# Patient Record
Sex: Male | Born: 1935 | Race: White | Hispanic: No | Smoking: Never smoker
Health system: Southern US, Community
[De-identification: ages and names within clinical notes are randomized; demographics above are authoritative.]

## PROBLEM LIST (undated history)

## (undated) DIAGNOSIS — I480 Paroxysmal atrial fibrillation: Secondary | ICD-10-CM

## (undated) DIAGNOSIS — K219 Gastro-esophageal reflux disease without esophagitis: Secondary | ICD-10-CM

## (undated) DIAGNOSIS — I2510 Atherosclerotic heart disease of native coronary artery without angina pectoris: Secondary | ICD-10-CM

## (undated) DIAGNOSIS — Z860100 Personal history of colon polyps, unspecified: Secondary | ICD-10-CM

## (undated) DIAGNOSIS — E785 Hyperlipidemia, unspecified: Secondary | ICD-10-CM

## (undated) DIAGNOSIS — K5730 Diverticulosis of large intestine without perforation or abscess without bleeding: Secondary | ICD-10-CM

## (undated) DIAGNOSIS — N200 Calculus of kidney: Secondary | ICD-10-CM

## (undated) DIAGNOSIS — I10 Essential (primary) hypertension: Secondary | ICD-10-CM

## (undated) DIAGNOSIS — K649 Unspecified hemorrhoids: Secondary | ICD-10-CM

## (undated) DIAGNOSIS — C4339 Malignant melanoma of other parts of face: Secondary | ICD-10-CM

## (undated) HISTORY — PX: CATARACT EXTRACTION W/ INTRAOCULAR LENS  IMPLANT, BILATERAL: SHX1307

## (undated) HISTORY — DX: Paroxysmal atrial fibrillation: I48.0

## (undated) HISTORY — DX: Unspecified hemorrhoids: K64.9

## (undated) HISTORY — DX: Personal history of colonic polyps: Z86.010

## (undated) HISTORY — PX: CYSTOSCOPY/RETROGRADE/URETEROSCOPY/STONE EXTRACTION WITH BASKET: SHX5317

## (undated) HISTORY — DX: Gastro-esophageal reflux disease without esophagitis: K21.9

## (undated) HISTORY — PX: MELANOMA EXCISION: SHX5266

## (undated) HISTORY — PX: BLADDER SURGERY: SHX569

## (undated) HISTORY — DX: Calculus of kidney: N20.0

## (undated) HISTORY — DX: Hyperlipidemia, unspecified: E78.5

## (undated) HISTORY — PX: VASECTOMY: SHX75

## (undated) HISTORY — DX: Diverticulosis of large intestine without perforation or abscess without bleeding: K57.30

## (undated) HISTORY — DX: Essential (primary) hypertension: I10

## (undated) HISTORY — DX: Personal history of colon polyps, unspecified: Z86.0100

---

## 1990-02-06 HISTORY — PX: LAPAROSCOPIC CHOLECYSTECTOMY: SUR755

## 1990-04-08 HISTORY — PX: INGUINAL HERNIA REPAIR: SUR1180

## 2000-02-07 ENCOUNTER — Encounter: Payer: Self-pay | Admitting: Emergency Medicine

## 2000-02-07 ENCOUNTER — Emergency Department (HOSPITAL_COMMUNITY): Admission: EM | Admit: 2000-02-07 | Discharge: 2000-02-07 | Payer: Self-pay | Admitting: Emergency Medicine

## 2000-02-18 ENCOUNTER — Ambulatory Visit (HOSPITAL_COMMUNITY): Admission: RE | Admit: 2000-02-18 | Discharge: 2000-02-18 | Payer: Self-pay | Admitting: Urology

## 2000-02-18 ENCOUNTER — Encounter: Payer: Self-pay | Admitting: Urology

## 2000-03-15 ENCOUNTER — Inpatient Hospital Stay (HOSPITAL_COMMUNITY): Admission: EM | Admit: 2000-03-15 | Discharge: 2000-03-16 | Payer: Self-pay | Admitting: Emergency Medicine

## 2000-03-15 ENCOUNTER — Encounter: Payer: Self-pay | Admitting: Urology

## 2001-07-04 ENCOUNTER — Other Ambulatory Visit: Admission: RE | Admit: 2001-07-04 | Discharge: 2001-07-04 | Payer: Self-pay | Admitting: Gastroenterology

## 2001-07-04 ENCOUNTER — Encounter (INDEPENDENT_AMBULATORY_CARE_PROVIDER_SITE_OTHER): Payer: Self-pay | Admitting: *Deleted

## 2001-08-04 ENCOUNTER — Encounter: Payer: Self-pay | Admitting: Family Medicine

## 2001-08-04 ENCOUNTER — Ambulatory Visit (HOSPITAL_COMMUNITY): Admission: RE | Admit: 2001-08-04 | Discharge: 2001-08-04 | Payer: Self-pay | Admitting: Family Medicine

## 2001-10-27 ENCOUNTER — Emergency Department (HOSPITAL_COMMUNITY): Admission: EM | Admit: 2001-10-27 | Discharge: 2001-10-27 | Payer: Self-pay | Admitting: Emergency Medicine

## 2002-06-05 ENCOUNTER — Encounter: Payer: Self-pay | Admitting: Gastroenterology

## 2002-06-05 ENCOUNTER — Ambulatory Visit (HOSPITAL_COMMUNITY): Admission: RE | Admit: 2002-06-05 | Discharge: 2002-06-05 | Payer: Self-pay | Admitting: Gastroenterology

## 2003-10-14 ENCOUNTER — Encounter (INDEPENDENT_AMBULATORY_CARE_PROVIDER_SITE_OTHER): Payer: Self-pay | Admitting: Specialist

## 2003-10-14 ENCOUNTER — Ambulatory Visit (HOSPITAL_BASED_OUTPATIENT_CLINIC_OR_DEPARTMENT_OTHER): Admission: RE | Admit: 2003-10-14 | Discharge: 2003-10-14 | Payer: Self-pay | Admitting: Urology

## 2003-10-14 ENCOUNTER — Ambulatory Visit (HOSPITAL_COMMUNITY): Admission: RE | Admit: 2003-10-14 | Discharge: 2003-10-14 | Payer: Self-pay | Admitting: Urology

## 2004-02-07 ENCOUNTER — Emergency Department (HOSPITAL_COMMUNITY): Admission: EM | Admit: 2004-02-07 | Discharge: 2004-02-07 | Payer: Self-pay | Admitting: Emergency Medicine

## 2004-02-22 ENCOUNTER — Encounter: Admission: RE | Admit: 2004-02-22 | Discharge: 2004-02-22 | Payer: Self-pay | Admitting: Family Medicine

## 2005-07-15 ENCOUNTER — Ambulatory Visit: Payer: Self-pay | Admitting: Family Medicine

## 2005-08-26 ENCOUNTER — Ambulatory Visit: Payer: Self-pay | Admitting: Cardiology

## 2005-09-27 ENCOUNTER — Ambulatory Visit: Payer: Self-pay

## 2005-11-19 ENCOUNTER — Ambulatory Visit: Payer: Self-pay | Admitting: Family Medicine

## 2006-02-28 ENCOUNTER — Ambulatory Visit: Payer: Self-pay | Admitting: Family Medicine

## 2006-03-08 ENCOUNTER — Ambulatory Visit: Payer: Self-pay | Admitting: Family Medicine

## 2006-03-16 ENCOUNTER — Ambulatory Visit: Payer: Self-pay | Admitting: Family Medicine

## 2006-05-17 ENCOUNTER — Inpatient Hospital Stay (HOSPITAL_COMMUNITY): Admission: EM | Admit: 2006-05-17 | Discharge: 2006-05-17 | Payer: Self-pay | Admitting: Emergency Medicine

## 2006-05-17 ENCOUNTER — Ambulatory Visit: Payer: Self-pay | Admitting: *Deleted

## 2006-05-24 ENCOUNTER — Ambulatory Visit: Payer: Self-pay | Admitting: Cardiology

## 2006-07-18 ENCOUNTER — Ambulatory Visit: Payer: Self-pay | Admitting: Family Medicine

## 2006-08-25 ENCOUNTER — Ambulatory Visit: Payer: Self-pay | Admitting: Cardiology

## 2007-03-18 ENCOUNTER — Emergency Department (HOSPITAL_COMMUNITY): Admission: EM | Admit: 2007-03-18 | Discharge: 2007-03-18 | Payer: Self-pay | Admitting: Emergency Medicine

## 2007-03-20 ENCOUNTER — Ambulatory Visit: Payer: Self-pay | Admitting: Cardiology

## 2007-06-05 ENCOUNTER — Ambulatory Visit: Payer: Self-pay | Admitting: Internal Medicine

## 2007-06-15 ENCOUNTER — Encounter: Payer: Self-pay | Admitting: Internal Medicine

## 2007-06-15 ENCOUNTER — Ambulatory Visit: Payer: Self-pay | Admitting: Internal Medicine

## 2007-07-04 DIAGNOSIS — J309 Allergic rhinitis, unspecified: Secondary | ICD-10-CM | POA: Insufficient documentation

## 2007-07-04 DIAGNOSIS — E785 Hyperlipidemia, unspecified: Secondary | ICD-10-CM | POA: Insufficient documentation

## 2007-07-04 DIAGNOSIS — I10 Essential (primary) hypertension: Secondary | ICD-10-CM | POA: Insufficient documentation

## 2007-08-01 ENCOUNTER — Ambulatory Visit: Payer: Self-pay | Admitting: Family Medicine

## 2007-08-01 DIAGNOSIS — N138 Other obstructive and reflux uropathy: Secondary | ICD-10-CM | POA: Insufficient documentation

## 2007-08-01 DIAGNOSIS — N401 Enlarged prostate with lower urinary tract symptoms: Secondary | ICD-10-CM

## 2007-08-01 DIAGNOSIS — Z86010 Personal history of colonic polyps: Secondary | ICD-10-CM | POA: Insufficient documentation

## 2007-08-01 DIAGNOSIS — K219 Gastro-esophageal reflux disease without esophagitis: Secondary | ICD-10-CM | POA: Insufficient documentation

## 2007-08-01 DIAGNOSIS — Z87442 Personal history of urinary calculi: Secondary | ICD-10-CM | POA: Insufficient documentation

## 2007-08-14 ENCOUNTER — Encounter: Payer: Self-pay | Admitting: Family Medicine

## 2007-09-11 ENCOUNTER — Ambulatory Visit: Payer: Self-pay | Admitting: Cardiology

## 2007-12-08 ENCOUNTER — Ambulatory Visit: Payer: Self-pay | Admitting: Cardiology

## 2008-05-14 ENCOUNTER — Ambulatory Visit: Payer: Self-pay | Admitting: Cardiology

## 2008-07-16 ENCOUNTER — Ambulatory Visit: Payer: Self-pay | Admitting: Family Medicine

## 2008-08-01 ENCOUNTER — Ambulatory Visit: Payer: Self-pay | Admitting: Family Medicine

## 2008-09-03 ENCOUNTER — Encounter: Payer: Self-pay | Admitting: Family Medicine

## 2009-01-18 ENCOUNTER — Ambulatory Visit: Payer: Self-pay | Admitting: Family Medicine

## 2009-01-18 DIAGNOSIS — J069 Acute upper respiratory infection, unspecified: Secondary | ICD-10-CM | POA: Insufficient documentation

## 2009-07-02 ENCOUNTER — Ambulatory Visit: Payer: Self-pay | Admitting: Cardiology

## 2009-07-02 DIAGNOSIS — I6529 Occlusion and stenosis of unspecified carotid artery: Secondary | ICD-10-CM | POA: Insufficient documentation

## 2009-07-04 ENCOUNTER — Encounter: Payer: Self-pay | Admitting: Cardiology

## 2009-07-04 ENCOUNTER — Ambulatory Visit: Payer: Self-pay

## 2009-08-11 ENCOUNTER — Ambulatory Visit: Payer: Self-pay | Admitting: Family Medicine

## 2009-08-14 ENCOUNTER — Ambulatory Visit: Payer: Self-pay | Admitting: Family Medicine

## 2009-08-19 LAB — CONVERTED CEMR LAB
AST: 22 units/L (ref 0–37)
Alkaline Phosphatase: 54 units/L (ref 39–117)
Basophils Absolute: 0 10*3/uL (ref 0.0–0.1)
Calcium: 9.4 mg/dL (ref 8.4–10.5)
GFR calc non Af Amer: 68.79 mL/min (ref >60)
Glucose, Bld: 101 mg/dL — ABNORMAL HIGH (ref 70–99)
HDL: 35.5 mg/dL — ABNORMAL LOW (ref >39.00)
Hemoglobin: 13.5 g/dL (ref 13.0–17.0)
LDL Cholesterol: 85 mg/dL (ref 0–99)
Lymphocytes Relative: 24.7 % (ref 12.0–46.0)
Monocytes Relative: 9.9 % (ref 3.0–12.0)
Neutro Abs: 4.3 10*3/uL (ref 1.4–7.7)
Platelets: 180 10*3/uL (ref 150.0–400.0)
RDW: 12.9 % (ref 11.5–14.6)
Sodium: 146 meq/L — ABNORMAL HIGH (ref 135–145)
Total Bilirubin: 0.8 mg/dL (ref 0.3–1.2)
VLDL: 12.6 mg/dL (ref 0.0–40.0)
WBC: 7 10*3/uL (ref 4.5–10.5)

## 2009-09-18 ENCOUNTER — Encounter (INDEPENDENT_AMBULATORY_CARE_PROVIDER_SITE_OTHER): Payer: Self-pay | Admitting: *Deleted

## 2009-12-19 ENCOUNTER — Encounter: Payer: Self-pay | Admitting: Family Medicine

## 2010-07-06 ENCOUNTER — Encounter: Payer: Self-pay | Admitting: Cardiology

## 2010-07-08 ENCOUNTER — Ambulatory Visit: Payer: Self-pay | Admitting: Cardiology

## 2010-08-17 ENCOUNTER — Ambulatory Visit: Payer: Self-pay | Admitting: Family Medicine

## 2010-08-18 LAB — CONVERTED CEMR LAB
Albumin: 4 g/dL (ref 3.5–5.2)
Alkaline Phosphatase: 48 units/L (ref 39–117)
Basophils Relative: 0.2 % (ref 0.0–3.0)
CO2: 34 meq/L — ABNORMAL HIGH (ref 19–32)
Calcium: 9.1 mg/dL (ref 8.4–10.5)
Eosinophils Absolute: 0.5 10*3/uL (ref 0.0–0.7)
Glucose, Bld: 94 mg/dL (ref 70–99)
HDL: 32.1 mg/dL — ABNORMAL LOW (ref >39.00)
Hemoglobin: 12.7 g/dL — ABNORMAL LOW (ref 13.0–17.0)
MCHC: 34 g/dL (ref 30.0–36.0)
MCV: 97 fL (ref 78.0–100.0)
Monocytes Absolute: 0.9 10*3/uL (ref 0.1–1.0)
Neutro Abs: 5.9 10*3/uL (ref 1.4–7.7)
RBC: 3.85 M/uL — ABNORMAL LOW (ref 4.22–5.81)
Sodium: 142 meq/L (ref 135–145)
Total Protein: 6.3 g/dL (ref 6.0–8.3)
Triglycerides: 143 mg/dL (ref 0.0–149.0)

## 2010-09-18 ENCOUNTER — Ambulatory Visit: Payer: Self-pay | Admitting: Family Medicine

## 2010-09-18 DIAGNOSIS — J209 Acute bronchitis, unspecified: Secondary | ICD-10-CM | POA: Insufficient documentation

## 2010-09-29 ENCOUNTER — Telehealth: Payer: Self-pay | Admitting: Cardiology

## 2010-10-29 ENCOUNTER — Emergency Department (HOSPITAL_COMMUNITY)
Admission: EM | Admit: 2010-10-29 | Discharge: 2010-10-29 | Payer: Self-pay | Source: Home / Self Care | Admitting: Emergency Medicine

## 2010-12-06 LAB — CONVERTED CEMR LAB
ALT: 23 units/L (ref 0–53)
ALT: 26 units/L (ref 0–53)
AST: 22 units/L (ref 0–37)
AST: 23 units/L (ref 0–37)
Alkaline Phosphatase: 49 units/L (ref 39–117)
Alkaline Phosphatase: 61 units/L (ref 39–117)
BUN: 15 mg/dL (ref 6–23)
Basophils Absolute: 0 10*3/uL (ref 0.0–0.1)
Basophils Relative: 0.6 % (ref 0.0–1.0)
Bilirubin, Direct: 0.1 mg/dL (ref 0.0–0.3)
CO2: 31 meq/L (ref 19–32)
CO2: 32 meq/L (ref 19–32)
Calcium: 9 mg/dL (ref 8.4–10.5)
Calcium: 9.5 mg/dL (ref 8.4–10.5)
Chloride: 102 meq/L (ref 96–112)
Chloride: 105 meq/L (ref 96–112)
Cholesterol: 145 mg/dL (ref 0–200)
Eosinophils Relative: 2.3 % (ref 0.0–5.0)
GFR calc Af Amer: 84 mL/min
GFR calc non Af Amer: 69 mL/min
Glucose, Bld: 100 mg/dL — ABNORMAL HIGH (ref 70–99)
Glucose, Bld: 113 mg/dL — ABNORMAL HIGH (ref 70–99)
HDL: 32 mg/dL — ABNORMAL LOW (ref >39.0)
LDL Cholesterol: 77 mg/dL (ref 0–99)
LDL Cholesterol: 92 mg/dL (ref 0–99)
Lymphocytes Relative: 26.4 % (ref 12.0–46.0)
Lymphocytes Relative: 26.8 % (ref 12.0–46.0)
MCHC: 34.4 g/dL (ref 30.0–36.0)
Monocytes Relative: 10.8 % (ref 3.0–11.0)
Monocytes Relative: 8.6 % (ref 3.0–12.0)
Neutro Abs: 5.1 10*3/uL (ref 1.4–7.7)
Neutrophils Relative %: 60.8 % (ref 43.0–77.0)
Platelets: 175 10*3/uL (ref 150–400)
Platelets: 177 10*3/uL (ref 150–400)
Potassium: 4.2 meq/L (ref 3.5–5.1)
RBC: 4.2 M/uL — ABNORMAL LOW (ref 4.22–5.81)
RDW: 12.8 % (ref 11.5–14.6)
Sodium: 144 meq/L (ref 135–145)
TSH: 1.11 microintl units/mL (ref 0.35–5.50)
Total Bilirubin: 0.9 mg/dL (ref 0.3–1.2)
Total CHOL/HDL Ratio: 3.7
Total CHOL/HDL Ratio: 4.2
Total Protein: 6.5 g/dL (ref 6.0–8.3)
Triglycerides: 127 mg/dL (ref 0–149)
Triglycerides: 67 mg/dL (ref 0–149)
VLDL: 13 mg/dL (ref 0–40)
VLDL: 25 mg/dL (ref 0–40)
WBC: 8.5 10*3/uL (ref 4.5–10.5)

## 2010-12-08 NOTE — Miscellaneous (Signed)
  Clinical Lists Changes  Observations: Added new observation of US CAROTID: NOrmal carotid artery disease, bilaterally (07/04/2009 9:21)      Carotid Doppler  Procedure date:  07/04/2009  Findings:      NOrmal carotid artery disease, bilaterally

## 2010-12-08 NOTE — Assessment & Plan Note (Signed)
Summary: COUGH, CONGESTION // RS   Vital Signs:  Patient profile:   77 year old male Weight:      212 pounds O2 Sat:      95 % Temp:     98.1 degrees F Pulse rate:   62 / minute BP sitting:   120 / 72  (left arm)  Vitals Entered By: Pura Spice, RN (September 18, 2010 9:18 AM) CC: congestion  cough productive    History of Present Illness: Here for one week of PND, chest congestion, and coughing up green sputum. No fever. ON Mucinex.  Allergies: 1)  ! Sulfamethoxazole (Sulfamethoxazole)  Past History:  Past Medical History: Reviewed history from 08/17/2010 and no changes required. Allergic rhinitis Hyperlipidemia Hypertension low back pain (Dr. Gerlene Fee) paroxysmal Atrial fibrillation (cardiologist is Dr. Antoine Poche) GERD Glaucoma (sees Dr. Nile Riggs) sees Dr. Lonni Fix for dermatology checks sees Dr. Vonita Moss for urologic exams Colonic polyps, hx  Nephrolithiasis, hx of  Past Surgical History: Reviewed history from 08/17/2010 and no changes required. Cholecystectomy Vasectomy Rt hernia repaired, inguinal Laser surgery both  eyes (catarracts out)  Ruptured blood vessel in the bladder Removed 2 stones left  ureter Repair blood vessel in bladder colonoscopy 06-15-07 per Dr. Leone Payor, repeat in 5 yrs  Review of Systems  The patient denies anorexia, fever, weight loss, weight gain, vision loss, decreased hearing, hoarseness, chest pain, syncope, dyspnea on exertion, peripheral edema, headaches, hemoptysis, abdominal pain, melena, hematochezia, severe indigestion/heartburn, hematuria, incontinence, genital sores, muscle weakness, suspicious skin lesions, transient blindness, difficulty walking, depression, unusual weight change, abnormal bleeding, enlarged lymph nodes, angioedema, breast masses, and testicular masses.    Physical Exam  General:  Well-developed,well-nourished,in no acute distress; alert,appropriate and cooperative throughout examination Head:  Normocephalic and  atraumatic without obvious abnormalities. No apparent alopecia or balding. Eyes:  No corneal or conjunctival inflammation noted. EOMI. Perrla. Funduscopic exam benign, without hemorrhages, exudates or papilledema. Vision grossly normal. Ears:  External ear exam shows no significant lesions or deformities.  Otoscopic examination reveals clear canals, tympanic membranes are intact bilaterally without bulging, retraction, inflammation or discharge. Hearing is grossly normal bilaterally. Nose:  External nasal examination shows no deformity or inflammation. Nasal mucosa are pink and moist without lesions or exudates. Mouth:  Oral mucosa and oropharynx without lesions or exudates.  Teeth in good repair. Neck:  No deformities, masses, or tenderness noted. Lungs:  scattered rhonchi   Impression & Recommendations:  Problem # 1:  ACUTE BRONCHITIS (ICD-466.0)  His updated medication list for this problem includes:    Zithromax Z-pak 250 Mg Tabs (Azithromycin) .Marland Kitchen... As directed  Complete Medication List: 1)  Travatan 0.004 % Soln (Travoprost) .Marland Kitchen.. 1 drop in each eye once daily 2)  Metoprolol Tartrate 50 Mg Tabs (Metoprolol tartrate) .... Two times a day 3)  Simvastatin 40 Mg Tabs (Simvastatin) .... Take 1 tablet by mouth once a day 4)  Loratadine 10 Mg Tabs (Loratadine) .... Take 1 tablet by mouth once a day 5)  Lisinopril 10 Mg Tabs (Lisinopril) .... Take 1 tablet by mouth once a day 6)  K-tabs 10 Meq Tbcr (Potassium chloride) .... Take 1 tablet by mouth once a day 7)  Aspirin 325 Mg Tbec (Aspirin) .... Once daily 8)  Centrum Silver Tabs (Multiple vitamins-minerals) .... Once daily 9)  Metamucil 30.9 % Powd (Psyllium) .... Two times a day 10)  Omeprazole 20 Mg Tbec (Omeprazole) .... 2 once daily 11)  Flonase 50 Mcg/act Susp (Fluticasone propionate) .... Once daily 12)  Zyrtec  Allergy 10 Mg Caps (Cetirizine hcl) 13)  Zithromax Z-pak 250 Mg Tabs (Azithromycin) .... As directed  Patient  Instructions: 1)  Please schedule a follow-up appointment as needed .  Prescriptions: ZITHROMAX Z-PAK 250 MG TABS (AZITHROMYCIN) as directed  #1 x 0   Entered and Authorized by:   Nelwyn Salisbury MD   Signed by:   Nelwyn Salisbury MD on 09/18/2010   Method used:   Electronically to        [facility]* (retail)       [address] [place], [place]  [address]       Ph: [phone] or [phone]       Fax: [phone]   RxID:   [id number]    Orders Added: 1)  Est. Patient Level IV [10932]

## 2010-12-08 NOTE — Assessment & Plan Note (Signed)
Summary: EMP/PT FASTING/CJR   Vital Signs:  Patient profile:   77 year old male Height:      70.5 inches Weight:      216 pounds BMI:     30.67 O2 Sat:      95 % Temp:     98.7 degrees F Pulse rate:   71 / minute BP sitting:   120 / 70  (left arm)  Vitals Entered By: Pura Spice, RN (August 17, 2010 8:56 AM) CC: cpx no problems  Is Patient Diabetic? No   History of Present Illness: 77 yr old male for a cpx. He feels fine and has no concerns. He saw Dr. Antoine Poche in August and got a good report. He had a single 5 hour run of what was probably atrial fib in early August, but none since. He remains active. he plans to see Dr. Vonita Moss next month.   Allergies: 1)  ! Sulfamethoxazole (Sulfamethoxazole)  Past History:  Past Medical History: Allergic rhinitis Hyperlipidemia Hypertension low back pain (Dr. Gerlene Fee) paroxysmal Atrial fibrillation (cardiologist is Dr. Antoine Poche) GERD Glaucoma (sees Dr. Nile Riggs) sees Dr. Lonni Fix for dermatology checks sees Dr. Vonita Moss for urologic exams Colonic polyps, hx  Nephrolithiasis, hx of  Past Surgical History: Cholecystectomy Vasectomy Rt hernia repaired, inguinal Laser surgery both  eyes (catarracts out)  Ruptured blood vessel in the bladder Removed 2 stones left  ureter Repair blood vessel in bladder colonoscopy 06-15-07 per Dr. Leone Payor, repeat in 5 yrs  Past History:  Care Management: Cardiology: Dr Antoine Poche  Dermatology: Dr Terri Piedra Urology: Dr Vonita Moss Ophthalmology: Dr Nile Riggs Gastroenterology: Dr Leone Payor  Family History: Reviewed history from 07/02/2009 and no changes required.  Father died from a myocardial infarction at age of 77.  Otherwise there is no early history of coronary artery disease in hiis  family.  Social History: Reviewed history from 06/30/2009 and no changes required.  The patient lives in [place] with his wife. He has two  children.  He is retired.  He denies any tobacco, alcohol, or illicit  substance.  Review of Systems  The patient denies anorexia, fever, weight loss, weight gain, vision loss, decreased hearing, hoarseness, chest pain, syncope, dyspnea on exertion, peripheral edema, prolonged cough, headaches, hemoptysis, abdominal pain, melena, hematochezia, severe indigestion/heartburn, hematuria, incontinence, genital sores, muscle weakness, suspicious skin lesions, transient blindness, difficulty walking, depression, unusual weight change, abnormal bleeding, enlarged lymph nodes, angioedema, breast masses, and testicular masses.         Flu Vaccine Consent Questions     Do you have a history of severe allergic reactions to this vaccine? no    Any prior history of allergic reactions to egg and/or gelatin? no    Do you have a sensitivity to the preservative Thimersol? no    Do you have a past history of Guillan-Barre Syndrome? no    Do you currently have an acute febrile illness? no    Have you ever had a severe reaction to latex? no    Vaccine information given and explained to patient? yes    Are you currently pregnant? no    Lot Number:[id number]   Exp Date:05/08/2011   Site Given  Left Deltoid IM Pura Spice, RN  August 17, 2010 9:00 AM   Physical Exam  General:  overweight-appearing.   Head:  Normocephalic and atraumatic without obvious abnormalities. No apparent alopecia or balding. Eyes:  No corneal or conjunctival inflammation noted. EOMI. Perrla. Funduscopic exam benign, without hemorrhages, exudates or papilledema. Vision grossly  normal. Ears:  External ear exam shows no significant lesions or deformities.  Otoscopic examination reveals clear canals, tympanic membranes are intact bilaterally without bulging, retraction, inflammation or discharge. Hearing is grossly normal bilaterally. Nose:  External nasal examination shows no deformity or inflammation. Nasal mucosa are pink and moist without lesions or exudates. Mouth:  Oral mucosa and oropharynx without  lesions or exudates.  Teeth in good repair. Neck:  No deformities, masses, or tenderness noted. Chest Wall:  No deformities, masses, tenderness or gynecomastia noted. Lungs:  Normal respiratory effort, chest expands symmetrically. Lungs are clear to auscultation, no crackles or wheezes. Heart:  Normal rate and regular rhythm. S1 and S2 normal without gallop, murmur, click, rub or other extra sounds. Abdomen:  Bowel sounds positive,abdomen soft and non-tender without masses, organomegaly or hernias noted. Msk:  No deformity or scoliosis noted of thoracic or lumbar spine.   Pulses:  R and L carotid,radial,femoral,dorsalis pedis and posterior tibial pulses are full and equal bilaterally Extremities:  No clubbing, cyanosis, edema, or deformity noted with normal full range of motion of all joints.   Neurologic:  No cranial nerve deficits noted. Station and gait are normal. Plantar reflexes are down-going bilaterally. DTRs are symmetrical throughout. Sensory, motor and coordinative functions appear intact. Skin:  Intact without suspicious lesions or rashes Cervical Nodes:  No lymphadenopathy noted Axillary Nodes:  No palpable lymphadenopathy Inguinal Nodes:  No significant adenopathy Psych:  Cognition and judgment appear intact. Alert and cooperative with normal attention span and concentration. No apparent delusions, illusions, hallucinations   Impression & Recommendations:  Problem # 1:  CAROTID ARTERY STENOSIS (ICD-433.10)  His updated medication list for this problem includes:    Aspirin 325 Mg Tbec (Aspirin) ..... Once daily  Problem # 2:  BENIGN PROSTATIC HYPERTROPHY, HX OF (ICD-V13.8)  Orders: T-PSA Total ([address])  Problem # 3:  GERD (ICD-530.81)  His updated medication list for this problem includes:    Omeprazole 20 Mg Tbec (Omeprazole) .Marland Kitchen... 2 once daily  Problem # 4:  ATRIAL FIBRILLATION (ICD-427.31)  His updated medication list for this problem includes:    Metoprolol  Tartrate 50 Mg Tabs (Metoprolol tartrate) .Marland Kitchen..Marland Kitchen Two times a day    Aspirin 325 Mg Tbec (Aspirin) ..... Once daily  Problem # 5:  HYPERTENSION (ICD-401.9)  His updated medication list for this problem includes:    Metoprolol Tartrate 50 Mg Tabs (Metoprolol tartrate) .Marland Kitchen..Marland Kitchen Two times a day    Lisinopril 10 Mg Tabs (Lisinopril) .Marland Kitchen... Take 1 tablet by mouth once a day  Orders: UA Dipstick w/o Micro (automated)  (81003) Venipuncture (40981) TLB-Lipid Panel (80061-LIPID) TLB-BMP (Basic Metabolic Panel-BMET) (80048-METABOL) TLB-CBC Platelet - w/Differential (85025-CBCD) TLB-Hepatic/Liver Function Pnl (80076-HEPATIC) TLB-TSH (Thyroid Stimulating Hormone) (84443-TSH)  Problem # 6:  HYPERLIPIDEMIA (ICD-272.4)  His updated medication list for this problem includes:    Simvastatin 40 Mg Tabs (Simvastatin) .Marland Kitchen... Take 1 tablet by mouth once a day  Complete Medication List: 1)  Travatan 0.004 % Soln (Travoprost) .Marland Kitchen.. 1 drop in each eye once daily 2)  Metoprolol Tartrate 50 Mg Tabs (Metoprolol tartrate) .... Two times a day 3)  Simvastatin 40 Mg Tabs (Simvastatin) .... Take 1 tablet by mouth once a day 4)  Loratadine 10 Mg Tabs (Loratadine) .... Take 1 tablet by mouth once a day 5)  Lisinopril 10 Mg Tabs (Lisinopril) .... Take 1 tablet by mouth once a day 6)  K-tabs 10 Meq Tbcr (Potassium chloride) .... Take 1 tablet by mouth once a day 7)  Aspirin 325 Mg Tbec (Aspirin) .... Once daily 8)  Centrum Silver Tabs (Multiple vitamins-minerals) .... Once daily 9)  Metamucil 30.9 % Powd (Psyllium) .... Two times a day 10)  Omeprazole 20 Mg Tbec (Omeprazole) .... 2 once daily 11)  Flonase 50 Mcg/act Susp (Fluticasone propionate) .... Once daily 12)  Zyrtec Allergy 10 Mg Caps (Cetirizine hcl)  Other Orders: Flu Vaccine 54yrs + MEDICARE PATIENTS (V4259) Administration Flu vaccine - MCR (D6387)  Patient Instructions: 1)  get fasting labs. 2)  It is important that you exercise reguarly at least 20  minutes 5 times a week. If you develop chest pain, have severe difficulty breathing, or feel very tired, stop exercising immediately and seek medical attention.  3)  You need to lose weight. Consider a lower calorie diet and regular exercise.    Eye Exam  Last  Eye exam   July 2011 goes every 3 months  Dr Nile Riggs    Appended Document: Orders Update     Clinical Lists Changes  Observations: Added new observation of COMMENTS: Wynona Canes, CMA  August 17, 2010 11:27 AM  (08/17/2010 11:26) Added new observation of PH URINE: 5.0  (08/17/2010 11:26) Added new observation of SPEC GR URIN: 1.025  (08/17/2010 11:26) Added new observation of APPEARANCE U: Clear  (08/17/2010 11:26) Added new observation of UA COLOR: yellow  (08/17/2010 11:26) Added new observation of WBC DIPSTK U: negative  (08/17/2010 11:26) Added new observation of NITRITE URN: negative  (08/17/2010 11:26) Added new observation of UROBILINOGEN: 0.2  (08/17/2010 11:26) Added new observation of PROTEIN, URN: negative  (08/17/2010 11:26) Added new observation of BLOOD UR DIP: trace-lysed  (08/17/2010 11:26) Added new observation of KETONES URN: negative  (08/17/2010 11:26) Added new observation of BILIRUBIN UR: negative  (08/17/2010 11:26) Added new observation of GLUCOSE, URN: negative  (08/17/2010 11:26)      Laboratory Results   Urine Tests  Date/Time Recieved: August 17, 2010 11:27 AM  Date/Time Reported: August 17, 2010 11:27 AM   Routine Urinalysis   Color: yellow Appearance: Clear Glucose: negative   (Normal Range: Negative) Bilirubin: negative   (Normal Range: Negative) Ketone: negative   (Normal Range: Negative) Spec. Gravity: 1.025   (Normal Range: 1.003-1.035) Blood: trace-lysed   (Normal Range: Negative) pH: 5.0   (Normal Range: 5.0-8.0) Protein: negative   (Normal Range: Negative) Urobilinogen: 0.2   (Normal Range: 0-1) Nitrite: negative   (Normal Range: Negative) Leukocyte Esterace:  negative   (Normal Range: Negative)    Comments: Wynona Canes, CMA  August 17, 2010 11:27 AM

## 2010-12-08 NOTE — Progress Notes (Signed)
Summary: c/o afib    Phone Note Call from Patient Call back at [phone]- cell phone    Caller: [phone] Reason for Call: Talk to Nurse Details for Reason: c/o afib since last night. start 6pm on yesterday.  Initial call taken by: Lorne Skeens,  September 29, 2010 8:15 AM  Follow-up for Phone Call        was out cleaning up leaves yesterday - started yesterday afternoon and lasted through the night and into the AM  stopped this AM about 7:30 on its own.   No dizziness or chest pain.  Wants to let Dr Antoine Poche know.  Pt aware I will notify Dr Antoine Poche and call him back with any orders.  He is in agreement Follow-up by: Charolotte Capuchin, RN,  September 29, 2010 9:27 AM  Additional Follow-up for Phone Call Additional follow up Details #1::        If he has increasing palpitations he would need an event monitor.  However, they need to be occuring frequently enough to be captured in a 21 day period. Additional Follow-up by: [name], MD, [facility],  September 29, 2010 11:58 AM    Additional Follow-up for Phone Call Additional follow up Details #2::    pt aware that we can place a monitor but he says it only occurs about once or twice a year.  Instructed pt to call if afib re-occurs.  pt states understanding Follow-up by: Charolotte Capuchin, RN,  September 29, 2010 12:44 PM

## 2010-12-08 NOTE — Consult Note (Signed)
Summary: [facility]  [facility]   Imported By: Maryln Gottron 01/22/2010 13:34:52  _____________________________________________________________________  External Attachment:    Type:   Image     Comment:   External Document

## 2010-12-08 NOTE — Assessment & Plan Note (Signed)
Summary: 1 yr rov      Allergies Added:   Visit Type:  Follow-up Primary Provider:  Nelwyn Salisbury MD  CC:  Atrial Fibrillation.  History of Present Illness: The patient presents for followup of atrial fibrillation. Recently his wife was in the hospital. During all of this stress he thinks he had an episode of atrial fibrillation lasting about 5 hours. It resolved spontaneously and he did not seek evaluation. Other than that he's had no tachycardia palpitations. He's had no chest discomfort, neck or arm discomfort. He's had no shortness of breath, PND or orthopnea. His blood pressure is up slightly today but he thinks this is an aberration.  Current Medications (verified): 1)  Travatan 0.004 %  Soln (Travoprost) .Marland Kitchen.. 1 Drop in Each Eye Once Daily 2)  Metoprolol Tartrate 50 Mg  Tabs (Metoprolol Tartrate) .... Two Times A Day 3)  Simvastatin 40 Mg  Tabs (Simvastatin) .... Take 1 Tablet By Mouth Once A Day 4)  Loratadine 10 Mg  Tabs (Loratadine) .... Take 1 Tablet By Mouth Once A Day 5)  Lisinopril 10 Mg  Tabs (Lisinopril) .... Take 1 Tablet By Mouth Once A Day 6)  K-Tabs 10 Meq  Tbcr (Potassium Chloride) .... Take 1 Tablet By Mouth Once A Day 7)  Aspirin 325 Mg  Tbec (Aspirin) .... Once Daily 8)  Centrum Silver   Tabs (Multiple Vitamins-Minerals) .... Once Daily 9)  Metamucil 30.9 %  Powd (Psyllium) .... Two Times A Day 10)  Omeprazole 20 Mg  Tbec (Omeprazole) .... 2 Once Daily 11)  Flonase 50 Mcg/act  Susp (Fluticasone Propionate) .... Once Daily  Allergies (verified): 1)  ! Sulfamethoxazole (Sulfamethoxazole)  Past History:  Past Medical History: Allergic rhinitis Hyperlipidemia Hypertension low back pain (Dr. Gerlene Fee) Atrial fibrillation (cardiologist is Dr. Antoine Poche) GERD Glaucoma (sees Dr. Nile Riggs) sees Dr. Lonni Fix for dermatology checks sees Dr. Vonita Moss for urologic exams Colonic polyps, hx of , last scope was on 06-15-07 per Dr. Leone Payor, next is due in  2013 Nephrolithiasis, hx of  Past Surgical History: Reviewed history from 08/14/2009 and no changes required. Cholecystectomy Vasectomy Rt hernia repaired, inguinal Laser surgery both  eyes Ruptured blood vessel in the bladder Removed 2 stones left  ureter Repair blood vessel in bladder  Review of Systems       As stated in the HPI and negative for all other systems.   Vital Signs:  Patient profile:   77 year old male Height:      70.5 inches Weight:      208 pounds BMI:     29.53 Pulse rate:   50 / minute Resp:     16 per minute BP sitting:   148 / 72  (right arm)  Vitals Entered By: Marrion Coy, CNA (July 08, 2010 11:20 AM)  Physical Exam  General:  Well developed, well nourished, in no acute distress. Head:  normocephalic and atraumatic Eyes:  PERRLA/EOM intact; conjunctiva and lids normal. Mouth:  Teeth, gums and palate normal. Oral mucosa normal. Neck:  Neck supple, no JVD. No masses, thyromegaly or abnormal cervical nodes. Chest Wall:  no deformities or breast masses noted Lungs:  Clear bilaterally to auscultation and percussion. Heart:  Non-displaced PMI, chest non-tender; regular rate and rhythm, S1, S2 without murmurs, rubs or gallops. Carotid upstroke normal, no bruit. Normal abdominal aortic size, no bruits. Femorals normal pulses, no bruits. Pedals normal pulses. No edema, no varicosities. Abdomen:  Bowel sounds positive; abdomen soft and non-tender without masses,  organomegaly, or hernias noted. No hepatosplenomegaly. Msk:  Back normal, normal gait. Muscle strength and tone normal. Extremities:  No clubbing or cyanosis. Neurologic:  Alert and oriented x 3. Skin:  Intact without lesions or rashes. Cervical Nodes:  no significant adenopathy Psych:  Normal affect.   EKG  Procedure date:  07/08/2010  Findings:      sinus rhythm, rate 50, axis within normal limits, intervals within normal limits, no acute ST-T wave changes  Impression &  Recommendations:  Problem # 1:  ATRIAL FIBRILLATION (ICD-427.31) We had a long discussion today. He has had very rare palpitations which she thinks is his fibrillation. He is very clear when he's in this rhythm symptomatically. He hasn't had an episode in 3 years up until recently. Given this he does not want to start anticoagulation and I think this is reasonable. However, he would let me know if he has any increased tachypalpitations going forward. Orders: EKG w/ Interpretation (93000)  Problem # 2:  CAROTID ARTERY STENOSIS (ICD-433.10) We will follow this up as appropriate.  Problem # 3:  HYPERTENSION (ICD-401.9) His blood pressure is very slightly elevated. He will keep a blood pressure diary. He will exercise more. His blood pressures not at target we will make med adjustments.  Patient Instructions: 1)  Your physician recommends that you schedule a follow-up appointment in: 12 months with Dr Antoine Poche 2)  Your physician recommends that you continue on your current medications as directed. Please refer to the Current Medication list given to you today.

## 2011-01-18 LAB — DIFFERENTIAL
Basophils Absolute: 0 10*3/uL (ref 0.0–0.1)
Basophils Relative: 0 % (ref 0–1)
Lymphocytes Relative: 9 % — ABNORMAL LOW (ref 12–46)
Neutro Abs: 17.9 10*3/uL — ABNORMAL HIGH (ref 1.7–7.7)
Neutrophils Relative %: 83 % — ABNORMAL HIGH (ref 43–77)

## 2011-01-18 LAB — CBC
HCT: 42.4 % (ref 39.0–52.0)
Platelets: 202 10*3/uL (ref 150–400)
RDW: 13.1 % (ref 11.5–15.5)
WBC: 21.5 10*3/uL — ABNORMAL HIGH (ref 4.0–10.5)

## 2011-01-18 LAB — URINALYSIS, ROUTINE W REFLEX MICROSCOPIC
Glucose, UA: NEGATIVE mg/dL
Ketones, ur: NEGATIVE mg/dL
Specific Gravity, Urine: 1.023 (ref 1.005–1.030)
pH: 5.5 (ref 5.0–8.0)

## 2011-01-18 LAB — POCT I-STAT, CHEM 8
BUN: 25 mg/dL — ABNORMAL HIGH (ref 6–23)
Chloride: 106 mEq/L (ref 96–112)
HCT: 45 % (ref 39.0–52.0)
Potassium: 4.3 mEq/L (ref 3.5–5.1)
Sodium: 141 mEq/L (ref 135–145)

## 2011-01-18 LAB — URINE CULTURE: Colony Count: 15000

## 2011-01-18 LAB — URINE MICROSCOPIC-ADD ON

## 2011-03-23 NOTE — Assessment & Plan Note (Signed)
[facility]                                 ON-CALL NOTE   NOUR, SCALISE                         MRN:          [id number]  DATE:03/17/2007                            DOB:          02-23-1929    PRIMARY CARDIOLOGIST:  Dr. Antoine Poche.   Mr. Zeitz was seen in the office for palpitations in October 2007.  He  stated that he is currently taking a beta blocker and today since the  first time he was seen in the office he had palpitations.  He stated he  could tell that his heart was beating rapidly and irregularly.  He  states that he has been compliant with his home dose of metoprolol which  is 50 mg b.i.d. but his wife had some Atenolol with her and he had taken  one of those as well.  I requested that he count his pulse, which he did  twice, and both times his heart rate was less than 80.  He stated he was  asymptomatic and not having any problems with chest pain, shortness of  breath, weakness, presyncope or diaphoresis.  He stated that except for  the fact that he could tell his heart was irregular, he was completely  asymptomatic.  I discussed the situation with him.  I advised him that  he did not need to come to the hospital over the weekend unless he  developed the above symptoms.  He stated he would do so.  I stated that  if he did not have any further symptoms then he could wait until Monday  and I would get our office to contact him and have him come in for an  appointment for evaluation.  He is currently taking a full strength  aspirin a day and I advised him to continue that as well. He had checked  his blood pressure with his home blood pressure machine and it was  within normal limits.      Theodore Demark, PA-C       Pricilla Riffle, MD, [facility]  Electronically Signed   RB/MedQ  DD: 03/17/2007  DT: 03/18/2007  Job #: [id number]

## 2011-03-23 NOTE — Assessment & Plan Note (Signed)
[facility]                            CARDIOLOGY OFFICE NOTE   [name], [name]                       MRN:          [id number]  DATE:12/08/2007                            DOB:          1929-03-27    PRIMARY:  Dr. Gershon [name].   REASON PRESENTATION:  Evaluate patient atrial fibrillation.   HISTORY OF PRESENT ILLNESS:  The patient of his 77 years old.  He had an  episode on the 23rd of atrial fibrillation lasting 22 hours.  This was  his longest.  He was not particularly bothered by it.  Did not cause any  chest discomfort.  He just notices heart rate was irregular.  He did not  have any presyncope or syncope.  He recorded the rate frequently and the  peak was 120.  Was predominantly in the 51s and 38s.  It when away  spontaneously.  He did call our office and we invited him to come to the  to the ER for flecainide pill in pocket approach.  However, he waited  and went away.   He is otherwise been doing well.  Remains active.  He denies any chest  discomfort, neck or arm discomfort.  Had no shortness of breath, PND or  orthopnea.   PAST MEDICAL HISTORY:  Paroxysmal atrial fibrillation, dyslipidemia,  hypertension since 1977, gastroesophageal reflux disease,  nephrolithiasis, herniated disk, skin cancer resected, bladder mass,  laser surgery left eye, hernia repair the right, Prilosec, vasectomy.   ALLERGIES:  SULFA causes rash.   MEDICATIONS:  Travatan, metoprolol 50 mg b.i.d., simvastatin 4 mg daily,  loratadine 10 mg daily, lisinopril 10 mg daily, potassium 10 mg daily,  Centrum, Metamucil, Flonase, aspirin 325 mg daily, omeprazole.   REVIEW OF SYSTEMS:  As stated in the HPI, otherwise negative for other  systems.   PHYSICAL EXAMINATION:  The patient is in no distress.  Blood pressure December 08, 1973, heart 60 and regular, weight 710  pounds.  HEENT:  Eyes are, pupils equal, round, reactive to fundi not visualized,  oral mucosa  normal.  NECK:  No jugular distention 45 degrees carotid upstroke brisk and  symmetrical.  No bruits, thyromegaly.  Lymphatics.  No lymphadenopathy.  LUNGS:  Clear to auscultation bilaterally.  BACK:  No costovertebral angle tenderness.  CHEST:  Unremarkable.  HEART:  PMI not displaced or sustained, S1-S2 within normal limits, no  S3, no S4, no clicks, rubs, no murmurs.  ABDOMEN:  Flat, positive bowel sounds normal in frequency and pitch, no  bruits, rebound, guarding, no midline pulsatile mass.  No  hepatosplenomegaly, splenomegaly.  SKIN:  No rashes, nodules.  EXTREMITIES:  2+ pulse throughout, edema, cyanosis or clubbing.  NEURO:  Oriented person, place, time, cranial nerves II-XII grossly  intact, motor grossly intact throughout.   ASSESSMENT/PLAN:  1. Atrial fibrillation.  The patient had paroxysmal atrial      fibrillation.  We discussed the fact that this happens in the      future and symptomatic and persistent and he might well be treated      with the flecainide  pill in pocket approach.  He has had a normal      stress perfusion study.  His no structural heart disease.  Given      this he be a reasonable candidate for flecainide p.o. bolus.  He      come to the ER to get this.  He did need to be watched in the ER      for 5 hours afterwards for pro arrhythmia.  He could then use this      approach as needed to shorten future paroxysms.  2. Hypertension.  Blood pressure is well-controlled.  Continue      medications as listed.  3. Dyslipidemia per Dr. Clent [name].  4. Follow-up will see the patient again in 6 months or sooner if      needed.     [name] Rotunda, MD, [facility]  Electronically Signed    JH/MedQ  DD: 12/08/2007  DT: 12/08/2007  Job #: [id number]

## 2011-03-23 NOTE — Assessment & Plan Note (Signed)
[facility]                            CARDIOLOGY OFFICE NOTE   [name] [name], FAILS                       MRN:          [id number]  DATE:05/14/2008                            DOB:          02-Aug-1929    PRIMARY CARE PHYSICIAN:  Tera Mater. Clent Ridges, MD   RECENT FOR PRESENTATION:  Evaluate the patient with atrial fibrillation.   HISTORY OF PRESENT ILLNESS:  The patient is a pleasant 77 year old  gentleman with paroxysmal atrial fibrillation.  However, since I last  saw him in January, he has had no further events.  He has had no  palpitations, presyncope, or syncope.  He has had no chest pain or  shortness of breath.  He feels very well.  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  He  has lost about 20 pounds through diet and exercise.   PAST MEDICAL HISTORY:  1. Paroxysmal atrial fibrillation.  2. Dyslipidemia.  3. Hypertension since 1977.  4. Gastroesophageal reflux disease.  5. Nephrolithiasis.  6. Herniated disk.  7. Skin cancer resected.  8. Bladder mass.  9. Laser surgery in the left eye.  10.Hernia repair on the right.  11.Vasectomy.  12.Cholecystectomy.   ALLERGIES:  SULFA causes a rash.   MEDICATIONS:  1. Travatan.  2. Metoprolol 50 mg b.i.d.  3. Simvastatin 40 mg daily.  4. Loratadine 10 mg daily.  5. Lisinopril 10 mg daily.  6. Potassium 10 mEq daily.  7. Centrum.  8. Metamucil.  9. Flonase.  10.Aspirin 325 mg daily.  11.Omeprazole.   REVIEW OF SYSTEMS:  As stated in the HPI and otherwise negative for  other systems.   PHYSICAL EXAMINATION:  GENERAL:  The patient is in no distress.  VITAL SIGNS:  Blood pressure 115/69, heart rate 56 and regular, and  weight 194 pounds.  HEENT:  Eyelids unremarkable, pupils equal round and reactive to light,  fundi not visualized.  NECK:  No jugular venous distention at 45 degrees, carotid upstroke  brisk and symmetrical, no bruits,  no thyromegaly.  LUNGS:  Clear to auscultation bilaterally.  BACK:  No costovertebral angle tenderness.  CHEST:  Unremarkable.  HEART:  PMI not displaced or sustained, S1 and S2 within normal limits,  no S3, no S4, no clicks, no rubs, no murmurs.  ABDOMEN:  Flat, positive bowel sounds, normal in frequency and pitch, no  bruits, no rebound, no guarding, no midline pulsatile mass, no  hepatomegaly, no splenomegaly.  SKIN:  No rashes, no nodules.  EXTREMITIES:  Pulse 2+ throughout, no edema, no cyanosis, no clubbing.  NEUROLOGIC:  Oriented to person, place, and time, cranial nerves II-XII  grossly intact, motor grossly intact throughout.   EKG, sinus bradycardia, rate 56, axes within normal limits, intervals  within normal limits, no acute ST-T wave change.   ASSESSMENT/PLAN:  1. Paroxysmal atrial fibrillation.  The patient is having no symptoms      related to this.  At this point, he will let us know if he has any      further  palpitations.  No further cardiovascular testing is      suggested.  2. Hypertension.  Blood pressure is well controlled and he will      continue the medications as listed.  3. Followup.  I will see the patient again in 12 months or sooner if      needed.     [name], MD, [facility]  Electronically Signed    JH/MedQ  DD: 05/14/2008  DT: 05/15/2008  Job #: [id number]   cc:   Jeannett Senior A. Clent Ridges, MD

## 2011-03-23 NOTE — Assessment & Plan Note (Signed)
[facility]                                 ON-CALL NOTE   [name] [name], [name] [name]                         MRN:          [id number]  DATE:03/18/2007                            DOB:          12-Jan-1929    Kathie Rhodes, called the answering service and I called her back at [phone].  [name] apparently went into atrial fibrillation last night and she  had several conversations with Theodore Demark, PA-C.  At the time, the  patient was comfortable.  However, this morning, he feels more short of  breath.  His heart rate is still in the 70s and irregular, and his blood  pressure is 90/60.  Secondary to his ongoing symptoms of dyspnea, Mrs.  [name] is bringing him into the ED this morning.      Nicolasa Ducking, ANP       [name], MD, [facility]    CB/MedQ  DD: 03/18/2007  DT: 03/18/2007  Job #: [id number]

## 2011-03-23 NOTE — Assessment & Plan Note (Signed)
[facility]                            CARDIOLOGY OFFICE NOTE   COBEN, GODSHALL                       MRN:          [id number]  DATE:09/11/2007                            DOB:          [birth year]    PRIMARY CARE PHYSICIAN:  Dr. [name].   REASON FOR PRESENTATION:  Evaluate patient with atrial fibrillation.   HISTORY OF PRESENT ILLNESS:  The patient presents for followup of the  above.  He has a history of paroxysmal atrial fibrillation.  Since I  last saw him, he has had 1 episode lasting 11 hours.  He is absolutely  convinced that he understands exactly when this starts and exactly when  it terminates.  He feels it is an irregular heartbeat.  He was not  particularly bothered by this one, however.  He did take an apparent  dose of his wife's atenolol in addition to his other medications.  He  was not short of breath.  He did not have any chest pain.  He did not  have any presyncope or syncope.  He was able to go about his activities.  It terminated spontaneously.   He, otherwise, has done well.  He has been walking a few miles a day.  With this he denies any chest discomfort, neck discomfort, arm  discomfort, activity-induced nausea, vomiting, excessive diaphoresis.  He has had no palpitations, presyncope, or syncope.  He denies any PND  or orthopnea.   PAST MEDICAL HISTORY:  1. Paroxysmal atrial fibrillation.  2. Dyslipidemia.  3. Hypertension since 1977.  4. Gastroesophageal reflux disease.  5. Nephrolithiasis.  6. Herniated disk.  7. Skin cancer, resected.  8. Bladder mass.  9. Laser surgery to the left eye.  10.Hernia repair to the right.  11.Cholecystectomy.  12.Vasectomy.   ALLERGIES:  SULFA caused a rash.   MEDICATIONS:  1. Travatan.  2. Metoprolol 50 mg b.i.d.  3. Simvastatin 40 mg daily.  4. Loratadine 10 mg daily.  5. Lisinopril 10 mg daily.  6. Potassium 10 mEq daily.  7. Centrum Silver.  8. Metamucil.  9.  Flonase.  10.Omeprazole 20 mg t.i.d.  11.Aspirin 325 mg daily.   REVIEW OF SYSTEMS:  As stated in the HPI and otherwise negative for  other systems.   PHYSICAL EXAMINATION:  The patient is in no distress.  Blood pressure 132/70, heart rate 55 and regular, weight 213 pounds,  body mass index 30.  HEENT:  Eyelids unremarkable, pupils are equal, round, and reactive to  light, fundi not visualized.  Oral mucosa unremarkable.  NECK:  No jugular venous distension at 45 degrees, carotid upstroke  brisk and symmetrical.  No bruit.  No thyromegaly.  LYMPHATICS:  No adenopathy.  LUNGS:  Clear to auscultation bilaterally.  BACK:  No costovertebral angle tenderness.  CHEST:  Unremarkable.  HEART:  PMI not displaced or sustained.  S1 and S2 are within normal  limits.  No S3, no S4.  No clicks, no rubs, no murmurs.  ABDOMEN:  Flat, positive bowel sounds, normal in frequency and pitch.  No bruits, no rebound, no guarding.  No midline pulsatile mass.  No  hepatomegaly, no splenomegaly.  SKIN:  No rashes, no nodules.  EXTREMITIES:  2+ pulses, no edema.   ASSESSMENT AND PLAN:  1. Atrial fibrillation:  We had a long conversation about this.  With      his age and hypertension, he would have an indication for Coumadin.      However, he says he knows exactly when he is in this rhythm.  He      has not been in it for longer than 48 hours by his report.      Therefore, the risk of thromboembolism is low.  His sense that he      knows exactly when he is in this rhythm has been corroborated in      the hospital where he has been in atrial fibrillation when he says      he has been in it, and then normal sinus rhythm otherwise.  He      understands that there is still some risk of thromboembolism with      this.  He would prefer to avoid the Coumadin.  Again, we had a long      discussion about this.  Will need to keep a close eye on this.  He      will continue and add on aspirin.  2. Hypertension:   Blood pressure is well controlled.  He will continue      the medications as listed.  3. Dyslipidemia:  He has a persistently mildly low HDL.  However, his      LDL is excellent.  He has a reasonable ratio.  No further      cardiovascular testing is suggested.  4. Followup:  I will see him back in 6 months.     [name], MD, [facility]  Electronically Signed   JH/MedQ  DD: 09/11/2007  DT: 09/11/2007  Job #: [id number]   cc:   Jeannett Senior A. Clent Ridges, MD

## 2011-03-23 NOTE — Assessment & Plan Note (Signed)
[facility]                            CARDIOLOGY OFFICE NOTE   ATA, PECHA                       MRN:          [id number]  DATE:03/20/2007                            DOB:          Nov 10, 1928    PRIMARY:  Tera Mater. Clent Ridges, M.D.   REASON FOR PRESENTATION:  Evaluate patient with atrial fibrillation.   HISTORY OF PRESENT ILLNESS:  The patient presents after being seen this  weekend in the ER for atrial fibrillation.  It started Friday.  He knew  his heart was out of rhythm; he could feel it in his chest.  He did not  have any chest pain, presyncope or syncope.  He was not particularly  short of breath.  He took his wife's atenolol.  This lasted all through  the evening.  Finally, when he was still in atrial fibrillation Saturday  morning he presented to the emergency room.  However, before he could  receive any treatment he spontaneously converted to sinus rhythm.  His  electrolytes were unremarkable.  He has felt fine since then and has had  no symptoms.  He otherwise has had no chest discomfort, neck or arm  discomfort.  He has had no PND or orthopnea.   PAST MEDICAL HISTORY:  1. Paroxysmal atrial fibrillation.  2. Dyslipidemia.  3. Hypertension since 1977.  4. Gastroesophageal reflux disease.  5. Nephrolithiasis.  6. Herniated disc.  7. Skin cancer resected.  8. Bladder mass.  9. Laser surgery to the left eye.  10.Hernia repair on the right.  11.Cholecystectomy.  12.Vasectomy.   ALLERGIES:  SULFA causes a rash.   MEDICATION:  1. Travatan.  2. Metoprolol 50 mg b.i.d.  3. Simvastatin 40 mg daily.  4. Loratadine 10 mg daily.  5. Lisinopril 10 mg daily.  6. Potassium 10 mEq daily.  7. Centrum Silver.  8. Metamucil.  9. Flonase.  10.Omeprazole 20 mg t.i.d.  11.Aspirin 325 mg daily.   REVIEW OF SYSTEMS:  As stated in the HPI and otherwise negative for  other systems.   PHYSICAL EXAMINATION:  GENERAL:  The patient is in no  distress.  VITAL SIGNS:  Blood pressure 137/79, heart rate 56 and regular, weight  204 pounds.  HEENT:  Eyelids unremarkable.  Pupils equal, round, and react to light.  Fundi not visualized.  Oral mucosa unremarkable.  NECK:  No jugular venous distention.  Waveform within normal limits.  Carotid upstroke brisk and symmetric.  No bruits, no thyromegaly.  LYMPHATICS:  No cervical, axillary or inguinal adenopathy.  LUNGS:  Clear to auscultation bilaterally.  BACK:  No costovertebral angle tenderness.  CHEST:  Unremarkable.  HEART:  PMI not displaced or sustained.  S1 and S2 within normal limits.  No S3, no S4.  No clicks, rubs, no murmurs.  ABDOMEN:  Flat, positive bowel sounds normal in frequency and pitch.  No  bruits, no rebound, no guarding.  No midline pulsatile mass.  No  organomegaly.  SKIN:  No rashes, no nodules.  EXTREMITIES:  Show 2+ pulses, no edema, no cyanosis, no clubbing.  NEUROLOGIC:  Oriented to  person, place and time.  Cranial nerves II-XII  grossly intact.  Motor grossly intact throughout.   ASSESSMENT AND PLAN:  1. Atrial fibrillation.  The patient has had another paroxysm of this.      This was less symptomatic than the previous.  At this point we      discussed possibly treating with flecainide.  However, we have      agreed that we will hold off on this as this was only his second      episode in the last year.  If he has a third episode in the next 12      months will most likely plan antiarrhythmic therapy.  He will      remain on Coumadin and also metoprolol for rate control.  2. Hypertension.  Blood pressure is well controlled.  He will continue      the medications as listed.  3. Followup.  I will see him back in October or sooner if needed.     [name], MD, [facility]     JH/MedQ  DD: 03/20/2007  DT: 03/20/2007  Job #: [id number]   cc:   Jeannett Senior A. Clent Ridges, MD

## 2011-03-26 NOTE — Discharge Summary (Signed)
[name] [name], [name] [name]                ACCOUNT NO.:  [id number]   MEDICAL RECORD NO.:  [id number]          PATIENT TYPE:  INP   LOCATION:  2918                         FACILITY:  [facility]   PHYSICIAN:  Jesse Sans. Wall, MD, FACCDATE OF BIRTH:  30-Apr-1929   DATE OF ADMISSION:  05/16/2006  DATE OF DISCHARGE:  05/17/2006                           DISCHARGE SUMMARY - REFERRING   DISCHARGE DIAGNOSES:  1. Atrial fibrillation with a rapid ventricular rate.  2. Hyperglycemia.  3. Hyperlipidemia.  4. History as noted below.   BRIEF HISTORY:  [name] is a 77 year old white male who presented with  palpitations that began approximately 7 p.m. while he was watering plants.  He denied any problems with chest discomfort, shortness of breath or  syncope.  He had called the office and was recommended to come to the  emergency room for further evaluation.  Upon presentation his EKG showed  atrial fibrillation with a rapid ventricular rate.   His history is notable for hypertension, hyperlipidemia, nephrolithiasis,  GERD, cholecystectomy, vasectomy, hernia repair.   LABORATORY DATA:  Admission H&H was 14.1 and 41.2, normal indices, platelets  194, wbc's 9.5.  Subsequent hematologies unremarkable.  PTT 31, PT 13.7.  Sodium 141, potassium 3.7, BUN 17, creatinine 1.2, normal LFTs, glucose was  121.  Subsequent chemistry essentially remained unchanged.  CK-MBs and  troponins were negative for myocardial infarction.  Fasting lipids showed a  total cholesterol of 124, triglycerides 90, HDL 35, LDL 71.  TSH was 3.109.  Subsequent EKGs initially showed atrial fibrillation.  However, on the 20th  EKG showed sinus rhythm with ventricular rate of 96, normal axis, delayed R  wave, nonspecific ST-T wave abnormalities.  Echocardiogram on the 10th  showed an EF of 60%, mild NAC, without wall motion abnormalities.   HOSPITAL COURSE:  [name] was admitted to [facility] with  atrial fibrillation.  The  patient after being placed on IV Cardizem reverted  to sinus bradycardia and his IV Cardizem was discontinued.  Dr. Daleen Squibb  assessed the patient on May 17, 2006, and felt that the patient could be  discharged home with an increase of aspirin and outpatient follow-up with  Dr. Antoine Poche.   DISPOSITION:  Dr. Daleen Squibb did not restrict the patient's diet, activity or  wound care.  He asked him to continue his home medications and increase his  aspirin to 325 daily.  He was also given permission to take metoprolol 50 mg  p.r.n. for atrial fibrillation.  He will follow up with Dr. Antoine Poche on July  17 at 9:30 a.m.     ______________________________  Joellyn Rued, PA-C      Jesse Sans. Daleen Squibb, MD, [facility]  Electronically Signed    EW/MEDQ  D:  07/25/2006  T:  07/25/2006  Job:  [id number]

## 2011-03-26 NOTE — Assessment & Plan Note (Signed)
[facility] OFFICE NOTE   [name] [name], [name] [name]                       MRN:          [id number]  DATE:07/18/2006                            DOB:          1929/10/13    This is a 77 year old gentleman here for a complete physical examination.  He feels fine and has no complaints at all.  He did spend a night in [facility] from July 9th through July 10th for a brief episode of atrial  fibrillation.  This was his first episode.  He converted quickly to sinus  rhythm and apparently has been in sinus rhythm ever since.  He saw d  Hochrein for cardiology followup on July 17th.  At that time, he felt that  since he was doing so well, no further medication changes were needed.  I  decided not to put him on Coumadin but to simply maintain an adult aspirin  daily.  [name] [name] is back to his usual exercise regimen and is having no  difficulties.  For other details of his past medical history, social  history, family history, habits, etc., refer to our last physical note dated  July 15, 2005.  He sees Dr. Victorino Dike for GI care and is not due for  another colonoscopy until next year.  He had been seeing Dr. Tresa Endo for  cardiology care but now sees Dr. Antoine Poche.  He sees Dr. Lonni Fix for  dermatology care.  He sees Dr. Gerlene Fee for care of back pain.  He sees Dr.  Nile Riggs for care of glaucoma, and he sees Dr. Vonita Moss twice a year for  urologic exams.   ALLERGIES:  SULFA.   CURRENT MEDICATIONS:  1. Travatan drops once daily to the eyes.  2. Metoprolol 50 mg 2 tablets a day.  3. Simvastatin 40 mg a day.  4. Loratadine 10 mg per day.  5. Lisinopril 10 mg per day.  6. Furosemide 10 mg per day.  7. Potassium chloride 10 mEq a day.  8. Aspirin 325 mg per day.  9. Multivitamins daily.  10.Metamucil twice daily.  11.Omeprazole 20 mg 2 a day.  12.Flonase nasal sprays daily.   OBJECTIVE:  VITAL SIGNS:  Weight 203.  BP  110/60, pulse 52 and regular.  GENERAL:  He appears to be quite healthy.  SKIN:  Free with no lesions.  HEENT:  Eyes clear, ears clear.  Pharynx clear.  NECK:  Supple without lymphadenopathy or masses.  LUNGS:  Clear.  CARDIAC:  Regular rate and rhythm without murmurs, rubs or gallops.  Distal  pulses are full.  ABDOMEN:  Soft, normal bowel sounds.  Nontender.  No masses.  GENITALIA:  Not done.  We will defer this to Dr. Vonita Moss.  RECTAL:  Prostate is slightly enlarged but smooth.  Stool is hemoccult  negative.  EXTREMITIES:  No clubbing, cyanosis or edema.  NEUROLOGIC:  Grossly intact.   EKG is within normal limits.   Patient had fairly complete blood work while in the hospital in July,  including a normal lipid panel.  Of note,  his LDL at that time was 71.  The  only thing missing from this was a PSA.   ASSESSMENT/PLAN:  1. Complete physical examination:  We will go ahead and check a PSA today      and forward this to Dr. Vonita Moss.  In general, he seems to be doing      well.  2. A single episode of atrial fibrillation:  He will continue to follow up      with Dr. Antoine Poche and hopefully nothing further will need to be done.  3. Hypertension, stable.  4. Gastroesophageal reflux disease, stable.  5. Hyperlipidemia, stable.  6. Allergies, stable.  7. Glaucoma, stable.                                   [name] [name]. Clent Ridges, MD   SAF/MedQ  DD:  07/18/2006  DT:  07/18/2006  Job #:  [id number]

## 2011-03-26 NOTE — Assessment & Plan Note (Signed)
[facility]                              CARDIOLOGY OFFICE NOTE   REGIE, BUNNER                       MRN:          [id number]  DATE:05/24/2006                            DOB:          12-21-1928    PRIMARY CARE PHYSICIAN:  Dr. Clent Ridges   REASON FOR PRESENTATION:  Evaluate patient with recent hospitalization for  atrial fibrillation.   HISTORY OF PRESENT ILLNESS:  Patient was hospitalized on July 9 with atrial  fibrillation.  He presented to [facility] and reports the entire episode  lasted about 14 hours.  He was monitored on telemetry.  I do not have  hospital discharge summary at this point.  He said he broke spontaneously.  His medications were not changed other than the addition of aspirin.  Since  then he has had no further palpitations.  He denies any presyncope or  syncope.  He has had no chest discomfort, neck discomfort, arm discomfort,  activity-induced nausea, vomiting, excessive diaphoresis.  He has had no  shortness of breath.  Denies any PND or orthopnea.   PAST MEDICAL HISTORY:  1.  Dyslipidemia times years.  2.  Hypertension since 1977.  3.  Gastroesophageal reflux disease.  4.  Nephrolithiasis.  5.  Herniated disk.  6.  Skin cancers, resected.  7.  Bladder mass.  8.  Laser surgery to the left eye.  9.  Hernia repair, inguinal on the right.  10. Cholecystectomy.  11. Vasectomy.   ALLERGIES:  SULFA caused a rash.   MEDICATIONS:  1.  Travatan.  2.  Metoprolol 50 mg one tablet b.i.d.  3.  Simvastatin 40 mg daily.  4.  Loratadine 10 mg daily.  5.  Lisinopril 10 mg daily.  6.  Potassium 10 mEq daily.  7.  Centrum Silver.  8.  Metamucil.  9.  Flonase.  10. Omeprazole.  11. Aspirin 325 mg daily.   REVIEW OF SYSTEMS:  As stated in the HPI and otherwise negative for other  systems.   PHYSICAL EXAMINATION:  GENERAL:  Patient is in no distress.  VITAL SIGNS:  Blood pressure 126/70, heart rate 49 and regular.  HEENT:   Eyelids unremarkable.  Pupils are equal, round, and reactive to  light.  Fundi not visualized.  Oral mucosa unremarkable.  NECK:  No jugular venous distention.  Wave form within normal limits.  Carotid upstroke brisk and symmetric.  No bruits, no thyromegaly.  LYMPHATICS:  No cervical, axillary, inguinal adenopathy.  LUNGS:  Clear to auscultation bilaterally.  BACK:  No costovertebral angle tenderness.  CHEST:  Unremarkable.  HEART:  PMI not displaced or sustained.  S1 and S2 within normal limits.  No  S3.  No S4.  No murmurs.  ABDOMEN:  Flat, positive bowel sounds.  Normal in frequency and pitch.  No  bruits, rebound, guarding.  No midline pulsatile mass.  No organomegaly.  SKIN:  No rashes, no lesions.  EXTREMITIES:  2+ pulses.  No edema.   EKG:  Sinus bradycardic and rate 49, axis within normal limits, intervals  within normal limits, no acute ST-T  wave changes.   ASSESSMENT AND PLAN:  1.  Atrial fibrillation.  The patient has had no further paroxysms.  We had      a long discussion.  I am going to repeat his echocardiogram and make      sure it is structurally normal.  It seems very clearly that he felt this      atrial fibrillation.  He did not have his medications changed and so it      is very possible that he would feel the next episode.  Given this, I do      think it is reasonable not to treat him with Coumadin since it was such      short duration and will likely know if he goes into it again.  We      discussed the risks and benefits of this approach and he would prefer to      be off the Coumadin if at all reasonable.  2.  Hypertension.  Blood pressure is well controlled.  He is somewhat      fatigued after his atrial fibrillation.  If his heart rate remains slow      we may need to consider backing off on his metoprolol.  3.  Follow-up.  I will see him back in about three months and see how he is      doing.                               [name], MD,  [facility]    JH/MedQ  DD:  05/24/2006  DT:  05/24/2006  Job #:  [id number]   cc:   Jeannett Senior A. Clent Ridges, MD

## 2011-03-26 NOTE — Op Note (Signed)
[place]. [facility]  Patient:    [name] [name], [name] [name]                       MRN: [id number] Proc. Date: 03/15/00 Adm. Date:  [id number] Disc. Date: [id number] Attending:  Lauree Chandler                           Operative Report  PREOPERATIVE DIAGNOSIS:  Gross hematuria and clot retention; rule out hemorrhage from bladder.  POSTOPERATIVE DIAGNOSIS:  Urinary retention from hematuria and clot retention and bleeding from bladder neck.  PROCEDURE:  Cystoscopy, evacuation of clots and fulguration of bladder neck.  SURGEON:  Maretta Bees. Vonita Moss, M.D.  ANESTHESIA:  General.  INDICATION:  This 77 year old gentleman has had a history of ureteral calculi. He underwent cystoscopy, left ureteroscopy and left uretero-meatotomy on February 18, 2000 and he did well after that until the last couple of days when he has developed gross hematuria, passage of clots and urinary retention from bleeding and clot formation.  He was admitted to the hospital early today in clot retention and catheter was inserted and many clots irrigated out.  He had a spiral CT and there was no upper tract obstruction and no ureteral calculi. There were a couple of small nonobstructing renal calculi and no change in his left renal cystic mass.  He is brought to the OR today for evaluation and therapy because of continued bleeding, hematuria and clots.  DESCRIPTION OF PROCEDURE:  The patient was brought to the operating room and placed in lithotomy position.  The external genitalia were prepped and draped in the usual fashion.  He was cystoscoped; the anterior urethra was normal. The prostatic urethra had partial obstruction and he had a hypervascular bladder neck.  When I entered the bladder, I could see nothing but clot and used 2 or 3 L of irrigation to get out at least a cupful of old clot.  I then inspected the bladder and there were no stones, tumors or inflammatory lesions.  There was no  bleeding from the left ureteral orifice where I had performed a meatotomy.  Clear urine was noted to be excreted from both ureteral orifices.  He had bleeding and oozing from the bladder neck.  I suspect his bleeding has been from the bladder neck, which was very vascular and capable of such significant hemorrhage.  I used the Bugbee electrode to coagulate and fulgurate the bladder neck at 6 oclock.  At this time, the bladder irrigation was totally clear.  There was no active bleeding.  A 24-French 30 cc Foley was inserted and 40 cc placed in the balloon and catheter was placed on traction.  He was sent to the recovery room in good condition, having tolerated the procedure well. DD:  03/15/00 TD:  03/17/00 Job: [id number]

## 2011-03-26 NOTE — Consult Note (Signed)
[place]. [facility]  Patient:    [name] [name], [name] [name]                       MRN: [id number] Proc. Date: 03/15/00 Adm. Date:  [id number] Attending:  Lauree Chandler Dictator:   [phone] CC:         Osvaldo Human, M.D.                          Consultation Report  EMERGENCY ROOM  HISTORY OF PRESENT ILLNESS: Dr. Ignacia Palma asked me to come in to help care for and evaluate [name] [name], a 77 year old white male who presented this morning with clot retention.  A Foley catheter was put in and is now draining well.  He was given pain medications and is still quite uncomfortable.  He has a past history of some hematuria in the last week and yesterday his bladder was not distended on an ultrasound, and three weeks ago he underwent a cystoscopy, ureteroscopy, left ureteral meatotomy and removal of large stones from the distal left ureter at [facility].  He had no significant bleeding after that beyond the first day or two so he has had a hiatus of almost three weeks with no bleeding.  He also on prior CT has a known right renal cyst.  I ordered a CT this morning and it showed bilateral renal calculi but no obstruction, and no change in his right renal mass that appeared to be a cyst, and renal ultrasound it appeared it was a cyst.  He has had no flank pain but severe bladder pain.  In the emergency room I switched his 18 Foley catheter to a 24 [place] Foley catheter and irrigated out multiple clots, and got the bladder decompressed and the catheter connected to closed drainage.  He will be admitted for IV fluids, bladder irrigations if needed, and cystoscopy to further evaluate his problem.  PAST MEDICAL HISTORY: He has had a history of hypertension.  ALLERGIES: SULFA.  MEDICATIONS: Inderal for hypertension.  SOCIAL HISTORY: No tobacco.  Occasional alcohol.  REVIEW OF SYSTEMS: No respiratory problems.  No complaints of heart palpitations or chest  pain.  No GI complaints.  No extremity complaints.  PHYSICAL EXAMINATION:  GENERAL: On examination he is a white male in no acute distress, complaining of suprapubic pain.  He was alert and oriented, but in obvious distress.  VITAL SIGNS: Blood pressure 139/73, pulse 74, respiratory rate 20.  SKIN: Warm and dry.  RESPIRATORY: No respiratory distress.  HEART: Heart tones regular.  ABDOMEN: Distended in suprapubic region.  No CVA tenderness.  No hernias.  No hepatosplenomegaly.  GU: Urethral meatus, scrotum, testicles, and epididymis unremarkable except for Foley catheter in place, with blood around it.  RECTAL: Deferred as recent examination revealed benign feeling prostate.  EXTREMITIES: Negative.  LABORATORY DATA: CBC revealed a hemoglobin of 13.8.  Creatinine was 1.0. Electrolytes were unremarkable.  IMPRESSION:  1. Gross hematuria with clots, rule out bladder hemorrhage.  2. Bilateral renal stones.  3. Right renal cyst.  4. Hypertension.  PLAN: Admit for IV fluids, bladder irrigations, and cystoscopy.  Work-up later today. DD:  03/15/00 TD:  03/15/00 Job: [id number]

## 2011-03-26 NOTE — Assessment & Plan Note (Signed)
[facility]                                   ON-CALL NOTE   [name] [name], [name] [name]                       MRN:          [id number]  DATE:05/16/2006                            DOB:          06-02-1929    SUMMARY OF HISTORY:  On May 16, 2006 at approximately 8:45 I was paged by  [name] [name]. He states that approximately 2 hours prior to his page, he  developed a rapid irregular rhythm.  He denied any associated chest  discomfort, shortness of breath, nausea, vomiting, diaphoresis, dizziness,  or syncope. He states that his blood pressure has been stable, i.e., in the  150's/70's.  He states that his pulse is going too fast to count.   When this initially began he remembers his wife's physician telling his wife  that when she developed a fast heart rate associated with her atrial  fibrillation, she could take an extra Atenolol.  [name] [name] took 100 mg of  Atenolol that belonged to his wife, in addition to his prescribed Lopressor.  He stated he had taken these medications about the time of onset and his  pulse was now (he thinks) right around 100. Again, he is not having any  associated symptoms.   When I questioned [name] [name] about his cardiac history, he describes a  history of hypertension which is followed by Dr. Antoine Poche. He states he has  not had any heart problems per se.  He does not recall a history of atrial  fibrillation.   I reviewed [name] [name] options with him. He and his family elected to come  to the emergency room for further evaluation, thus I informed the emergency  room and the Fellow of his pending arrival.                                   Joellyn Rued, [place]                                [name], MD, [facility]   EW/MedQ  DD:  05/18/2006  DT:  05/18/2006  Job #:  [id number]

## 2011-03-26 NOTE — Assessment & Plan Note (Signed)
[facility]                              CARDIOLOGY OFFICE NOTE   [name] [name], [name] [name]                       MRN:          [id number]  DATE:08/25/2006                            DOB:          1928-11-20    PRIMARY CARE PHYSICIAN:  Tera Mater. Clent Ridges, MD   REASON FOR PRESENTATION:  Evaluate patient with atrial fibrillation.   HISTORY OF PRESENT ILLNESS:  Patient returns for followup.  Since I last saw  him, he has had no paroxysms of atrial fibrillation.  He was very clear when  he had this.  It was a short-lived burst.  He has had no palpitations,  presyncope, or syncope.  He has had no chest pain or shortness of breath.   PAST MEDICAL HISTORY:  Dyslipidemia for years, hypertension since 1977,  gastroesophageal reflux disease, paroxysmal atrial fibrillation,  nephrolithiasis, herniated disk, skin cancer, resected, bladder mass, laser  surgery of the left eye, hernia repair on the right, cholecystectomy,  vasectomy.   ALLERGIES:  SULFA caused a rash.   MEDICATIONS:  1. Travatan.  2. Metoprolol 50 mg b.i.d.  3. Simvastatin 40 mg daily.  4. Loratadine 10 mg daily.  5. Lisinopril 10 mg daily.  6. Potassium 10 mEq daily.  7. Centrum Silver.  8. Metamucil.  9. Flonase.  10.Omeprazole 20 mg t.i.d.  11.Aspirin 325 daily.   REVIEW OF SYSTEMS:  As stated in the HPI, otherwise negative for other  systems.   PHYSICAL EXAMINATION:  GENERAL:  Patient is in no distress.  VITAL SIGNS:  Blood pressure 148/70, heart rate 52 and regular.  NECK:  No jugular venous distention.  Wave form within normal limits.  Carotid upstroke brisk and symmetric.  No .  LUNGS:  Clear to auscultation bilaterally.  HEART:  PMI not displaced or sustained.  S1 and S2 within normal limits.  No  S3, no S4, no clicks, rubs, or murmurs.  ABDOMEN:  Flat, positive bowel sounds.  Normal in frequency and pitch.  No  bruits, rebound, guarding.  There are no midline pulsatile masses.   No  organomegaly.  SKIN:  No rashes, no nodules.  EXTREMITIES:  Pulses 2+ throughout.  No cyanosis, no clubbing, no edema.  NEUROLOGIC:  Alert and oriented to person, place, and time.  Cranial nerves  II-XII grossly intact.  Motor grossly intact.   EKG:  Sinus rhythm, rate 52, axis within normal limits, intervals within  normal limits, no acute ST-T wave changes.   ASSESSMENT/PLAN:  1. Atrial fibrillation:  The patient is having no paroxysms of this.  No      further cardiovascular testing is suggested.  He will continue with the      medications as listed.  2. Hypertension:  Blood pressure is very slightly elevated today, but it      has not been at home and it has not been here in the office.  He will      keep an eye on that and remain on the medications as listed.  3. Followup:  He would like to be seen annually, and  I will arrange this.            ______________________________  [name], MD, [facility]     JH/MedQ  DD:  08/25/2006  DT:  08/26/2006  Job #:  [id number]   cc:   Jeannett Senior A. Clent Ridges, MD

## 2011-03-26 NOTE — H&P (Signed)
NAME:  [name], [name] NO.:  [id number]   MEDICAL RECORD NO.:  [id number]          PATIENT TYPE:  EMS   LOCATION:  [facility]                         FACILITY:  [facility]   PHYSICIAN:  Audery Amel, MD     DATE OF BIRTH:  September 04, 1929   DATE OF ADMISSION:  05/16/2006  DATE OF DISCHARGE:                                HISTORY & PHYSICAL   PRIMARY CARDIOLOGIST:  [name], M.D.   CHIEF COMPLAINT:  Palpitations.   HISTORY OF PRESENT ILLNESS:  The patient is a 77 year old white male with  past medical history notable for hypertension and hyperlipidemia who  presents to the emergency department for further evaluation and management  of tachycardia.  The patient was with his wife at the [facility]  offices today.  Was in his usual state of health without problems.  This  evening at approximately 7 p.m. while on his deck watering flowers, the  patient notes the onset of palpitations.  He denies any chest pain,  shortness of breath, dyspnea on exertion, orthopnea, PND, presyncope, or  syncope.  He spoke with one of the [facility] physician assistants earlier this  evening who recommended that the patient come to the emergency department  for further evaluation and management.  On arrival, the patient was noted to  be in atrial fibrillation with RVR (ventricular response of 130).  There is  no evidence of injury or ischemia on his EKG.  Otherwise he is without  complaints.   PAST MEDICAL HISTORY:  1.  Hypertension.  2.  Hyperlipidemia.  3.  History of nephrolithiasis.  4.  GERD.  5.  Successful inguinal hernia repair.  6.  History of cholecystectomy.  7.  History of vasectomy.   ALLERGIES:  SULFA.   MEDICATIONS:  1.  Travatan one drop OU daily.  2.  Omeprazole 40 mg daily.  3.  Metoprolol 50 mg b.i.d.  4.  Simvastatin 40 mg daily.  5.  Loratadine 10 mg daily.  6.  Lisinopril 10 mg daily.  7.  Potassium chloride 10 mEq daily.  8.  Furosemide 10 mg p.r.n.  9.  flunisolide two sprays q.a.m., one spray q.p.m. each nostril daily.  10. Aspirin 325 mg daily.  11. Multivitamins daily.   SOCIAL HISTORY:  The patient lives in [place] with his wife. He has two  children.  He is retired.  He denies any tobacco, alcohol, or illicit  substance.   FAMILY HISTORY:  Father died from a myocardial infarction at age of 17.  Otherwise there is no early history of coronary artery disease in his  family.   REVIEW OF SYSTEMS:  GENERAL:  He denies any fevers, chills, sweats, or  adenopathy.  HEENT: Denies headaches, sore throat, nasal discharge, or  change in visual acuity.  SKIN:  No rashes or lesions.  PULMONARY:  As per  HPI.  GU:  No frequency, urgency, dysuria.  PSYCHIATRIC:  No weakness,  numbness, or mood disturbance.  MUSCULOSKELETAL:  No myalgias, no  arthralgias,, no joint swelling or deformity.  GI:  No nausea, vomiting,  diarrhea,  bright red blood per rectum, melena, hematemesis, dysphagia,  odynophagia.  ENDOCRINE:  No polyuria, no polydipsia.  No heat or cold  intolerance.  All other review of systems negative.   PHYSICAL EXAMINATION:  VITAL SIGNS:  Blood pressure 117/63, pulse 88, oxygen  saturation 99% on room air.  GENERAL:  The patient alert, oriented x3, in no acute distress.  Pleasant  and conversant.  HEENT:  Normocephalic, atraumatic.  EOMI.  PERRL.  Nares patent.  Oropharynx  clear without erythema or exudate.  NECK:  Supple.  Full range of motion.  No JVD.  Carotid upstrokes are equal  and symmetric bilaterally, without audible bruit.  Lymphadenopathy:  None.  CARDIOVASCULAR:  Irregularly irregular.  S1 and S2 without audible murmurs,  rubs or gallops.  LUNGS:  Clear to auscultation bilaterally.  SKIN:  No rashes or lesions.  ABDOMEN:  Soft, nontender, nondistended. Positive bowel sounds.  No  hepatosplenomegaly.  GU:  Normal male genitalia.  RECTAL:  Deferred.  EXTREMITIES:  No clubbing, cyanosis or edema.  MUSCULOSKELETAL:   No joint deformity or effusions.  NEUROLOGIC:  Sensation and strength are grossly intact throughout.   LABORATORY DATA:  Chest x-ray pending.  EKG shows atrial fibrillation with a  ventricular response of 130 beats per minute.  There is no evidence for  injury or acute ischemia.  White count 9.5, hematocrit 41, platelet count  194.  Sodium 141, potassium 3.7, chloride 105, CO2 28, BUN 17, creatinine  1.2, glucose 121, calcium 8.9.  LFTs within normal limits.  Troponin 0.03.  CK-MB 4.3, CK 153.   IMPRESSION:  A 77 year old white male with new onset of atrial fibrillation.   PLAN:  Plan to admit to telemetry bed for further evaluation and management  of new onset atrial fibrillation.  Rule out myocardial infarction with  serial cardiac enzymes although my suspicion is low.  The patient states  that his symptoms onset a 7 a.m. and is pretty clear with regards to the  timing.  He states he has had no prior episodes.  Will treat the patient  with a single dose of Lovenox 90 mg subcu x1 for anticoagulation.  I have  treated him with a bolus of 20 mg of diltiazem and a rate of 5 mg per hour  in the emergency department with adequate rate control in the 60s.  If the patient fails to convert to sinus rhythm overnight, consider elective  DC cardioversion in the a.m. +/- TEE.  Will make the patient NPO after  midnight.  Continue with his home medication regimen. We will add TFTs to  his blood work to rule out hyperthyroidism.      Audery Amel, MD  Electronically Signed     SHG/MEDQ  D:  05/17/2006  T:  05/17/2006  Job:  [id number]

## 2011-03-26 NOTE — Op Note (Signed)
NAME:  [name] [name], [name] [name]                          ACCOUNT NO.:  [id number]   MEDICAL RECORD NO.:  [id number]                   PATIENT TYPE:  AMB   LOCATION:  [facility]                                 FACILITY:  [facility]   PHYSICIAN:  Maretta Bees. Vonita Moss, M.D.             DATE OF BIRTH:  20-Dec-1928   DATE OF PROCEDURE:  10/14/2003  DATE OF DISCHARGE:                                 OPERATIVE REPORT   PREOPERATIVE DIAGNOSES:  1. Micro hematuria.  2. Erythematous lesion in bladder.  3. History of renal stones and cysts.   POSTOPERATIVE DIAGNOSES:  1. Micro hematuria.  2. Erythematous lesion in bladder.  3. History of renal stones and cysts.   OPERATION/PROCEDURE:  1. Cystoscopy.  2. Bilateral retrograde pyelograms with interpretation.  3. Cold cup bladder biopsy and fulguration of the vascular bladder lesion.   SURGEON:  Maretta Bees. Vonita Moss, M.D.   ANESTHESIA:  General.   INDICATIONS:  This 77 year old gentleman had micro hematuria and a CT scan  showed small bilateral renal calculi and bilateral renal cysts noted before.  There was no hydronephrosis.  Office cystoscopy revealed a stellate vascular  lesion on the dome that was about 1 cm in size.  NMP-22 was negative.  He is  brought to the OR today for further evaluation, retrograde pyelograms and  biopsy and fulguration of his bladder lesion.   DESCRIPTION OF PROCEDURE:  The patient is brought to the operating room and  placed in the lithotomy position.  The external genitalia were prepped and  draped in the usual fashion.  He was cystoscoped.  The anterior urethra was  normal.  He had partial prostatic obstruction.  The only lesion in the  bladder was this 1 cm stellate vascular lesion on the dome.   Bilateral retrograde pyelograms were obtained with the acorn-tip catheter  for hematuria workup.  There was no evidence of obstruction or filling  defects.  In the right ureter over the vessels, there were some small  filling defects that  after further injection of contrast turned out to be  small air bubbles.  Pyelocalyceal system showed no evidence of obstruction.  I then biopsied this vascular lesion in the bladder with a cold cup bladder  biopsy forceps and then fulgurated this 1 cm area to destroy it completely.   At this point the bladder was emptied, the scope removed and the patient  sent to the recovery room in good condition with good hemostasis and no  blood loss.                                               Maretta Bees. Vonita Moss, M.D.    LJP/MEDQ  D:  10/14/2003  T:  10/14/2003  Job:  [id number]

## 2011-05-19 ENCOUNTER — Emergency Department (HOSPITAL_COMMUNITY)
Admission: EM | Admit: 2011-05-19 | Discharge: 2011-05-19 | Disposition: A | Discharge status: Home / Self Care | Payer: Medicare Other | Attending: Emergency Medicine | Admitting: Emergency Medicine

## 2011-05-19 DIAGNOSIS — I10 Essential (primary) hypertension: Secondary | ICD-10-CM | POA: Insufficient documentation

## 2011-05-19 DIAGNOSIS — I4891 Unspecified atrial fibrillation: Secondary | ICD-10-CM | POA: Insufficient documentation

## 2011-05-19 DIAGNOSIS — Z79899 Other long term (current) drug therapy: Secondary | ICD-10-CM | POA: Insufficient documentation

## 2011-05-19 DIAGNOSIS — Z7982 Long term (current) use of aspirin: Secondary | ICD-10-CM | POA: Insufficient documentation

## 2011-05-19 DIAGNOSIS — E781 Pure hyperglyceridemia: Secondary | ICD-10-CM | POA: Insufficient documentation

## 2011-05-19 LAB — BASIC METABOLIC PANEL
BUN: 24 mg/dL — ABNORMAL HIGH (ref 6–23)
Calcium: 8.8 mg/dL (ref 8.4–10.5)
GFR calc Af Amer: >60 mL/min (ref >60)
GFR calc non Af Amer: >60 mL/min (ref >60)
Glucose, Bld: 128 mg/dL — ABNORMAL HIGH (ref 70–99)
Potassium: 3.7 mEq/L (ref 3.5–5.1)

## 2011-05-19 LAB — CBC
HCT: 38.6 % — ABNORMAL LOW (ref 39.0–52.0)
Hemoglobin: 13.1 g/dL (ref 13.0–17.0)
MCH: 31.6 pg (ref 26.0–34.0)
MCHC: 33.9 g/dL (ref 30.0–36.0)
MCV: 93.2 fL (ref 78.0–100.0)
Platelets: 180 K/uL (ref 150–400)
RBC: 4.14 MIL/uL — ABNORMAL LOW (ref 4.22–5.81)
RDW: 13.3 % (ref 11.5–15.5)
WBC: 10.7 K/uL — ABNORMAL HIGH (ref 4.0–10.5)

## 2011-05-19 LAB — BASIC METABOLIC PANEL WITH GFR
CO2: 30 meq/L (ref 19–32)
Chloride: 106 meq/L (ref 96–112)
Creatinine, Ser: 1.05 mg/dL (ref 0.50–1.35)
Sodium: 142 meq/L (ref 135–145)

## 2011-05-19 LAB — DIFFERENTIAL
Basophils Absolute: 0 10*3/uL (ref 0.0–0.1)
Basophils Relative: 0 % (ref 0–1)
Eosinophils Absolute: 0.3 K/uL (ref 0.0–0.7)
Eosinophils Relative: 3 % (ref 0–5)
Lymphocytes Relative: 22 % (ref 12–46)
Lymphs Abs: 2.4 K/uL (ref 0.7–4.0)
Monocytes Absolute: 1.1 10*3/uL — ABNORMAL HIGH (ref 0.1–1.0)
Monocytes Relative: 10 % (ref 3–12)
Neutro Abs: 6.9 K/uL (ref 1.7–7.7)
Neutrophils Relative %: 65 % (ref 43–77)

## 2011-06-18 ENCOUNTER — Encounter: Payer: Self-pay | Admitting: Cardiology

## 2011-06-30 ENCOUNTER — Emergency Department (HOSPITAL_COMMUNITY)
Admission: EM | Admit: 2011-06-30 | Discharge: 2011-06-30 | Disposition: A | Discharge status: Home / Self Care | Payer: Medicare Other | Attending: Emergency Medicine | Admitting: Emergency Medicine

## 2011-06-30 ENCOUNTER — Encounter (HOSPITAL_COMMUNITY): Payer: Self-pay

## 2011-06-30 ENCOUNTER — Emergency Department (HOSPITAL_COMMUNITY): Payer: Medicare Other

## 2011-06-30 DIAGNOSIS — R109 Unspecified abdominal pain: Secondary | ICD-10-CM | POA: Insufficient documentation

## 2011-06-30 DIAGNOSIS — I10 Essential (primary) hypertension: Secondary | ICD-10-CM | POA: Insufficient documentation

## 2011-06-30 DIAGNOSIS — N201 Calculus of ureter: Secondary | ICD-10-CM | POA: Insufficient documentation

## 2011-06-30 DIAGNOSIS — E781 Pure hyperglyceridemia: Secondary | ICD-10-CM | POA: Insufficient documentation

## 2011-06-30 DIAGNOSIS — N1330 Unspecified hydronephrosis: Secondary | ICD-10-CM | POA: Insufficient documentation

## 2011-06-30 DIAGNOSIS — N289 Disorder of kidney and ureter, unspecified: Secondary | ICD-10-CM | POA: Insufficient documentation

## 2011-06-30 DIAGNOSIS — Z79899 Other long term (current) drug therapy: Secondary | ICD-10-CM | POA: Insufficient documentation

## 2011-06-30 LAB — DIFFERENTIAL
Basophils Absolute: 0 10*3/uL (ref 0.0–0.1)
Basophils Relative: 0 % (ref 0–1)
Eosinophils Absolute: 0 10*3/uL (ref 0.0–0.7)
Eosinophils Relative: 0 % (ref 0–5)
Lymphocytes Relative: 5 % — ABNORMAL LOW (ref 12–46)
Monocytes Absolute: 0.6 10*3/uL (ref 0.1–1.0)

## 2011-06-30 LAB — CBC
MCHC: 33.8 g/dL (ref 30.0–36.0)
Platelets: 185 10*3/uL (ref 150–400)
RDW: 13.1 % (ref 11.5–15.5)
WBC: 15.2 10*3/uL — ABNORMAL HIGH (ref 4.0–10.5)

## 2011-06-30 LAB — URINALYSIS, ROUTINE W REFLEX MICROSCOPIC
Bilirubin Urine: NEGATIVE
Leukocytes, UA: NEGATIVE
Nitrite: NEGATIVE
Specific Gravity, Urine: 1.027 (ref 1.005–1.030)
Urobilinogen, UA: 0.2 mg/dL (ref 0.0–1.0)
pH: 5.5 (ref 5.0–8.0)

## 2011-06-30 LAB — BASIC METABOLIC PANEL
Chloride: 102 mEq/L (ref 96–112)
GFR calc Af Amer: 59 mL/min — ABNORMAL LOW (ref >60)
GFR calc non Af Amer: 48 mL/min — ABNORMAL LOW (ref >60)
Potassium: 3.9 mEq/L (ref 3.5–5.1)
Sodium: 141 mEq/L (ref 135–145)

## 2011-06-30 LAB — URINE MICROSCOPIC-ADD ON

## 2011-07-08 ENCOUNTER — Other Ambulatory Visit (HOSPITAL_COMMUNITY): Payer: Medicare Other

## 2011-07-13 ENCOUNTER — Ambulatory Visit (HOSPITAL_COMMUNITY): Admission: RE | Admit: 2011-07-13 | Payer: Medicare Other | Source: Ambulatory Visit | Admitting: Urology

## 2011-07-15 ENCOUNTER — Ambulatory Visit: Payer: Self-pay | Admitting: Cardiology

## 2011-08-16 ENCOUNTER — Encounter: Payer: Self-pay | Admitting: Cardiology

## 2011-08-16 ENCOUNTER — Ambulatory Visit (INDEPENDENT_AMBULATORY_CARE_PROVIDER_SITE_OTHER): Payer: Medicare Other | Admitting: Cardiology

## 2011-08-16 DIAGNOSIS — I10 Essential (primary) hypertension: Secondary | ICD-10-CM

## 2011-08-16 DIAGNOSIS — I4891 Unspecified atrial fibrillation: Secondary | ICD-10-CM

## 2011-08-16 MED ORDER — DABIGATRAN ETEXILATE MESYLATE 150 MG PO CAPS: 150.0000 mg | ORAL_CAPSULE | Freq: Two times a day (BID) | ORAL | Status: DC

## 2011-08-16 NOTE — Assessment & Plan Note (Signed)
Given his recurrent atrial fibrillation, age and HTN he has indications for Pradaxa.  I will prescribe this.  We discussed at length the risk benefits.  I will ask Dr. Clent Ridges to check his TSH, CBC and BMET.  His creat was slightly high with his recent nephrolithiasis.

## 2011-08-16 NOTE — Progress Notes (Signed)
HPI The patient presents for followup of atrial fibrillation. Since I last saw him in July he did have recurrent fibrillation and was seen in the hospital. This broke spontaneously. He felt the palpitations but he had no chest discomfort which, neck or arm discomfort. He didn't have any presyncope or syncope. He remains active walking several days per week,  He has no new shortness of breath, PND or orthopnea. He has had no weight gain or edema.  Allergies  Allergen Reactions  . Sulfamethoxazole     REACTION: rash    Current Outpatient Prescriptions  Medication Sig Dispense Refill  . aspirin 325 MG tablet Take 325 mg by mouth daily.        . cetirizine (ZYRTEC) 10 MG tablet Take 10 mg by mouth daily.        Marland Kitchen latanoprost (XALATAN) 0.005 % ophthalmic solution Place 1 drop into both eyes daily.        Marland Kitchen lisinopril (PRINIVIL,ZESTRIL) 10 MG tablet Take 10 mg by mouth daily.        Marland Kitchen loratadine (CLARITIN) 10 MG tablet Take 10 mg by mouth daily.        . metoprolol (LOPRESSOR) 50 MG tablet Take 50 mg by mouth 2 (two) times daily.        . mometasone (NASONEX) 50 MCG/ACT nasal spray Place 2 sprays into the nose daily.        . Multiple Vitamins-Minerals (CENTRUM SILVER PO) Take 1 tablet by mouth daily.        Marland Kitchen omeprazole (PRILOSEC) 20 MG capsule Take 40 mg by mouth daily.        . potassium chloride (KLOR-CON) 10 MEQ CR tablet Take 10 mEq by mouth daily.        . Psyllium (METAMUCIL PO) Take by mouth 2 (two) times daily.        . simvastatin (ZOCOR) 40 MG tablet Take 40 mg by mouth at bedtime.          Past Medical History  Diagnosis Date  . Allergic rhinitis   . Hyperlipidemia   . HTN (hypertension)   . Low back pain   . Paroxysmal atrial fibrillation   . GERD (gastroesophageal reflux disease)   . Glaucoma   . Hx of colonic polyps   . Nephrolithiasis   . S/P colonoscopy     Past Surgical History  Procedure Date  . Cholecystectomy   . Vasectomy   . Right inguinal hernia repair     . Refractive surgery   . Ruptured blood vessel in the bladder   . Removed 2 stones from left ureter   . Blood vessel repair in bladder     ROS:  Nephrolithiasis.  Otherwise as stated in the HPI and negative for all other systems.   PHYSICAL EXAM BP 128/72  Pulse 51  Ht 5\' 10"  (1.778 m)  Wt 215 lb (97.523 kg)  BMI 30.85 kg/m2 GENERAL:  Well appearing HEENT:  Pupils equal round and reactive, fundi not visualized, oral mucosa unremarkable NECK:  No jugular venous distention, waveform within normal limits, carotid upstroke brisk and symmetric, no bruits, no thyromegaly LYMPHATICS:  No cervical, inguinal adenopathy LUNGS:  Clear to auscultation bilaterally BACK:  No CVA tenderness CHEST:  Unremarkable HEART:  PMI not displaced or sustained,S1 and S2 within normal limits, no S3, no S4, no clicks, no rubs, no murmurs ABD:  Flat, positive bowel sounds normal in frequency in pitch, no bruits, no rebound, no guarding, no midline  pulsatile mass, no hepatomegaly, no splenomegaly EXT:  2 plus pulses throughout, no edema, no cyanosis no clubbing SKIN:  No rashes no nodules NEURO:  Cranial nerves II through XII grossly intact, motor grossly intact throughout PSYCH:  Cognitively intact, oriented to person place and time   EKG:  Sinus rhythm, rate 51, axis within normal limits, intervals within normal limits, no acute ST-T wave changes.   ASSESSMENT AND PLAN

## 2011-08-16 NOTE — Assessment & Plan Note (Signed)
The blood pressure is at target. No change in medications is indicated. We will continue with therapeutic lifestyle changes (TLC).  

## 2011-08-16 NOTE — Patient Instructions (Signed)
Start Pradaxa as instructed - Rx was given for you to take to the [facility]  Continue all other medications as listed  Follow up in 4 months with Dr Antoine Poche.  You will receive a letter in the mail 2 months before you are due.  Please call us when you receive this letter to schedule your follow up appointment.

## 2011-08-19 ENCOUNTER — Encounter: Payer: Self-pay | Admitting: Family Medicine

## 2011-08-19 ENCOUNTER — Ambulatory Visit (INDEPENDENT_AMBULATORY_CARE_PROVIDER_SITE_OTHER): Payer: Medicare Other | Admitting: Family Medicine

## 2011-08-19 VITALS — BP 128/70 | HR 55 | Temp 98.4°F | Ht 70.0 in | Wt 213.0 lb

## 2011-08-19 DIAGNOSIS — I480 Paroxysmal atrial fibrillation: Secondary | ICD-10-CM

## 2011-08-19 DIAGNOSIS — I10 Essential (primary) hypertension: Secondary | ICD-10-CM

## 2011-08-19 DIAGNOSIS — N138 Other obstructive and reflux uropathy: Secondary | ICD-10-CM

## 2011-08-19 DIAGNOSIS — K219 Gastro-esophageal reflux disease without esophagitis: Secondary | ICD-10-CM

## 2011-08-19 DIAGNOSIS — Z23 Encounter for immunization: Secondary | ICD-10-CM

## 2011-08-19 DIAGNOSIS — I4891 Unspecified atrial fibrillation: Secondary | ICD-10-CM

## 2011-08-19 DIAGNOSIS — N139 Obstructive and reflux uropathy, unspecified: Secondary | ICD-10-CM

## 2011-08-19 DIAGNOSIS — N401 Enlarged prostate with lower urinary tract symptoms: Secondary | ICD-10-CM

## 2011-08-19 LAB — HEPATIC FUNCTION PANEL
Albumin: 4.5 g/dL (ref 3.5–5.2)
Total Protein: 7 g/dL (ref 6.0–8.3)

## 2011-08-19 LAB — CBC WITH DIFFERENTIAL/PLATELET
Eosinophils Relative: 3.4 % (ref 0.0–5.0)
Lymphocytes Relative: 29.2 % (ref 12.0–46.0)
MCV: 97 fl (ref 78.0–100.0)
Monocytes Absolute: 0.8 10*3/uL (ref 0.1–1.0)
Monocytes Relative: 8.9 % (ref 3.0–12.0)
Neutrophils Relative %: 58.2 % (ref 43.0–77.0)
Platelets: 196 10*3/uL (ref 150.0–400.0)
RBC: 4.17 Mil/uL — ABNORMAL LOW (ref 4.22–5.81)
WBC: 8.9 10*3/uL (ref 4.5–10.5)

## 2011-08-19 LAB — BASIC METABOLIC PANEL
BUN: 27 mg/dL — ABNORMAL HIGH (ref 6–23)
Calcium: 9 mg/dL (ref 8.4–10.5)
GFR: 94.71 mL/min (ref >60.00)
Glucose, Bld: 102 mg/dL — ABNORMAL HIGH (ref 70–99)
Potassium: 4 mEq/L (ref 3.5–5.1)
Sodium: 140 mEq/L (ref 135–145)

## 2011-08-19 LAB — PSA: PSA: 1.19 ng/mL (ref 0.10–4.00)

## 2011-08-19 LAB — LIPID PANEL
Cholesterol: 152 mg/dL (ref 0–200)
HDL: 39.4 mg/dL (ref >39.00)
LDL Cholesterol: 83 mg/dL (ref 0–99)
VLDL: 29.4 mg/dL (ref 0.0–40.0)

## 2011-08-19 LAB — POCT URINALYSIS DIPSTICK
Bilirubin, UA: NEGATIVE
Glucose, UA: NEGATIVE
Leukocytes, UA: NEGATIVE
Nitrite, UA: NEGATIVE
Urobilinogen, UA: 0.2

## 2011-08-19 LAB — TSH: TSH: 1.27 u[IU]/mL (ref 0.35–5.50)

## 2011-08-19 NOTE — Progress Notes (Signed)
  Subjective:    Patient ID: [name] [name], male    DOB: 1929/10/16, 77 y.o.   MRN: [id number]  HPI 77 yr old male for a cpx. He feels fine and has no concerns. He saw Dr. Antoine Poche recently, who recommended he start on chronic anticoagulation due to his recurrent atrial fib. He is waiting to see if the [facility] will supply him with Pradexa, or if not he will go on Coumadin.    Review of Systems  Constitutional: Negative.   HENT: Negative.   Eyes: Negative.   Respiratory: Negative.   Cardiovascular: Negative.   Gastrointestinal: Negative.   Genitourinary: Negative.   Musculoskeletal: Negative.   Skin: Negative.   Neurological: Negative.   Hematological: Negative.   Psychiatric/Behavioral: Negative.        Objective:   Physical Exam  Constitutional: He is oriented to person, place, and time. He appears well-developed and well-nourished. No distress.  HENT:  Head: Normocephalic and atraumatic.  Right Ear: External ear normal.  Left Ear: External ear normal.  Nose: Nose normal.  Mouth/Throat: Oropharynx is clear and moist. No oropharyngeal exudate.  Eyes: Conjunctivae and EOM are normal. Pupils are equal, round, and reactive to light. Right eye exhibits no discharge. Left eye exhibits no discharge. No scleral icterus.  Neck: Neck supple. No JVD present. No tracheal deviation present. No thyromegaly present.  Cardiovascular: Normal rate, regular rhythm, normal heart sounds and intact distal pulses.  Exam reveals no gallop and no friction rub.   No murmur heard. Pulmonary/Chest: Effort normal and breath sounds normal. No respiratory distress. He has no wheezes. He has no rales. He exhibits no tenderness.  Abdominal: Soft. Bowel sounds are normal. He exhibits no distension and no mass. There is no tenderness. There is no rebound and no guarding.  Genitourinary: Rectum normal, prostate normal and penis normal. Guaiac negative stool. No penile tenderness.  Musculoskeletal: Normal range of  motion. He exhibits no edema and no tenderness.  Lymphadenopathy:    He has no cervical adenopathy.  Neurological: He is alert and oriented to person, place, and time. He has normal reflexes. No cranial nerve deficit. He exhibits normal muscle tone. Coordination normal.  Skin: Skin is warm and dry. No rash noted. He is not diaphoretic. No erythema. No pallor.  Psychiatric: He has a normal mood and affect. His behavior is normal. Judgment and thought content normal.          Assessment & Plan:  Well exam. We will get fasting labs  today. I agree with starting on anticoagulation. His HTN is stable

## 2011-08-20 ENCOUNTER — Telehealth: Payer: Self-pay | Admitting: Family Medicine

## 2011-08-20 ENCOUNTER — Encounter: Payer: Self-pay | Admitting: Family Medicine

## 2011-08-20 NOTE — Telephone Encounter (Signed)
Message copied by Baldemar Friday on Fri Aug 20, 2011  4:34 PM ------      Message from: [name] A      Created: Fri Aug 20, 2011  4:20 PM       Normal

## 2011-08-20 NOTE — Telephone Encounter (Signed)
Spoke with pt and gave results. 

## 2011-08-20 NOTE — Telephone Encounter (Signed)
Message copied by Baldemar Friday on Fri Aug 20, 2011 12:10 PM ------      Message from: [name] A      Created: Fri Aug 20, 2011  9:30 AM       Normal

## 2011-08-20 NOTE — Telephone Encounter (Signed)
Spoke with pt and put a copy of labs in mail.

## 2011-08-25 ENCOUNTER — Telehealth: Payer: Self-pay | Admitting: Cardiology

## 2011-08-25 NOTE — Telephone Encounter (Signed)
We had a long conversation about this in the office.  The information he is stating from the [facility] is incorrect.  I suspect that this is likely not what they said or he was not talking to a provider familiar with his case.  Risk of stroke increases with age and other factors clearly defined in the CHADS risk score.  There is no role for ASA in preventing thromboembolic stroke in patients with atrial fibrillation.  He can be referred to the [employer] [facility] web site for more information.  My recommendations are as we discussed at the office meeting.

## 2011-08-25 NOTE — Telephone Encounter (Signed)
Pt is expressing concern related to start Pradaxa.  He has discussed Pradaxa with the provider at the [facility] and they do not RX Pradaxa and/or Coumadin for anyone over the age of 70.  They suggest he take only ASA daily.  Instructed the pt that with At Fib he is at less risk for stroke if on Pradaxa or Coumadin but that I will forward information to Dr Antoine Poche for his recommendations.  Pt states understanding and is aware we will contact him.

## 2011-08-25 NOTE — Telephone Encounter (Signed)
Pt calling to speak with Pam. Please return pt call to discuss further.  If not able to reach pt home please try pt cell: [phone]

## 2011-08-26 NOTE — Telephone Encounter (Signed)
Spoke with pt and informed him that Dr Hochrein's recommendations have not changed since they discussed this at the pt's last office visit.  Dr Antoine Poche does recommend pt either be on Coumadin or Pradaxa.  Pt states he will think about it and call back with further needs.  He was counseled on his increase risk of stroke in his situation.

## 2011-08-31 ENCOUNTER — Telehealth: Payer: Self-pay | Admitting: Cardiology

## 2011-08-31 DIAGNOSIS — I4891 Unspecified atrial fibrillation: Secondary | ICD-10-CM

## 2011-08-31 NOTE — Telephone Encounter (Signed)
Pt called and wanted to ask about Pradaxa.  It is very expensive.  He wants the cheap one Warfarin.  Please call him back regarding this.

## 2011-08-31 NOTE — Telephone Encounter (Signed)
Pt has not RX coverage with his insurance.  He generally gets his RX's thru the [place] and they dont use Pradaxa (per pt).  The cash price for Pradaxa at [facility] is $280/month according to pt when he called.  There is no assistance for pts paying the cash price.  Pt would like to use Coumadin as it will be appr $18/ month for him.  I instructed him I would inform Dr Antoine Poche and call him back with orders in regards to starting Coumadin.  He is in agreement.

## 2011-09-01 NOTE — Telephone Encounter (Signed)
OK to start Coumadin per our clinic.

## 2011-09-02 MED ORDER — WARFARIN SODIUM 5 MG PO TABS: 5.0000 mg | ORAL_TABLET | Freq: Every day | ORAL | Status: DC

## 2011-09-02 NOTE — Telephone Encounter (Signed)
Pt voiced concerns about starting coumadin.  Instructed pt that he is atrisk for CVA d/t A Fib.  He states understanding but is concerned about bruising and bleeding if he should have a kidney stone.  Instructed patient that his blood will be checked frequently and if he should need to come off of the drug for treatment of kidney stones it would be handled at that time.  Pt agreed to start coumadin.  Rx for 5 mg a day sent into [facility].  Pt to come in on Monday to have PT/INR.

## 2011-09-06 ENCOUNTER — Ambulatory Visit (INDEPENDENT_AMBULATORY_CARE_PROVIDER_SITE_OTHER): Payer: Medicare Other | Admitting: *Deleted

## 2011-09-06 DIAGNOSIS — Z7901 Long term (current) use of anticoagulants: Secondary | ICD-10-CM | POA: Insufficient documentation

## 2011-09-06 DIAGNOSIS — I4891 Unspecified atrial fibrillation: Secondary | ICD-10-CM

## 2011-09-06 LAB — POCT INR: INR: 1.3

## 2011-09-14 ENCOUNTER — Ambulatory Visit (INDEPENDENT_AMBULATORY_CARE_PROVIDER_SITE_OTHER): Payer: Medicare Other | Admitting: *Deleted

## 2011-09-14 DIAGNOSIS — I4891 Unspecified atrial fibrillation: Secondary | ICD-10-CM

## 2011-09-14 DIAGNOSIS — Z7901 Long term (current) use of anticoagulants: Secondary | ICD-10-CM

## 2011-09-14 LAB — POCT INR: INR: 2.2

## 2011-09-15 ENCOUNTER — Telehealth: Payer: Self-pay | Admitting: Cardiology

## 2011-09-15 NOTE — Telephone Encounter (Signed)
Wife calling stating her husband has been in Afib for about 6 hrs now. Spoke w/ [name]. States he went into Afib about 10:15 this AM. Took 100 mg of Atenolol around 11:15. Doesn't know what heart rate is. Has not taken BP or HR. States he feels fine now at about 4:40. Thinks he is back in rhythm. States he will take his regular dose of Metoprolol tonight. States he and Dr. Antoine Poche have discussed what to do if he goes into Afib. Offered him an Optometrist w/[name]. He states he feels fine now but if goes back out of rhythm will call in AM. He verbalizes understanding of his condition and will call back if needs to be seen.

## 2011-09-15 NOTE — Telephone Encounter (Signed)
Pt's husband is in afib and she wants to know what to do

## 2011-09-16 ENCOUNTER — Ambulatory Visit: Payer: Medicare Other | Admitting: Physician Assistant

## 2011-09-28 ENCOUNTER — Ambulatory Visit (INDEPENDENT_AMBULATORY_CARE_PROVIDER_SITE_OTHER): Payer: Medicare Other | Admitting: *Deleted

## 2011-09-28 DIAGNOSIS — I4891 Unspecified atrial fibrillation: Secondary | ICD-10-CM

## 2011-09-28 DIAGNOSIS — Z7901 Long term (current) use of anticoagulants: Secondary | ICD-10-CM

## 2011-09-28 LAB — POCT INR: INR: 2.6

## 2011-10-11 ENCOUNTER — Ambulatory Visit (INDEPENDENT_AMBULATORY_CARE_PROVIDER_SITE_OTHER): Payer: Medicare Other | Admitting: *Deleted

## 2011-10-11 DIAGNOSIS — I4891 Unspecified atrial fibrillation: Secondary | ICD-10-CM

## 2011-10-11 DIAGNOSIS — Z7901 Long term (current) use of anticoagulants: Secondary | ICD-10-CM

## 2011-10-12 ENCOUNTER — Telehealth: Payer: Self-pay | Admitting: Family Medicine

## 2011-10-12 ENCOUNTER — Encounter: Payer: Medicare Other | Admitting: *Deleted

## 2011-10-12 MED ORDER — PROMETHAZINE HCL 25 MG PO TABS: 25.0000 mg | ORAL_TABLET | ORAL | Status: AC | PRN

## 2011-10-12 MED ORDER — DIPHENOXYLATE-ATROPINE 2.5-0.025 MG PO TABS: 1.0000 | ORAL_TABLET | Freq: Four times a day (QID) | ORAL | Status: DC | PRN

## 2011-10-12 NOTE — Telephone Encounter (Signed)
Call in Phenergan 25 mg tabs to use q 4 hours prn nausea, #60 with 2 rf. Also call in Lomotil 2 tabs q 6 hours prn diarrhea, #60 with no rf

## 2011-10-12 NOTE — Telephone Encounter (Signed)
Pt has severe diarrhea,vomitting since last night. Pt needs to know what he should do? Pt is wondering if there is a diarrhea med that could be prescribed? Pls call in to [facility].

## 2011-10-12 NOTE — Telephone Encounter (Signed)
Scripts called in and pt aware. 

## 2011-10-12 NOTE — Telephone Encounter (Signed)
Pt wife is aware waiting on MD

## 2011-10-25 ENCOUNTER — Ambulatory Visit (INDEPENDENT_AMBULATORY_CARE_PROVIDER_SITE_OTHER): Payer: Medicare Other | Admitting: *Deleted

## 2011-10-25 DIAGNOSIS — Z7901 Long term (current) use of anticoagulants: Secondary | ICD-10-CM

## 2011-10-25 DIAGNOSIS — I4891 Unspecified atrial fibrillation: Secondary | ICD-10-CM

## 2011-10-25 LAB — POCT INR: INR: 2.3

## 2011-11-10 DIAGNOSIS — N200 Calculus of kidney: Secondary | ICD-10-CM | POA: Diagnosis not present

## 2011-11-10 DIAGNOSIS — N401 Enlarged prostate with lower urinary tract symptoms: Secondary | ICD-10-CM | POA: Diagnosis not present

## 2011-11-15 ENCOUNTER — Ambulatory Visit (INDEPENDENT_AMBULATORY_CARE_PROVIDER_SITE_OTHER): Payer: Medicare Other | Admitting: *Deleted

## 2011-11-15 DIAGNOSIS — Z7901 Long term (current) use of anticoagulants: Secondary | ICD-10-CM | POA: Diagnosis not present

## 2011-11-15 DIAGNOSIS — I4891 Unspecified atrial fibrillation: Secondary | ICD-10-CM | POA: Diagnosis not present

## 2011-11-18 DIAGNOSIS — H409 Unspecified glaucoma: Secondary | ICD-10-CM | POA: Diagnosis not present

## 2011-11-18 DIAGNOSIS — H4011X0 Primary open-angle glaucoma, stage unspecified: Secondary | ICD-10-CM | POA: Diagnosis not present

## 2011-12-14 ENCOUNTER — Encounter: Payer: Self-pay | Admitting: Cardiology

## 2011-12-14 ENCOUNTER — Ambulatory Visit (INDEPENDENT_AMBULATORY_CARE_PROVIDER_SITE_OTHER): Payer: Medicare Other | Admitting: Cardiology

## 2011-12-14 ENCOUNTER — Ambulatory Visit (INDEPENDENT_AMBULATORY_CARE_PROVIDER_SITE_OTHER): Payer: Medicare Other | Admitting: *Deleted

## 2011-12-14 DIAGNOSIS — I10 Essential (primary) hypertension: Secondary | ICD-10-CM | POA: Diagnosis not present

## 2011-12-14 DIAGNOSIS — I4891 Unspecified atrial fibrillation: Secondary | ICD-10-CM | POA: Diagnosis not present

## 2011-12-14 DIAGNOSIS — Z7901 Long term (current) use of anticoagulants: Secondary | ICD-10-CM | POA: Diagnosis not present

## 2011-12-14 NOTE — Assessment & Plan Note (Signed)
He has had no symptomatic recurrences of this.  He continue with the meds as listed.  He tolerates his warfarin.

## 2011-12-14 NOTE — Patient Instructions (Addendum)
The current medical regimen is effective;  continue present plan and medications.  Follow up in 1 year with Dr Hochrein.  You will receive a letter in the mail 2 months before you are due.  Please call us when you receive this letter to schedule your follow up appointment.  

## 2011-12-14 NOTE — Progress Notes (Addendum)
HPI The patient presents for follow up of atrial fibrillation.  Since I last saw him he has done well. The patient denies any new symptoms such as chest discomfort, neck or arm discomfort. There has been no new shortness of breath, PND or orthopnea. There have been no reported palpitations, presyncope or syncope.  He has had no symptomatic recurrence of atrial fibrillation. He exercises daily without symptoms.  Allergies  Allergen Reactions  . Sulfamethoxazole     REACTION: rash    Current Outpatient Prescriptions  Medication Sig Dispense Refill  . aspirin 325 MG tablet Take 325 mg by mouth daily.        . cetirizine (ZYRTEC) 10 MG tablet Take 10 mg by mouth daily.        . diphenoxylate-atropine (LOMOTIL) 2.5-0.025 MG per tablet Take 1 tablet by mouth 4 (four) times daily as needed for diarrhea/loose stools.  60 tablet  0  . latanoprost (XALATAN) 0.005 % ophthalmic solution Place 1 drop into both eyes daily.        Marland Kitchen lisinopril (PRINIVIL,ZESTRIL) 10 MG tablet Take 10 mg by mouth daily.        Marland Kitchen loratadine (CLARITIN) 10 MG tablet Take 10 mg by mouth daily.        . metoprolol (LOPRESSOR) 50 MG tablet Take 50 mg by mouth 2 (two) times daily.        . mometasone (NASONEX) 50 MCG/ACT nasal spray Place 2 sprays into the nose daily.        . Multiple Vitamins-Minerals (CENTRUM SILVER PO) Take 1 tablet by mouth daily.        Marland Kitchen omeprazole (PRILOSEC) 20 MG capsule Take 20 mg by mouth 2 (two) times daily.       . potassium chloride (KLOR-CON) 10 MEQ CR tablet Take 10 mEq by mouth daily.        . Psyllium (METAMUCIL PO) Take by mouth 2 (two) times daily.        . simvastatin (ZOCOR) 40 MG tablet Take 40 mg by mouth at bedtime.        Marland Kitchen warfarin (COUMADIN) 5 MG tablet Take 1 tablet (5 mg total) by mouth daily.  30 tablet  11    Past Medical History  Diagnosis Date  . Allergic rhinitis   . Hyperlipidemia   . HTN (hypertension)   . Low back pain   . Paroxysmal atrial fibrillation   . GERD  (gastroesophageal reflux disease)   . Glaucoma   . Hx of colonic polyps   . Nephrolithiasis   . S/P colonoscopy     Past Surgical History  Procedure Date  . Cholecystectomy   . Vasectomy   . Right inguinal hernia repair   . Refractive surgery   . Ruptured blood vessel in the bladder   . Removed 2 stones from left ureter   . Blood vessel repair in bladder     ROS:  As stated in the HPI and negative for all other systems.   PHYSICAL EXAM BP 140/75  Pulse 54  Ht 5\' 11"  (1.803 m)  Wt 216 lb (97.977 kg)  BMI 30.13 kg/m2 GENERAL:  Well appearing HEENT:  Pupils equal round and reactive, fundi not visualized, oral mucosa unremarkable NECK:  No jugular venous distention, waveform within normal limits, carotid upstroke brisk and symmetric, no bruits, no thyromegaly LYMPHATICS:  No cervical, inguinal adenopathy LUNGS:  Clear to auscultation bilaterally BACK:  No CVA tenderness CHEST:  Unremarkable HEART:  PMI not  displaced or sustained,S1 and S2 within normal limits, no S3, no S4, no clicks, no rubs, no murmurs ABD:  Flat, positive bowel sounds normal in frequency in pitch, no bruits, no rebound, no guarding, no midline pulsatile mass, no hepatomegaly, no splenomegaly EXT:  2 plus pulses throughout, no edema, no cyanosis no clubbing SKIN:  No rashes no nodules NEURO:  Cranial nerves II through XII grossly intact, motor grossly intact throughout PSYCH:  Cognitively intact, oriented to person place and time  EKG:  Sinus rhythm, rate 54, axis within normal limits, intervals within normal limits, no acute ST-T wave changes.  12/14/2011  ASSESSMENT AND PLAN

## 2011-12-14 NOTE — Assessment & Plan Note (Signed)
The blood pressure is at target. No change in medications is indicated. We will continue with therapeutic lifestyle changes (TLC).  

## 2012-01-05 DIAGNOSIS — D1801 Hemangioma of skin and subcutaneous tissue: Secondary | ICD-10-CM | POA: Diagnosis not present

## 2012-01-05 DIAGNOSIS — L570 Actinic keratosis: Secondary | ICD-10-CM | POA: Diagnosis not present

## 2012-01-05 DIAGNOSIS — Z85820 Personal history of malignant melanoma of skin: Secondary | ICD-10-CM | POA: Diagnosis not present

## 2012-01-11 ENCOUNTER — Ambulatory Visit (INDEPENDENT_AMBULATORY_CARE_PROVIDER_SITE_OTHER): Payer: Medicare Other

## 2012-01-11 DIAGNOSIS — Z7901 Long term (current) use of anticoagulants: Secondary | ICD-10-CM

## 2012-01-11 DIAGNOSIS — I4891 Unspecified atrial fibrillation: Secondary | ICD-10-CM

## 2012-01-24 ENCOUNTER — Encounter (HOSPITAL_COMMUNITY): Payer: Self-pay | Admitting: Emergency Medicine

## 2012-01-24 ENCOUNTER — Other Ambulatory Visit: Payer: Self-pay

## 2012-01-24 ENCOUNTER — Emergency Department (HOSPITAL_COMMUNITY)
Admission: EM | Admit: 2012-01-24 | Discharge: 2012-01-24 | Disposition: A | Discharge status: Home / Self Care | Payer: Medicare Other | Attending: Emergency Medicine | Admitting: Emergency Medicine

## 2012-01-24 ENCOUNTER — Emergency Department (HOSPITAL_COMMUNITY): Payer: Medicare Other

## 2012-01-24 DIAGNOSIS — E785 Hyperlipidemia, unspecified: Secondary | ICD-10-CM | POA: Insufficient documentation

## 2012-01-24 DIAGNOSIS — H409 Unspecified glaucoma: Secondary | ICD-10-CM | POA: Diagnosis not present

## 2012-01-24 DIAGNOSIS — K219 Gastro-esophageal reflux disease without esophagitis: Secondary | ICD-10-CM | POA: Insufficient documentation

## 2012-01-24 DIAGNOSIS — R079 Chest pain, unspecified: Secondary | ICD-10-CM | POA: Diagnosis not present

## 2012-01-24 DIAGNOSIS — I10 Essential (primary) hypertension: Secondary | ICD-10-CM | POA: Diagnosis not present

## 2012-01-24 DIAGNOSIS — I498 Other specified cardiac arrhythmias: Secondary | ICD-10-CM | POA: Insufficient documentation

## 2012-01-24 DIAGNOSIS — R918 Other nonspecific abnormal finding of lung field: Secondary | ICD-10-CM | POA: Diagnosis not present

## 2012-01-24 DIAGNOSIS — R001 Bradycardia, unspecified: Secondary | ICD-10-CM

## 2012-01-24 DIAGNOSIS — I4949 Other premature depolarization: Secondary | ICD-10-CM | POA: Diagnosis not present

## 2012-01-24 DIAGNOSIS — I4891 Unspecified atrial fibrillation: Secondary | ICD-10-CM | POA: Insufficient documentation

## 2012-01-24 DIAGNOSIS — R5381 Other malaise: Secondary | ICD-10-CM | POA: Diagnosis not present

## 2012-01-24 LAB — COMPREHENSIVE METABOLIC PANEL
ALT: 30 U/L (ref 0–53)
Alkaline Phosphatase: 51 U/L (ref 39–117)
BUN: 22 mg/dL (ref 6–23)
CO2: 28 mEq/L (ref 19–32)
Calcium: 9.1 mg/dL (ref 8.4–10.5)
GFR calc Af Amer: 67 mL/min — ABNORMAL LOW (ref >90)
GFR calc non Af Amer: 58 mL/min — ABNORMAL LOW (ref >90)
Glucose, Bld: 121 mg/dL — ABNORMAL HIGH (ref 70–99)
Sodium: 139 mEq/L (ref 135–145)

## 2012-01-24 LAB — URINALYSIS, ROUTINE W REFLEX MICROSCOPIC
Ketones, ur: NEGATIVE mg/dL
Nitrite: NEGATIVE
Protein, ur: NEGATIVE mg/dL
Urobilinogen, UA: 0.2 mg/dL (ref 0.0–1.0)

## 2012-01-24 LAB — URINE MICROSCOPIC-ADD ON

## 2012-01-24 LAB — CBC
Hemoglobin: 13.2 g/dL (ref 13.0–17.0)
MCHC: 33.8 g/dL (ref 30.0–36.0)
RDW: 13.5 % (ref 11.5–15.5)

## 2012-01-24 LAB — TROPONIN I: Troponin I: <0.3 ng/mL (ref <0.30)

## 2012-01-24 LAB — PROTIME-INR: INR: 2.38 — ABNORMAL HIGH (ref 0.00–1.49)

## 2012-01-24 MED ORDER — METOPROLOL TARTRATE 50 MG PO TABS: 25.0000 mg | ORAL_TABLET | Freq: Two times a day (BID) | ORAL | Status: DC

## 2012-01-24 NOTE — ED Notes (Signed)
Pt to ED for eval of a chest discomfort; pt reports on Saturday he felt his heart racing- and has hx of afib; pt reports that he feels like his heart is racing on and off; pt reports that chest discomfort started Saturday intermittently; pt c/o lightheadedness; denies n/v/d/SOB

## 2012-01-24 NOTE — ED Provider Notes (Signed)
History     CSN: [id number]  Arrival date & time 01/24/12  1825   First MD Initiated Contact with Patient 01/24/12 1848      Chief Complaint  Patient presents with  . Chest Pain    (Consider location/radiation/quality/duration/timing/severity/associated sxs/prior treatment) The history is provided by the patient and the spouse.  pt with hx afib states in past 2 days has felt as if heartbeat irregular. States does not feel rapid, or like prior afib. States symptoms essentially constant for past 2 days. No faintness or syncope. No exertional or episodic chest pain. No assoc sob, nv, or diaphoresis. Denies recent change in meds or new meds. No fever or chills. No leg pain or swelling. States he does feel as if he doesn't have his normal energy level. Pt denies specific exacerbating or alleviating factors.   Past Medical History  Diagnosis Date  . Allergic rhinitis   . Hyperlipidemia   . HTN (hypertension)   . Low back pain   . Paroxysmal atrial fibrillation   . GERD (gastroesophageal reflux disease)   . Glaucoma   . Hx of colonic polyps   . Nephrolithiasis   . S/P colonoscopy     Past Surgical History  Procedure Date  . Cholecystectomy   . Vasectomy   . Right inguinal hernia repair   . Refractive surgery   . Ruptured blood vessel in the bladder   . Removed 2 stones from left ureter   . Blood vessel repair in bladder     Family History  Problem Relation Age of Onset  . Heart attack Father 78    History  Substance Use Topics  . Smoking status: Never Smoker   . Smokeless tobacco: Never Used  . Alcohol Use: No      Review of Systems  Constitutional: Negative for fever.  HENT: Negative for neck pain.   Eyes: Negative for visual disturbance.  Respiratory: Negative for cough and shortness of breath.   Cardiovascular: Negative for chest pain and leg swelling.  Gastrointestinal: Negative for vomiting, abdominal pain and blood in stool.  Genitourinary: Negative for  flank pain.  Musculoskeletal: Negative for back pain.  Skin: Negative for rash.  Neurological: Negative for headaches.  Hematological: Does not bruise/bleed easily.  Psychiatric/Behavioral: Negative for confusion.    Allergies  Sulfa antibiotics and Sulfamethoxazole  Home Medications   Current Outpatient Rx  Name Route Sig Dispense Refill  . CETIRIZINE HCL 10 MG PO TABS Oral Take 10 mg by mouth daily.      Marland Kitchen DIPHENOXYLATE-ATROPINE 2.5-0.025 MG PO TABS Oral Take 1 tablet by mouth 4 (four) times daily as needed for diarrhea/loose stools. 60 tablet 0  . LATANOPROST 0.005 % OP SOLN Both Eyes Place 1 drop into both eyes daily.      Marland Kitchen LISINOPRIL 10 MG PO TABS Oral Take 10 mg by mouth daily.      Marland Kitchen LORATADINE 10 MG PO TABS Oral Take 10 mg by mouth daily.      Marland Kitchen METOPROLOL TARTRATE 50 MG PO TABS Oral Take 50 mg by mouth 2 (two) times daily.      . MOMETASONE FUROATE 50 MCG/ACT NA SUSP Nasal Place 2 sprays into the nose daily.      . CENTRUM SILVER PO Oral Take 1 tablet by mouth daily.      Marland Kitchen OMEPRAZOLE 20 MG PO CPDR Oral Take 20 mg by mouth 2 (two) times daily.     Marland Kitchen POTASSIUM CHLORIDE 10 MEQ PO  TBCR Oral Take 10 mEq by mouth daily.     Marland Kitchen METAMUCIL PO Oral Take by mouth 2 (two) times daily.      Marland Kitchen SIMVASTATIN 40 MG PO TABS Oral Take 40 mg by mouth at bedtime.      . WARFARIN SODIUM 5 MG PO TABS Oral Take 1 tablet (5 mg total) by mouth daily. 30 tablet 11    BP 138/48  Pulse 53  Temp(Src) 98.5 F (36.9 C) (Oral)  SpO2 100%  Physical Exam  Nursing note and vitals reviewed. Constitutional: He is oriented to person, place, and time. He appears well-developed and well-nourished. No distress.  HENT:  Head: Atraumatic.  Eyes: Conjunctivae are normal. Pupils are equal, round, and reactive to light.  Neck: Neck supple. No tracheal deviation present.  Cardiovascular: Normal rate, normal heart sounds and intact distal pulses.  Exam reveals no gallop and no friction rub.   No murmur  heard. Pulmonary/Chest: Effort normal and breath sounds normal. No accessory muscle usage. No respiratory distress.  Abdominal: Soft. He exhibits no distension. There is no tenderness.  Musculoskeletal: Normal range of motion. He exhibits no edema and no tenderness.  Neurological: He is alert and oriented to person, place, and time.       Motor intact bil, steady gait  Skin: Skin is warm and dry.  Psychiatric: He has a normal mood and affect.    ED Course  Procedures (including critical care time)   Labs Reviewed  COMPREHENSIVE METABOLIC PANEL  CBC  TROPONIN I  URINALYSIS, ROUTINE W REFLEX MICROSCOPIC    Results for orders placed during the hospital encounter of 01/24/12  COMPREHENSIVE METABOLIC PANEL      Component Value Range   Sodium 139  135 - 145 (mEq/L)   Potassium 4.2  3.5 - 5.1 (mEq/L)   Chloride 101  96 - 112 (mEq/L)   CO2 28  19 - 32 (mEq/L)   Glucose, Bld 121 (*) 70 - 99 (mg/dL)   BUN 22  6 - 23 (mg/dL)   Creatinine, Ser 6.06  0.50 - 1.35 (mg/dL)   Calcium 9.1  8.4 - 30.1 (mg/dL)   Total Protein 6.6  6.0 - 8.3 (g/dL)   Albumin 3.8  3.5 - 5.2 (g/dL)   AST 29  0 - 37 (U/L)   ALT 30  0 - 53 (U/L)   Alkaline Phosphatase 51  39 - 117 (U/L)   Total Bilirubin 0.4  0.3 - 1.2 (mg/dL)   GFR calc non Af Amer 58 (*) >90 (mL/min)   GFR calc Af Amer 67 (*) >90 (mL/min)  CBC      Component Value Range   WBC 9.2  4.0 - 10.5 (K/uL)   RBC 4.19 (*) 4.22 - 5.81 (MIL/uL)   Hemoglobin 13.2  13.0 - 17.0 (g/dL)   HCT 60.1  09.3 - 23.5 (%)   MCV 93.1  78.0 - 100.0 (fL)   MCH 31.5  26.0 - 34.0 (pg)   MCHC 33.8  30.0 - 36.0 (g/dL)   RDW 57.3  22.0 - 25.4 (%)   Platelets 180  150 - 400 (K/uL)  TROPONIN I      Component Value Range   Troponin I <0.30  <0.30 (ng/mL)  URINALYSIS, ROUTINE W REFLEX MICROSCOPIC      Component Value Range   Color, Urine YELLOW  YELLOW    APPearance CLEAR  CLEAR    Specific Gravity, Urine 1.019  1.005 - 1.030    pH 5.5  5.0 - 8.0    Glucose, UA  NEGATIVE  NEGATIVE (mg/dL)   Hgb urine dipstick SMALL (*) NEGATIVE    Bilirubin Urine NEGATIVE  NEGATIVE    Ketones, ur NEGATIVE  NEGATIVE (mg/dL)   Protein, ur NEGATIVE  NEGATIVE (mg/dL)   Urobilinogen, UA 0.2  0.0 - 1.0 (mg/dL)   Nitrite NEGATIVE  NEGATIVE    Leukocytes, UA TRACE (*) NEGATIVE   PROTIME-INR      Component Value Range   Prothrombin Time 26.4 (*) 11.6 - 15.2 (seconds)   INR 2.38 (*) 0.00 - 1.49   URINE MICROSCOPIC-ADD ON      Component Value Range   Squamous Epithelial / LPF RARE  RARE    WBC, UA 0-2  <3 (WBC/hpf)   RBC / HPF 0-2  <3 (RBC/hpf)   Dg Chest 2 View  01/24/2012  *RADIOLOGY REPORT*  Clinical Data: Left chest pain.  Weakness.  CHEST - 2 VIEW  Comparison: 06/30/2011; report from 08/04/2001.  Findings: Linear opacity peripherally at the right lung base favors scarring or subsegmental atelectasis.  Cardiac and mediastinal contours appear unremarkable.  Thoracic spondylosis noted.  No pleural effusion is identified.  IMPRESSION:  1.  Linear opacity favoring scarring or subsegmental atelectasis peripherally in the right lower lobe. 2.  Thoracic spondylosis.  Original Report Authenticated By: Dellia Cloud, M.D.      MDM      Date: 01/24/2012  Rate: 68  Rhythm: premature ventricular contractions (PVC) and sinus rhythm  QRS Axis: right  Intervals: normal  ST/T Wave abnormalities: normal  Conduction Disutrbances:none  Narrative Interpretation:   Old EKG Reviewed: changes noted  pts hr in 48-56 range. bp normal. No faintness or presyncope. Pt is noted on recent [place] cardiology office note to have hr in low 50's.  ?whether symptomatic due to bradycardia, b blocker. Discussed pt with the [facility] cardiologist on call including symptoms, labs, vitals, current meds, etc. They recommend decreasing metoprolol dose to 25 mg bid, and they will have pt followed up closely in office. Will have pt call office in am.  After symptoms for past 2 days, trop  negative/normal. Lytes wnl.      Suzi Roots, MD 01/24/12 2030

## 2012-01-24 NOTE — Discharge Instructions (Signed)
We discussed your case with the on call cardiologist for Dr Antoine Poche.  He indicates to decrease your metoprolol dose from 50 mg 2x/day to 25 mg 2x/day, and to follow up with them in the office in the next 1-2 days - call office tomorrow morning to arrange that follow up.   Return to ER if worse, persistent fast heart beat, faint, chest pain, trouble breathing, other concern.      Bradycardia Bradycardia is a term for a heart rate (pulse) that, in adults, is slower than 60 beats per minute. A normal rate is 60 to 100 beats per minute. A heart rate below 60 beats per minute may be normal for some adults with healthy hearts. If the rate is too slow, the heart may have trouble pumping the volume of blood the body needs. If the heart rate gets too low, blood flow to the brain may be decreased and may make you feel lightheaded, dizzy, or faint. The heart has a natural pacemaker in the top of the heart called the SA node (sinoatrial or sinus node). This pacemaker sends out regular electrical signals to the muscle of the heart, telling the heart muscle when to beat (contract). The electrical signal travels from the upper parts of the heart (atria) through the AV node (atrioventricular node), to the lower chambers of the heart (ventricles). The ventricles squeeze, pumping the blood from your heart to your lungs and to the rest of your body. CAUSES   Problem with the heart's electrical system.   Problem with the heart's natural pacemaker.   Heart disease, damage, or infection.   Medications.   Problems with minerals and salts (electrolytes).  SYMPTOMS   Fainting (syncope).   Fatigue and weakness.   Shortness of breath (dyspnea).   Chest pain (angina).   Drowsiness.   Confusion.  DIAGNOSIS   An electrocardiogram (ECG) can help your caregiver determine the type of slow heart rate you have.   If the cause is not seen on an ECG, you may need to wear a heart monitor that records your heart  rhythm for several hours or days.   Blood tests.  TREATMENT   Electrolyte supplements.   Medications.   Withholding medication which is causing a slow heart rate.   Pacemaker placement.  SEEK IMMEDIATE MEDICAL CARE IF:   You feel lightheaded or faint.   You develop an irregular heart rate.   You feel chest pain or have trouble breathing.  MAKE SURE YOU:   Understand these instructions.   Will watch your condition.   Will get help right away if you are not doing well or get worse.  Document Released: 07/17/2002 Document Revised: 10/14/2011 Document Reviewed: 06/12/2008 [facility] Patient Information 2012 [place], [place].

## 2012-01-25 ENCOUNTER — Telehealth: Payer: Self-pay | Admitting: Cardiology

## 2012-01-26 ENCOUNTER — Ambulatory Visit (INDEPENDENT_AMBULATORY_CARE_PROVIDER_SITE_OTHER): Payer: Medicare Other | Admitting: Physician Assistant

## 2012-01-26 ENCOUNTER — Encounter: Payer: Self-pay | Admitting: Physician Assistant

## 2012-01-26 ENCOUNTER — Ambulatory Visit (HOSPITAL_COMMUNITY): Payer: Medicare Other

## 2012-01-26 VITALS — BP 138/60 | HR 54 | Wt 213.1 lb

## 2012-01-26 DIAGNOSIS — I495 Sick sinus syndrome: Secondary | ICD-10-CM | POA: Diagnosis not present

## 2012-01-26 DIAGNOSIS — I10 Essential (primary) hypertension: Secondary | ICD-10-CM | POA: Diagnosis not present

## 2012-01-26 DIAGNOSIS — I4949 Other premature depolarization: Secondary | ICD-10-CM

## 2012-01-26 DIAGNOSIS — I493 Ventricular premature depolarization: Secondary | ICD-10-CM

## 2012-01-26 DIAGNOSIS — R079 Chest pain, unspecified: Secondary | ICD-10-CM | POA: Diagnosis not present

## 2012-01-26 DIAGNOSIS — I4891 Unspecified atrial fibrillation: Secondary | ICD-10-CM

## 2012-01-26 MED ORDER — METOPROLOL TARTRATE 50 MG PO TABS: 50.0000 mg | ORAL_TABLET | Freq: Two times a day (BID) | ORAL | Status: DC

## 2012-01-26 NOTE — Assessment & Plan Note (Signed)
Patient has history of atrial fibrillation but has had no evidence of atrial fibrillation in the ER work today. He is on Coumadin

## 2012-01-26 NOTE — Progress Notes (Addendum)
HPI:  This is an 78 year old white male patient of Dr. Antoine Poche who was just seen by him in February 2013 and was doing well. He is followed for paroxysmal atrial fibrillation and is on Coumadin.  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 He then felt exhausted and his heart began to get. He states it does not atrial fibrillation but just skipping every other beat. He ate some lunch and then went out and mowed the lawn for another couple hours. Sunday he felt worse and went to the emergency room worried he was found to be rated cardiac in the 48-56 8 range. Blood pressure was normal. They contacted the cardiologist on call who recommended decreasing his metoprolol to 25 mg b.i.d. His electrolytes were all normal enzymes were normal. On Monday the patient's heart rate start racing over 100 beats per minute so he took his regular dose of metoprolol 50 mg plus his wife's A. Atenolol 100 mg. The palpitations finally subsided. He still is having skipping and says that he feels out of breath when his heart is skipping. He denies any dizziness or presyncope. He said he did have a little pressure in his chest when his heart was skipping on Saturday. His last stress test was many years ago.  Allergies  Allergen Reactions  . Sulfa Antibiotics   . Sulfamethoxazole     REACTION: rash    Current Outpatient Prescriptions on File Prior to Visit  Medication Sig Dispense Refill  . cetirizine (ZYRTEC) 10 MG tablet Take 10 mg by mouth daily.        Marland Kitchen latanoprost (XALATAN) 0.005 % ophthalmic solution Place 1 drop into both eyes daily.        Marland Kitchen lisinopril (PRINIVIL,ZESTRIL) 10 MG tablet Take 10 mg by mouth daily.        Marland Kitchen loratadine (CLARITIN) 10 MG tablet Take 10 mg by mouth daily.        . metoprolol (LOPRESSOR) 50 MG tablet Take 0.5 tablets (25 mg total) by mouth 2 (two) times daily.  60 tablet  0  . mometasone (NASONEX) 50 MCG/ACT nasal spray Place 2 sprays into the nose daily.        .  Multiple Vitamins-Minerals (CENTRUM SILVER PO) Take 1 tablet by mouth daily.        Marland Kitchen omeprazole (PRILOSEC) 20 MG capsule Take 20 mg by mouth 2 (two) times daily.       . potassium chloride (KLOR-CON) 10 MEQ CR tablet Take 10 mEq by mouth daily.       . Psyllium (METAMUCIL PO) Take by mouth 2 (two) times daily.        . simvastatin (ZOCOR) 40 MG tablet Take 40 mg by mouth at bedtime.        Marland Kitchen warfarin (COUMADIN) 5 MG tablet Take 1 tablet (5 mg total) by mouth daily.  30 tablet  11    Past Medical History  Diagnosis Date  . Allergic rhinitis   . Hyperlipidemia   . HTN (hypertension)   . Low back pain   . Paroxysmal atrial fibrillation   . GERD (gastroesophageal reflux disease)   . Glaucoma   . Hx of colonic polyps   . Nephrolithiasis   . S/P colonoscopy     Past Surgical History  Procedure Date  . Cholecystectomy   . Vasectomy   . Right inguinal hernia repair   . Refractive surgery   . Ruptured blood vessel in the bladder   .  Removed 2 stones from left ureter   . Blood vessel repair in bladder     Family History  Problem Relation Age of Onset  . Heart attack Father 47    History   Social History  . Marital Status: Married    Spouse Name: N/A    Number of Children: 2  . Years of Education: N/A   Occupational History  . Retired    Social History Main Topics  . Smoking status: Never Smoker   . Smokeless tobacco: Never Used  . Alcohol Use: No  . Drug Use: No  . Sexually Active: Not on file   Other Topics Concern  . Not on file   Social History Narrative  . No narrative on file    ROS:see history of present illness otherwise negative   PHYSICAL EXAM: Well-nournished, in no acute distress. Neck: No JVD, HJR, Bruit, or thyroid enlargement Lungs: No tachypnea, clear without wheezing, rales, or rhonchi Cardiovascular: RRR with some skipping, PMI not displaced, heart sounds normal, no murmurs, gallops, bruit, thrill, or heave. Abdomen: BS normal. Soft  without organomegaly, masses, lesions or tenderness. Extremities: without cyanosis, clubbing or edema. Good distal pulses bilateral SKin: Warm, no lesions or rashes  Musculoskeletal: No deformities Neuro: no focal signs  BP 138/60  Pulse 54  Wt 213 lb 1.9 oz (96.671 kg)   ONG:EXBMW bradycardia at 50 beats per minute otherwise normal followup rhythm strip shows frequent PVCs   I have interviewed and examined the patient and I agree with the findings above and plan as outlined below. He has had palpitations as described and tachycardia. He feels weak as a primary complaint. The patient exam reveals clear lungs, RRR, Ext: No edema, ABD: Positive bowel sounds, no rebound no guarding. All available labs, radiology testing, previous records reviewed. Assessment and plan: He will need a 24 hour Holter. I will also check a stress perfusion study and echocardiogram given the change in his condition. Fayrene Fearing Hochrein 12:37 PM 09/24/2011

## 2012-01-26 NOTE — Assessment & Plan Note (Addendum)
Patient has evidence of tachycardia-bradycardia syndrome with PVCs which he is very symptomatic with. We will try to place a Holter monitor on him today. We'll also check a 2-D echo and stress Myoview to rule out ischemia.

## 2012-01-26 NOTE — Assessment & Plan Note (Signed)
stable °

## 2012-01-26 NOTE — Patient Instructions (Signed)
Your physician has recommended that you wear a 48 hour holter monitor. Holter monitors are medical devices that record the heart's electrical activity. Doctors most often use these monitors to diagnose arrhythmias. Arrhythmias are problems with the speed or rhythm of the heartbeat. The monitor is a small, portable device. You can wear one while you do your normal daily activities. This is usually used to diagnose what is causing palpitations/syncope (passing out).  Dr Antoine Poche is requesting that you have this done ASAP   Your physician has requested that you have an echocardiogram. Echocardiography is a painless test that uses sound waves to create images of your heart. It provides your doctor with information about the size and shape of your heart and how well your heart's chambers and valves are working. This procedure takes approximately one hour. There are no restrictions for this procedure.  Your physician has requested that you have en exercise stress myoview. For further information please visit [email or web address] Please follow instruction sheet, as given.  Your physician has recommended you make the following change in your medication: Increase Lopressor 50, take one tablet twice daily   Your physician recommends that you schedule a follow-up appointment early next week with Dr. Antoine Poche.  (PER DR.  HOCHREIN)

## 2012-01-26 NOTE — Assessment & Plan Note (Signed)
Patient is having mild chest pressure with his PVCs. We will order a stress Myoview.

## 2012-01-27 ENCOUNTER — Other Ambulatory Visit: Payer: Self-pay | Admitting: *Deleted

## 2012-01-27 ENCOUNTER — Ambulatory Visit (HOSPITAL_COMMUNITY): Payer: Medicare Other | Attending: Cardiovascular Disease | Admitting: Radiology

## 2012-01-27 VITALS — BP 150/68 | Ht 71.0 in | Wt 207.0 lb

## 2012-01-27 DIAGNOSIS — I4891 Unspecified atrial fibrillation: Secondary | ICD-10-CM

## 2012-01-27 DIAGNOSIS — I10 Essential (primary) hypertension: Secondary | ICD-10-CM | POA: Diagnosis not present

## 2012-01-27 DIAGNOSIS — E785 Hyperlipidemia, unspecified: Secondary | ICD-10-CM | POA: Insufficient documentation

## 2012-01-27 DIAGNOSIS — I4949 Other premature depolarization: Secondary | ICD-10-CM | POA: Diagnosis not present

## 2012-01-27 DIAGNOSIS — R0789 Other chest pain: Secondary | ICD-10-CM | POA: Diagnosis not present

## 2012-01-27 DIAGNOSIS — R0602 Shortness of breath: Secondary | ICD-10-CM | POA: Insufficient documentation

## 2012-01-27 DIAGNOSIS — R079 Chest pain, unspecified: Secondary | ICD-10-CM | POA: Diagnosis not present

## 2012-01-27 DIAGNOSIS — I779 Disorder of arteries and arterioles, unspecified: Secondary | ICD-10-CM | POA: Insufficient documentation

## 2012-01-27 MED ORDER — TECHNETIUM TC 99M TETROFOSMIN IV KIT
11.0000 | PACK | Freq: Once | INTRAVENOUS | Status: AC | PRN
  Administered 2012-01-27: 11 via INTRAVENOUS

## 2012-01-27 MED ORDER — TECHNETIUM TC 99M TETROFOSMIN IV KIT
33.0000 | PACK | Freq: Once | INTRAVENOUS | Status: AC | PRN
  Administered 2012-01-27: 33 via INTRAVENOUS

## 2012-01-27 NOTE — Progress Notes (Signed)
[facility] SITE 3 NUCLEAR MED [address] [place] [address] [phone]  Cardiology Nuclear Med Study  [name] KLIEBERT is a 78 y.o. male     MRN : [id number]     DOB: 06/02/1929  Procedure Date: 01/27/2012  Nuclear Med Background Indication for Stress Test:  Evaluation for Ischemia; Patient was in ED 01/24/12 with CP/Palps, (-) Enzymes. History:  '01 ION:GEXBMW, EF=60%; h/o Atrial Fibrillation Cardiac Risk Factors: Carotid Disease, Hypertension and Lipids  Symptoms:  Chest Pressure with Exertion (last episode of chest discomfort was last week) and SOB   Nuclear Pre-Procedure Caffeine/Decaff Intake:  None NPO After: 7:00pm   Lungs:  Clear. IV 0.9% NS with Angio Cath:  20g  IV Site: R Hand  IV Started by:  Bonnita Levan, RN  Chest Size (in):  44 Cup Size: n/a  Height: 5\' 11"  (1.803 m)  Weight:  207 lb (93.895 kg)  BMI:  Body mass index is 28.87 kg/(m^2). Tech Comments:  Patient held Lopressor x 24 hours    Nuclear Med Study 1 or 2 day study: 1 day  Stress Test Type:  Stress  Reading MD: Charlton Haws, MD  Order Authorizing Provider:  [name], MD  Resting Radionuclide: Technetium 1m Tetrofosmin  Resting Radionuclide Dose: 11.0 mCi   Stress Radionuclide:  Technetium 87m Tetrofosmin  Stress Radionuclide Dose: 33.0 mCi           Stress Protocol Rest HR: 50 Stress HR: 133  Rest BP: 150/68 Stress BP: 209/76  Exercise Time (min): 9:00 METS: 10.1   Predicted Max HR: 140 bpm % Max HR: 95 bpm Rate Pressure Product: 41324   Dose of Adenosine (mg):  n/a Dose of Lexiscan: n/a mg  Dose of Atropine (mg): n/a Dose of Dobutamine: n/a mcg/kg/min (at max HR)  Stress Test Technologist: Smiley Houseman, CMA-N  Nuclear Technologist:  Domenic Polite, CNMT     Rest Procedure:  Myocardial perfusion imaging was performed at rest 45 minutes following the intravenous administration of Technetium 22m Tetrofosmin.  Rest ECG: Sinus bradycardia.  Stress Procedure:   The patient exercised for 9:00 minutes on the treadmill utilizing the Bruce protocol. He then stopped due to fatigue and denied any chest pain.  There were nonspecific ST-T wave changes, frequent PAC's and occasional PVC's.  Technetium 61m Tetrofosmin was injected at peak exercise and myocardial perfusion imaging was performed after a brief delay.  Stress ECG: No significant change from baseline ECG  QPS Raw Data Images:  Normal; no motion artifact; normal heart/lung ratio. Stress Images:  Normal homogeneous uptake in all areas of the myocardium. Rest Images:  Normal homogeneous uptake in all areas of the myocardium. Subtraction (SDS):  Normal Transient Ischemic Dilatation (Normal <1.22):  0.93 Lung/Heart Ratio (Normal <0.45):  0.30  Quantitative Gated Spect Images QGS EDV:  94 ml QGS ESV:  38 ml  Impression Exercise Capacity:  Good exercise capacity. BP Response:  Hypertensive blood pressure response. Clinical Symptoms:  No significant symptoms noted. ECG Impression:  No significant ST segment change suggestive of ischemia. Comparison with Prior Nuclear Study: No images to compare  Overall Impression:  Normal stress nuclear study.  LV Ejection Fraction: 59%.  LV Wall Motion:  NL LV Function; NL Wall Motion   Charlton Haws

## 2012-01-28 ENCOUNTER — Other Ambulatory Visit: Payer: Self-pay

## 2012-01-28 ENCOUNTER — Ambulatory Visit (HOSPITAL_COMMUNITY): Payer: Medicare Other | Attending: Internal Medicine

## 2012-01-28 DIAGNOSIS — I4891 Unspecified atrial fibrillation: Secondary | ICD-10-CM | POA: Insufficient documentation

## 2012-01-28 DIAGNOSIS — R002 Palpitations: Secondary | ICD-10-CM | POA: Diagnosis not present

## 2012-01-28 DIAGNOSIS — I493 Ventricular premature depolarization: Secondary | ICD-10-CM

## 2012-01-28 DIAGNOSIS — I10 Essential (primary) hypertension: Secondary | ICD-10-CM | POA: Insufficient documentation

## 2012-01-31 ENCOUNTER — Encounter: Payer: Self-pay | Admitting: Cardiology

## 2012-01-31 ENCOUNTER — Ambulatory Visit (INDEPENDENT_AMBULATORY_CARE_PROVIDER_SITE_OTHER): Payer: Medicare Other | Admitting: Cardiology

## 2012-01-31 VITALS — BP 122/65 | HR 52 | Ht 71.0 in | Wt 215.0 lb

## 2012-01-31 DIAGNOSIS — I495 Sick sinus syndrome: Secondary | ICD-10-CM

## 2012-01-31 DIAGNOSIS — I10 Essential (primary) hypertension: Secondary | ICD-10-CM

## 2012-01-31 DIAGNOSIS — I4891 Unspecified atrial fibrillation: Secondary | ICD-10-CM | POA: Diagnosis not present

## 2012-01-31 DIAGNOSIS — R079 Chest pain, unspecified: Secondary | ICD-10-CM

## 2012-01-31 NOTE — Assessment & Plan Note (Signed)
He will for now continue the meds as listed.

## 2012-01-31 NOTE — Patient Instructions (Signed)
Please decrease Metoprolol in half Continue all other medications as listed  Follow up in 6 weeks

## 2012-01-31 NOTE — Progress Notes (Signed)
HPI The patient presents for followup of atrial fibrillation and premature atrial contractions. He was recently seen in the emergency room and then later in this clinic. He was having frequent palpitations. Since that appointment he had an echocardiogram which demonstrated normal wall motion no significant abnormalities.  Stress perfusion imaging demonstrated a normal EF and no evidence of ischemia. Holter monitoring demonstrated sinus bradycardia, PACs, occasional PVCs but no sustained arrhythmia.  Over the weekend he has had some palpitations.  He doesn't think that any of this has been atrial fibrillation.  He does think that his heart rate has been slower as evidenced by the Holter.  He has had no presyncope or syncope.  He denies chest pain, neck or arm pain.  He has had no SOB, PND or orthopnea.     Allergies  Allergen Reactions  . Sulfa Antibiotics   . Sulfamethoxazole     REACTION: rash    Current Outpatient Prescriptions  Medication Sig Dispense Refill  . cetirizine (ZYRTEC) 10 MG tablet Take 10 mg by mouth daily.        Marland Kitchen latanoprost (XALATAN) 0.005 % ophthalmic solution Place 1 drop into both eyes daily.        Marland Kitchen lisinopril (PRINIVIL,ZESTRIL) 10 MG tablet Take 10 mg by mouth daily.        Marland Kitchen loratadine (CLARITIN) 10 MG tablet Take 10 mg by mouth daily.        . metoprolol (LOPRESSOR) 50 MG tablet Take 1 tablet (50 mg total) by mouth 2 (two) times daily.  60 tablet  3  . mometasone (NASONEX) 50 MCG/ACT nasal spray Place 2 sprays into the nose daily.        . Multiple Vitamins-Minerals (CENTRUM SILVER PO) Take 1 tablet by mouth daily.        Marland Kitchen omeprazole (PRILOSEC) 20 MG capsule Take 20 mg by mouth 2 (two) times daily.       . potassium chloride (KLOR-CON) 10 MEQ CR tablet Take 10 mEq by mouth daily.       . Psyllium (METAMUCIL PO) Take by mouth 2 (two) times daily.        . simvastatin (ZOCOR) 40 MG tablet Take 40 mg by mouth at bedtime.        Marland Kitchen warfarin (COUMADIN) 5 MG tablet  Take 1 tablet (5 mg total) by mouth daily.  30 tablet  11    Past Medical History  Diagnosis Date  . Allergic rhinitis   . Hyperlipidemia   . HTN (hypertension)   . Low back pain   . Paroxysmal atrial fibrillation   . GERD (gastroesophageal reflux disease)   . Glaucoma   . Hx of colonic polyps   . Nephrolithiasis   . S/P colonoscopy     Past Surgical History  Procedure Date  . Cholecystectomy   . Vasectomy   . Right inguinal hernia repair   . Refractive surgery   . Ruptured blood vessel in the bladder   . Removed 2 stones from left ureter   . Blood vessel repair in bladder     ROS:  As stated in the HPI and negative for all other systems.   PHYSICAL EXAM BP 122/65  Pulse 52  Ht 5\' 11"  (1.803 m)  Wt 215 lb (97.523 kg)  BMI 29.99 kg/m2 GENERAL:  Well appearing HEENT:  Pupils equal round and reactive, fundi not visualized, oral mucosa unremarkable NECK:  No jugular venous distention, waveform within normal limits, carotid upstroke brisk  and symmetric, no bruits, no thyromegaly LYMPHATICS:  No cervical, inguinal adenopathy LUNGS:  Clear to auscultation bilaterally BACK:  No CVA tenderness CHEST:  Unremarkable HEART:  PMI not displaced or sustained,S1 and S2 within normal limits, no S3, no S4, no clicks, no rubs, no murmurs ABD:  Flat, positive bowel sounds normal in frequency in pitch, no bruits, no rebound, no guarding, no midline pulsatile mass, no hepatomegaly, no splenomegaly EXT:  2 plus pulses throughout, no edema, no cyanosis no clubbing SKIN:  No rashes no nodules NEURO:  Cranial nerves II through XII grossly intact, motor grossly intact throughout PSYCH:  Cognitively intact, oriented to person place and time  EKG:  Sinus rhythm, rate 54, axis within normal limits, intervals within normal limits, no acute ST-T wave changes.  01/31/2012  ASSESSMENT AND PLAN

## 2012-01-31 NOTE — Assessment & Plan Note (Signed)
He has had none of this and has had a negative stress test.  No further testing is indicated.

## 2012-01-31 NOTE — Assessment & Plan Note (Signed)
The blood pressure is at target. No change in medications is indicated. We will continue with therapeutic lifestyle changes (TLC).  

## 2012-01-31 NOTE — Assessment & Plan Note (Signed)
At this point I am going to try to back off on beta blocker as low heart rate is the predominant problem. He might at some point need to be treated with flecainide.  We discussed this and he would like to try adjusting the beta blocker first.

## 2012-02-14 DIAGNOSIS — H4011X0 Primary open-angle glaucoma, stage unspecified: Secondary | ICD-10-CM | POA: Diagnosis not present

## 2012-02-14 DIAGNOSIS — H409 Unspecified glaucoma: Secondary | ICD-10-CM | POA: Diagnosis not present

## 2012-02-15 ENCOUNTER — Ambulatory Visit (INDEPENDENT_AMBULATORY_CARE_PROVIDER_SITE_OTHER): Payer: Medicare Other | Admitting: Pharmacist

## 2012-02-15 DIAGNOSIS — Z7901 Long term (current) use of anticoagulants: Secondary | ICD-10-CM

## 2012-02-15 DIAGNOSIS — I4891 Unspecified atrial fibrillation: Secondary | ICD-10-CM | POA: Diagnosis not present

## 2012-02-15 LAB — POCT INR: INR: 2.5

## 2012-02-24 ENCOUNTER — Ambulatory Visit: Payer: Medicare Other | Admitting: Cardiology

## 2012-03-20 ENCOUNTER — Ambulatory Visit: Payer: Medicare Other | Admitting: Cardiology

## 2012-03-21 ENCOUNTER — Encounter: Payer: Self-pay | Admitting: Cardiology

## 2012-03-21 ENCOUNTER — Ambulatory Visit (INDEPENDENT_AMBULATORY_CARE_PROVIDER_SITE_OTHER): Payer: Medicare Other | Admitting: Cardiology

## 2012-03-21 VITALS — BP 130/60 | HR 60 | Ht 72.0 in | Wt 219.1 lb

## 2012-03-21 DIAGNOSIS — I10 Essential (primary) hypertension: Secondary | ICD-10-CM | POA: Diagnosis not present

## 2012-03-21 DIAGNOSIS — I4891 Unspecified atrial fibrillation: Secondary | ICD-10-CM | POA: Diagnosis not present

## 2012-03-21 DIAGNOSIS — R079 Chest pain, unspecified: Secondary | ICD-10-CM | POA: Diagnosis not present

## 2012-03-21 NOTE — Assessment & Plan Note (Signed)
He is having rare paroxysms. No change in therapy is indicated.

## 2012-03-21 NOTE — Patient Instructions (Signed)
Continue current medications as listed.  Follow up in 6 months with Dr Hochrein.  You will receive a letter in the mail 2 months before you are due.  Please call us when you receive this letter to schedule your follow up appointment.  

## 2012-03-21 NOTE — Assessment & Plan Note (Signed)
He is no longer having chest discomfort.  No further work up is needed.

## 2012-03-21 NOTE — Progress Notes (Signed)
HPI The patient presents for followup of atrial fibrillation and premature atrial contractions. Since I last saw her she has done well.  The patient denies any new symptoms such as chest discomfort, neck or arm discomfort. There has been no new shortness of breath, PND or orthopnea. There have been no reported  presyncope or syncope.  He does have rare palpitations but these are much improved and his heart rate is increased.  He has been exercising routinely.  Allergies  Allergen Reactions  . Sulfa Antibiotics   . Sulfamethoxazole     REACTION: rash    Current Outpatient Prescriptions  Medication Sig Dispense Refill  . cetirizine (ZYRTEC) 10 MG tablet Take 10 mg by mouth daily.        Marland Kitchen latanoprost (XALATAN) 0.005 % ophthalmic solution Place 1 drop into both eyes daily.        Marland Kitchen lisinopril (PRINIVIL,ZESTRIL) 10 MG tablet Take 10 mg by mouth daily.        Marland Kitchen loratadine (CLARITIN) 10 MG tablet Take 10 mg by mouth daily.        . metoprolol tartrate (LOPRESSOR) 25 MG tablet Take 25 mg by mouth 2 (two) times daily.      . mometasone (NASONEX) 50 MCG/ACT nasal spray Place 2 sprays into the nose daily.        . Multiple Vitamins-Minerals (CENTRUM SILVER PO) Take 1 tablet by mouth daily.        Marland Kitchen omeprazole (PRILOSEC) 20 MG capsule Take 20 mg by mouth 2 (two) times daily.       . potassium chloride (KLOR-CON) 10 MEQ CR tablet Take 10 mEq by mouth daily.       . Psyllium (METAMUCIL PO) Take by mouth 2 (two) times daily.        . simvastatin (ZOCOR) 40 MG tablet Take 40 mg by mouth at bedtime.        Marland Kitchen warfarin (COUMADIN) 5 MG tablet Take 1 tablet (5 mg total) by mouth daily.  30 tablet  11    Past Medical History  Diagnosis Date  . Allergic rhinitis   . Hyperlipidemia   . HTN (hypertension)   . Low back pain   . Paroxysmal atrial fibrillation   . GERD (gastroesophageal reflux disease)   . Glaucoma   . Hx of colonic polyps   . Nephrolithiasis   . S/P colonoscopy     Past Surgical  History  Procedure Date  . Cholecystectomy   . Vasectomy   . Right inguinal hernia repair   . Refractive surgery   . Ruptured blood vessel in the bladder   . Removed 2 stones from left ureter   . Blood vessel repair in bladder     ROS:  As stated in the HPI and negative for all other systems.   PHYSICAL EXAM BP 130/60  Pulse 60  Ht 6' (1.829 m)  Wt 219 lb 1.9 oz (99.392 kg)  BMI 29.72 kg/m2 GENERAL:  Well appearing HEENT:  Pupils equal round and reactive, fundi not visualized, oral mucosa unremarkable NECK:  No jugular venous distention, waveform within normal limits, carotid upstroke brisk and symmetric, no bruits, no thyromegaly LYMPHATICS:  No cervical, inguinal adenopathy LUNGS:  Clear to auscultation bilaterally BACK:  No CVA tenderness CHEST:  Unremarkable HEART:  PMI not displaced or sustained,S1 and S2 within normal limits, no S3, no S4, no clicks, no rubs, no murmurs ABD:  Flat, positive bowel sounds normal in frequency in pitch, no  bruits, no rebound, no guarding, no midline pulsatile mass, no hepatomegaly, no splenomegaly EXT:  2 plus pulses throughout, no edema, no cyanosis no clubbing  ASSESSMENT AND PLAN

## 2012-03-21 NOTE — Assessment & Plan Note (Signed)
I reviewed his BP diary.  He will continue with the meds as listed as his BP is at target.

## 2012-03-28 ENCOUNTER — Ambulatory Visit (INDEPENDENT_AMBULATORY_CARE_PROVIDER_SITE_OTHER): Payer: Medicare Other | Admitting: *Deleted

## 2012-03-28 DIAGNOSIS — I4891 Unspecified atrial fibrillation: Secondary | ICD-10-CM

## 2012-03-28 DIAGNOSIS — Z7901 Long term (current) use of anticoagulants: Secondary | ICD-10-CM

## 2012-04-19 ENCOUNTER — Ambulatory Visit (INDEPENDENT_AMBULATORY_CARE_PROVIDER_SITE_OTHER): Payer: Medicare Other | Admitting: *Deleted

## 2012-04-19 DIAGNOSIS — Z7901 Long term (current) use of anticoagulants: Secondary | ICD-10-CM

## 2012-04-19 DIAGNOSIS — I4891 Unspecified atrial fibrillation: Secondary | ICD-10-CM

## 2012-04-19 LAB — POCT INR: INR: 3.2

## 2012-05-08 ENCOUNTER — Encounter: Payer: Self-pay | Admitting: Internal Medicine

## 2012-05-10 ENCOUNTER — Ambulatory Visit (INDEPENDENT_AMBULATORY_CARE_PROVIDER_SITE_OTHER): Payer: Medicare Other | Admitting: Pharmacist

## 2012-05-10 DIAGNOSIS — Z7901 Long term (current) use of anticoagulants: Secondary | ICD-10-CM

## 2012-05-10 DIAGNOSIS — I4891 Unspecified atrial fibrillation: Secondary | ICD-10-CM | POA: Diagnosis not present

## 2012-05-10 LAB — POCT INR: INR: 2.9

## 2012-05-17 DIAGNOSIS — N401 Enlarged prostate with lower urinary tract symptoms: Secondary | ICD-10-CM | POA: Diagnosis not present

## 2012-05-17 DIAGNOSIS — N281 Cyst of kidney, acquired: Secondary | ICD-10-CM | POA: Diagnosis not present

## 2012-05-17 DIAGNOSIS — N200 Calculus of kidney: Secondary | ICD-10-CM | POA: Diagnosis not present

## 2012-05-22 DIAGNOSIS — H409 Unspecified glaucoma: Secondary | ICD-10-CM | POA: Diagnosis not present

## 2012-05-22 DIAGNOSIS — Z961 Presence of intraocular lens: Secondary | ICD-10-CM | POA: Diagnosis not present

## 2012-05-22 DIAGNOSIS — H4011X0 Primary open-angle glaucoma, stage unspecified: Secondary | ICD-10-CM | POA: Diagnosis not present

## 2012-06-06 ENCOUNTER — Ambulatory Visit (INDEPENDENT_AMBULATORY_CARE_PROVIDER_SITE_OTHER): Payer: Medicare Other | Admitting: *Deleted

## 2012-06-06 DIAGNOSIS — Z7901 Long term (current) use of anticoagulants: Secondary | ICD-10-CM | POA: Diagnosis not present

## 2012-06-06 DIAGNOSIS — I4891 Unspecified atrial fibrillation: Secondary | ICD-10-CM

## 2012-06-06 LAB — POCT INR: INR: 2.9

## 2012-06-07 ENCOUNTER — Encounter: Payer: Self-pay | Admitting: Internal Medicine

## 2012-06-07 ENCOUNTER — Ambulatory Visit (INDEPENDENT_AMBULATORY_CARE_PROVIDER_SITE_OTHER): Payer: Medicare Other | Admitting: Internal Medicine

## 2012-06-07 VITALS — BP 128/62 | HR 56 | Ht 71.0 in | Wt 222.6 lb

## 2012-06-07 DIAGNOSIS — Z86010 Personal history of colonic polyps: Secondary | ICD-10-CM

## 2012-06-07 DIAGNOSIS — I4891 Unspecified atrial fibrillation: Secondary | ICD-10-CM | POA: Diagnosis not present

## 2012-06-07 DIAGNOSIS — Z01818 Encounter for other preprocedural examination: Secondary | ICD-10-CM | POA: Diagnosis not present

## 2012-06-07 DIAGNOSIS — Z7901 Long term (current) use of anticoagulants: Secondary | ICD-10-CM

## 2012-06-07 DIAGNOSIS — Z1211 Encounter for screening for malignant neoplasm of colon: Secondary | ICD-10-CM

## 2012-06-07 MED ORDER — MOVIPREP 100 G PO SOLR: ORAL | Status: DC

## 2012-06-07 NOTE — Progress Notes (Signed)
  Subjective:    Patient ID: [name] [name], male    DOB: 27-Dec-1928, 78 y.o.   MRN: [id number]  HPI This elderly white man has a long history of recurrent adenomatous colon polyps. Last had a 7 mm adenoma in 2008. He is 78 but very functional and overall fit. He takes warfarin for stroke prevention in paroxysmal atrial fibrillation.  He has no GI complaints today.  Medications, allergies, past medical history, past surgical history, family history and social history are reviewed and updated in the EMR.    Review of Systems As above    Objective:   Physical Exam General:  NAD Eyes:   anicteric Lungs:  clear Heart:  S1S2 no rubs, murmurs or gallops Abdomen:  soft and nontender, BS+ Ext:   no edema   Data Reviewed:  Lab Results  Component Value Date   WBC 9.2 01/24/2012   HGB 13.2 01/24/2012   HCT 39.0 01/24/2012   MCV 93.1 01/24/2012   PLT 180 01/24/2012      Assessment & Plan:   1. Personal history of colonic polyps  Ambulatory referral to Gastroenterology  2. Special screening for malignant neoplasms, colon  Ambulatory referral to Gastroenterology  3. Preoperative examination, unspecified    4. Atrial fibrillation    5. Warfarin anticoagulation     Repeat screening and surveillance colonoscopy will be scheduled. The risks, benefits, and alternatives to endoscopy with possible biopsy and possible dilation were discussed with the patient and they consent to proceed.  Hold warfarin 3-5 days before colonoscopy to reduce bleeding risks. I have explained this allows for a risk of stroke, very small but real.  I appreciate the opportunity to care for this patient.

## 2012-06-07 NOTE — Patient Instructions (Addendum)
You have been scheduled for a colonoscopy with propofol. Please follow written instructions given to you at your visit today.  Please pick up your prep kit at the pharmacy within the next 1-3 days. If you use inhalers (even only as needed), please bring them with you on the day of your procedure.   You will be contaced by our office prior to your procedure for directions on holding your Coumadin/Warfarin.  If you do not hear from our office 1 week prior to your scheduled procedure, please call [phone] to discuss.  Thank you for choosing me and [facility].  Egan E. Gessner, M.D., FACG  

## 2012-06-14 ENCOUNTER — Telehealth: Payer: Self-pay

## 2012-06-14 NOTE — Telephone Encounter (Signed)
Pt notified that Dr. Antoine Poche has ok'ed pt to be off warfarin 5 days prior to procedure.  Pt verbalized understanding. His last dose of warfarin will be August 16, Hold the 17,18,19,20,21, colon on Aug. 22 2013.

## 2012-06-22 ENCOUNTER — Ambulatory Visit (INDEPENDENT_AMBULATORY_CARE_PROVIDER_SITE_OTHER): Payer: Medicare Other

## 2012-06-22 DIAGNOSIS — Z7901 Long term (current) use of anticoagulants: Secondary | ICD-10-CM | POA: Diagnosis not present

## 2012-06-22 DIAGNOSIS — I4891 Unspecified atrial fibrillation: Secondary | ICD-10-CM

## 2012-06-29 ENCOUNTER — Ambulatory Visit (AMBULATORY_SURGERY_CENTER): Payer: Medicare Other | Admitting: Internal Medicine

## 2012-06-29 ENCOUNTER — Encounter: Payer: Self-pay | Admitting: Internal Medicine

## 2012-06-29 VITALS — BP 149/87 | HR 70 | Temp 99.0°F | Resp 16 | Ht 71.0 in | Wt 222.0 lb

## 2012-06-29 DIAGNOSIS — R000 Tachycardia, unspecified: Secondary | ICD-10-CM | POA: Diagnosis not present

## 2012-06-29 DIAGNOSIS — I10 Essential (primary) hypertension: Secondary | ICD-10-CM | POA: Diagnosis not present

## 2012-06-29 DIAGNOSIS — Z1211 Encounter for screening for malignant neoplasm of colon: Secondary | ICD-10-CM

## 2012-06-29 DIAGNOSIS — G8389 Other specified paralytic syndromes: Secondary | ICD-10-CM | POA: Diagnosis not present

## 2012-06-29 DIAGNOSIS — K219 Gastro-esophageal reflux disease without esophagitis: Secondary | ICD-10-CM | POA: Diagnosis not present

## 2012-06-29 DIAGNOSIS — I4949 Other premature depolarization: Secondary | ICD-10-CM | POA: Diagnosis not present

## 2012-06-29 DIAGNOSIS — D126 Benign neoplasm of colon, unspecified: Secondary | ICD-10-CM

## 2012-06-29 DIAGNOSIS — K5730 Diverticulosis of large intestine without perforation or abscess without bleeding: Secondary | ICD-10-CM

## 2012-06-29 DIAGNOSIS — I4891 Unspecified atrial fibrillation: Secondary | ICD-10-CM | POA: Diagnosis not present

## 2012-06-29 DIAGNOSIS — Z86010 Personal history of colonic polyps: Secondary | ICD-10-CM

## 2012-06-29 DIAGNOSIS — K648 Other hemorrhoids: Secondary | ICD-10-CM

## 2012-06-29 HISTORY — PX: COLONOSCOPY: SHX174

## 2012-06-29 MED ORDER — SODIUM CHLORIDE 0.9 % IV SOLN: 500.0000 mL | INTRAVENOUS | Status: DC

## 2012-06-29 NOTE — Op Note (Addendum)
[facility] [address] [place], [address]   COLONOSCOPY PROCEDURE REPORT  PATIENT: [name] [name], [name] [name]  MR#: [id number] BIRTHDATE: Apr 15, 1929 , 83  yrs. old GENDER: Male ENDOSCOPIST: Iva Boop, MD, [facility] REFERRED BY: PROCEDURE DATE:  06/29/2012 PROCEDURE:   Colonoscopy with cold biopsy polypectomy ASA CLASS:   Class III INDICATIONS:screening and surveillance,personal history of colonic polyps. MEDICATIONS: propofol (Diprivan) 250mg  IV, MAC sedation, administered by CRNA, and These medications were titrated to patient response per physician's verbal order  DESCRIPTION OF PROCEDURE:   After the risks benefits and alternatives of the procedure were thoroughly explained, informed consent was obtained.  A digital rectal exam revealed an enlarged prostate.   The LB CF-[id number]  endoscope was introduced through the anus and advanced to the cecum, which was identified by both the appendix and ileocecal valve. No adverse events experienced.   The quality of the prep was adequate, using MoviPrep The instrument was then slowly withdrawn as the colon was fully examined.      COLON FINDINGS: Five sessile polyps measuring 3-10 mm in size were found throughout the entire examined colon.   Diminutive cecal polyp not recovered, 3 transverse polyps removed the largest 10 mm by hot snare, cold biopsy and cold snare of the other 2 and a diminutive splenic flexure polyp removed but not retrieved by cold snare.A polypectomy was performed with a cold snare, with cold forceps and using snare cautery.  The resection was complete and the polyp tissue was partially retrieved.   There was severe diverticulosis noted throughout the entire examined colon  but most severe in the sigmoid and left colon with associated luminal narrowing.  No bleeding was noted from the diverticulosis.   The colon mucosa was otherwise normal.  Retroflexed views revealed internal hemorrhoids.  The time to cecum=3 minutes 43 seconds Withdrawal time=18 minutes 49 seconds.  The scope was withdrawn and the procedure completed. COMPLICATIONS: There were no complications. ENDOSCOPIC IMPRESSION: 1.   Five sessile polyps measuring 3-10 mm in size were found throughout the entire examined colon; polypectomy was performed with a cold snare, with cold forceps and using snare cautery 2.   There was severe diverticulosis noted throughout the entire examined colon 3.   The colon mucosa was otherwise normal  with adequate prep 4.   Internal hemorrhoids in the rectum  RECOMMENDATIONS: 1.  Resume Coumadin (warfarin) today and have your PT/INR checked within 1 week. 2.  consider routine repeat colonoscopy based upon health status given his current age  eSigned:  Iva Boop, MD, [facility] 06/29/2012 4:35 PM   cc: The Patient   PATIENT NAME:  [name] [name], [name] [name] MR#: [id number]

## 2012-06-29 NOTE — Progress Notes (Signed)
Patient did not have preoperative order for IV antibiotic SSI prophylaxis. ([id number])   

## 2012-06-29 NOTE — Patient Instructions (Addendum)
5 polyps were removed from your colon today. 2 of them were not recovered but they were very small, all of the polyps looked benign.  I will send you a letter about the polyp pathology results. Given your age, he may not need another routine colonoscopy, but we could talk about that in the future.  Please restart her warfarin tonight and see your anticoagulation clinic as planned.  Thank you for choosing me and [facility].  Iva Boop, MD, FACG  YOU HAD AN ENDOSCOPIC PROCEDURE TODAY AT THE [facility]: Refer to the procedure report that was given to you for any specific questions about what was found during the examination.  If the procedure report does not answer your questions, please call your gastroenterologist to clarify.  If you requested that your care partner not be given the details of your procedure findings, then the procedure report has been included in a sealed envelope for you to review at your convenience later.  YOU SHOULD EXPECT: Some feelings of bloating in the abdomen. Passage of more gas than usual.  Walking can help get rid of the air that was put into your GI tract during the procedure and reduce the bloating. If you had a lower endoscopy (such as a colonoscopy or flexible sigmoidoscopy) you may notice spotting of blood in your stool or on the toilet paper. If you underwent a bowel prep for your procedure, then you may not have a normal bowel movement for a few days.  DIET: Your first meal following the procedure should be a light meal and then it is ok to progress to your normal diet.  A half-sandwich or bowl of soup is an example of a good first meal.  Heavy or fried foods are harder to digest and may make you feel nauseous or bloated.  Likewise meals heavy in dairy and vegetables can cause extra gas to form and this can also increase the bloating.  Drink plenty of fluids but you should avoid alcoholic beverages for 24 hours.  ACTIVITY: Your  care partner should take you home directly after the procedure.  You should plan to take it easy, moving slowly for the rest of the day.  You can resume normal activity the day after the procedure however you should NOT DRIVE or use heavy machinery for 24 hours (because of the sedation medicines used during the test).    SYMPTOMS TO REPORT IMMEDIATELY: A gastroenterologist can be reached at any hour.  During normal business hours, 8:30 AM to 5:00 PM Monday through Friday, call [phone].  After hours and on weekends, please call the GI answering service at [phone] who will take a message and have the physician on call contact you.   Following lower endoscopy (colonoscopy or flexible sigmoidoscopy):  Excessive amounts of blood in the stool  Significant tenderness or worsening of abdominal pains  Swelling of the abdomen that is new, acute  Fever of 100F or higher  FOLLOW UP: If any biopsies were taken you will be contacted by phone or by letter within the next 1-3 weeks.  Call your gastroenterologist if you have not heard about the biopsies in 3 weeks.  Our staff will call the home number listed on your records the next business day following your procedure to check on you and address any questions or concerns that you may have at that time regarding the information given to you following your procedure. This is a courtesy call and so if  there is no answer at the home number and we have not heard from you through the emergency physician on call, we will assume that you have returned to your regular daily activities without incident.  SIGNATURES/CONFIDENTIALITY: You and/or your care partner have signed paperwork which will be entered into your electronic medical record.  These signatures attest to the fact that that the information above on your After Visit Summary has been reviewed and is understood.  Full responsibility of the confidentiality of this discharge information lies with you  and/or your care-partner.

## 2012-06-30 ENCOUNTER — Telehealth: Payer: Self-pay | Admitting: *Deleted

## 2012-06-30 NOTE — Telephone Encounter (Signed)
  Follow up Call-  Call back number 06/29/2012  Post procedure Call Back phone  # [phone] HOME/ CELL [phone]  Permission to leave phone message Yes     Patient questions:  Do you have a fever, pain , or abdominal swelling? no Pain Score  0 *  Have you tolerated food without any problems? yes  Have you been able to return to your normal activities? yes  Do you have any questions about your discharge instructions: Diet   no Medications  no Follow up visit  no  Do you have questions or concerns about your Care? no  Actions: * If pain score is 4 or above: No action needed, pain <4.

## 2012-07-05 DIAGNOSIS — D235 Other benign neoplasm of skin of trunk: Secondary | ICD-10-CM | POA: Diagnosis not present

## 2012-07-05 DIAGNOSIS — D485 Neoplasm of uncertain behavior of skin: Secondary | ICD-10-CM | POA: Diagnosis not present

## 2012-07-05 DIAGNOSIS — D1801 Hemangioma of skin and subcutaneous tissue: Secondary | ICD-10-CM | POA: Diagnosis not present

## 2012-07-05 DIAGNOSIS — Z85820 Personal history of malignant melanoma of skin: Secondary | ICD-10-CM | POA: Diagnosis not present

## 2012-07-05 DIAGNOSIS — L570 Actinic keratosis: Secondary | ICD-10-CM | POA: Diagnosis not present

## 2012-07-07 ENCOUNTER — Ambulatory Visit (INDEPENDENT_AMBULATORY_CARE_PROVIDER_SITE_OTHER): Payer: Medicare Other | Admitting: Pharmacist

## 2012-07-07 DIAGNOSIS — I4891 Unspecified atrial fibrillation: Secondary | ICD-10-CM | POA: Diagnosis not present

## 2012-07-07 DIAGNOSIS — Z7901 Long term (current) use of anticoagulants: Secondary | ICD-10-CM | POA: Diagnosis not present

## 2012-07-07 LAB — POCT INR: INR: 1.6

## 2012-07-10 ENCOUNTER — Encounter: Payer: Self-pay | Admitting: Internal Medicine

## 2012-07-10 NOTE — Progress Notes (Signed)
Quick Note:  3 ADENOMAS ON PATH, LARGEST 10 MM 2 DIMINUTIVE POLYPS NOT RECOVERED  Consider repeat colon 3-5 yrs, office visit recall 3 years ______

## 2012-07-18 ENCOUNTER — Ambulatory Visit (INDEPENDENT_AMBULATORY_CARE_PROVIDER_SITE_OTHER): Payer: Medicare Other

## 2012-07-18 DIAGNOSIS — Z7901 Long term (current) use of anticoagulants: Secondary | ICD-10-CM | POA: Diagnosis not present

## 2012-07-18 DIAGNOSIS — I4891 Unspecified atrial fibrillation: Secondary | ICD-10-CM

## 2012-07-24 DIAGNOSIS — H1030 Unspecified acute conjunctivitis, unspecified eye: Secondary | ICD-10-CM | POA: Diagnosis not present

## 2012-08-07 ENCOUNTER — Ambulatory Visit (INDEPENDENT_AMBULATORY_CARE_PROVIDER_SITE_OTHER): Payer: Medicare Other | Admitting: Family Medicine

## 2012-08-07 ENCOUNTER — Encounter: Payer: Self-pay | Admitting: Family Medicine

## 2012-08-07 VITALS — BP 120/68 | HR 65 | Temp 100.1°F | Wt 217.0 lb

## 2012-08-07 DIAGNOSIS — J40 Bronchitis, not specified as acute or chronic: Secondary | ICD-10-CM

## 2012-08-07 MED ORDER — LEVOFLOXACIN 500 MG PO TABS: 500.0000 mg | ORAL_TABLET | Freq: Every day | ORAL | Status: AC

## 2012-08-07 MED ORDER — HYDROCODONE-HOMATROPINE 5-1.5 MG/5ML PO SYRP: 5.0000 mL | ORAL_SOLUTION | ORAL | Status: AC | PRN

## 2012-08-07 NOTE — Progress Notes (Signed)
  Subjective:    Patient ID: [name] [name], male    DOB: 01-02-1929, 78 y.o.   MRN: [id number]  HPI Here for 3 days of fever, PND, ST, and a dry cough. No NVD. He has the same symptoms his wife has had for the past 10 days.    Review of Systems  Constitutional: Positive for fever.  HENT: Positive for congestion, postnasal drip and sinus pressure.   Eyes: Negative.   Respiratory: Positive for cough and chest tightness. Negative for shortness of breath.   Cardiovascular: Negative.        Objective:   Physical Exam  Constitutional: He appears well-developed and well-nourished.  HENT:  Right Ear: External ear normal.  Left Ear: External ear normal.  Nose: Nose normal.  Mouth/Throat: Oropharynx is clear and moist.  Eyes: Conjunctivae normal are normal.  Pulmonary/Chest: Effort normal. No respiratory distress. He has no wheezes. He has no rales.       Scattered rhonchi   Lymphadenopathy:    He has no cervical adenopathy.          Assessment & Plan:  Add Mucinex and Advil prn .

## 2012-08-08 ENCOUNTER — Ambulatory Visit (INDEPENDENT_AMBULATORY_CARE_PROVIDER_SITE_OTHER): Payer: Medicare Other

## 2012-08-08 DIAGNOSIS — Z7901 Long term (current) use of anticoagulants: Secondary | ICD-10-CM

## 2012-08-08 DIAGNOSIS — I4891 Unspecified atrial fibrillation: Secondary | ICD-10-CM

## 2012-08-14 ENCOUNTER — Ambulatory Visit (INDEPENDENT_AMBULATORY_CARE_PROVIDER_SITE_OTHER): Payer: Medicare Other

## 2012-08-14 DIAGNOSIS — I4891 Unspecified atrial fibrillation: Secondary | ICD-10-CM | POA: Diagnosis not present

## 2012-08-14 DIAGNOSIS — Z7901 Long term (current) use of anticoagulants: Secondary | ICD-10-CM | POA: Diagnosis not present

## 2012-08-14 LAB — POCT INR: INR: 2.9

## 2012-08-17 ENCOUNTER — Other Ambulatory Visit: Payer: Self-pay | Admitting: *Deleted

## 2012-08-17 MED ORDER — WARFARIN SODIUM 5 MG PO TABS: 5.0000 mg | ORAL_TABLET | ORAL | Status: DC

## 2012-08-21 ENCOUNTER — Encounter: Payer: Self-pay | Admitting: Family Medicine

## 2012-08-21 ENCOUNTER — Ambulatory Visit (INDEPENDENT_AMBULATORY_CARE_PROVIDER_SITE_OTHER)
Admission: RE | Admit: 2012-08-21 | Discharge: 2012-08-21 | Disposition: A | Discharge status: Home / Self Care | Payer: Medicare Other | Source: Ambulatory Visit | Attending: Family Medicine | Admitting: Family Medicine

## 2012-08-21 ENCOUNTER — Ambulatory Visit (INDEPENDENT_AMBULATORY_CARE_PROVIDER_SITE_OTHER): Payer: Medicare Other | Admitting: Family Medicine

## 2012-08-21 VITALS — BP 118/70 | HR 60 | Temp 98.3°F | Ht 71.0 in | Wt 210.0 lb

## 2012-08-21 DIAGNOSIS — N401 Enlarged prostate with lower urinary tract symptoms: Secondary | ICD-10-CM

## 2012-08-21 DIAGNOSIS — R059 Cough, unspecified: Secondary | ICD-10-CM | POA: Diagnosis not present

## 2012-08-21 DIAGNOSIS — N139 Obstructive and reflux uropathy, unspecified: Secondary | ICD-10-CM | POA: Diagnosis not present

## 2012-08-21 DIAGNOSIS — R05 Cough: Secondary | ICD-10-CM

## 2012-08-21 DIAGNOSIS — I10 Essential (primary) hypertension: Secondary | ICD-10-CM | POA: Diagnosis not present

## 2012-08-21 DIAGNOSIS — I4891 Unspecified atrial fibrillation: Secondary | ICD-10-CM

## 2012-08-21 DIAGNOSIS — N138 Other obstructive and reflux uropathy: Secondary | ICD-10-CM

## 2012-08-21 DIAGNOSIS — Z23 Encounter for immunization: Secondary | ICD-10-CM | POA: Diagnosis not present

## 2012-08-21 DIAGNOSIS — E785 Hyperlipidemia, unspecified: Secondary | ICD-10-CM | POA: Diagnosis not present

## 2012-08-21 LAB — CBC WITH DIFFERENTIAL/PLATELET
Basophils Relative: 0.2 % (ref 0.0–3.0)
Eosinophils Relative: 3.2 % (ref 0.0–5.0)
Lymphocytes Relative: 26.5 % (ref 12.0–46.0)
MCV: 95.9 fl (ref 78.0–100.0)
Monocytes Relative: 10.2 % (ref 3.0–12.0)
Neutrophils Relative %: 59.9 % (ref 43.0–77.0)
RBC: 4.33 Mil/uL (ref 4.22–5.81)
WBC: 10.6 10*3/uL — ABNORMAL HIGH (ref 4.5–10.5)

## 2012-08-21 LAB — BASIC METABOLIC PANEL
BUN: 20 mg/dL (ref 6–23)
GFR: 70.47 mL/min (ref >60.00)
Potassium: 4.3 mEq/L (ref 3.5–5.1)
Sodium: 140 mEq/L (ref 135–145)

## 2012-08-21 LAB — POCT URINALYSIS DIPSTICK
Bilirubin, UA: NEGATIVE
Glucose, UA: NEGATIVE
Leukocytes, UA: NEGATIVE
Nitrite, UA: NEGATIVE

## 2012-08-21 LAB — LIPID PANEL
LDL Cholesterol: 78 mg/dL (ref 0–99)
Total CHOL/HDL Ratio: 4
VLDL: 28.6 mg/dL (ref 0.0–40.0)

## 2012-08-21 LAB — PSA: PSA: 1.22 ng/mL (ref 0.10–4.00)

## 2012-08-21 LAB — HEPATIC FUNCTION PANEL
Albumin: 4.2 g/dL (ref 3.5–5.2)
Alkaline Phosphatase: 51 U/L (ref 39–117)

## 2012-08-21 NOTE — Addendum Note (Signed)
Addended by: Aniceto Boss A on: 08/21/2012 12:27 PM   Modules accepted: Orders

## 2012-08-21 NOTE — Progress Notes (Signed)
  Subjective:    Patient ID: [name] [name], male    DOB: [birth year], 78 y.o.   MRN: [id number]  HPI 78 yr old male for a cpx. He feels well and has no concerns. His recent eye exam showed his intraocular pressures steady at 16. He has no chest pains or palpitations.    Review of Systems  Constitutional: Negative.   HENT: Negative.   Eyes: Negative.   Respiratory: Negative.   Cardiovascular: Negative.   Gastrointestinal: Negative.   Genitourinary: Negative.   Musculoskeletal: Negative.   Skin: Negative.   Neurological: Negative.   Hematological: Negative.   Psychiatric/Behavioral: Negative.        Objective:   Physical Exam  Constitutional: He is oriented to person, place, and time. He appears well-developed and well-nourished. No distress.  HENT:  Head: Normocephalic and atraumatic.  Right Ear: External ear normal.  Left Ear: External ear normal.  Nose: Nose normal.  Mouth/Throat: Oropharynx is clear and moist. No oropharyngeal exudate.  Eyes: Conjunctivae normal and EOM are normal. Pupils are equal, round, and reactive to light. Right eye exhibits no discharge. Left eye exhibits no discharge. No scleral icterus.  Neck: Neck supple. No JVD present. No tracheal deviation present. No thyromegaly present.  Cardiovascular: Normal rate, regular rhythm, normal heart sounds and intact distal pulses.  Exam reveals no gallop and no friction rub.   No murmur heard. Pulmonary/Chest: Effort normal and breath sounds normal. No respiratory distress. He has no wheezes. He has no rales. He exhibits no tenderness.  Abdominal: Soft. Bowel sounds are normal. He exhibits no distension and no mass. There is no tenderness. There is no rebound and no guarding.  Genitourinary: Rectum normal, prostate normal and penis normal. Guaiac negative stool. No penile tenderness.  Musculoskeletal: Normal range of motion. He exhibits no edema and no tenderness.  Lymphadenopathy:    He has no cervical  adenopathy.  Neurological: He is alert and oriented to person, place, and time. He has normal reflexes. No cranial nerve deficit. He exhibits normal muscle tone. Coordination normal.  Skin: Skin is warm and dry. No rash noted. He is not diaphoretic. No erythema. No pallor.  Psychiatric: He has a normal mood and affect. His behavior is normal. Judgment and thought content normal.          Assessment & Plan:  Well exam.

## 2012-08-22 NOTE — Progress Notes (Signed)
Quick Note:  I spoke with pt ______ 

## 2012-08-24 DIAGNOSIS — H4011X0 Primary open-angle glaucoma, stage unspecified: Secondary | ICD-10-CM | POA: Diagnosis not present

## 2012-08-24 DIAGNOSIS — H409 Unspecified glaucoma: Secondary | ICD-10-CM | POA: Diagnosis not present

## 2012-08-24 NOTE — Progress Notes (Signed)
Quick Note:  I left voice message with results. ______ 

## 2012-09-11 ENCOUNTER — Ambulatory Visit (INDEPENDENT_AMBULATORY_CARE_PROVIDER_SITE_OTHER): Payer: Medicare Other | Admitting: *Deleted

## 2012-09-11 DIAGNOSIS — I4891 Unspecified atrial fibrillation: Secondary | ICD-10-CM

## 2012-09-11 DIAGNOSIS — Z7901 Long term (current) use of anticoagulants: Secondary | ICD-10-CM

## 2012-09-11 LAB — POCT INR: INR: 2.2

## 2012-09-20 ENCOUNTER — Encounter: Payer: Self-pay | Admitting: Cardiology

## 2012-09-20 ENCOUNTER — Ambulatory Visit (INDEPENDENT_AMBULATORY_CARE_PROVIDER_SITE_OTHER): Payer: Medicare Other | Admitting: Cardiology

## 2012-09-20 VITALS — BP 151/74 | HR 56 | Ht 71.0 in | Wt 214.4 lb

## 2012-09-20 DIAGNOSIS — E785 Hyperlipidemia, unspecified: Secondary | ICD-10-CM

## 2012-09-20 DIAGNOSIS — I4891 Unspecified atrial fibrillation: Secondary | ICD-10-CM | POA: Diagnosis not present

## 2012-09-20 DIAGNOSIS — I10 Essential (primary) hypertension: Secondary | ICD-10-CM | POA: Diagnosis not present

## 2012-09-20 NOTE — Patient Instructions (Addendum)
The current medical regimen is effective;  continue present plan and medications.  Follow up in 6 months with Dr Hochrein.  You will receive a letter in the mail 2 months before you are due.  Please call us when you receive this letter to schedule your follow up appointment.  

## 2012-09-20 NOTE — Progress Notes (Signed)
HPI The patient presents for followup of atrial fibrillation . Since I last saw him he has done well.  He did have an episode of atrial fibrillation last week for about 17 hours.  He was not particularly symptomatic with this.  The patient denies any new symptoms such as chest discomfort, neck or arm discomfort. There has been no new shortness of breath, PND or orthopnea. There have been no reported  presyncope or syncope.   He has not been exercising as routinely as previous.  Allergies  Allergen Reactions  . Sulfa Antibiotics   . Sulfamethoxazole     REACTION: rash    Current Outpatient Prescriptions  Medication Sig Dispense Refill  . cetirizine (ZYRTEC) 10 MG tablet Take 10 mg by mouth daily.        Marland Kitchen latanoprost (XALATAN) 0.005 % ophthalmic solution Place 1 drop into both eyes daily.        Marland Kitchen lisinopril (PRINIVIL,ZESTRIL) 10 MG tablet Take 10 mg by mouth daily.        Marland Kitchen loratadine (CLARITIN) 10 MG tablet Take 10 mg by mouth daily.        . metoprolol tartrate (LOPRESSOR) 25 MG tablet Take 25 mg by mouth 2 (two) times daily.      . mometasone (NASONEX) 50 MCG/ACT nasal spray Place 2 sprays into the nose daily.        . Multiple Vitamins-Minerals (CENTRUM SILVER PO) Take 1 tablet by mouth daily.        Marland Kitchen omeprazole (PRILOSEC) 20 MG capsule Take 20 mg by mouth 2 (two) times daily.       . potassium chloride (KLOR-CON) 10 MEQ CR tablet Take 10 mEq by mouth daily.       . Psyllium (METAMUCIL PO) Take by mouth 2 (two) times daily.        . simvastatin (ZOCOR) 40 MG tablet Take 40 mg by mouth at bedtime.        Marland Kitchen warfarin (COUMADIN) 5 MG tablet Take 1 tablet (5 mg total) by mouth as directed. Except on Wednesday take 2.5 mg  30 tablet  3    Past Medical History  Diagnosis Date  . Allergic rhinitis   . Hyperlipidemia   . HTN (hypertension)   . Low back pain   . Paroxysmal atrial fibrillation   . GERD (gastroesophageal reflux disease)   . Glaucoma(365)   . Hx of colonic polyps    tubular adenoma   . Nephrolithiasis   . Hemorrhoids     internal  . Pancolonic diverticulosis   . Cancer     FACIAL MELONOMA  . Cataract     REMOVED    Past Surgical History  Procedure Date  . Cholecystectomy   . Vasectomy   . Right inguinal hernia repair   . Refractive surgery   . Ruptured blood vessel in the bladder   . Removed 2 stones from left ureter   . Blood vessel repair in bladder   . Colonoscopy 06-29-12    per Dr. Leone Payor, adenomatous polyps, repeat in 3 yrs     ROS:  As stated in the HPI and negative for all other systems.   PHYSICAL EXAM There were no vitals taken for this visit. GENERAL:  Well appearing HEENT:  Pupils equal round and reactive, fundi not visualized, oral mucosa unremarkable NECK:  No jugular venous distention, waveform within normal limits, carotid upstroke brisk and symmetric, no bruits, no thyromegaly LYMPHATICS:  No cervical, inguinal adenopathy LUNGS:  Clear to auscultation bilaterally BACK:  No CVA tenderness CHEST:  Unremarkable HEART:  PMI not displaced or sustained,S1 and S2 within normal limits, no S3, no S4, no clicks, no rubs, no murmurs ABD:  Flat, positive bowel sounds normal in frequency in pitch, no bruits, no rebound, no guarding, no midline pulsatile mass, no hepatomegaly, no splenomegaly EXT:  2 plus pulses throughout, no edema, no cyanosis no clubbing  EKG:  Sinus rhythm, rate 56, axis within normal limits, intervals within normal limits, no acute ST-T wave changes.  09/20/2012   ASSESSMENT AND PLAN  ATRIAL FIBRILLATION -  He had once recurrence that was not particularly symptomatic. No change in therapy is indicated.  He wants to remain on warfarin.  HYPERTENSION -  I reviewed his BP diary. He will continue with the meds as listed as his BP is at target.

## 2012-10-16 ENCOUNTER — Ambulatory Visit (INDEPENDENT_AMBULATORY_CARE_PROVIDER_SITE_OTHER): Payer: Medicare Other | Admitting: *Deleted

## 2012-10-16 DIAGNOSIS — Z7901 Long term (current) use of anticoagulants: Secondary | ICD-10-CM | POA: Diagnosis not present

## 2012-10-16 DIAGNOSIS — I4891 Unspecified atrial fibrillation: Secondary | ICD-10-CM | POA: Diagnosis not present

## 2012-10-18 DIAGNOSIS — M999 Biomechanical lesion, unspecified: Secondary | ICD-10-CM | POA: Diagnosis not present

## 2012-10-18 DIAGNOSIS — M62838 Other muscle spasm: Secondary | ICD-10-CM | POA: Diagnosis not present

## 2012-10-18 DIAGNOSIS — M5137 Other intervertebral disc degeneration, lumbosacral region: Secondary | ICD-10-CM | POA: Diagnosis not present

## 2012-10-19 DIAGNOSIS — M62838 Other muscle spasm: Secondary | ICD-10-CM | POA: Diagnosis not present

## 2012-10-19 DIAGNOSIS — M999 Biomechanical lesion, unspecified: Secondary | ICD-10-CM | POA: Diagnosis not present

## 2012-10-19 DIAGNOSIS — M5137 Other intervertebral disc degeneration, lumbosacral region: Secondary | ICD-10-CM | POA: Diagnosis not present

## 2012-10-20 DIAGNOSIS — M62838 Other muscle spasm: Secondary | ICD-10-CM | POA: Diagnosis not present

## 2012-10-20 DIAGNOSIS — M999 Biomechanical lesion, unspecified: Secondary | ICD-10-CM | POA: Diagnosis not present

## 2012-10-20 DIAGNOSIS — M5137 Other intervertebral disc degeneration, lumbosacral region: Secondary | ICD-10-CM | POA: Diagnosis not present

## 2012-10-23 ENCOUNTER — Ambulatory Visit: Payer: Medicare Other | Admitting: Family Medicine

## 2012-10-23 DIAGNOSIS — M76899 Other specified enthesopathies of unspecified lower limb, excluding foot: Secondary | ICD-10-CM

## 2012-11-14 DIAGNOSIS — N200 Calculus of kidney: Secondary | ICD-10-CM | POA: Diagnosis not present

## 2012-11-14 DIAGNOSIS — N401 Enlarged prostate with lower urinary tract symptoms: Secondary | ICD-10-CM | POA: Diagnosis not present

## 2012-11-14 DIAGNOSIS — R3129 Other microscopic hematuria: Secondary | ICD-10-CM | POA: Diagnosis not present

## 2012-11-23 DIAGNOSIS — H4011X0 Primary open-angle glaucoma, stage unspecified: Secondary | ICD-10-CM | POA: Diagnosis not present

## 2012-11-23 DIAGNOSIS — H409 Unspecified glaucoma: Secondary | ICD-10-CM | POA: Diagnosis not present

## 2012-11-27 ENCOUNTER — Ambulatory Visit (INDEPENDENT_AMBULATORY_CARE_PROVIDER_SITE_OTHER): Payer: Medicare Other | Admitting: *Deleted

## 2012-11-27 DIAGNOSIS — I4891 Unspecified atrial fibrillation: Secondary | ICD-10-CM | POA: Diagnosis not present

## 2012-11-27 DIAGNOSIS — Z7901 Long term (current) use of anticoagulants: Secondary | ICD-10-CM

## 2012-12-19 ENCOUNTER — Other Ambulatory Visit: Payer: Self-pay | Admitting: *Deleted

## 2012-12-19 MED ORDER — WARFARIN SODIUM 5 MG PO TABS: 5.0000 mg | ORAL_TABLET | ORAL | Status: DC

## 2013-01-03 ENCOUNTER — Other Ambulatory Visit: Payer: Self-pay | Admitting: Dermatology

## 2013-01-03 DIAGNOSIS — Z85820 Personal history of malignant melanoma of skin: Secondary | ICD-10-CM | POA: Diagnosis not present

## 2013-01-03 DIAGNOSIS — L821 Other seborrheic keratosis: Secondary | ICD-10-CM | POA: Diagnosis not present

## 2013-01-03 DIAGNOSIS — L570 Actinic keratosis: Secondary | ICD-10-CM | POA: Diagnosis not present

## 2013-01-03 DIAGNOSIS — D485 Neoplasm of uncertain behavior of skin: Secondary | ICD-10-CM | POA: Diagnosis not present

## 2013-01-03 DIAGNOSIS — D235 Other benign neoplasm of skin of trunk: Secondary | ICD-10-CM | POA: Diagnosis not present

## 2013-01-08 ENCOUNTER — Ambulatory Visit (INDEPENDENT_AMBULATORY_CARE_PROVIDER_SITE_OTHER): Payer: Medicare Other | Admitting: *Deleted

## 2013-01-08 DIAGNOSIS — Z7901 Long term (current) use of anticoagulants: Secondary | ICD-10-CM | POA: Diagnosis not present

## 2013-01-08 DIAGNOSIS — I4891 Unspecified atrial fibrillation: Secondary | ICD-10-CM | POA: Diagnosis not present

## 2013-01-08 LAB — POCT INR: INR: 1.7

## 2013-01-09 IMAGING — CR DG CHEST 2V
2 series · 2 of 2 positions shown · non-contrast
Comparison: January 24, 2012

CLINICAL DATA: Cough

CHEST - 2 VIEW

[view not recorded (1 of 2)]
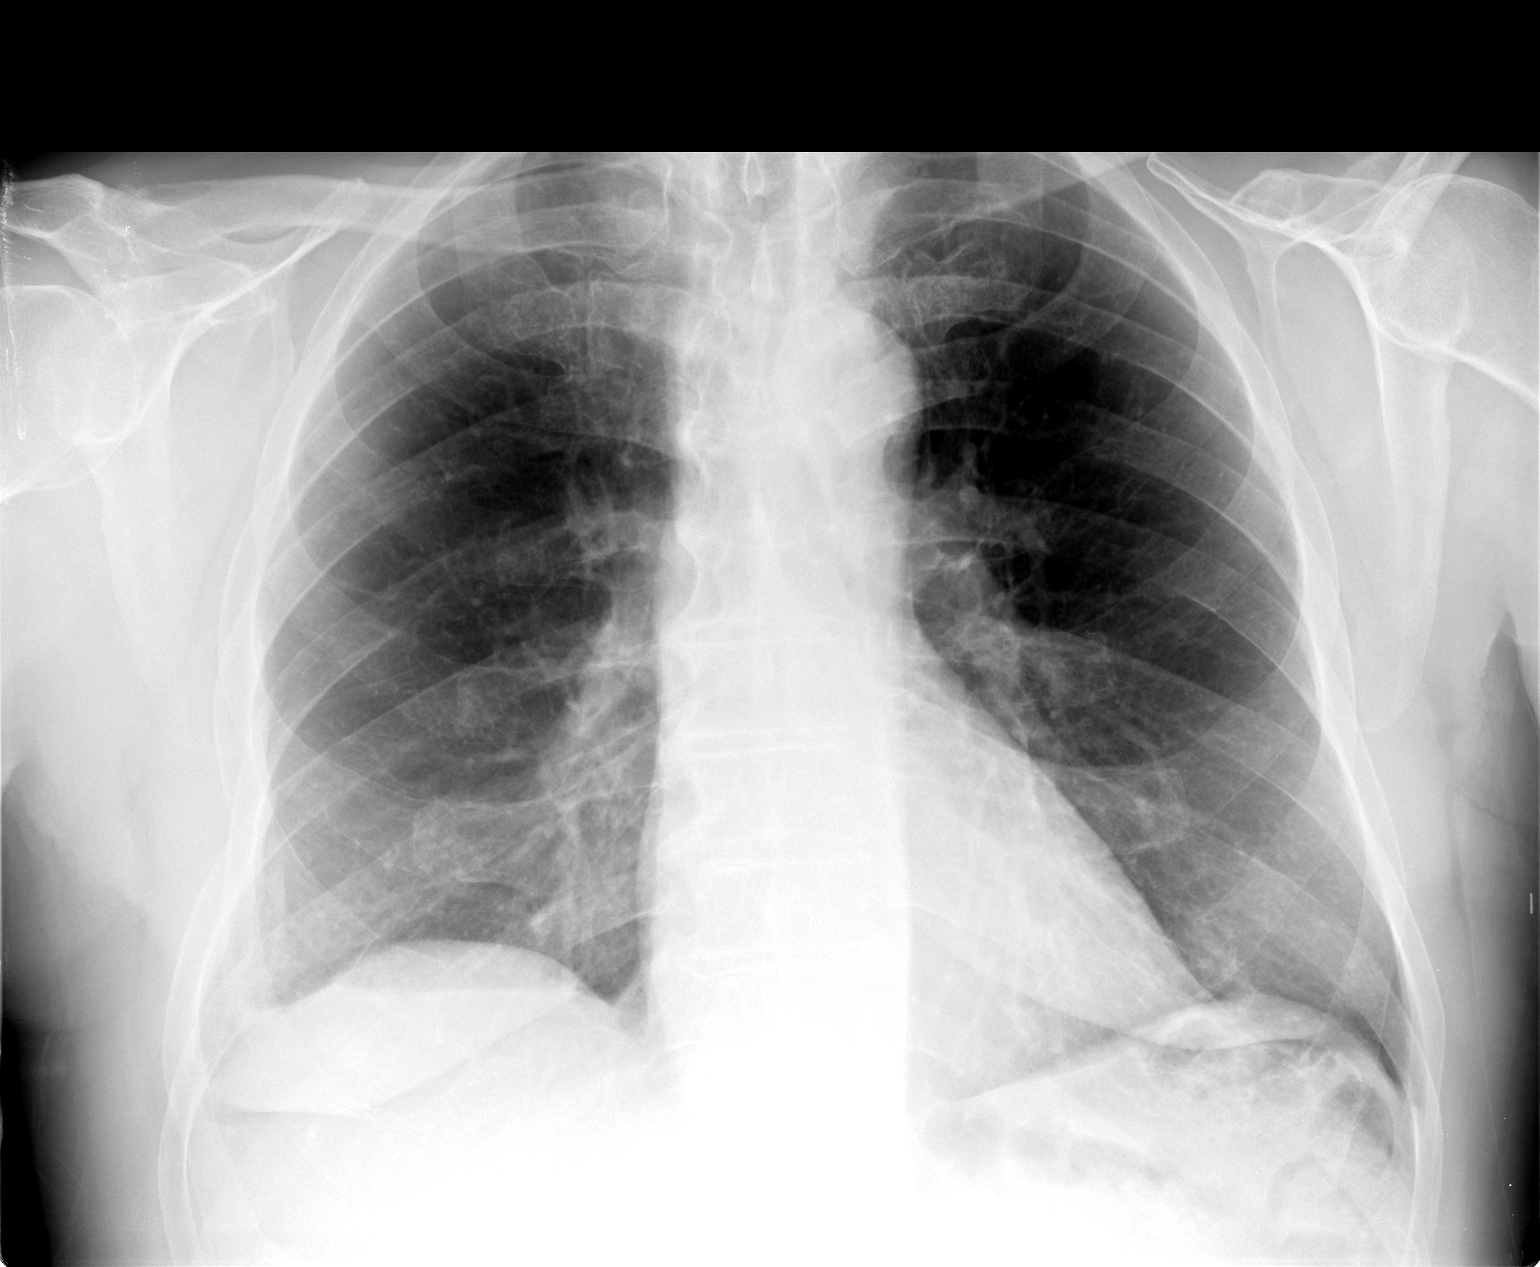

[view not recorded (2 of 2)]
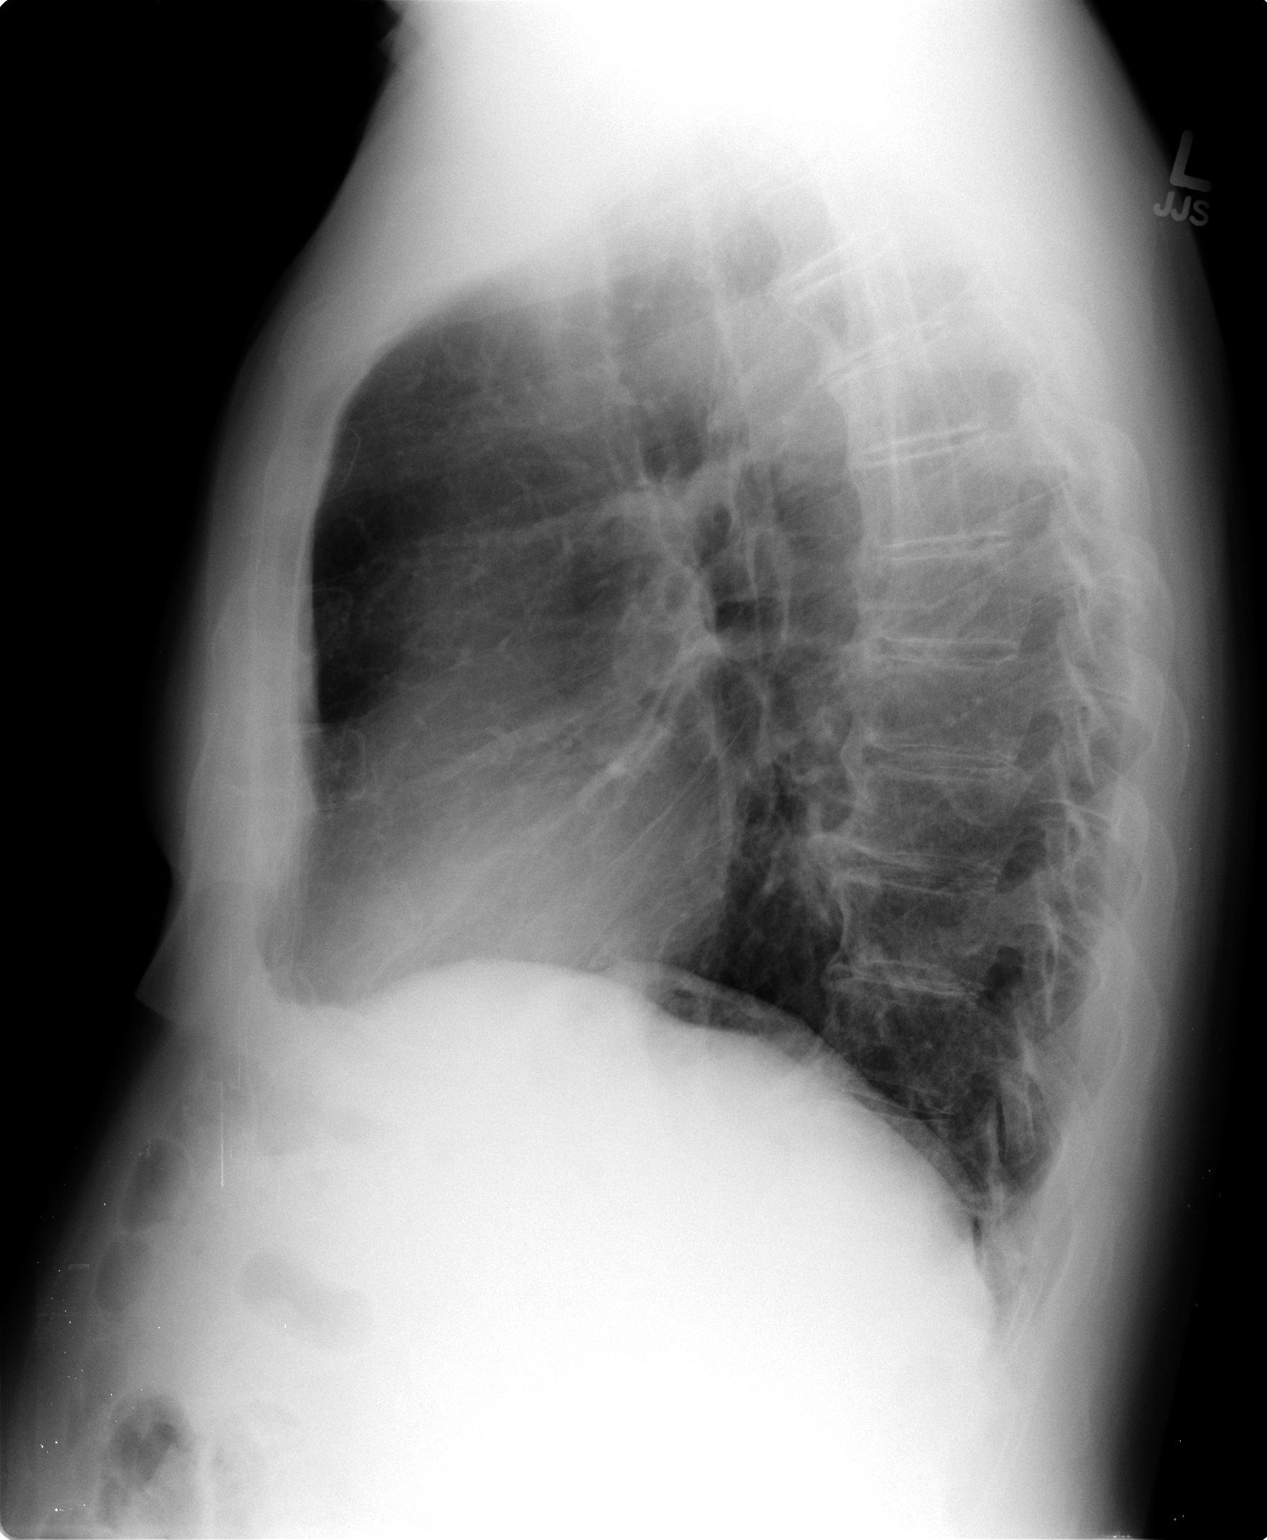

[2 of 2 positions shown; findings below may reference images not displayed]

FINDINGS: There is no focal infiltrate, pulmonary edema, or pleural
effusion.  The mediastinal contour and cardiac silhouette are
normal.  The soft tissue and osseous structures are stable.  There
are degenerative joint changes of thoracic spine.
IMPRESSION: No acute cardiopulmonary disease identified.

## 2013-01-29 ENCOUNTER — Ambulatory Visit (INDEPENDENT_AMBULATORY_CARE_PROVIDER_SITE_OTHER): Payer: Medicare Other | Admitting: *Deleted

## 2013-01-29 DIAGNOSIS — I4891 Unspecified atrial fibrillation: Secondary | ICD-10-CM | POA: Diagnosis not present

## 2013-01-29 DIAGNOSIS — Z7901 Long term (current) use of anticoagulants: Secondary | ICD-10-CM | POA: Diagnosis not present

## 2013-01-29 LAB — POCT INR: INR: 1.9

## 2013-02-16 ENCOUNTER — Ambulatory Visit (INDEPENDENT_AMBULATORY_CARE_PROVIDER_SITE_OTHER): Payer: Medicare Other | Admitting: Pharmacist

## 2013-02-16 DIAGNOSIS — Z7901 Long term (current) use of anticoagulants: Secondary | ICD-10-CM | POA: Diagnosis not present

## 2013-02-16 DIAGNOSIS — I4891 Unspecified atrial fibrillation: Secondary | ICD-10-CM | POA: Diagnosis not present

## 2013-03-01 DIAGNOSIS — H4011X0 Primary open-angle glaucoma, stage unspecified: Secondary | ICD-10-CM | POA: Diagnosis not present

## 2013-03-01 DIAGNOSIS — H409 Unspecified glaucoma: Secondary | ICD-10-CM | POA: Diagnosis not present

## 2013-03-09 ENCOUNTER — Ambulatory Visit (INDEPENDENT_AMBULATORY_CARE_PROVIDER_SITE_OTHER): Payer: Medicare Other | Admitting: *Deleted

## 2013-03-09 DIAGNOSIS — Z7901 Long term (current) use of anticoagulants: Secondary | ICD-10-CM

## 2013-03-09 DIAGNOSIS — I4891 Unspecified atrial fibrillation: Secondary | ICD-10-CM | POA: Diagnosis not present

## 2013-03-09 LAB — POCT INR: INR: 2.5

## 2013-03-29 ENCOUNTER — Encounter: Payer: Self-pay | Admitting: Cardiology

## 2013-03-29 ENCOUNTER — Ambulatory Visit (INDEPENDENT_AMBULATORY_CARE_PROVIDER_SITE_OTHER): Payer: Medicare Other | Admitting: Cardiology

## 2013-03-29 ENCOUNTER — Telehealth: Payer: Self-pay | Admitting: Cardiology

## 2013-03-29 VITALS — BP 128/70 | HR 62 | Ht 71.0 in | Wt 201.2 lb

## 2013-03-29 DIAGNOSIS — R002 Palpitations: Secondary | ICD-10-CM | POA: Diagnosis not present

## 2013-03-29 DIAGNOSIS — I4891 Unspecified atrial fibrillation: Secondary | ICD-10-CM | POA: Diagnosis not present

## 2013-03-29 DIAGNOSIS — R079 Chest pain, unspecified: Secondary | ICD-10-CM | POA: Diagnosis not present

## 2013-03-29 NOTE — Telephone Encounter (Signed)
Pt called back stating very rudely that no one has bothered to call him today and he wants to know what is wrong with this office.  Explained to pt I call this am and documented I call at 10:53 with no answer.  He is complaining that his heart is going in and out of rhythm and that it is not At Fib and most be checked.   Advised pt to come into the office for an EKG.

## 2013-03-29 NOTE — Patient Instructions (Addendum)
The current medical regimen is effective;  continue present plan and medications.  Please have blood work today  (BMP and MG) level today  Please follow up as scheduled 04/09/2013.

## 2013-03-29 NOTE — Progress Notes (Signed)
HPI The patient presents for followup of palpitations. He was added to my schedule today.  He says that he's been having palpitations for about 48 hours. This is not similar to previous fibrillation. He feels a little lightheaded and weak with this but has not been describing presyncope or syncope. He denies any chest pressure, neck or arm discomfort. He's had no shortness of breath, PND or orthopnea.  He has been under more stress recently because of his wife's dental implants. He is walking for exercise.   Allergies  Allergen Reactions  . Sulfa Antibiotics   . Sulfamethoxazole     REACTION: rash    Current Outpatient Prescriptions  Medication Sig Dispense Refill  . cetirizine (ZYRTEC) 10 MG tablet Take 10 mg by mouth daily.        Marland Kitchen latanoprost (XALATAN) 0.005 % ophthalmic solution Place 1 drop into both eyes daily.        Marland Kitchen lisinopril (PRINIVIL,ZESTRIL) 10 MG tablet Take 10 mg by mouth daily.        Marland Kitchen loratadine (CLARITIN) 10 MG tablet Take 10 mg by mouth daily.        . metoprolol tartrate (LOPRESSOR) 25 MG tablet Take 25 mg by mouth 2 (two) times daily.      . mometasone (NASONEX) 50 MCG/ACT nasal spray Place 2 sprays into the nose daily.        . Multiple Vitamins-Minerals (CENTRUM SILVER PO) Take 1 tablet by mouth daily.        Marland Kitchen omeprazole (PRILOSEC) 20 MG capsule Take 20 mg by mouth 2 (two) times daily.       . potassium chloride (KLOR-CON) 10 MEQ CR tablet Take 10 mEq by mouth daily.       . Psyllium (METAMUCIL PO) Take by mouth 2 (two) times daily.        . simvastatin (ZOCOR) 40 MG tablet Take 40 mg by mouth at bedtime.        . tamsulosin (FLOMAX) 0.4 MG CAPS Take 0.4 mg by mouth daily.      Marland Kitchen warfarin (COUMADIN) 5 MG tablet Take 1 tablet (5 mg total) by mouth as directed. Except on Wednesday take 2.5 mg  30 tablet  3   No current facility-administered medications for this visit.    Past Medical History  Diagnosis Date  . Allergic rhinitis   . Hyperlipidemia   . HTN  (hypertension)   . Low back pain   . Paroxysmal atrial fibrillation   . GERD (gastroesophageal reflux disease)   . Glaucoma(365)   . Hx of colonic polyps     tubular adenoma   . Nephrolithiasis   . Hemorrhoids     internal  . Pancolonic diverticulosis   . Cancer     FACIAL MELONOMA  . Cataract     REMOVED    Past Surgical History  Procedure Laterality Date  . Cholecystectomy    . Vasectomy    . Right inguinal hernia repair    . Refractive surgery    . Ruptured blood vessel in the bladder    . Removed 2 stones from left ureter    . Blood vessel repair in bladder    . Colonoscopy  06-29-12    per Dr. Leone Payor, adenomatous polyps, repeat in 3 yrs     ROS:  As stated in the HPI and negative for all other systems.   PHYSICAL EXAM BP 128/70  Pulse 62  Ht 5\' 11"  (1.803 m)  Wt  201 lb 3.2 oz (91.264 kg)  BMI 28.07 kg/m2 GENERAL:  Well appearing HEENT:  Pupils equal round and reactive, fundi not visualized, oral mucosa unremarkable NECK:  No jugular venous distention, waveform within normal limits, carotid upstroke brisk and symmetric, no bruits, no thyromegaly LYMPHATICS:  No cervical, inguinal adenopathy LUNGS:  Clear to auscultation bilaterally BACK:  No CVA tenderness CHEST:  Unremarkable HEART:  PMI not displaced or sustained,S1 and S2 within normal limits, no S3, no S4, no clicks, no rubs, no murmurs ABD:  Flat, positive bowel sounds normal in frequency in pitch, no bruits, no rebound, no guarding, no midline pulsatile mass, no hepatomegaly, no splenomegaly EXT:  2 plus pulses throughout, no edema, no cyanosis no clubbing  EKG:  Sinus rhythm, rate 62, axis within normal limits, intervals within normal limits, no acute ST-T wave changes, PACs  03/29/2013  ASSESSMENT AND PLAN  PACs - I will check a basic metabolic profile and magnesium. We discussed this diagnosis. This is likely what he was feeling today. We are going to manage this conservatively. For now he will  continue the meds as listed.  ATRIAL FIBRILLATION -  I don't suspect sustained symptomatic recurrences of this. We have titrated down his beta blocker in the past because of bradycardia. He will continue on the meds as listed. He tolerates anticoagulation.  HYPERTENSION -  I reviewed his BP diary. He will continue with the meds as listed as his BP is at target.

## 2013-03-29 NOTE — Telephone Encounter (Signed)
New Prob     Pt c/o irregular heart beat but states it is not afib. Pt would like to speak to nurse about possibly being seen today.

## 2013-03-29 NOTE — Telephone Encounter (Signed)
No answer [id number]

## 2013-03-30 LAB — MAGNESIUM: Magnesium: 2.1 mg/dL (ref 1.5–2.5)

## 2013-03-30 LAB — BASIC METABOLIC PANEL
Calcium: 9.3 mg/dL (ref 8.4–10.5)
GFR: 61.06 mL/min (ref >60.00)
Potassium: 4.7 mEq/L (ref 3.5–5.1)
Sodium: 141 mEq/L (ref 135–145)

## 2013-04-03 DIAGNOSIS — Z85820 Personal history of malignant melanoma of skin: Secondary | ICD-10-CM | POA: Diagnosis not present

## 2013-04-03 DIAGNOSIS — L570 Actinic keratosis: Secondary | ICD-10-CM | POA: Diagnosis not present

## 2013-04-03 DIAGNOSIS — L905 Scar conditions and fibrosis of skin: Secondary | ICD-10-CM | POA: Diagnosis not present

## 2013-04-09 ENCOUNTER — Encounter: Payer: Self-pay | Admitting: Cardiology

## 2013-04-09 ENCOUNTER — Ambulatory Visit (INDEPENDENT_AMBULATORY_CARE_PROVIDER_SITE_OTHER): Payer: Medicare Other | Admitting: Cardiology

## 2013-04-09 ENCOUNTER — Ambulatory Visit (INDEPENDENT_AMBULATORY_CARE_PROVIDER_SITE_OTHER): Payer: Medicare Other | Admitting: Pharmacist

## 2013-04-09 VITALS — BP 115/65 | HR 72 | Ht 70.0 in | Wt 202.1 lb

## 2013-04-09 DIAGNOSIS — I4891 Unspecified atrial fibrillation: Secondary | ICD-10-CM | POA: Diagnosis not present

## 2013-04-09 DIAGNOSIS — R002 Palpitations: Secondary | ICD-10-CM

## 2013-04-09 DIAGNOSIS — Z7901 Long term (current) use of anticoagulants: Secondary | ICD-10-CM | POA: Diagnosis not present

## 2013-04-09 DIAGNOSIS — I10 Essential (primary) hypertension: Secondary | ICD-10-CM

## 2013-04-09 LAB — POCT INR: INR: 1.9

## 2013-04-09 NOTE — Patient Instructions (Addendum)
The current medical regimen is effective;  continue present plan and medications.  Follow up in 6 months with Dr Hochrein.  You will receive a letter in the mail 2 months before you are due.  Please call us when you receive this letter to schedule your follow up appointment.  

## 2013-04-09 NOTE — Progress Notes (Signed)
HPI The patient presents for followup of palpitations. I saw him urgently last week for this.  I suspected PACs.  This has since resolved.  He is slightly lightheaded still but this is very mild.  The patient denies any new symptoms such as chest discomfort, neck or arm discomfort. There has been no new shortness of breath, PND or orthopnea. There have been no reported palpitations, presyncope or syncope.  Allergies  Allergen Reactions  . Sulfa Antibiotics   . Sulfamethoxazole     REACTION: rash    Current Outpatient Prescriptions  Medication Sig Dispense Refill  . cetirizine (ZYRTEC) 10 MG tablet Take 10 mg by mouth daily.        Marland Kitchen latanoprost (XALATAN) 0.005 % ophthalmic solution Place 1 drop into both eyes daily.        Marland Kitchen lisinopril (PRINIVIL,ZESTRIL) 10 MG tablet Take 10 mg by mouth daily.        Marland Kitchen loratadine (CLARITIN) 10 MG tablet Take 10 mg by mouth daily.        . metoprolol tartrate (LOPRESSOR) 25 MG tablet Take 25 mg by mouth 2 (two) times daily.      . mometasone (NASONEX) 50 MCG/ACT nasal spray Place 2 sprays into the nose daily.        . Multiple Vitamins-Minerals (CENTRUM SILVER PO) Take 1 tablet by mouth daily.        Marland Kitchen omeprazole (PRILOSEC) 20 MG capsule Take 20 mg by mouth 2 (two) times daily.       . potassium chloride (KLOR-CON) 10 MEQ CR tablet Take 10 mEq by mouth daily.       . Psyllium (METAMUCIL PO) Take by mouth 2 (two) times daily.        . simvastatin (ZOCOR) 40 MG tablet Take 40 mg by mouth at bedtime.        . tamsulosin (FLOMAX) 0.4 MG CAPS Take 0.4 mg by mouth daily.      Marland Kitchen warfarin (COUMADIN) 5 MG tablet Take 1 tablet (5 mg total) by mouth as directed. Except on Wednesday take 2.5 mg  30 tablet  3   No current facility-administered medications for this visit.    Past Medical History  Diagnosis Date  . Allergic rhinitis   . Hyperlipidemia   . HTN (hypertension)   . Low back pain   . Paroxysmal atrial fibrillation   . GERD (gastroesophageal reflux  disease)   . Glaucoma   . Hx of colonic polyps     tubular adenoma   . Nephrolithiasis   . Hemorrhoids     internal  . Pancolonic diverticulosis   . Cancer     FACIAL MELONOMA  . Cataract     REMOVED    Past Surgical History  Procedure Laterality Date  . Cholecystectomy    . Vasectomy    . Right inguinal hernia repair    . Refractive surgery    . Ruptured blood vessel in the bladder    . Removed 2 stones from left ureter    . Blood vessel repair in bladder    . Colonoscopy  06-29-12    per Dr. Leone Payor, adenomatous polyps, repeat in 3 yrs     ROS:  As stated in the HPI and negative for all other systems.   PHYSICAL EXAM BP 115/65  Pulse 72  Ht 5\' 10"  (1.778 m)  Wt 202 lb 1.9 oz (91.681 kg)  BMI 29 kg/m2 GENERAL:  Well appearing NECK:  No jugular  venous distention, waveform within normal limits, carotid upstroke brisk and symmetric, no bruits, no thyromegaly LUNGS:  Clear to auscultation bilaterally CHEST:  Unremarkable HEART:  PMI not displaced or sustained,S1 and S2 within normal limits, no S3, no S4, no clicks, no rubs, no murmurs ABD:  Flat, positive bowel sounds normal in frequency in pitch, no bruits, no rebound, no guarding, no midline pulsatile mass, no hepatomegaly, no splenomegaly EXT:  2 plus pulses throughout, no edema, no cyanosis no clubbing  ASSESSMENT AND PLAN  PACs - This has not resolved. His labs were within normal limits. No change in therapy is indicated.  ATRIAL FIBRILLATION -  He has had no symptoms to suggest recurrence of this.  HYPERTENSION -  I reviewed his BP diary. He will continue with the meds as listed as his BP is at target.

## 2013-04-16 ENCOUNTER — Other Ambulatory Visit: Payer: Self-pay | Admitting: *Deleted

## 2013-04-16 MED ORDER — WARFARIN SODIUM 5 MG PO TABS: ORAL_TABLET | ORAL | Status: DC

## 2013-05-07 ENCOUNTER — Ambulatory Visit (INDEPENDENT_AMBULATORY_CARE_PROVIDER_SITE_OTHER): Payer: Medicare Other | Admitting: *Deleted

## 2013-05-07 DIAGNOSIS — Z7901 Long term (current) use of anticoagulants: Secondary | ICD-10-CM

## 2013-05-07 DIAGNOSIS — I4891 Unspecified atrial fibrillation: Secondary | ICD-10-CM

## 2013-05-07 LAB — POCT INR: INR: 2.2

## 2013-05-17 DIAGNOSIS — N401 Enlarged prostate with lower urinary tract symptoms: Secondary | ICD-10-CM | POA: Diagnosis not present

## 2013-05-17 DIAGNOSIS — R3129 Other microscopic hematuria: Secondary | ICD-10-CM | POA: Diagnosis not present

## 2013-05-24 DIAGNOSIS — H409 Unspecified glaucoma: Secondary | ICD-10-CM | POA: Diagnosis not present

## 2013-05-24 DIAGNOSIS — H4011X0 Primary open-angle glaucoma, stage unspecified: Secondary | ICD-10-CM | POA: Diagnosis not present

## 2013-05-24 DIAGNOSIS — Z961 Presence of intraocular lens: Secondary | ICD-10-CM | POA: Diagnosis not present

## 2013-06-04 ENCOUNTER — Ambulatory Visit (INDEPENDENT_AMBULATORY_CARE_PROVIDER_SITE_OTHER): Payer: Medicare Other | Admitting: *Deleted

## 2013-06-04 DIAGNOSIS — Z7901 Long term (current) use of anticoagulants: Secondary | ICD-10-CM

## 2013-06-04 DIAGNOSIS — I4891 Unspecified atrial fibrillation: Secondary | ICD-10-CM | POA: Diagnosis not present

## 2013-06-04 LAB — POCT INR: INR: 2.1

## 2013-06-07 ENCOUNTER — Telehealth: Payer: Self-pay | Admitting: Family Medicine

## 2013-06-07 MED ORDER — CIPROFLOXACIN HCL 500 MG PO TABS: 500.0000 mg | ORAL_TABLET | Freq: Two times a day (BID) | ORAL | Status: DC

## 2013-06-07 NOTE — Telephone Encounter (Signed)
He is here with his wife. He has had LLQ pains for 2 days and it feels like diverticulitis again. No nausea or fever. BMs normal. Given 10 days of Cipro. Recheck prn

## 2013-07-02 ENCOUNTER — Ambulatory Visit (INDEPENDENT_AMBULATORY_CARE_PROVIDER_SITE_OTHER): Payer: Medicare Other | Admitting: *Deleted

## 2013-07-02 DIAGNOSIS — Z7901 Long term (current) use of anticoagulants: Secondary | ICD-10-CM | POA: Diagnosis not present

## 2013-07-02 DIAGNOSIS — I4891 Unspecified atrial fibrillation: Secondary | ICD-10-CM

## 2013-07-04 DIAGNOSIS — D235 Other benign neoplasm of skin of trunk: Secondary | ICD-10-CM | POA: Diagnosis not present

## 2013-07-04 DIAGNOSIS — Z85820 Personal history of malignant melanoma of skin: Secondary | ICD-10-CM | POA: Diagnosis not present

## 2013-07-23 ENCOUNTER — Ambulatory Visit (INDEPENDENT_AMBULATORY_CARE_PROVIDER_SITE_OTHER): Payer: Medicare Other

## 2013-07-23 DIAGNOSIS — I4891 Unspecified atrial fibrillation: Secondary | ICD-10-CM | POA: Diagnosis not present

## 2013-07-23 DIAGNOSIS — Z7901 Long term (current) use of anticoagulants: Secondary | ICD-10-CM | POA: Diagnosis not present

## 2013-08-06 ENCOUNTER — Ambulatory Visit (INDEPENDENT_AMBULATORY_CARE_PROVIDER_SITE_OTHER): Payer: Medicare Other | Admitting: Pharmacist

## 2013-08-06 DIAGNOSIS — I4891 Unspecified atrial fibrillation: Secondary | ICD-10-CM | POA: Diagnosis not present

## 2013-08-06 DIAGNOSIS — Z7901 Long term (current) use of anticoagulants: Secondary | ICD-10-CM | POA: Diagnosis not present

## 2013-08-06 LAB — POCT INR: INR: 2.7

## 2013-08-07 ENCOUNTER — Ambulatory Visit (INDEPENDENT_AMBULATORY_CARE_PROVIDER_SITE_OTHER): Payer: Medicare Other | Admitting: Cardiology

## 2013-08-07 ENCOUNTER — Encounter: Payer: Self-pay | Admitting: Cardiology

## 2013-08-07 ENCOUNTER — Telehealth: Payer: Self-pay | Admitting: Cardiology

## 2013-08-07 VITALS — BP 90/60 | HR 127 | Ht 70.0 in | Wt 202.0 lb

## 2013-08-07 DIAGNOSIS — I4891 Unspecified atrial fibrillation: Secondary | ICD-10-CM

## 2013-08-07 NOTE — Telephone Encounter (Signed)
Follow up  Pt states he got home and his heart was back in rhythm if it goes back out he will head to the ER.

## 2013-08-07 NOTE — Telephone Encounter (Signed)
Pt complaining of recent At Fib and now having an "irregular heartbeat"  appt given for today at 3 pm

## 2013-08-07 NOTE — Progress Notes (Signed)
HPI The patient was added on today for evaluation of palpitations. He knew he was back in atrial fibrillation and has felt this for least 30 hours. He did take some extra beta blocker. He took his usual twice a day metoprolol yesterday but also took 50 mg of his wife's atenolol in the morning and 50 mg of metoprolol in the afternoon. However, the heart rate has remained elevated. He's been a little lightheaded but otherwise has not had any presyncope or syncope. He's not had any chest pressure, neck or arm discomfort. He's had no new shortness of breath, PND or orthopnea. Of note he did have a negative stress perfusion study with an EF of 59% last March. He's had an echocardiogram last Marchas well with no significant valvular disease and a well preserved ejection fraction. He has otherwise felt well since that time.  Allergies  Allergen Reactions  . Sulfa Antibiotics   . Sulfamethoxazole     REACTION: rash    Current Outpatient Prescriptions  Medication Sig Dispense Refill  . cetirizine (ZYRTEC) 10 MG tablet Take 10 mg by mouth daily.        Marland Kitchen latanoprost (XALATAN) 0.005 % ophthalmic solution Place 1 drop into both eyes daily.        Marland Kitchen lisinopril (PRINIVIL,ZESTRIL) 10 MG tablet Take 10 mg by mouth daily.        Marland Kitchen loratadine (CLARITIN) 10 MG tablet Take 10 mg by mouth daily.        . metoprolol tartrate (LOPRESSOR) 25 MG tablet Take 25 mg by mouth 2 (two) times daily.      . mometasone (NASONEX) 50 MCG/ACT nasal spray Place 2 sprays into the nose daily.        . Multiple Vitamins-Minerals (CENTRUM SILVER PO) Take 1 tablet by mouth daily.        Marland Kitchen omeprazole (PRILOSEC) 20 MG capsule Take 20 mg by mouth 2 (two) times daily.       . potassium chloride (KLOR-CON) 10 MEQ CR tablet Take 10 mEq by mouth daily.       . Psyllium (METAMUCIL PO) Take by mouth 2 (two) times daily.        . simvastatin (ZOCOR) 40 MG tablet Take 40 mg by mouth at bedtime.        . tamsulosin (FLOMAX) 0.4 MG CAPS Take  0.4 mg by mouth daily.      Marland Kitchen warfarin (COUMADIN) 5 MG tablet Take as directed by coumadin clinic  30 tablet  3   No current facility-administered medications for this visit.    Past Medical History  Diagnosis Date  . Allergic rhinitis   . Hyperlipidemia   . HTN (hypertension)   . Low back pain   . Paroxysmal atrial fibrillation   . GERD (gastroesophageal reflux disease)   . Glaucoma   . Hx of colonic polyps     tubular adenoma   . Nephrolithiasis   . Hemorrhoids     internal  . Pancolonic diverticulosis   . Cancer     FACIAL MELONOMA  . Cataract     REMOVED    Past Surgical History  Procedure Laterality Date  . Cholecystectomy    . Vasectomy    . Right inguinal hernia repair    . Refractive surgery    . Ruptured blood vessel in the bladder    . Removed 2 stones from left ureter    . Blood vessel repair in bladder    . Colonoscopy  06-29-12    per Dr. Leone Payor, adenomatous polyps, repeat in 3 yrs     Family History  Problem Relation Age of Onset  . Heart attack Father 47  . Heart disease Father   . Colon cancer Neg Hx   . Ovarian cancer Mother     History   Social History  . Marital Status: Married    Spouse Name: N/A    Number of Children: 2  . Years of Education: N/A   Occupational History  . Retired    Social History Main Topics  . Smoking status: Never Smoker   . Smokeless tobacco: Never Used  . Alcohol Use: No  . Drug Use: No  . Sexual Activity: Not on file   Other Topics Concern  . Not on file   Social History Narrative  . No narrative on file    ROS:  PHYSICAL EXAM BP 90/60  Pulse 127  Ht 5\' 10"  (1.778 m)  Wt 202 lb (91.627 kg)  BMI 28.98 kg/m2 GENERAL:  Well appearing HEENT:  Pupils equal round and reactive, fundi not visualized, oral mucosa unremarkable NECK:  No jugular venous distention, waveform within normal limits, carotid upstroke brisk and symmetric, no bruits, no thyromegaly LYMPHATICS:  No cervical, inguinal  adenopathy LUNGS:  Clear to auscultation bilaterally BACK:  No CVA tenderness CHEST:  Unremarkable HEART:  PMI not displaced or sustained,S1 and S2 within normal limits, no S3, no clicks, no rubs, no murmurs, irregular ABD:  Flat, positive bowel sounds normal in frequency in pitch, no bruits, no rebound, no guarding, no midline pulsatile mass, no hepatomegaly, no splenomegaly EXT:  2 plus pulses throughout, no edema, no cyanosis no clubbing SKIN:  No rashes no nodules NEURO:  Cranial nerves II through XII grossly intact, motor grossly intact throughout [facility]:  Cognitively intact, oriented to person place and time   EKG:  Atrial fibrillation, rates 127, axis within normal limits, intervals within normal limits, poor anterior R wave progression  08/07/2013   ASSESSMENT AND PLAN  ATRIAL FIBRILLATION -  Since this has been persistent I will send the patient to the emergency room for a flecainide bolus. He has had otherwise a normal heart with no suggestion of coronary disease. If this works he could do this "pill in pocket" approach at home. If he does not convert I would admit him for rate control and I will consider management with Tikosyn.  He will need a basic metabolic profile and magnesium on presentation.  With his weight greater than 70 kg his dose of flecainide would be 300 mg.  HYPERTENSION -  The blood pressure is at target. No change in medications is indicated. We will continue with therapeutic lifestyle changes (TLC).

## 2013-08-07 NOTE — Telephone Encounter (Signed)
New problem  Pt states he was in Afib with previous OV appt// This morning he believes afib is gone but he has an irregular heartbeat and breathing is harder than normal. Please advise.

## 2013-08-07 NOTE — Patient Instructions (Addendum)
Please report to the ED for flecainide for At Fib.   Keep appointment as scheduled.[name] [name]

## 2013-08-09 NOTE — Telephone Encounter (Signed)
Great. Noted. 

## 2013-08-10 ENCOUNTER — Other Ambulatory Visit: Payer: Self-pay | Admitting: *Deleted

## 2013-08-10 MED ORDER — WARFARIN SODIUM 5 MG PO TABS: ORAL_TABLET | ORAL | Status: DC

## 2013-08-22 ENCOUNTER — Ambulatory Visit (INDEPENDENT_AMBULATORY_CARE_PROVIDER_SITE_OTHER): Payer: Medicare Other | Admitting: Family Medicine

## 2013-08-22 ENCOUNTER — Encounter: Payer: Self-pay | Admitting: Family Medicine

## 2013-08-22 VITALS — BP 124/74 | HR 66 | Temp 98.4°F | Ht 70.0 in | Wt 201.0 lb

## 2013-08-22 DIAGNOSIS — Z23 Encounter for immunization: Secondary | ICD-10-CM | POA: Diagnosis not present

## 2013-08-22 DIAGNOSIS — N139 Obstructive and reflux uropathy, unspecified: Secondary | ICD-10-CM | POA: Diagnosis not present

## 2013-08-22 DIAGNOSIS — E785 Hyperlipidemia, unspecified: Secondary | ICD-10-CM

## 2013-08-22 DIAGNOSIS — I495 Sick sinus syndrome: Secondary | ICD-10-CM

## 2013-08-22 DIAGNOSIS — N401 Enlarged prostate with lower urinary tract symptoms: Secondary | ICD-10-CM | POA: Diagnosis not present

## 2013-08-22 DIAGNOSIS — I10 Essential (primary) hypertension: Secondary | ICD-10-CM | POA: Diagnosis not present

## 2013-08-22 DIAGNOSIS — N138 Other obstructive and reflux uropathy: Secondary | ICD-10-CM

## 2013-08-22 DIAGNOSIS — I4891 Unspecified atrial fibrillation: Secondary | ICD-10-CM

## 2013-08-22 LAB — POCT URINALYSIS DIPSTICK
Bilirubin, UA: NEGATIVE
Glucose, UA: NEGATIVE
Nitrite, UA: NEGATIVE
Spec Grav, UA: 1.02
Urobilinogen, UA: 0.2

## 2013-08-22 LAB — CBC WITH DIFFERENTIAL/PLATELET
Basophils Absolute: 0 10*3/uL (ref 0.0–0.1)
Hemoglobin: 13.5 g/dL (ref 13.0–17.0)
Lymphocytes Relative: 23.2 % (ref 12.0–46.0)
Monocytes Relative: 8.7 % (ref 3.0–12.0)
Platelets: 184 10*3/uL (ref 150.0–400.0)
RDW: 14.2 % (ref 11.5–14.6)
WBC: 7.8 10*3/uL (ref 4.5–10.5)

## 2013-08-22 LAB — BASIC METABOLIC PANEL
CO2: 31 mEq/L (ref 19–32)
Calcium: 9.3 mg/dL (ref 8.4–10.5)
Creatinine, Ser: 1.2 mg/dL (ref 0.4–1.5)
GFR: 64.68 mL/min (ref >60.00)
Sodium: 139 mEq/L (ref 135–145)

## 2013-08-22 LAB — LIPID PANEL
HDL: 41.4 mg/dL (ref >39.00)
LDL Cholesterol: 85 mg/dL (ref 0–99)
Total CHOL/HDL Ratio: 3
Triglycerides: 86 mg/dL (ref 0.0–149.0)
VLDL: 17.2 mg/dL (ref 0.0–40.0)

## 2013-08-22 LAB — HEPATIC FUNCTION PANEL
Bilirubin, Direct: 0.1 mg/dL (ref 0.0–0.3)
Total Protein: 6.8 g/dL (ref 6.0–8.3)

## 2013-08-22 LAB — TSH: TSH: 1.35 u[IU]/mL (ref 0.35–5.50)

## 2013-08-22 LAB — PSA: PSA: 1.13 ng/mL (ref 0.10–4.00)

## 2013-08-22 NOTE — Progress Notes (Signed)
  Subjective:    Patient ID: [name] [name], male    DOB: 1929-05-22, 79 y.o.   MRN: [id number]  HPI 79 yr old male for a cpx. He feels well.    Review of Systems  Constitutional: Negative.   HENT: Negative.   Eyes: Negative.   Respiratory: Negative.   Cardiovascular: Negative.   Gastrointestinal: Negative.   Genitourinary: Negative.   Musculoskeletal: Negative.   Skin: Negative.   Neurological: Negative.   Psychiatric/Behavioral: Negative.        Objective:   Physical Exam  Constitutional: He is oriented to person, place, and time. He appears well-developed and well-nourished. No distress.  HENT:  Head: Normocephalic and atraumatic.  Right Ear: External ear normal.  Left Ear: External ear normal.  Nose: Nose normal.  Mouth/Throat: Oropharynx is clear and moist. No oropharyngeal exudate.  Eyes: Conjunctivae and EOM are normal. Pupils are equal, round, and reactive to light. Right eye exhibits no discharge. Left eye exhibits no discharge. No scleral icterus.  Neck: Neck supple. No JVD present. No tracheal deviation present. No thyromegaly present.  Cardiovascular: Normal rate, regular rhythm, normal heart sounds and intact distal pulses.  Exam reveals no gallop and no friction rub.   No murmur heard. Pulmonary/Chest: Effort normal and breath sounds normal. No respiratory distress. He has no wheezes. He has no rales. He exhibits no tenderness.  Abdominal: Soft. Bowel sounds are normal. He exhibits no distension and no mass. There is no tenderness. There is no rebound and no guarding.  Genitourinary: Rectum normal, prostate normal and penis normal. Guaiac negative stool. No penile tenderness.  Musculoskeletal: Normal range of motion. He exhibits no edema and no tenderness.  Lymphadenopathy:    He has no cervical adenopathy.  Neurological: He is alert and oriented to person, place, and time. He has normal reflexes. No cranial nerve deficit. He exhibits normal muscle tone.  Coordination normal.  Skin: Skin is warm and dry. No rash noted. He is not diaphoretic. No erythema. No pallor.  Psychiatric: He has a normal mood and affect. His behavior is normal. Judgment and thought content normal.          Assessment & Plan:  Well exam. Get fasting labs

## 2013-08-27 ENCOUNTER — Ambulatory Visit (INDEPENDENT_AMBULATORY_CARE_PROVIDER_SITE_OTHER): Payer: Medicare Other | Admitting: General Practice

## 2013-08-27 DIAGNOSIS — I4891 Unspecified atrial fibrillation: Secondary | ICD-10-CM

## 2013-08-27 DIAGNOSIS — H4011X0 Primary open-angle glaucoma, stage unspecified: Secondary | ICD-10-CM | POA: Diagnosis not present

## 2013-08-27 DIAGNOSIS — Z7901 Long term (current) use of anticoagulants: Secondary | ICD-10-CM | POA: Diagnosis not present

## 2013-08-27 DIAGNOSIS — H409 Unspecified glaucoma: Secondary | ICD-10-CM | POA: Diagnosis not present

## 2013-08-28 NOTE — Progress Notes (Signed)
Quick Note:  I released results in my chart. ______ 

## 2013-09-20 DIAGNOSIS — N281 Cyst of kidney, acquired: Secondary | ICD-10-CM | POA: Diagnosis not present

## 2013-09-20 DIAGNOSIS — R3129 Other microscopic hematuria: Secondary | ICD-10-CM | POA: Diagnosis not present

## 2013-09-20 DIAGNOSIS — N200 Calculus of kidney: Secondary | ICD-10-CM | POA: Diagnosis not present

## 2013-09-24 ENCOUNTER — Ambulatory Visit (INDEPENDENT_AMBULATORY_CARE_PROVIDER_SITE_OTHER): Payer: Medicare Other | Admitting: Pharmacist

## 2013-09-24 DIAGNOSIS — Z7901 Long term (current) use of anticoagulants: Secondary | ICD-10-CM

## 2013-09-24 DIAGNOSIS — I4891 Unspecified atrial fibrillation: Secondary | ICD-10-CM

## 2013-09-24 LAB — POCT INR: INR: 2.1

## 2013-10-11 ENCOUNTER — Encounter: Payer: Self-pay | Admitting: Cardiology

## 2013-10-11 ENCOUNTER — Ambulatory Visit (INDEPENDENT_AMBULATORY_CARE_PROVIDER_SITE_OTHER): Payer: Medicare Other | Admitting: Cardiology

## 2013-10-11 VITALS — BP 124/69 | HR 52 | Ht 71.0 in | Wt 200.8 lb

## 2013-10-11 DIAGNOSIS — I10 Essential (primary) hypertension: Secondary | ICD-10-CM | POA: Diagnosis not present

## 2013-10-11 DIAGNOSIS — I4891 Unspecified atrial fibrillation: Secondary | ICD-10-CM

## 2013-10-11 NOTE — Progress Notes (Signed)
HPI The patient presents for follow up of atrial fibrillation. At the last visit he was in atrial fib and the plan was to send him to the emergency room for treatment with flecainide. However he converted to sinus rhythm before he could get there. Since then he has had no further episodes. He is walking daily. The patient denies any new symptoms such as chest discomfort, neck or arm discomfort. There has been no new shortness of breath, PND or orthopnea. There have been no reported palpitations, presyncope or syncope.  Allergies  Allergen Reactions  . Sulfa Antibiotics   . Sulfamethoxazole     REACTION: rash    Current Outpatient Prescriptions  Medication Sig Dispense Refill  . cetirizine (ZYRTEC) 10 MG tablet Take 10 mg by mouth daily.        Marland Kitchen latanoprost (XALATAN) 0.005 % ophthalmic solution Place 1 drop into both eyes daily.        Marland Kitchen lisinopril (PRINIVIL,ZESTRIL) 10 MG tablet Take 10 mg by mouth daily.        Marland Kitchen loratadine (CLARITIN) 10 MG tablet Take 10 mg by mouth daily.        . metoprolol tartrate (LOPRESSOR) 25 MG tablet Take 25 mg by mouth 2 (two) times daily.      . mometasone (NASONEX) 50 MCG/ACT nasal spray Place 2 sprays into the nose daily.        . Multiple Vitamins-Minerals (CENTRUM SILVER PO) Take 1 tablet by mouth daily.        Marland Kitchen omeprazole (PRILOSEC) 20 MG capsule Take 20 mg by mouth 2 (two) times daily.       . potassium chloride (KLOR-CON) 10 MEQ CR tablet Take 10 mEq by mouth daily.       . Psyllium (METAMUCIL PO) Take by mouth 2 (two) times daily.        . simvastatin (ZOCOR) 40 MG tablet Take 40 mg by mouth at bedtime.        . tamsulosin (FLOMAX) 0.4 MG CAPS Take 0.4 mg by mouth daily.      Marland Kitchen warfarin (COUMADIN) 5 MG tablet Take as directed by coumadin clinic  40 tablet  3   No current facility-administered medications for this visit.    Past Medical History  Diagnosis Date  . Allergic rhinitis   . Hyperlipidemia   . HTN (hypertension)   . Low back pain    . Paroxysmal atrial fibrillation     sees Dr. Antoine Poche   . GERD (gastroesophageal reflux disease)   . Glaucoma   . Hx of colonic polyps     tubular adenoma   . Nephrolithiasis   . Hemorrhoids     internal  . Pancolonic diverticulosis   . Cancer     FACIAL MELONOMA  . Cataract     REMOVED    Past Surgical History  Procedure Laterality Date  . Cholecystectomy    . Vasectomy    . Right inguinal hernia repair    . Refractive surgery    . Ruptured blood vessel in the bladder    . Removed 2 stones from left ureter    . Blood vessel repair in bladder    . Colonoscopy  06-29-12    per Dr. Leone Payor, adenomatous polyps, repeat in 3 yrs     ROS:  As stated in the HPI and negative for all other systems.   PHYSICAL EXAM BP 124/69  Pulse 52  Ht 5\' 11"  (1.803 m)  Wt 200  lb 12.8 oz (91.082 kg)  BMI 28.02 kg/m2 GENERAL:  Well appearing NECK:  No jugular venous distention, waveform within normal limits, carotid upstroke brisk and symmetric, no bruits, no thyromegaly LUNGS:  Clear to auscultation bilaterally BACK:  No CVA tenderness CHEST:  Unremarkable HEART:  PMI not displaced or sustained,S1 and S2 within normal limits, no S3, no clicks, no rubs, no murmurs, irregular ABD:  Flat, positive bowel sounds normal in frequency in pitch, no bruits, no rebound, no guarding, no midline pulsatile mass, no hepatomegaly, no splenomegaly EXT:  2 plus pulses throughout, no edema, no cyanosis no clubbing I  ASSESSMENT AND PLAN  ATRIAL FIBRILLATION -  The patient is currently in NSR. In the future if he has recurrent episodes he will go to the emergency room for a "pill in pocket" approach. If he does not convert I would admit him for rate control and I will consider management with Tikosyn. With his weight greater than 70 kg his dose of flecainide would be 300 mg.  HYPERTENSION -  The blood pressure is at target. No change in medications is indicated. We will continue with therapeutic  lifestyle changes (TLC).

## 2013-10-11 NOTE — Patient Instructions (Signed)
The current medical regimen is effective;  continue present plan and medications.  Follow up in 6 months with Dr Hochrein.  You will receive a letter in the mail 2 months before you are due.  Please call us when you receive this letter to schedule your follow up appointment.  

## 2013-11-05 ENCOUNTER — Ambulatory Visit (INDEPENDENT_AMBULATORY_CARE_PROVIDER_SITE_OTHER): Payer: Medicare Other | Admitting: Pharmacist

## 2013-11-05 DIAGNOSIS — Z7901 Long term (current) use of anticoagulants: Secondary | ICD-10-CM | POA: Diagnosis not present

## 2013-11-05 DIAGNOSIS — I4891 Unspecified atrial fibrillation: Secondary | ICD-10-CM

## 2013-11-05 DIAGNOSIS — Z5181 Encounter for therapeutic drug level monitoring: Secondary | ICD-10-CM | POA: Diagnosis not present

## 2013-11-06 ENCOUNTER — Ambulatory Visit (INDEPENDENT_AMBULATORY_CARE_PROVIDER_SITE_OTHER): Payer: Medicare Other | Admitting: Internal Medicine

## 2013-11-06 ENCOUNTER — Encounter: Payer: Self-pay | Admitting: Internal Medicine

## 2013-11-06 ENCOUNTER — Telehealth: Payer: Self-pay | Admitting: Family Medicine

## 2013-11-06 VITALS — BP 140/70 | HR 72 | Temp 98.5°F | Resp 20 | Wt 205.0 lb

## 2013-11-06 DIAGNOSIS — J309 Allergic rhinitis, unspecified: Secondary | ICD-10-CM

## 2013-11-06 DIAGNOSIS — I10 Essential (primary) hypertension: Secondary | ICD-10-CM

## 2013-11-06 DIAGNOSIS — J069 Acute upper respiratory infection, unspecified: Secondary | ICD-10-CM

## 2013-11-06 MED ORDER — HYDROCODONE-HOMATROPINE 5-1.5 MG/5ML PO SYRP: 5.0000 mL | ORAL_SOLUTION | Freq: Four times a day (QID) | ORAL | Status: AC | PRN

## 2013-11-06 NOTE — Progress Notes (Signed)
Pre-visit discussion using our clinic review tool. No additional management support is needed unless otherwise documented below in the visit note.  

## 2013-11-06 NOTE — Telephone Encounter (Signed)
Pt's wife would like you to call her. Pt has poss bronchitis. Refused to talk to triage, and insisted to talk w/ you

## 2013-11-06 NOTE — Telephone Encounter (Signed)
I spoke with pt's husband and pt has an appointment with another provider here this morning.

## 2013-11-06 NOTE — Patient Instructions (Signed)
Acute bronchitis symptoms for less than 10 days are generally not helped by antibiotics.  Take over-the-counter expectorants and cough medications such as  Mucinex DM.  Call if there is no improvement in 5 to 7 days or if he developed worsening cough, fever, or new symptoms, such as shortness of breath or chest pain.    

## 2013-11-06 NOTE — Progress Notes (Signed)
Subjective:    Patient ID: [name] [name], male    DOB: [birth year], 79 y.o.   MRN: [id number]  HPI  79 year old patient who has a history of hypertension and atrial for ablation. For the past 3 days she has had cough and chest congestion. His son and wife has also been ill. He has been using Robitussin with some benefit. There's been some intermittent low-grade fever. No shortness of breath wheezing or significant sputum production. He  Past Medical History  Diagnosis Date  . Allergic rhinitis   . Hyperlipidemia   . HTN (hypertension)   . Low back pain   . Paroxysmal atrial fibrillation     sees Dr. Antoine Poche   . GERD (gastroesophageal reflux disease)   . Glaucoma   . Hx of colonic polyps     tubular adenoma   . Nephrolithiasis   . Hemorrhoids     internal  . Pancolonic diverticulosis   . Cancer     FACIAL MELONOMA  . Cataract     REMOVED    History   Social History  . Marital Status: Married    Spouse Name: N/A    Number of Children: 2  . Years of Education: N/A   Occupational History  . Retired    Social History Main Topics  . Smoking status: Never Smoker   . Smokeless tobacco: Never Used  . Alcohol Use: No  . Drug Use: No  . Sexual Activity: Not on file   Other Topics Concern  . Not on file   Social History Narrative  . No narrative on file    Past Surgical History  Procedure Laterality Date  . Cholecystectomy    . Vasectomy    . Right inguinal hernia repair    . Refractive surgery    . Ruptured blood vessel in the bladder    . Removed 2 stones from left ureter    . Blood vessel repair in bladder    . Colonoscopy  06-29-12    per Dr. Leone Payor, adenomatous polyps, repeat in 3 yrs     Family History  Problem Relation Age of Onset  . Heart attack Father 42  . Heart disease Father   . Colon cancer Neg Hx   . Ovarian cancer Mother     Allergies  Allergen Reactions  . Sulfa Antibiotics   . Sulfamethoxazole     REACTION: rash    Current  Outpatient Prescriptions on File Prior to Visit  Medication Sig Dispense Refill  . cetirizine (ZYRTEC) 10 MG tablet Take 10 mg by mouth daily.        Marland Kitchen latanoprost (XALATAN) 0.005 % ophthalmic solution Place 1 drop into both eyes daily.        Marland Kitchen lisinopril (PRINIVIL,ZESTRIL) 10 MG tablet Take 10 mg by mouth daily.        Marland Kitchen loratadine (CLARITIN) 10 MG tablet Take 10 mg by mouth daily.        . metoprolol tartrate (LOPRESSOR) 25 MG tablet Take 25 mg by mouth 2 (two) times daily.      . mometasone (NASONEX) 50 MCG/ACT nasal spray Place 2 sprays into the nose daily.        . Multiple Vitamins-Minerals (CENTRUM SILVER PO) Take 1 tablet by mouth daily.        Marland Kitchen omeprazole (PRILOSEC) 20 MG capsule Take 20 mg by mouth 2 (two) times daily.       . potassium chloride (KLOR-CON) 10 MEQ CR tablet  Take 10 mEq by mouth daily.       . Psyllium (METAMUCIL PO) Take by mouth 2 (two) times daily.        . simvastatin (ZOCOR) 40 MG tablet Take 40 mg by mouth at bedtime.        . tamsulosin (FLOMAX) 0.4 MG CAPS Take 0.4 mg by mouth daily.      Marland Kitchen warfarin (COUMADIN) 5 MG tablet Take as directed by coumadin clinic  40 tablet  3   No current facility-administered medications on file prior to visit.    BP 140/70  Pulse 72  Temp(Src) 98.5 F (36.9 C) (Oral)  Resp 20  Wt 205 lb (92.987 kg)  SpO2 96%       Review of Systems  Constitutional: Negative for fever, chills, appetite change and fatigue.  HENT: Positive for postnasal drip and rhinorrhea. Negative for congestion, dental problem, ear pain, hearing loss, sore throat, tinnitus, trouble swallowing and voice change.   Eyes: Negative for pain, discharge and visual disturbance.  Respiratory: Positive for cough. Negative for chest tightness, wheezing and stridor.   Cardiovascular: Negative for chest pain, palpitations and leg swelling.  Gastrointestinal: Negative for nausea, vomiting, abdominal pain, diarrhea, constipation, blood in stool and abdominal  distention.  Genitourinary: Negative for urgency, hematuria, flank pain, discharge, difficulty urinating and genital sores.  Musculoskeletal: Negative for arthralgias, back pain, gait problem, joint swelling, myalgias and neck stiffness.  Skin: Negative for rash.  Neurological: Negative for dizziness, syncope, speech difficulty, weakness, numbness and headaches.  Hematological: Negative for adenopathy. Does not bruise/bleed easily.  Psychiatric/Behavioral: Negative for behavioral problems and dysphoric mood. The patient is not nervous/anxious.        Objective:   Physical Exam  Constitutional: He is oriented to person, place, and time. He appears well-developed.  HENT:  Head: Normocephalic.  Right Ear: External ear normal.  Left Ear: External ear normal.  Eyes: Conjunctivae and EOM are normal.  Neck: Normal range of motion.  Cardiovascular: Normal rate and normal heart sounds.   Pulmonary/Chest: Breath sounds normal.  Abdominal: Bowel sounds are normal.  Musculoskeletal: Normal range of motion. He exhibits no edema and no tenderness.  Neurological: He is alert and oriented to person, place, and time.  Psychiatric: He has a normal mood and affect. His behavior is normal.          Assessment & Plan:   Viral URI with cough. Will treat symptomatically Hypertension stable

## 2013-11-09 ENCOUNTER — Telehealth: Payer: Self-pay | Admitting: Family Medicine

## 2013-11-09 MED ORDER — AZITHROMYCIN 250 MG PO TABS: ORAL_TABLET | ORAL | Status: DC

## 2013-11-09 NOTE — Telephone Encounter (Signed)
Pt called to fu on refill.

## 2013-11-09 NOTE — Telephone Encounter (Signed)
Patient Information:  Caller Name: Babyboy  Phone: [phone]  Patient: Daaron, Dimarco  Gender: Male  DOB: Mar 11, 1929  Age: 80 Years  PCP: [name] [facility])  Office Follow Up:  Does the office need to follow up with this patient?: Yes  Instructions For The Office: appt/rx needs krs/can  RN Note:  Patient calling about bronchitis.  States seen in office 11/05/13 and diagnosed with viral cough, but states cough is not improved, and he has developed low grade fever to 100.4 and mild wheeze.  States cough is disruptive to sleep.  Right eye is also red and draiining pus.  Allergy to sulfa; Vigamox eye drops, 1 drop TID x 7 days, called in per standing orders to [facility].  Per cough protocol, emergent symptoms denied; advised appt today; no appts available in entire [facility] system within dispositioned time frame.  Info to office for MD review/appt workin/Rx/callback.  May reach patient/family at [phone].  krs/can  Symptoms  Reason For Call & Symptoms: cough, mild wheezing  Reviewed Health History In EMR: Yes  Reviewed Medications In EMR: Yes  Reviewed Allergies In EMR: Yes  Reviewed Surgeries / Procedures: Yes  Date of Onset of Symptoms: 11/10/2013  Any Fever: Yes  Fever Taken: Oral  Fever Time Of Reading: 08:30:00  Fever Last Reading: 100.4  Guideline(s) Used:  Cough  Eye - Pus or Discharge  Disposition Per Guideline:   Go to Office Now  Reason For Disposition Reached:   Wheezing is present  Advice Given:  N/A  Patient Will Follow Care Advice:  YES

## 2013-11-09 NOTE — Telephone Encounter (Signed)
Spoke to pt told him Rx for Azithromycin (Zpak) sent to pharmacy. Pt verbalized understanding. Pt also wanted to know if a Rx was sent for his wife. Told him I am not sure I only got a message on you. Pt asked check with Dr. Sarajane Jews to see if he sent Rx for wife. Told him I will give message to his nurse and someone will get back to him. Pt verbalized understanding.

## 2013-11-09 NOTE — Telephone Encounter (Signed)
Triage RN received call request from CSR concerning cough/congestion not improving on medication. When Triage RN called pt, he stated he has already spoken with Juliann Pulse in office who is going to speak with MD and f/u with him. He denied any further questions/concerns for Triage RN.

## 2013-11-09 NOTE — Telephone Encounter (Signed)
Please call in a prescription for azithromycin 250 mg #6 directions 2 tablet once daily for 3 consecutive days

## 2013-11-09 NOTE — Telephone Encounter (Signed)
Pt seen by Dr Raliegh Ip last Monday. Pt states he is not better, worse if anything. Pt feels needs antibiotic. Pls see note. Pharm: [facility]

## 2013-11-12 ENCOUNTER — Ambulatory Visit (INDEPENDENT_AMBULATORY_CARE_PROVIDER_SITE_OTHER)
Admission: RE | Admit: 2013-11-12 | Discharge: 2013-11-12 | Disposition: A | Discharge status: Home / Self Care | Payer: Medicare Other | Source: Ambulatory Visit | Attending: Family Medicine | Admitting: Family Medicine

## 2013-11-12 ENCOUNTER — Encounter: Payer: Self-pay | Admitting: Family Medicine

## 2013-11-12 ENCOUNTER — Ambulatory Visit (INDEPENDENT_AMBULATORY_CARE_PROVIDER_SITE_OTHER): Payer: Medicare Other | Admitting: Family Medicine

## 2013-11-12 VITALS — BP 140/70 | HR 71 | Temp 98.8°F | Wt 208.0 lb

## 2013-11-12 DIAGNOSIS — J181 Lobar pneumonia, unspecified organism: Principal | ICD-10-CM

## 2013-11-12 DIAGNOSIS — R509 Fever, unspecified: Secondary | ICD-10-CM | POA: Diagnosis not present

## 2013-11-12 DIAGNOSIS — J189 Pneumonia, unspecified organism: Secondary | ICD-10-CM

## 2013-11-12 DIAGNOSIS — R059 Cough, unspecified: Secondary | ICD-10-CM | POA: Diagnosis not present

## 2013-11-12 DIAGNOSIS — R05 Cough: Secondary | ICD-10-CM | POA: Diagnosis not present

## 2013-11-12 MED ORDER — CEFTRIAXONE SODIUM 1 G IJ SOLR
1.0000 g | Freq: Once | INTRAMUSCULAR | Status: AC
  Administered 2013-11-12: 1 g via INTRAMUSCULAR

## 2013-11-12 MED ORDER — LEVOFLOXACIN 500 MG PO TABS: 500.0000 mg | ORAL_TABLET | Freq: Every day | ORAL | Status: AC

## 2013-11-12 NOTE — Progress Notes (Signed)
Pre visit review using our clinic review tool, if applicable. No additional management support is needed unless otherwise documented below in the visit note. 

## 2013-11-12 NOTE — Addendum Note (Signed)
Addended by: Aggie Hacker A on: 11/12/2013 04:34 PM   Modules accepted: Orders

## 2013-11-12 NOTE — Progress Notes (Signed)
   Subjective:    Patient ID: [name] [name], male    DOB: 23-Apr-1929, 80 y.o.   MRN: [id number]  HPI Here for continuing chest congestion, coughing up green sputum, and night sweats despite taking a Zpack. He was seen here on 10-30-13 and was felt to have a viral URI. He did not improve so on 11-09-13 a Zpack was called in. No NVD.    Review of Systems  Constitutional: Positive for fever, chills and diaphoresis.  HENT: Negative.   Eyes: Negative.   Respiratory: Positive for cough, chest tightness and shortness of breath. Negative for wheezing.   Cardiovascular: Negative.        Objective:   Physical Exam  Constitutional: He appears well-developed and well-nourished.  HENT:  Right Ear: External ear normal.  Left Ear: External ear normal.  Nose: Nose normal.  Mouth/Throat: Oropharynx is clear and moist.  Eyes: Conjunctivae are normal.  Pulmonary/Chest: Effort normal. No respiratory distress. He has no wheezes.  Rales are present at the right base   Lymphadenopathy:    He has no cervical adenopathy.          Assessment & Plan:  RLL pneumonia. Stop the Zpack and begin 10 days of Levaquin. Given a shot of Rocephin. Get a CXR today. Recheck in one week

## 2013-11-19 ENCOUNTER — Ambulatory Visit (INDEPENDENT_AMBULATORY_CARE_PROVIDER_SITE_OTHER): Payer: Medicare Other | Admitting: Family Medicine

## 2013-11-19 ENCOUNTER — Encounter: Payer: Self-pay | Admitting: Family Medicine

## 2013-11-19 VITALS — BP 128/64 | HR 63 | Temp 98.8°F | Wt 198.0 lb

## 2013-11-19 DIAGNOSIS — J189 Pneumonia, unspecified organism: Secondary | ICD-10-CM

## 2013-11-19 DIAGNOSIS — J181 Lobar pneumonia, unspecified organism: Principal | ICD-10-CM

## 2013-11-19 NOTE — Progress Notes (Signed)
   Subjective:    Patient ID: [name] [name], male    DOB: Jun 03, 1929, 80 y.o.   MRN: [id number]  HPI Here to follow up a RLL pneumonia we found on exam one week ago. His CXR was clear but he had distinct rales on exam. He was given a Rocephin shot and was started on Levaquin. He feels much better now, less coughing and more strength. No fever.    Review of Systems  Constitutional: Negative.   Respiratory: Positive for cough. Negative for chest tightness, shortness of breath and wheezing.        Objective:   Physical Exam  Constitutional: He appears well-developed and well-nourished. No distress.  Cardiovascular: Normal rate, regular rhythm, normal heart sounds and intact distal pulses.   Pulmonary/Chest: Effort normal and breath sounds normal. No respiratory distress. He has no wheezes.  Slight crackles at the right base           Assessment & Plan:  He will finish up the last few Levaquin tabs and recheck prn

## 2013-11-19 NOTE — Progress Notes (Signed)
Pre visit review using our clinic review tool, if applicable. No additional management support is needed unless otherwise documented below in the visit note. 

## 2013-11-29 DIAGNOSIS — H4011X0 Primary open-angle glaucoma, stage unspecified: Secondary | ICD-10-CM | POA: Diagnosis not present

## 2013-11-29 DIAGNOSIS — H409 Unspecified glaucoma: Secondary | ICD-10-CM | POA: Diagnosis not present

## 2013-12-03 ENCOUNTER — Ambulatory Visit (INDEPENDENT_AMBULATORY_CARE_PROVIDER_SITE_OTHER): Payer: Medicare Other | Admitting: Pharmacist

## 2013-12-03 DIAGNOSIS — Z5181 Encounter for therapeutic drug level monitoring: Secondary | ICD-10-CM | POA: Diagnosis not present

## 2013-12-03 DIAGNOSIS — I4891 Unspecified atrial fibrillation: Secondary | ICD-10-CM

## 2013-12-03 DIAGNOSIS — Z7901 Long term (current) use of anticoagulants: Secondary | ICD-10-CM

## 2013-12-03 LAB — POCT INR: INR: 2.4

## 2013-12-31 ENCOUNTER — Ambulatory Visit (INDEPENDENT_AMBULATORY_CARE_PROVIDER_SITE_OTHER): Payer: Medicare Other

## 2013-12-31 DIAGNOSIS — Z5181 Encounter for therapeutic drug level monitoring: Secondary | ICD-10-CM | POA: Diagnosis not present

## 2013-12-31 DIAGNOSIS — I4891 Unspecified atrial fibrillation: Secondary | ICD-10-CM

## 2013-12-31 DIAGNOSIS — Z7901 Long term (current) use of anticoagulants: Secondary | ICD-10-CM | POA: Diagnosis not present

## 2013-12-31 LAB — POCT INR: INR: 2.7

## 2014-01-02 DIAGNOSIS — D235 Other benign neoplasm of skin of trunk: Secondary | ICD-10-CM | POA: Diagnosis not present

## 2014-01-02 DIAGNOSIS — Z85820 Personal history of malignant melanoma of skin: Secondary | ICD-10-CM | POA: Diagnosis not present

## 2014-01-02 DIAGNOSIS — B079 Viral wart, unspecified: Secondary | ICD-10-CM | POA: Diagnosis not present

## 2014-01-02 DIAGNOSIS — L570 Actinic keratosis: Secondary | ICD-10-CM | POA: Diagnosis not present

## 2014-01-16 ENCOUNTER — Other Ambulatory Visit: Payer: Self-pay | Admitting: *Deleted

## 2014-01-16 MED ORDER — WARFARIN SODIUM 5 MG PO TABS: ORAL_TABLET | ORAL | Status: DC

## 2014-02-05 ENCOUNTER — Ambulatory Visit (INDEPENDENT_AMBULATORY_CARE_PROVIDER_SITE_OTHER): Payer: Medicare Other

## 2014-02-05 DIAGNOSIS — I4891 Unspecified atrial fibrillation: Secondary | ICD-10-CM

## 2014-02-05 DIAGNOSIS — Z7901 Long term (current) use of anticoagulants: Secondary | ICD-10-CM | POA: Diagnosis not present

## 2014-02-05 DIAGNOSIS — Z5181 Encounter for therapeutic drug level monitoring: Secondary | ICD-10-CM | POA: Diagnosis not present

## 2014-02-05 LAB — POCT INR: INR: 3.3

## 2014-02-18 ENCOUNTER — Telehealth: Payer: Self-pay | Admitting: Family Medicine

## 2014-02-20 NOTE — Telephone Encounter (Signed)
error 

## 2014-02-27 ENCOUNTER — Ambulatory Visit (INDEPENDENT_AMBULATORY_CARE_PROVIDER_SITE_OTHER): Payer: Medicare Other | Admitting: *Deleted

## 2014-02-27 DIAGNOSIS — Z7901 Long term (current) use of anticoagulants: Secondary | ICD-10-CM

## 2014-02-27 DIAGNOSIS — I4891 Unspecified atrial fibrillation: Secondary | ICD-10-CM

## 2014-02-27 DIAGNOSIS — Z5181 Encounter for therapeutic drug level monitoring: Secondary | ICD-10-CM

## 2014-02-27 LAB — POCT INR: INR: 3.1

## 2014-03-04 DIAGNOSIS — H409 Unspecified glaucoma: Secondary | ICD-10-CM | POA: Diagnosis not present

## 2014-03-04 DIAGNOSIS — H4011X0 Primary open-angle glaucoma, stage unspecified: Secondary | ICD-10-CM | POA: Diagnosis not present

## 2014-03-09 ENCOUNTER — Encounter (HOSPITAL_COMMUNITY): Payer: Self-pay | Admitting: Emergency Medicine

## 2014-03-09 ENCOUNTER — Emergency Department (HOSPITAL_COMMUNITY): Payer: Medicare Other

## 2014-03-09 ENCOUNTER — Emergency Department (HOSPITAL_COMMUNITY)
Admission: EM | Admit: 2014-03-09 | Discharge: 2014-03-10 | Disposition: A | Discharge status: Home / Self Care | Payer: Medicare Other | Attending: Emergency Medicine | Admitting: Emergency Medicine

## 2014-03-09 DIAGNOSIS — Z860100 Personal history of colon polyps, unspecified: Secondary | ICD-10-CM | POA: Insufficient documentation

## 2014-03-09 DIAGNOSIS — Z85820 Personal history of malignant melanoma of skin: Secondary | ICD-10-CM | POA: Diagnosis not present

## 2014-03-09 DIAGNOSIS — R079 Chest pain, unspecified: Secondary | ICD-10-CM | POA: Diagnosis not present

## 2014-03-09 DIAGNOSIS — Z87442 Personal history of urinary calculi: Secondary | ICD-10-CM | POA: Insufficient documentation

## 2014-03-09 DIAGNOSIS — I4891 Unspecified atrial fibrillation: Secondary | ICD-10-CM | POA: Insufficient documentation

## 2014-03-09 DIAGNOSIS — E785 Hyperlipidemia, unspecified: Secondary | ICD-10-CM | POA: Insufficient documentation

## 2014-03-09 DIAGNOSIS — Z7901 Long term (current) use of anticoagulants: Secondary | ICD-10-CM | POA: Diagnosis not present

## 2014-03-09 DIAGNOSIS — Z8669 Personal history of other diseases of the nervous system and sense organs: Secondary | ICD-10-CM | POA: Diagnosis not present

## 2014-03-09 DIAGNOSIS — R002 Palpitations: Secondary | ICD-10-CM | POA: Insufficient documentation

## 2014-03-09 DIAGNOSIS — Z8709 Personal history of other diseases of the respiratory system: Secondary | ICD-10-CM | POA: Insufficient documentation

## 2014-03-09 DIAGNOSIS — Z79899 Other long term (current) drug therapy: Secondary | ICD-10-CM | POA: Insufficient documentation

## 2014-03-09 DIAGNOSIS — I491 Atrial premature depolarization: Secondary | ICD-10-CM

## 2014-03-09 DIAGNOSIS — K219 Gastro-esophageal reflux disease without esophagitis: Secondary | ICD-10-CM | POA: Diagnosis not present

## 2014-03-09 DIAGNOSIS — I10 Essential (primary) hypertension: Secondary | ICD-10-CM | POA: Diagnosis not present

## 2014-03-09 LAB — CBC
HEMATOCRIT: 38.9 % — AB (ref 39.0–52.0)
Hemoglobin: 12.8 g/dL — ABNORMAL LOW (ref 13.0–17.0)
MCH: 31.5 pg (ref 26.0–34.0)
MCHC: 32.9 g/dL (ref 30.0–36.0)
MCV: 95.8 fL (ref 78.0–100.0)
Platelets: 172 10*3/uL (ref 150–400)
RBC: 4.06 MIL/uL — ABNORMAL LOW (ref 4.22–5.81)
RDW: 13.7 % (ref 11.5–15.5)
WBC: 8.7 10*3/uL (ref 4.0–10.5)

## 2014-03-09 LAB — PRO B NATRIURETIC PEPTIDE: Pro B Natriuretic peptide (BNP): 127.2 pg/mL (ref 0–450)

## 2014-03-09 LAB — BASIC METABOLIC PANEL
BUN: 23 mg/dL (ref 6–23)
CALCIUM: 8.7 mg/dL (ref 8.4–10.5)
CHLORIDE: 104 meq/L (ref 96–112)
CO2: 27 mEq/L (ref 19–32)
CREATININE: 0.98 mg/dL (ref 0.50–1.35)
GFR calc non Af Amer: 74 mL/min — ABNORMAL LOW (ref >90)
GFR, EST AFRICAN AMERICAN: 86 mL/min — AB (ref >90)
Glucose, Bld: 120 mg/dL — ABNORMAL HIGH (ref 70–99)
Potassium: 3.8 mEq/L (ref 3.7–5.3)
Sodium: 143 mEq/L (ref 137–147)

## 2014-03-09 LAB — TROPONIN I: Troponin I: <0.3 ng/mL (ref <0.30)

## 2014-03-09 NOTE — ED Notes (Signed)
Pt states that Dr. Percival Spanish left orders for the pt should he present to the ER with A-fib. Pt states that his orders are to try a medication (pt is unsure of the name), and to observe the pt.   Pt states that she has been having periods of lightheadedness and tachycardia with exertion since Thursday. Pt states that he has a hx of a-fib, but it is controlled. Pt is not in a-fib at this time.

## 2014-03-09 NOTE — ED Notes (Signed)
The pt has some lt upper chest tightness since thursda am.  He has a history of af and he thinks he may be again.  He gets very sob with exertion and weakness.

## 2014-03-09 NOTE — ED Notes (Signed)
Lab reports that the pt's blue top blood tube was not full enough, and needs to be redrawn.

## 2014-03-10 LAB — PROTIME-INR
INR: 2.53 — AB (ref 0.00–1.49)
Prothrombin Time: 26.4 seconds — ABNORMAL HIGH (ref 11.6–15.2)

## 2014-03-10 NOTE — Discharge Instructions (Signed)
Please follow up with your cardiologist next week.  Continue your current medications.  Increase your fluid intake.  Return to the ER for worsening condition or new concerning symptoms.   Premature Beats A premature beat is an extra heartbeat that happens earlier than normal. Premature beats are called premature atrial contractions (PACs) or premature ventricular contractions (PVCs) depending on the area of the heart where they start. CAUSES  Premature beats may be brought on by a variety of factors including:  Emotional stress.  Lack of sleep.  Caffeine.  Asthma medicines.  Stimulants.  Herbal teas.  Dietary supplements.  Alcohol. In most cases, premature beats are not dangerous and are not a sign of serious heart disease. Most patients evaluated for premature beats have completely normal heart function. Rarely, premature beats may be a sign of more significant heart problems or medical illness. SYMPTOMS  Premature beats may cause palpitations. This means you feel like your heart is skipping a beat or beating harder than usual. Sometimes, slight chest pain occurs with premature beats, lasting only a few seconds. This pain has been described as a "flopping" feeling inside the chest. In many cases, premature beats do not cause any symptoms and they are only detected when an electrocardiography test (EKG) or heart monitoring is performed. DIAGNOSIS  Your caregiver may run some tests to evaluate your heart such as an EKG or echocardiography. You may need to wear a portable heart monitor for several days to record the electrical activity of your heart. Blood testing may also be performed to check your electrolytes and thyroid function. TREATMENT  Premature beats usually go away with rest. If the problem continues, your caregiver will determine a treatment plan for you.  HOME CARE INSTRUCTIONS  Get plenty of rest over the next few days until your symptoms improve.  Avoid coffee, tea,  alcohol, and soda (pop, cola).  Do not smoke. SEEK MEDICAL CARE IF:  Your symptoms continue after 1 to 2 days of rest.  You have new symptoms, such as chest pain or trouble breathing. SEEK IMMEDIATE MEDICAL CARE IF:  You have severe chest pain or abdominal pain.  You have pain that radiates into the neck, arm, or jaw.  You faint or have extreme weakness.  You have shortness of breath.  Your heartbeat races for more than 5 seconds. MAKE SURE YOU:  Understand these instructions.  Will watch your condition.  Will get help right away if you are not doing well or get worse. Document Released: 12/02/2004 Document Revised: 01/17/2012 Document Reviewed: 06/28/2011 [facility] Patient Information 2014 [place], [place].

## 2014-03-10 NOTE — ED Provider Notes (Signed)
CSN: [id number]     Arrival date & time 03/09/14  2206 History   First MD Initiated Contact with Patient 03/09/14 2256     Chief Complaint  Patient presents with  . Chest Pain     (Consider location/radiation/quality/duration/timing/severity/associated sxs/prior Treatment) HPI 80 year old male presents to emergency room with complaint of palpitations.  Patient reports on Thursday while running he felt extra beats.  Patient reports history of paroxysmal A. fib.  Since Thursday, patient has had several short episodes where he has felt palpitations. He denies any chest pain, no shortness of breath.  He has been feeling slightly "off".  No nausea vomiting diarrhea fever or chills.  He has been compliant with his medications.  Patient runs a mile every morning, and has been able to continue with this despite the occasional palpitations.  Patient is followed by Dr. Percival Spanish  Past Medical History  Diagnosis Date  . Allergic rhinitis   . Hyperlipidemia   . HTN (hypertension)   . Low back pain   . Paroxysmal atrial fibrillation     sees Dr. Percival Spanish   . GERD (gastroesophageal reflux disease)   . Glaucoma   . Hx of colonic polyps     tubular adenoma   . Nephrolithiasis   . Hemorrhoids     internal  . Pancolonic diverticulosis   . Cancer     FACIAL MELONOMA  . Cataract     REMOVED   Past Surgical History  Procedure Laterality Date  . Cholecystectomy    . Vasectomy    . Right inguinal hernia repair    . Refractive surgery    . Ruptured blood vessel in the bladder    . Removed 2 stones from left ureter    . Blood vessel repair in bladder    . Colonoscopy  06-29-12    per Dr. [name], adenomatous polyps, repeat in 3 yrs    Family History  Problem Relation Age of Onset  . Heart attack Father 67  . Heart disease Father   . Colon cancer Neg Hx   . Ovarian cancer Mother    History  Substance Use Topics  . Smoking status: Never Smoker   . Smokeless tobacco: Never Used  . Alcohol  Use: No    Review of Systems   See History of Present Illness; otherwise all other systems are reviewed and negative  Allergies  Sulfa antibiotics and Sulfamethoxazole  Home Medications   Prior to Admission medications   Medication Sig Start Date End Date Taking? Authorizing Provider  acetaminophen (TYLENOL) 325 MG tablet Take 325-650 mg by mouth 2 (two) times daily as needed for mild pain.   Yes Historical Provider, MD  cetirizine (ZYRTEC) 10 MG tablet Take 10 mg by mouth every morning.    Yes Historical Provider, MD  latanoprost (XALATAN) 0.005 % ophthalmic solution Place 1 drop into both eyes at bedtime.    Yes Historical Provider, MD  lisinopril (PRINIVIL,ZESTRIL) 10 MG tablet Take 10 mg by mouth at bedtime.    Yes Historical Provider, MD  loratadine (CLARITIN) 10 MG tablet Take 10 mg by mouth at bedtime.    Yes Historical Provider, MD  metoprolol tartrate (LOPRESSOR) 25 MG tablet Take 25 mg by mouth 2 (two) times daily.   Yes Historical Provider, MD  mometasone (NASONEX) 50 MCG/ACT nasal spray Place 2 sprays into both nostrils daily as needed (allergies).    Yes Historical Provider, MD  Multiple Vitamins-Minerals (CENTRUM SILVER PO) Take 1 tablet by mouth  every morning.    Yes Historical Provider, MD  omeprazole (PRILOSEC) 20 MG capsule Take 20 mg by mouth 2 (two) times daily.    Yes Historical Provider, MD  potassium chloride (KLOR-CON) 10 MEQ CR tablet Take 10 mEq by mouth every morning.    Yes Historical Provider, MD  Psyllium (METAMUCIL PO) Take 30 mLs by mouth 2 (two) times daily.    Yes Historical Provider, MD  simvastatin (ZOCOR) 40 MG tablet Take 40 mg by mouth at bedtime.     Yes Historical Provider, MD  tamsulosin (FLOMAX) 0.4 MG CAPS Take 0.4 mg by mouth every evening.    Yes Historical Provider, MD  warfarin (COUMADIN) 5 MG tablet Take 5 mg by mouth every evening.   Yes Historical Provider, MD   BP 124/55  Pulse 51  Temp(Src) 98 F (36.7 C) (Oral)  Resp 22  Ht 5'  11" (1.803 m)  Wt 207 lb 5 oz (94.036 kg)  BMI 28.93 kg/m2  SpO2 99% Physical Exam  Nursing note and vitals reviewed. Constitutional: He is oriented to person, place, and time. He appears well-developed and well-nourished.  HENT:  Head: Normocephalic and atraumatic.  Nose: Nose normal.  Mouth/Throat: Oropharynx is clear and moist.  Eyes: Conjunctivae and EOM are normal. Pupils are equal, round, and reactive to light.  Neck: Normal range of motion. Neck supple. No JVD present. No tracheal deviation present. No thyromegaly present.  Cardiovascular: Normal rate, regular rhythm, normal heart sounds and intact distal pulses.  Exam reveals no gallop and no friction rub.   No murmur heard. Pulmonary/Chest: Effort normal and breath sounds normal. No stridor. No respiratory distress. He has no wheezes. He has no rales. He exhibits no tenderness.  Abdominal: Soft. Bowel sounds are normal. He exhibits no distension and no mass. There is no tenderness. There is no rebound and no guarding.  Musculoskeletal: Normal range of motion. He exhibits no edema and no tenderness.  Lymphadenopathy:    He has no cervical adenopathy.  Neurological: He is alert and oriented to person, place, and time. He exhibits normal muscle tone. Coordination normal.  Skin: Skin is warm and dry. No rash noted. No erythema. No pallor.  Psychiatric: He has a normal mood and affect. His behavior is normal. Judgment and thought content normal.    ED Course  Procedures (including critical care time) Labs Review Labs Reviewed  CBC - Abnormal; Notable for the following:    RBC 4.06 (*)    Hemoglobin 12.8 (*)    HCT 38.9 (*)    All other components within normal limits  BASIC METABOLIC PANEL - Abnormal; Notable for the following:    Glucose, Bld 120 (*)    GFR calc non Af Amer 74 (*)    GFR calc Af Amer 86 (*)    All other components within normal limits  PROTIME-INR - Abnormal; Notable for the following:    Prothrombin Time  26.4 (*)    INR 2.53 (*)    All other components within normal limits  PRO B NATRIURETIC PEPTIDE  TROPONIN I    Imaging Review Dg Chest Port 1 View  03/09/2014   CLINICAL DATA:  Chest pain  EXAM: PORTABLE CHEST - 1 VIEW  COMPARISON:  11/12/2013  FINDINGS: Chronic interstitial markings. No focal consolidation. No pleural effusion or pneumothorax.  Heart is top-normal in size/mildly enlarged.  IMPRESSION: No evidence of acute cardiopulmonary disease.   Electronically Signed   By: Henderson Newcomer.D.  On: 03/09/2014 23:40     EKG Interpretation   Date/Time:  Saturday Mar 09 2014 22:14:06 EDT Ventricular Rate:  56 PR Interval:  156 QRS Duration: 90 QT Interval:  414 QTC Calculation: 399 R Axis:   31 Text Interpretation:  Sinus bradycardia with Premature atrial complexes  Low voltage QRS Cannot rule out Anterior infarct , age undetermined  Abnormal ECG Reconfirmed by Stepfanie Yott  MD, Barton Want ([id number]) on 03/10/2014 12:34:25  AM      MDM   Final diagnoses:  Palpitations  PAC (premature atrial contraction)    80 year old male with a sense of palpitations.  He has the occasional PACs here tonight.  Discussed case with his cardiologist, Dr. Danella Deis who recommends no intervention at this time, followup in the clinic as needed.    Kalman Drape, MD 03/10/14 [phone]

## 2014-03-11 ENCOUNTER — Telehealth: Payer: Self-pay | Admitting: Cardiology

## 2014-03-11 NOTE — Telephone Encounter (Signed)
New problem   Pt was seen in ER this weekend and was told by Dr Percival Spanish to come in to see him this week. Please call pt concerning this matter. There were no appt avail

## 2014-03-11 NOTE — Telephone Encounter (Signed)
Worked in for tomorrow at 3:30 per pt's demand

## 2014-03-12 ENCOUNTER — Ambulatory Visit (INDEPENDENT_AMBULATORY_CARE_PROVIDER_SITE_OTHER): Payer: Medicare Other | Admitting: Cardiology

## 2014-03-12 ENCOUNTER — Encounter: Payer: Self-pay | Admitting: Cardiology

## 2014-03-12 VITALS — BP 153/73 | HR 65 | Ht 71.0 in | Wt 204.8 lb

## 2014-03-12 DIAGNOSIS — I10 Essential (primary) hypertension: Secondary | ICD-10-CM

## 2014-03-12 DIAGNOSIS — I4949 Other premature depolarization: Secondary | ICD-10-CM | POA: Diagnosis not present

## 2014-03-12 DIAGNOSIS — I4891 Unspecified atrial fibrillation: Secondary | ICD-10-CM | POA: Diagnosis not present

## 2014-03-12 DIAGNOSIS — I495 Sick sinus syndrome: Secondary | ICD-10-CM | POA: Diagnosis not present

## 2014-03-12 DIAGNOSIS — I493 Ventricular premature depolarization: Secondary | ICD-10-CM

## 2014-03-12 NOTE — Progress Notes (Signed)
HPI The patient presents for follow up of palpitations.  He has had paroxysmal atrial fib.  He was in the ED this weekend.  He had palpitations.  I reviewed these records.  He had PACs.  There were no other abnormalities.   However, he continued to not feel well although he knows his heart for the most part have been regular. He describes feeling tired and " having something not right in the head". His wife describes him as lifeless. He's had a cold and somewhat known left hand at times. However, he is not describing other neurologic symptoms. Is not describing new chest pressure, neck or arm discomfort. Again he may occasionally have some palpitations but does report that these are necessarily related to his symptoms. He's not had any new shortness of breath, PND or orthopnea. He has had no weight gain or edema.    Allergies  Allergen Reactions  . Sulfa Antibiotics Other (See Comments)    Bad problem on groin area  . Sulfamethoxazole     REACTION: rash    Current Outpatient Prescriptions  Medication Sig Dispense Refill  . acetaminophen (TYLENOL) 325 MG tablet Take 325-650 mg by mouth 2 (two) times daily as needed for mild pain.      . cetirizine (ZYRTEC) 10 MG tablet Take 10 mg by mouth every morning.       . latanoprost (XALATAN) 0.005 % ophthalmic solution Place 1 drop into both eyes at bedtime.       Marland Kitchen lisinopril (PRINIVIL,ZESTRIL) 10 MG tablet Take 10 mg by mouth at bedtime.       Marland Kitchen loratadine (CLARITIN) 10 MG tablet Take 10 mg by mouth at bedtime.       . metoprolol tartrate (LOPRESSOR) 25 MG tablet Take 25 mg by mouth 2 (two) times daily.      . mometasone (NASONEX) 50 MCG/ACT nasal spray Place 2 sprays into both nostrils daily as needed (allergies).       . Multiple Vitamins-Minerals (CENTRUM SILVER PO) Take 1 tablet by mouth every morning.       Marland Kitchen omeprazole (PRILOSEC) 20 MG capsule Take 20 mg by mouth 2 (two) times daily.       . potassium chloride (KLOR-CON) 10 MEQ CR tablet  Take 10 mEq by mouth every morning.       . Psyllium (METAMUCIL PO) Take 30 mLs by mouth 2 (two) times daily.       . simvastatin (ZOCOR) 40 MG tablet Take 40 mg by mouth at bedtime.        . tamsulosin (FLOMAX) 0.4 MG CAPS Take 0.4 mg by mouth every evening.       . warfarin (COUMADIN) 5 MG tablet Take 5 mg by mouth every evening.       No current facility-administered medications for this visit.    Past Medical History  Diagnosis Date  . Allergic rhinitis   . Hyperlipidemia   . HTN (hypertension)   . Low back pain   . Paroxysmal atrial fibrillation     sees Dr. Percival Spanish   . GERD (gastroesophageal reflux disease)   . Glaucoma   . Hx of colonic polyps     tubular adenoma   . Nephrolithiasis   . Hemorrhoids     internal  . Pancolonic diverticulosis   . Cancer     FACIAL MELONOMA  . Cataract     REMOVED    Past Surgical History  Procedure Laterality Date  . Cholecystectomy    .  Vasectomy    . Right inguinal hernia repair    . Refractive surgery    . Ruptured blood vessel in the bladder    . Removed 2 stones from left ureter    . Blood vessel repair in bladder    . Colonoscopy  06-29-12    per Dr. [name], adenomatous polyps, repeat in 3 yrs     ROS:  As stated in the HPI and negative for all other systems.   PHYSICAL EXAM BP 144/62  Pulse 76  Ht 5\' 11"  (1.803 m)  Wt 204 lb 12.8 oz (92.897 kg)  BMI 28.58 kg/m2 GENERAL:  Well appearing NECK:  No jugular venous distention, waveform within normal limits, carotid upstroke brisk and symmetric, no bruits, no thyromegaly LUNGS:  Clear to auscultation bilaterally BACK:  No CVA tenderness CHEST:  Unremarkable HEART:  PMI not displaced or sustained,S1 and S2 within normal limits, no S3, no clicks, no rubs, no murmurs, irregular ABD:  Flat, positive bowel sounds normal in frequency in pitch, no bruits, no rebound, no guarding, no midline pulsatile mass, no hepatomegaly, no splenomegaly EXT:  2 plus pulses throughout, no  edema, no cyanosis no clubbing  ASSESSMENT AND PLAN  WEAKNESS - I do not see a cardiac etiology for this.   I have reviewed the emergency room records. I reviewed his previous ultrasound and stress test in 2013. I do not think this is tied to any arrhythmia. I don't think there is another probable cardiac etiology. He did not drop his blood pressure today in the office with orthostatic readings. I have suggested followup of Dr. Sarajane Jews. At this time no change in cardiac medications as suggested.  ATRIAL FIBRILLATION -  The patient is currently in NSR. In the future if he has recurrent episodes he will go to the emergency room for a "pill in pocket" approach. If he does not convert I would admit him for rate control and I will consider management with Tikosyn. With his weight greater than 70 kg his dose of flecainide would be 300 mg.  HYPERTENSION -  The blood pressure is at target. No change in medications is indicated. We will continue with therapeutic lifestyle changes (TLC).

## 2014-03-13 ENCOUNTER — Ambulatory Visit (INDEPENDENT_AMBULATORY_CARE_PROVIDER_SITE_OTHER): Payer: Medicare Other | Admitting: Family Medicine

## 2014-03-13 ENCOUNTER — Encounter: Payer: Self-pay | Admitting: Family Medicine

## 2014-03-13 VITALS — BP 154/77 | HR 57 | Temp 98.7°F | Ht 71.0 in | Wt 200.0 lb

## 2014-03-13 DIAGNOSIS — R002 Palpitations: Secondary | ICD-10-CM

## 2014-03-13 DIAGNOSIS — E785 Hyperlipidemia, unspecified: Secondary | ICD-10-CM | POA: Diagnosis not present

## 2014-03-13 DIAGNOSIS — R42 Dizziness and giddiness: Secondary | ICD-10-CM | POA: Diagnosis not present

## 2014-03-13 DIAGNOSIS — I4891 Unspecified atrial fibrillation: Secondary | ICD-10-CM

## 2014-03-13 LAB — POCT URINALYSIS DIPSTICK
Bilirubin, UA: NEGATIVE
Blood, UA: NEGATIVE
Glucose, UA: NEGATIVE
Ketones, UA: NEGATIVE
LEUKOCYTES UA: NEGATIVE
Nitrite, UA: NEGATIVE
PH UA: 6
Spec Grav, UA: 1.02
Urobilinogen, UA: 0.2

## 2014-03-13 LAB — VITAMIN B12: Vitamin B-12: 668 pg/mL (ref 211–911)

## 2014-03-13 LAB — HEPATIC FUNCTION PANEL
ALBUMIN: 4.4 g/dL (ref 3.5–5.2)
ALT: 23 U/L (ref 0–53)
AST: 25 U/L (ref 0–37)
Alkaline Phosphatase: 36 U/L — ABNORMAL LOW (ref 39–117)
Bilirubin, Direct: 0.1 mg/dL (ref 0.0–0.3)
Total Bilirubin: 0.7 mg/dL (ref 0.2–1.2)
Total Protein: 6.8 g/dL (ref 6.0–8.3)

## 2014-03-13 LAB — TSH: TSH: 0.77 u[IU]/mL (ref 0.35–4.50)

## 2014-03-13 NOTE — Progress Notes (Signed)
   Subjective:    Patient ID: [name] [name], male    DOB: 02-16-1929, 80 y.o.   MRN: [id number]  HPI Here for episodes of lightheadedness over the past 5 days. He started with some chest palpitations while running on his treadmill at home. No SOB or chest pain. He went to the ER on 03-09-14 and had a normal evaluation, including CXR, EKG, and labs. He saw Dr. Percival Spanish yesterday, and he felt [name] [name] is doing well from a cardiac standpoint. Today [name] [name] describes occasional spells of lightheadedness which make him feel like he may pass out. No vertigo, no HA, and no other neurologic deficits.    Review of Systems  Constitutional: Negative.   HENT: Negative.   Eyes: Negative.   Respiratory: Negative.   Cardiovascular: Positive for palpitations. Negative for chest pain and leg swelling.  Neurological: Positive for light-headedness. Negative for dizziness, tremors, seizures, syncope, facial asymmetry, speech difficulty, weakness, numbness and headaches.       Objective:   Physical Exam  Constitutional: He is oriented to person, place, and time. He appears well-developed and well-nourished. No distress.  Eyes: Conjunctivae are normal. Pupils are equal, round, and reactive to light.  Neck: Neck supple. No thyromegaly present.  Cardiovascular: Normal rate, regular rhythm, normal heart sounds and intact distal pulses.   No carotid bruits   Pulmonary/Chest: Effort normal and breath sounds normal.  Lymphadenopathy:    He has no cervical adenopathy.  Neurological: He is alert and oriented to person, place, and time. No cranial nerve deficit. He exhibits normal muscle tone. Coordination normal.          Assessment & Plan:  These spells do not sound like they are from a cardiac issue. It is unclear what they may represent. We will get more labs today, and will set up carotid dopplers soon.

## 2014-03-13 NOTE — Progress Notes (Signed)
Pre visit review using our clinic review tool, if applicable. No additional management support is needed unless otherwise documented below in the visit note. 

## 2014-03-14 LAB — VITAMIN D 25 HYDROXY (VIT D DEFICIENCY, FRACTURES): Vit D, 25-Hydroxy: 37 ng/mL (ref 30–89)

## 2014-03-21 ENCOUNTER — Encounter (HOSPITAL_COMMUNITY): Payer: Medicare Other

## 2014-03-21 ENCOUNTER — Ambulatory Visit (INDEPENDENT_AMBULATORY_CARE_PROVIDER_SITE_OTHER): Payer: Medicare Other | Admitting: Pharmacist

## 2014-03-21 DIAGNOSIS — Z7901 Long term (current) use of anticoagulants: Secondary | ICD-10-CM | POA: Diagnosis not present

## 2014-03-21 DIAGNOSIS — I4891 Unspecified atrial fibrillation: Secondary | ICD-10-CM | POA: Diagnosis not present

## 2014-03-21 DIAGNOSIS — Z5181 Encounter for therapeutic drug level monitoring: Secondary | ICD-10-CM

## 2014-03-21 LAB — POCT INR: INR: 2.9

## 2014-03-26 ENCOUNTER — Encounter (HOSPITAL_COMMUNITY): Payer: Self-pay | Admitting: Emergency Medicine

## 2014-03-26 ENCOUNTER — Emergency Department (HOSPITAL_COMMUNITY)
Admission: EM | Admit: 2014-03-26 | Discharge: 2014-03-27 | Disposition: A | Discharge status: Home / Self Care | Payer: Medicare Other | Attending: Emergency Medicine | Admitting: Emergency Medicine

## 2014-03-26 DIAGNOSIS — Z79899 Other long term (current) drug therapy: Secondary | ICD-10-CM | POA: Diagnosis not present

## 2014-03-26 DIAGNOSIS — Z85820 Personal history of malignant melanoma of skin: Secondary | ICD-10-CM | POA: Insufficient documentation

## 2014-03-26 DIAGNOSIS — Z7901 Long term (current) use of anticoagulants: Secondary | ICD-10-CM | POA: Diagnosis not present

## 2014-03-26 DIAGNOSIS — I4891 Unspecified atrial fibrillation: Secondary | ICD-10-CM | POA: Diagnosis not present

## 2014-03-26 DIAGNOSIS — Z8709 Personal history of other diseases of the respiratory system: Secondary | ICD-10-CM | POA: Insufficient documentation

## 2014-03-26 DIAGNOSIS — E785 Hyperlipidemia, unspecified: Secondary | ICD-10-CM | POA: Diagnosis not present

## 2014-03-26 DIAGNOSIS — Z860100 Personal history of colon polyps, unspecified: Secondary | ICD-10-CM | POA: Insufficient documentation

## 2014-03-26 DIAGNOSIS — Z8669 Personal history of other diseases of the nervous system and sense organs: Secondary | ICD-10-CM | POA: Insufficient documentation

## 2014-03-26 DIAGNOSIS — I10 Essential (primary) hypertension: Secondary | ICD-10-CM | POA: Insufficient documentation

## 2014-03-26 DIAGNOSIS — R000 Tachycardia, unspecified: Secondary | ICD-10-CM | POA: Insufficient documentation

## 2014-03-26 DIAGNOSIS — K219 Gastro-esophageal reflux disease without esophagitis: Secondary | ICD-10-CM | POA: Diagnosis not present

## 2014-03-26 DIAGNOSIS — Z87442 Personal history of urinary calculi: Secondary | ICD-10-CM | POA: Diagnosis not present

## 2014-03-26 LAB — COMPREHENSIVE METABOLIC PANEL
ALBUMIN: 4.3 g/dL (ref 3.5–5.2)
ALK PHOS: 49 U/L (ref 39–117)
ALT: 28 U/L (ref 0–53)
AST: 30 U/L (ref 0–37)
BILIRUBIN TOTAL: 0.3 mg/dL (ref 0.3–1.2)
BUN: 27 mg/dL — AB (ref 6–23)
CHLORIDE: 104 meq/L (ref 96–112)
CO2: 26 mEq/L (ref 19–32)
Calcium: 9.2 mg/dL (ref 8.4–10.5)
Creatinine, Ser: 1.1 mg/dL (ref 0.50–1.35)
GFR calc Af Amer: 70 mL/min — ABNORMAL LOW (ref >90)
GFR calc non Af Amer: 61 mL/min — ABNORMAL LOW (ref >90)
Glucose, Bld: 132 mg/dL — ABNORMAL HIGH (ref 70–99)
POTASSIUM: 4.2 meq/L (ref 3.7–5.3)
Sodium: 143 mEq/L (ref 137–147)
Total Protein: 7.1 g/dL (ref 6.0–8.3)

## 2014-03-26 LAB — CBC WITH DIFFERENTIAL/PLATELET
BASOS PCT: 0 % (ref 0–1)
Basophils Absolute: 0 10*3/uL (ref 0.0–0.1)
Eosinophils Absolute: 0.4 10*3/uL (ref 0.0–0.7)
Eosinophils Relative: 4 % (ref 0–5)
HCT: 41.6 % (ref 39.0–52.0)
HEMOGLOBIN: 13.7 g/dL (ref 13.0–17.0)
LYMPHS ABS: 3 10*3/uL (ref 0.7–4.0)
Lymphocytes Relative: 34 % (ref 12–46)
MCH: 32.2 pg (ref 26.0–34.0)
MCHC: 32.9 g/dL (ref 30.0–36.0)
MCV: 97.7 fL (ref 78.0–100.0)
MONOS PCT: 11 % (ref 3–12)
Monocytes Absolute: 1 10*3/uL (ref 0.1–1.0)
NEUTROS ABS: 4.4 10*3/uL (ref 1.7–7.7)
NEUTROS PCT: 51 % (ref 43–77)
Platelets: 185 10*3/uL (ref 150–400)
RBC: 4.26 MIL/uL (ref 4.22–5.81)
RDW: 13.7 % (ref 11.5–15.5)
WBC: 8.8 10*3/uL (ref 4.0–10.5)

## 2014-03-26 MED ORDER — FLECAINIDE ACETATE 100 MG PO TABS
300.0000 mg | ORAL_TABLET | Freq: Once | ORAL | Status: AC
  Administered 2014-03-27: 300 mg via ORAL
  Filled 2014-03-26: qty 3

## 2014-03-26 MED ORDER — DILTIAZEM HCL 25 MG/5ML IV SOLN: 10.0000 mg | Freq: Once | INTRAVENOUS | Status: DC

## 2014-03-26 NOTE — ED Notes (Signed)
Pt. reports palpitations onset this evening , denies chest pain / no SOB , pt. stated history of Afib instructed by Dr. Percival Spanish to go to ER .

## 2014-03-26 NOTE — ED Provider Notes (Signed)
CSN: [id number]     Arrival date & time 03/26/14  2241 History   First MD Initiated Contact with Patient 03/26/14 2325     Chief Complaint  Patient presents with  . Palpitations     (Consider location/radiation/quality/duration/timing/severity/associated sxs/prior Treatment) HPI Tissue presents with palpitations starting this evening around 9:30 PM. Palpitations described as fast and irregular. He has a history of proximal atrial fibrillation. He denies any chest pain or shortness of breath. Denies any lightheadedness or dizziness. He has no nausea or vomiting. He was seen by his cardiologist 2 weeks ago who suggested that when he had episodes of atrial fibrillation that he come to the emergency department. It is suggested that he be given 300 mg of flecainide to see if he would convert to a normal sinus rhythm. If not, patient would need to be admitted for rate control. Patient states last INR was 2.9. This last week. Past Medical History  Diagnosis Date  . Allergic rhinitis   . Hyperlipidemia   . HTN (hypertension)   . Low back pain   . Paroxysmal atrial fibrillation     sees Dr. Percival Spanish   . GERD (gastroesophageal reflux disease)   . Glaucoma   . Hx of colonic polyps     tubular adenoma   . Nephrolithiasis   . Hemorrhoids     internal  . Pancolonic diverticulosis   . Cancer     FACIAL MELONOMA  . Cataract     REMOVED   Past Surgical History  Procedure Laterality Date  . Cholecystectomy    . Vasectomy    . Right inguinal hernia repair    . Refractive surgery    . Ruptured blood vessel in the bladder    . Removed 2 stones from left ureter    . Blood vessel repair in bladder    . Colonoscopy  06-29-12    per Dr. [name], adenomatous polyps, repeat in 3 yrs    Family History  Problem Relation Age of Onset  . Heart attack Father 78  . Heart disease Father   . Colon cancer Neg Hx   . Ovarian cancer Mother    History  Substance Use Topics  . Smoking status: Never  Smoker   . Smokeless tobacco: Never Used  . Alcohol Use: No    Review of Systems  Constitutional: Negative for fever and chills.  Respiratory: Negative for cough and shortness of breath.   Cardiovascular: Positive for palpitations. Negative for chest pain and leg swelling.  Gastrointestinal: Negative for nausea, vomiting and abdominal pain.  Musculoskeletal: Negative for back pain, neck pain and neck stiffness.  Skin: Negative for rash and wound.  Neurological: Negative for dizziness, syncope, speech difficulty, weakness, light-headedness, numbness and headaches.  All other systems reviewed and are negative.     Allergies  Sulfa antibiotics and Sulfamethoxazole  Home Medications   Prior to Admission medications   Medication Sig Start Date End Date Taking? Authorizing Provider  acetaminophen (TYLENOL) 325 MG tablet Take 325-650 mg by mouth 2 (two) times daily as needed for mild pain.    Historical Provider, MD  cetirizine (ZYRTEC) 10 MG tablet Take 10 mg by mouth every morning.     Historical Provider, MD  latanoprost (XALATAN) 0.005 % ophthalmic solution Place 1 drop into both eyes at bedtime.     Historical Provider, MD  lisinopril (PRINIVIL,ZESTRIL) 10 MG tablet Take 10 mg by mouth at bedtime.     Historical Provider, MD  loratadine (CLARITIN)  10 MG tablet Take 10 mg by mouth at bedtime.     Historical Provider, MD  metoprolol tartrate (LOPRESSOR) 25 MG tablet Take 25 mg by mouth 2 (two) times daily.    Historical Provider, MD  mometasone (NASONEX) 50 MCG/ACT nasal spray Place 2 sprays into both nostrils daily as needed (allergies).     Historical Provider, MD  Multiple Vitamins-Minerals (CENTRUM SILVER PO) Take 1 tablet by mouth every morning.     Historical Provider, MD  omeprazole (PRILOSEC) 20 MG capsule Take 20 mg by mouth 2 (two) times daily.     Historical Provider, MD  potassium chloride (KLOR-CON) 10 MEQ CR tablet Take 10 mEq by mouth every morning.     Historical  Provider, MD  Psyllium (METAMUCIL PO) Take 30 mLs by mouth 2 (two) times daily.     Historical Provider, MD  simvastatin (ZOCOR) 40 MG tablet Take 40 mg by mouth at bedtime.      Historical Provider, MD  tamsulosin (FLOMAX) 0.4 MG CAPS Take 0.4 mg by mouth every evening.     Historical Provider, MD  warfarin (COUMADIN) 5 MG tablet Take 5 mg by mouth every evening.    Historical Provider, MD   BP 117/78  Pulse 118  Temp(Src) 98.5 F (36.9 C) (Oral)  Resp 16  Ht 5\' 11"  (1.803 m)  Wt 198 lb (89.812 kg)  BMI 27.63 kg/m2  SpO2 96% Physical Exam  Nursing note and vitals reviewed. Constitutional: He is oriented to person, place, and time. He appears well-developed and well-nourished. No distress.  HENT:  Head: Normocephalic and atraumatic.  Mouth/Throat: Oropharynx is clear and moist.  Eyes: EOM are normal. Pupils are equal, round, and reactive to light.  Neck: Normal range of motion. Neck supple.  Cardiovascular:  Tachycardia, irregularly irregular rhythm  Pulmonary/Chest: Effort normal and breath sounds normal. No respiratory distress. He has no wheezes. He has no rales. He exhibits no tenderness.  Abdominal: Soft. Bowel sounds are normal. He exhibits no distension and no mass. There is no tenderness. There is no rebound and no guarding.  Musculoskeletal: Normal range of motion. He exhibits no edema and no tenderness.  No calf swelling or tenderness.  Neurological: He is alert and oriented to person, place, and time.  Moves all extremities without deficit. Sensation is grossly intact.  Skin: Skin is warm and dry. No rash noted. No erythema.  Psychiatric: He has a normal mood and affect. His behavior is normal.    ED Course  Procedures (including critical care time) Labs Review Labs Reviewed  CBC WITH DIFFERENTIAL  COMPREHENSIVE METABOLIC PANEL  PROTIME-INR  APTT  TROPONIN I    Imaging Review No results found.   EKG Interpretation   Date/Time:  Tuesday Mar 26 2014  22:48:03 EDT Ventricular Rate:  115 PR Interval:    QRS Duration: 84 QT Interval:  320 QTC Calculation: 442 R Axis:   4 Text Interpretation:  Atrial fibrillation with rapid ventricular response  Anterior infarct , age undetermined Abnormal ECG Confirmed by Lita Mains   MD, Jakiah Bienaime ([id number]) on 03/26/2014 11:27:26 PM      MDM   Final diagnoses:  None    Flecainide 300 mg given in the emergency department. We'll monitor closely.  Patient heart rate is down to under 100 beats per minute. He he remained asymptomatic. Discussed with Dr. Elias Else was on call for cardiology. He went spoke to the patient and agrees that patient can be discharged home to followup with Dr. Archie Patten  as an outpatient. Return precautions have been given.  Julianne Rice, MD 03/27/14 [phone]

## 2014-03-27 ENCOUNTER — Ambulatory Visit (HOSPITAL_COMMUNITY): Payer: Medicare Other | Attending: Internal Medicine | Admitting: *Deleted

## 2014-03-27 ENCOUNTER — Telehealth: Payer: Self-pay | Admitting: Cardiology

## 2014-03-27 DIAGNOSIS — I6529 Occlusion and stenosis of unspecified carotid artery: Secondary | ICD-10-CM | POA: Diagnosis not present

## 2014-03-27 DIAGNOSIS — N200 Calculus of kidney: Secondary | ICD-10-CM | POA: Diagnosis not present

## 2014-03-27 DIAGNOSIS — R42 Dizziness and giddiness: Secondary | ICD-10-CM | POA: Insufficient documentation

## 2014-03-27 DIAGNOSIS — N281 Cyst of kidney, acquired: Secondary | ICD-10-CM | POA: Diagnosis not present

## 2014-03-27 DIAGNOSIS — N401 Enlarged prostate with lower urinary tract symptoms: Secondary | ICD-10-CM | POA: Diagnosis not present

## 2014-03-27 LAB — PROTIME-INR
INR: 2.11 — ABNORMAL HIGH (ref 0.00–1.49)
Prothrombin Time: 23 seconds — ABNORMAL HIGH (ref 11.6–15.2)

## 2014-03-27 LAB — TROPONIN I: Troponin I: <0.3 ng/mL (ref <0.30)

## 2014-03-27 LAB — APTT: aPTT: 39 seconds — ABNORMAL HIGH (ref 24–37)

## 2014-03-27 NOTE — Progress Notes (Signed)
Carotid duplex complete 

## 2014-03-27 NOTE — Telephone Encounter (Signed)
Spoke with patient while in ER for recurrence of his atrial fibrillation. As per the instructions from Dr. Percival Spanish, he presented with palpitations and noted to be in afib with rates in the 105s. Appropriately anti-coagulated on coumadin. Given 300 mg flecainide in hopes of cardioverting him but to no avail. Afib in the 70s. Minimal to no symptoms. Pt preferred to be discharged and follow up with Dr. Percival Spanish to consider tikosyn loading (as per last note) at another time in hopes that he would spontaneously convert on his own. FYI to Dr. Percival Spanish.

## 2014-03-27 NOTE — Discharge Instructions (Signed)
Atrial Fibrillation  Atrial fibrillation is a type of irregular heart rhythm (arrhythmia). During atrial fibrillation, the upper chambers of the heart (atria) quiver continuously in a chaotic pattern. This causes an irregular and often rapid heart rate.   Atrial fibrillation is the result of the heart becoming overloaded with disorganized signals that tell it to beat. These signals are normally released one at a time by a part of the right atrium called the sinoatrial node. They then travel from the atria to the lower chambers of the heart (ventricles), causing the atria and ventricles to contract and pump blood as they pass. In atrial fibrillation, parts of the atria outside of the sinoatrial node also release these signals. This results in two problems. First, the atria receive so many signals that they do not have time to fully contract. Second, the ventricles, which can only receive one signal at a time, beat irregularly and out of rhythm with the atria.   There are three types of atrial fibrillation:    Paroxysmal Paroxysmal atrial fibrillation starts suddenly and stops on its own within a week.    Persistent Persistent atrial fibrillation lasts for more than a week. It may stop on its own or with treatment.    Permanent Permanent atrial fibrillation does not go away. Episodes of atrial fibrillation may lead to permanent atrial fibrillation.   Atrial fibrillation can prevent your heart from pumping blood normally. It increases your risk of stroke and can lead to heart failure.   CAUSES    Heart conditions, including a heart attack, heart failure, coronary artery disease, and heart valve conditions.    Inflammation of the sac that surrounds the heart (pericarditis).    Blockage of an artery in the lungs (pulmonary embolism).    Pneumonia or other infections.    Chronic lung disease.    Thyroid problems, especially if the thyroid is overactive (hyperthyroidism).    Caffeine, excessive alcohol  use, and use of some illegal drugs.    Use of some medications, including certain decongestants and diet pills.    Heart surgery.    Birth defects.   Sometimes, no cause can be found. When this happens, the atrial fibrillation is called lone atrial fibrillation. The risk of complications from atrial fibrillation increases if you have lone atrial fibrillation and you are age 60 years or older.  RISK FACTORS   Heart failure.   Coronary artery disease   Diabetes mellitus.    High blood pressure (hypertension).    Obesity.    Other arrhythmias.    Increased age.  SYMPTOMS    A feeling that your heart is beating rapidly or irregularly.    A feeling of discomfort or pain in your chest.    Shortness of breath.    Sudden lightheadedness or weakness.    Getting tired easily when exercising.    Urinating more often than normal (mainly when atrial fibrillation first begins).   In paroxysmal atrial fibrillation, symptoms may start and suddenly stop.  DIAGNOSIS   Your caregiver may be able to detect atrial fibrillation when taking your pulse. Usually, testing is needed to diagnosis atrial fibrillation. Tests may include:    Electrocardiography. During this test, the electrical impulses of your heart are recorded while you are lying down.    Echocardiography. During echocardiography, sound waves are used to evaluate how blood flows through your heart.    Stress test. There is more than one type of stress test. If a stress test is   needed, ask your caregiver about which type is best for you.    Chest X-ray exam.    Blood tests.    Computed tomography (CT).   TREATMENT    Treating any underlying conditions. For example, if you have an overactive thyroid, treating the condition may correct atrial fibrillation.    Medication. Medications may be given to control a rapid heart rate or to prevent blood clots, heart failure, or a stroke.    Procedure to correct the rhythm of the  heart:   Electrical cardioversion. During electrical cardioversion, a controlled, low-energy shock is delivered to the heart through your skin. If you have chest pain, very low pressure blood pressure, or sudden heart failure, this procedure may need to be done as an emergency.   Catheter ablation. During this procedure, heart tissues that send the signals that cause atrial fibrillation are destroyed.   Maze or minimaze procedure. During this surgery, thin lines of heart tissue that carry the abnormal signals are destroyed. The maze procedure is an open-heart surgery. The minimaze procedure is a minimally invasive surgery. This means that small cuts are made to access the heart instead of a large opening.   Pulmonary venous isolation. During this surgery, tissue around the veins that carry blood from the lungs (pulmonary veins) is destroyed. This tissue is thought to carry the abnormal signals.  HOME CARE INSTRUCTIONS    Take medications as directed by your caregiver.   Only take medications that your caregiver approves. Some medications can make atrial fibrillation worse or recur.   If blood thinners were prescribed by your caregiver, take them exactly as directed. Too much can cause bleeding. Too little and you will not have the needed protection against stroke and other problems.   Perform blood tests at home if directed by your caregiver.   Perform blood tests exactly as directed.    Quit smoking if you smoke.    Do not drink alcohol.    Do not drink caffeinated beverages such as coffee, soda, and some teas. You may drink decaffeinated coffee, soda, or tea.    Maintain a healthy weight. Do not use diet pills unless your caregiver approves. They may make heart problems worse.    Follow diet instructions as directed by your caregiver.    Exercise regularly as directed by your caregiver.    Keep all follow-up appointments.  PREVENTION   The following substances can cause atrial fibrillation  to recur:    Caffeinated beverages.    Alcohol.    Certain medications, especially those used for breathing problems.    Certain herbs and herbal medications, such as those containing ephedra or ginseng.   Illegal drugs such as cocaine and amphetamines.  Sometimes medications are given to prevent atrial fibrillation from recurring. Proper treatment of any underlying condition is also important in helping prevent recurrence.   SEEK MEDICAL CARE IF:   You notice a change in the rate, rhythm, or strength of your heartbeat.    You suddenly begin urinating more frequently.    You tire more easily when exerting yourself or exercising.   SEEK IMMEDIATE MEDICAL CARE IF:    You develop chest pain, abdominal pain, sweating, or weakness.   You feel sick to your stomach (nauseous).   You develop shortness of breath.   You suddenly develop swollen feet and ankles.   You feel dizzy.   You face or limbs feel numb or weak.   There is a change in your   vision or speech.  MAKE SURE YOU:    Understand these instructions.   Will watch your condition.   Will get help right away if you are not doing well or get worse.  Document Released: 10/25/2005 Document Revised: 02/19/2013 Document Reviewed: 12/05/2012  [employer] Patient Information 2014 [employer].

## 2014-03-27 NOTE — ED Notes (Signed)
MD at bedside. 

## 2014-03-28 ENCOUNTER — Telehealth: Payer: Self-pay | Admitting: Cardiology

## 2014-03-28 NOTE — Telephone Encounter (Signed)
Walk In pt Form " Letter Left" will give to [facility] Friday

## 2014-04-23 ENCOUNTER — Ambulatory Visit (INDEPENDENT_AMBULATORY_CARE_PROVIDER_SITE_OTHER): Payer: Medicare Other | Admitting: Pharmacist Clinician (PhC)/ Clinical Pharmacy Specialist

## 2014-04-23 DIAGNOSIS — Z5181 Encounter for therapeutic drug level monitoring: Secondary | ICD-10-CM

## 2014-04-23 DIAGNOSIS — Z7901 Long term (current) use of anticoagulants: Secondary | ICD-10-CM

## 2014-04-23 DIAGNOSIS — I4891 Unspecified atrial fibrillation: Secondary | ICD-10-CM

## 2014-04-23 LAB — POCT INR: INR: 3.7

## 2014-05-07 ENCOUNTER — Ambulatory Visit (INDEPENDENT_AMBULATORY_CARE_PROVIDER_SITE_OTHER): Payer: Medicare Other | Admitting: *Deleted

## 2014-05-07 DIAGNOSIS — Z7901 Long term (current) use of anticoagulants: Secondary | ICD-10-CM

## 2014-05-07 DIAGNOSIS — I4891 Unspecified atrial fibrillation: Secondary | ICD-10-CM

## 2014-05-07 DIAGNOSIS — Z5181 Encounter for therapeutic drug level monitoring: Secondary | ICD-10-CM | POA: Diagnosis not present

## 2014-05-07 LAB — POCT INR: INR: 2.1

## 2014-05-21 ENCOUNTER — Telehealth: Payer: Self-pay | Admitting: Cardiology

## 2014-05-21 ENCOUNTER — Ambulatory Visit (INDEPENDENT_AMBULATORY_CARE_PROVIDER_SITE_OTHER): Payer: Medicare Other | Admitting: *Deleted

## 2014-05-21 VITALS — BP 110/60 | HR 57 | Ht 71.0 in | Wt 207.7 lb

## 2014-05-21 DIAGNOSIS — I4891 Unspecified atrial fibrillation: Secondary | ICD-10-CM | POA: Diagnosis not present

## 2014-05-21 DIAGNOSIS — I480 Paroxysmal atrial fibrillation: Secondary | ICD-10-CM

## 2014-05-21 NOTE — Telephone Encounter (Signed)
RN NOTIFIED PATIENT. TO COME TO OFFICE FOR EKG. WILL HAVE DR [facility] REVIEW. PATIENT STATES IT WILL BE ABOUT 30 MINS.

## 2014-05-21 NOTE — Telephone Encounter (Signed)
RN called patient . Patient states he has been in AFIB since 7 pm last night 05/20/14. Patient states no discomfort, fill a little weak but no other symptoms.  TODAY'S BLOOD PRESSURE 120 /70 PULSE 82  Patient does not want to go to the hospital. He states he would like to have a prescription call in to use if he is still in AFIB or to keep an use when it returns. RN informed patient will defer to DR Hochrein.

## 2014-05-21 NOTE — Patient Instructions (Signed)
Continue with current medication  If afib reoCcur  CALL OFFICE per Dr Percival Spanish.

## 2014-05-21 NOTE — Telephone Encounter (Signed)
Pt called, said he still waiting to hear what Dr Percival Spanish said.

## 2014-05-21 NOTE — Progress Notes (Signed)
Patient for EKG , per Dr Percival Spanish.  vital signs done EKG done , Dr Percival Spanish reviewed   Per Dr Percival Spanish,  Do not change any medication at present time. If patient, develops AFIB ,again - EKG IS NEEDED before concerting Flecainde dosing per Dr Percival Spanish.  Patient verbalized understanding.

## 2014-05-21 NOTE — Telephone Encounter (Signed)
I would like the patient to have an EKG to verify atrial fib.  If it is this I would call in 300 mg of flecainide for a pill in pocket cardioversion approach.  He has had this successfully used in the ED.  However, he has also had PACs at times and this might not be atrial fib.

## 2014-05-21 NOTE — Telephone Encounter (Signed)
Pt is in atrial fib,been in for about 13 hts. Wants to know if he wants to give him a prescription>please call asap.

## 2014-05-23 NOTE — Telephone Encounter (Signed)
Close Encounter 

## 2014-06-03 DIAGNOSIS — Z961 Presence of intraocular lens: Secondary | ICD-10-CM | POA: Diagnosis not present

## 2014-06-03 DIAGNOSIS — H409 Unspecified glaucoma: Secondary | ICD-10-CM | POA: Diagnosis not present

## 2014-06-03 DIAGNOSIS — H4011X0 Primary open-angle glaucoma, stage unspecified: Secondary | ICD-10-CM | POA: Diagnosis not present

## 2014-06-04 ENCOUNTER — Ambulatory Visit (INDEPENDENT_AMBULATORY_CARE_PROVIDER_SITE_OTHER): Payer: Medicare Other

## 2014-06-04 DIAGNOSIS — Z5181 Encounter for therapeutic drug level monitoring: Secondary | ICD-10-CM | POA: Diagnosis not present

## 2014-06-04 DIAGNOSIS — Z7901 Long term (current) use of anticoagulants: Secondary | ICD-10-CM | POA: Diagnosis not present

## 2014-06-04 DIAGNOSIS — I4891 Unspecified atrial fibrillation: Secondary | ICD-10-CM | POA: Diagnosis not present

## 2014-06-04 LAB — POCT INR: INR: 2.5

## 2014-06-11 ENCOUNTER — Telehealth: Payer: Self-pay | Admitting: Cardiology

## 2014-06-11 NOTE — Telephone Encounter (Signed)
New Prob    Pt is currently in A-Fib onset 8:30 AM. Wife if concerned and requesting to speak to a nurse. Please call.

## 2014-06-11 NOTE — Telephone Encounter (Signed)
Pt called and wanted [place] to know he was in a fibb for a short period of time but went back out of it before he could get to the hospital; just wanted Dr.Hochrein to know

## 2014-06-18 ENCOUNTER — Other Ambulatory Visit: Payer: Self-pay | Admitting: Pharmacist Clinician (PhC)/ Clinical Pharmacy Specialist

## 2014-06-18 MED ORDER — WARFARIN SODIUM 5 MG PO TABS: ORAL_TABLET | ORAL | Status: DC

## 2014-06-26 DIAGNOSIS — Z85820 Personal history of malignant melanoma of skin: Secondary | ICD-10-CM | POA: Diagnosis not present

## 2014-06-26 DIAGNOSIS — L570 Actinic keratosis: Secondary | ICD-10-CM | POA: Diagnosis not present

## 2014-06-26 DIAGNOSIS — D235 Other benign neoplasm of skin of trunk: Secondary | ICD-10-CM | POA: Diagnosis not present

## 2014-06-26 DIAGNOSIS — L819 Disorder of pigmentation, unspecified: Secondary | ICD-10-CM | POA: Diagnosis not present

## 2014-07-02 ENCOUNTER — Ambulatory Visit (INDEPENDENT_AMBULATORY_CARE_PROVIDER_SITE_OTHER): Payer: Medicare Other | Admitting: Pharmacist

## 2014-07-02 DIAGNOSIS — Z7901 Long term (current) use of anticoagulants: Secondary | ICD-10-CM

## 2014-07-02 DIAGNOSIS — Z5181 Encounter for therapeutic drug level monitoring: Secondary | ICD-10-CM

## 2014-07-02 DIAGNOSIS — I4891 Unspecified atrial fibrillation: Secondary | ICD-10-CM | POA: Diagnosis not present

## 2014-07-02 LAB — POCT INR: INR: 2.4

## 2014-07-18 ENCOUNTER — Telehealth: Payer: Self-pay | Admitting: Cardiology

## 2014-07-18 NOTE — Telephone Encounter (Signed)
Pt called in stating that he is having irregular heartbeats and is very concerned. 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 Please call  Thanks

## 2014-07-18 NOTE — Telephone Encounter (Signed)
I talked to pt. And he is to be added to the schedule in the morning 07/19/2014 at 8:00 am

## 2014-07-19 ENCOUNTER — Encounter: Payer: Self-pay | Admitting: Cardiology

## 2014-07-19 ENCOUNTER — Other Ambulatory Visit: Payer: Self-pay | Admitting: *Deleted

## 2014-07-19 ENCOUNTER — Ambulatory Visit (INDEPENDENT_AMBULATORY_CARE_PROVIDER_SITE_OTHER): Payer: Medicare Other | Admitting: Cardiology

## 2014-07-19 VITALS — BP 150/70 | HR 52 | Ht 71.0 in | Wt 210.2 lb

## 2014-07-19 DIAGNOSIS — I495 Sick sinus syndrome: Secondary | ICD-10-CM

## 2014-07-19 DIAGNOSIS — I10 Essential (primary) hypertension: Secondary | ICD-10-CM

## 2014-07-19 DIAGNOSIS — I4891 Unspecified atrial fibrillation: Secondary | ICD-10-CM

## 2014-07-19 MED ORDER — FLECAINIDE ACETATE 50 MG PO TABS: 50.0000 mg | ORAL_TABLET | Freq: Two times a day (BID) | ORAL | Status: DC

## 2014-07-19 NOTE — Telephone Encounter (Signed)
Pt called in stating that he would like his prescription for Flecainide to come in 100 mg instead of 50mg . He stated that it is cheaper. He also stated that the new prescription can be mailed to his home and he will have the [place] to fill it. Please call  Thanks

## 2014-07-19 NOTE — Progress Notes (Signed)
HPI The patient presents for evaluation of atrial fibrillation. He says he's had more sustained episodes of this since I last saw him and also more brief episodes that seemed to be self-limited. He feels the palpitations. The patient denies any new symptoms such as chest discomfort, neck or arm discomfort. There has been no new shortness of breath, PND or orthopnea. There has presyncope or syncope.    Allergies  Allergen Reactions  . Sulfa Antibiotics Other (See Comments)    Bad problem on groin area  . Sulfamethoxazole     REACTION: rash    Current Outpatient Prescriptions  Medication Sig Dispense Refill  . acetaminophen (TYLENOL) 325 MG tablet Take 325-650 mg by mouth daily.       . cetirizine (ZYRTEC) 10 MG tablet Take 10 mg by mouth every morning.       . latanoprost (XALATAN) 0.005 % ophthalmic solution Place 1 drop into both eyes at bedtime.       Marland Kitchen lisinopril (PRINIVIL,ZESTRIL) 10 MG tablet Take 10 mg by mouth at bedtime.       Marland Kitchen loratadine (CLARITIN) 10 MG tablet Take 10 mg by mouth at bedtime.       . metoprolol tartrate (LOPRESSOR) 25 MG tablet Take 25 mg by mouth 2 (two) times daily.      . mometasone (NASONEX) 50 MCG/ACT nasal spray Place 2 sprays into both nostrils daily as needed (allergies).       . Multiple Vitamins-Minerals (CENTRUM SILVER PO) Take 1 tablet by mouth every morning.       Marland Kitchen omeprazole (PRILOSEC) 20 MG capsule Take 20 mg by mouth 2 (two) times daily.       . potassium chloride (KLOR-CON) 10 MEQ CR tablet Take 10 mEq by mouth every morning.       . Psyllium (METAMUCIL PO) Take 30 mLs by mouth 2 (two) times daily.       . simvastatin (ZOCOR) 40 MG tablet Take 40 mg by mouth at bedtime.        . tamsulosin (FLOMAX) 0.4 MG CAPS Take 0.4 mg by mouth every evening.       . warfarin (COUMADIN) 5 MG tablet Take 1 tablet by mouth daily or as directed by coumadin clinic  30 tablet  5   No current facility-administered medications for this visit.    Past  Medical History  Diagnosis Date  . Allergic rhinitis   . Hyperlipidemia   . HTN (hypertension)   . Low back pain   . Paroxysmal atrial fibrillation     sees Dr. Percival Spanish   . GERD (gastroesophageal reflux disease)   . Glaucoma   . Hx of colonic polyps     tubular adenoma   . Nephrolithiasis   . Hemorrhoids     internal  . Pancolonic diverticulosis   . Cancer     FACIAL MELONOMA  . Cataract     REMOVED    Past Surgical History  Procedure Laterality Date  . Cholecystectomy    . Vasectomy    . Right inguinal hernia repair    . Refractive surgery    . Ruptured blood vessel in the bladder    . Removed 2 stones from left ureter    . Blood vessel repair in bladder    . Colonoscopy  06-29-12    per Dr. [name], adenomatous polyps, repeat in 3 yrs     ROS:  As stated in the HPI and negative for all other  systems.   PHYSICAL EXAM BP 150/70  Pulse 52  Ht 5\' 11"  (1.803 m)  Wt 210 lb 3.2 oz (95.346 kg)  BMI 29.33 kg/m2 GENERAL:  Well appearing NECK:  No jugular venous distention, waveform within normal limits, carotid upstroke brisk and symmetric, no bruits, no thyromegaly LUNGS:  Clear to auscultation bilaterally BACK:  No CVA tenderness CHEST:  Unremarkable HEART:  PMI not displaced or sustained,S1 and S2 within normal limits, no S3, no clicks, no rubs, no murmurs, irregular ABD:  Flat, positive bowel sounds normal in frequency in pitch, no bruits, no rebound, no guarding, no midline pulsatile mass, no hepatomegaly, no splenomegaly EXT:  2 plus pulses throughout, no edema, no cyanosis no clubbing  ASSESSMENT AND PLAN   ATRIAL FIBRILLATION -  Given the fact he is having increasing palpitations with clear bouts of fibrillation is now time to start him on a maintenance dose of flecainide. I will start with 50 mg twice daily and increase after a few days to 100 mg twice daily. If this is his maintenance dose will bring him back for a POET (Plain Old Exercise Treadmill) and a  trough level .   I will check a baseline echo as well as it has been two years.   HYPERTENSION -  The blood pressure is not at target. However, his BP is usually OK.  No change in medications is indicated. We will continue with therapeutic lifestyle changes (TLC).

## 2014-07-19 NOTE — Patient Instructions (Signed)
Your physician recommends that you schedule a follow-up appointment in:  One month  Take Flecainide as directed

## 2014-07-23 ENCOUNTER — Ambulatory Visit (HOSPITAL_COMMUNITY)
Admission: RE | Admit: 2014-07-23 | Discharge: 2014-07-23 | Disposition: A | Discharge status: Home / Self Care | Payer: Medicare Other | Source: Ambulatory Visit | Attending: Internal Medicine | Admitting: Internal Medicine

## 2014-07-23 ENCOUNTER — Telehealth: Payer: Self-pay

## 2014-07-23 DIAGNOSIS — I059 Rheumatic mitral valve disease, unspecified: Secondary | ICD-10-CM | POA: Diagnosis not present

## 2014-07-23 DIAGNOSIS — E785 Hyperlipidemia, unspecified: Secondary | ICD-10-CM | POA: Diagnosis not present

## 2014-07-23 DIAGNOSIS — I4891 Unspecified atrial fibrillation: Secondary | ICD-10-CM

## 2014-07-23 DIAGNOSIS — I10 Essential (primary) hypertension: Secondary | ICD-10-CM | POA: Diagnosis not present

## 2014-07-23 DIAGNOSIS — I495 Sick sinus syndrome: Secondary | ICD-10-CM

## 2014-07-23 MED ORDER — FLECAINIDE ACETATE 100 MG PO TABS: 100.0000 mg | ORAL_TABLET | Freq: Two times a day (BID) | ORAL | Status: DC

## 2014-07-23 NOTE — Telephone Encounter (Signed)
Patient walked in office stated he needed a written prescription for Flecainide 100 mg twice a day.Stated he will take prescription to the [place].Prescription gave to [facility] Dr.Hochrein's nurse for a signature.Patient requested mail prescription to him.

## 2014-07-23 NOTE — Progress Notes (Signed)
2D Echo Performed 07/23/2014    Marygrace Drought, RCS

## 2014-07-24 ENCOUNTER — Telehealth: Payer: Self-pay | Admitting: Cardiology

## 2014-07-24 NOTE — Telephone Encounter (Signed)
Patient notified of echocardiogram results.

## 2014-07-24 NOTE — Telephone Encounter (Signed)
Patient would like Echo results---had done yesterday.

## 2014-08-13 ENCOUNTER — Ambulatory Visit (INDEPENDENT_AMBULATORY_CARE_PROVIDER_SITE_OTHER): Payer: Medicare Other | Admitting: *Deleted

## 2014-08-13 DIAGNOSIS — I4891 Unspecified atrial fibrillation: Secondary | ICD-10-CM

## 2014-08-13 DIAGNOSIS — Z5181 Encounter for therapeutic drug level monitoring: Secondary | ICD-10-CM | POA: Diagnosis not present

## 2014-08-13 DIAGNOSIS — Z7901 Long term (current) use of anticoagulants: Secondary | ICD-10-CM

## 2014-08-13 LAB — POCT INR: INR: 2.9

## 2014-08-16 ENCOUNTER — Ambulatory Visit: Payer: Medicare Other | Admitting: Cardiovascular Disease

## 2014-08-23 ENCOUNTER — Ambulatory Visit (INDEPENDENT_AMBULATORY_CARE_PROVIDER_SITE_OTHER): Payer: Medicare Other | Admitting: Cardiology

## 2014-08-23 ENCOUNTER — Encounter: Payer: Self-pay | Admitting: Family Medicine

## 2014-08-23 ENCOUNTER — Ambulatory Visit (INDEPENDENT_AMBULATORY_CARE_PROVIDER_SITE_OTHER): Payer: Medicare Other | Admitting: Family Medicine

## 2014-08-23 ENCOUNTER — Encounter: Payer: Self-pay | Admitting: Cardiology

## 2014-08-23 ENCOUNTER — Other Ambulatory Visit: Payer: Self-pay

## 2014-08-23 VITALS — BP 112/62 | HR 53 | Ht 70.0 in | Wt 205.0 lb

## 2014-08-23 VITALS — BP 132/68 | HR 49 | Temp 98.6°F | Ht 71.0 in | Wt 204.0 lb

## 2014-08-23 DIAGNOSIS — N401 Enlarged prostate with lower urinary tract symptoms: Secondary | ICD-10-CM | POA: Diagnosis not present

## 2014-08-23 DIAGNOSIS — I495 Sick sinus syndrome: Secondary | ICD-10-CM

## 2014-08-23 DIAGNOSIS — I482 Chronic atrial fibrillation, unspecified: Secondary | ICD-10-CM

## 2014-08-23 DIAGNOSIS — I10 Essential (primary) hypertension: Secondary | ICD-10-CM | POA: Diagnosis not present

## 2014-08-23 DIAGNOSIS — Z86010 Personal history of colonic polyps: Secondary | ICD-10-CM | POA: Diagnosis not present

## 2014-08-23 DIAGNOSIS — Z23 Encounter for immunization: Secondary | ICD-10-CM

## 2014-08-23 DIAGNOSIS — N138 Other obstructive and reflux uropathy: Secondary | ICD-10-CM

## 2014-08-23 LAB — POCT URINALYSIS DIPSTICK
Bilirubin, UA: NEGATIVE
Glucose, UA: NEGATIVE
KETONES UA: NEGATIVE
Nitrite, UA: NEGATIVE
Protein, UA: NEGATIVE
SPEC GRAV UA: 1.015
UROBILINOGEN UA: 0.2
pH, UA: 5.5

## 2014-08-23 LAB — CBC WITH DIFFERENTIAL/PLATELET
BASOS PCT: 0.3 % (ref 0.0–3.0)
Basophils Absolute: 0 10*3/uL (ref 0.0–0.1)
EOS PCT: 2.7 % (ref 0.0–5.0)
Eosinophils Absolute: 0.2 10*3/uL (ref 0.0–0.7)
HEMATOCRIT: 41.7 % (ref 39.0–52.0)
HEMOGLOBIN: 13.7 g/dL (ref 13.0–17.0)
LYMPHS ABS: 2.4 10*3/uL (ref 0.7–4.0)
Lymphocytes Relative: 29.5 % (ref 12.0–46.0)
MCHC: 32.8 g/dL (ref 30.0–36.0)
MCV: 96.1 fl (ref 78.0–100.0)
MONO ABS: 0.7 10*3/uL (ref 0.1–1.0)
Monocytes Relative: 9.2 % (ref 3.0–12.0)
Neutro Abs: 4.7 10*3/uL (ref 1.4–7.7)
Neutrophils Relative %: 58.3 % (ref 43.0–77.0)
PLATELETS: 175 10*3/uL (ref 150.0–400.0)
RBC: 4.34 Mil/uL (ref 4.22–5.81)
RDW: 13.6 % (ref 11.5–15.5)
WBC: 8.1 10*3/uL (ref 4.0–10.5)

## 2014-08-23 LAB — TSH: TSH: 1.12 u[IU]/mL (ref 0.35–4.50)

## 2014-08-23 LAB — HEPATIC FUNCTION PANEL
ALK PHOS: 51 U/L (ref 39–117)
ALT: 22 U/L (ref 0–53)
AST: 23 U/L (ref 0–37)
Albumin: 4.2 g/dL (ref 3.5–5.2)
BILIRUBIN DIRECT: 0.2 mg/dL (ref 0.0–0.3)
BILIRUBIN TOTAL: 1.3 mg/dL — AB (ref 0.2–1.2)
TOTAL PROTEIN: 7.5 g/dL (ref 6.0–8.3)

## 2014-08-23 LAB — BASIC METABOLIC PANEL
BUN: 28 mg/dL — ABNORMAL HIGH (ref 6–23)
CALCIUM: 9.7 mg/dL (ref 8.4–10.5)
CO2: 31 mEq/L (ref 19–32)
Chloride: 100 mEq/L (ref 96–112)
Creatinine, Ser: 1.3 mg/dL (ref 0.4–1.5)
GFR: 56.51 mL/min — AB (ref >60.00)
GLUCOSE: 98 mg/dL (ref 70–99)
POTASSIUM: 4.5 meq/L (ref 3.5–5.1)
SODIUM: 138 meq/L (ref 135–145)

## 2014-08-23 LAB — LIPID PANEL
Cholesterol: 129 mg/dL (ref 0–200)
HDL: 33.5 mg/dL — ABNORMAL LOW (ref >39.00)
LDL Cholesterol: 74 mg/dL (ref 0–99)
NONHDL: 95.5
Total CHOL/HDL Ratio: 4
Triglycerides: 109 mg/dL (ref 0.0–149.0)
VLDL: 21.8 mg/dL (ref 0.0–40.0)

## 2014-08-23 LAB — PSA: PSA: 1.42 ng/mL (ref 0.10–4.00)

## 2014-08-23 NOTE — Progress Notes (Signed)
HPI The patient presents for evaluation of atrial fibrillation. At the last visit he was started on flecainide.  He did have an echocardiogram which was essentially unremarkable.  We did try a pill in pocket approach previously and he received this initially in the emergency room. He didn't convert him but he soon converted to atrial fibrillation. Because he was having increasing episodes we started him on flecainide as above. However, he has not tolerated this. He says he feels poorly. He is weak. His heart rate has been low. He's had decreased exercise tolerance and was trying to walk down the beach and felt like she was staggering. He denies any chest pressure, neck or arm discomfort. He has had at least one more episode of symptomatic atrial fibrillation.  Allergies  Allergen Reactions  . Sulfa Antibiotics Other (See Comments)    Bad problem on groin area  . Sulfamethoxazole     REACTION: rash    Current Outpatient Prescriptions  Medication Sig Dispense Refill  . acetaminophen (TYLENOL) 325 MG tablet Take 325-650 mg by mouth daily.       . cetirizine (ZYRTEC) 10 MG tablet Take 10 mg by mouth every morning.       . latanoprost (XALATAN) 0.005 % ophthalmic solution Place 1 drop into both eyes at bedtime.       Marland Kitchen lisinopril (PRINIVIL,ZESTRIL) 10 MG tablet Take 10 mg by mouth at bedtime.       Marland Kitchen loratadine (CLARITIN) 10 MG tablet Take 10 mg by mouth at bedtime.       . metoprolol tartrate (LOPRESSOR) 25 MG tablet Take 25 mg by mouth 2 (two) times daily.      . mometasone (NASONEX) 50 MCG/ACT nasal spray Place 2 sprays into both nostrils daily as needed (allergies).       . Multiple Vitamins-Minerals (CENTRUM SILVER PO) Take 1 tablet by mouth every morning.       Marland Kitchen omeprazole (PRILOSEC) 20 MG capsule Take 20 mg by mouth 2 (two) times daily.       . potassium chloride (KLOR-CON) 10 MEQ CR tablet Take 10 mEq by mouth every morning.       . Psyllium (METAMUCIL PO) Take 30 mLs by mouth 2  (two) times daily.       . simvastatin (ZOCOR) 40 MG tablet Take 40 mg by mouth at bedtime.        . tamsulosin (FLOMAX) 0.4 MG CAPS Take 0.4 mg by mouth every evening.       . warfarin (COUMADIN) 5 MG tablet Take 1 tablet by mouth daily or as directed by coumadin clinic  30 tablet  5  . flecainide (TAMBOCOR) 100 MG tablet Take 1 tablet (100 mg total) by mouth 2 (two) times daily.  180 tablet  3   No current facility-administered medications for this visit.    Past Medical History  Diagnosis Date  . Allergic rhinitis   . Hyperlipidemia   . HTN (hypertension)   . Low back pain   . Paroxysmal atrial fibrillation     sees Dr. Percival Spanish   . GERD (gastroesophageal reflux disease)   . Glaucoma   . Hx of colonic polyps     tubular adenoma   . Nephrolithiasis   . Hemorrhoids     internal  . Pancolonic diverticulosis   . Cancer     FACIAL MELONOMA  . Cataract     REMOVED    Past Surgical History  Procedure Laterality Date  .  Cholecystectomy    . Vasectomy    . Right inguinal hernia repair    . Refractive surgery    . Ruptured blood vessel in the bladder    . Removed 2 stones from left ureter    . Blood vessel repair in bladder    . Colonoscopy  06-29-12    per Dr. [name], adenomatous polyps, repeat in 3 yrs     ROS:  As stated in the HPI and negative for all other systems.   PHYSICAL EXAM BP 112/62  Ht 5\' 10"  (1.778 m)  Wt 205 lb (92.987 kg)  BMI 29.41 kg/m2 GENERAL:  Well appearing NECK:  No jugular venous distention, waveform within normal limits, carotid upstroke brisk and symmetric, no bruits, no thyromegaly LUNGS:  Clear to auscultation bilaterally BACK:  No CVA tenderness CHEST:  Unremarkable HEART:  PMI not displaced or sustained,S1 and S2 within normal limits, no S3, no clicks, no rubs, no murmurs, irregular ABD:  Flat, positive bowel sounds normal in frequency in pitch, no bruits, no rebound, no guarding, no midline pulsatile mass, no hepatomegaly, no  splenomegaly EXT:  2 plus pulses throughout, no edema, no cyanosis no clubbing  EKG:  Sinus bradycardia, rate 53, axis within normal limits, intervals within normal limits, poor anterior R wave progression.  08/23/2014  ASSESSMENT AND PLAN   ATRIAL FIBRILLATION -  The patient has now failed flecainide. He would not be a candidate for sotalol or amiodarone with his bradycardia. We could consider dofetilide or Multaq.  However, given the fact that are options are dwindling and he might need to be considered for ablation I will refer to him Dr. [name].  He will stop the flecainide. He continues to have symptomatic bradycardia he will need to stop his beta blocker.  HYPERTENSION -  The blood pressure is not at target. However, his BP is usually OK.  No change in medications is indicated. We will continue with therapeutic lifestyle changes (TLC).

## 2014-08-23 NOTE — Progress Notes (Signed)
Pre visit review using our clinic review tool, if applicable. No additional management support is needed unless otherwise documented below in the visit note. 

## 2014-08-23 NOTE — Patient Instructions (Signed)
Stop taking Flecainide  We will set up an appt with Dr. [name] to talk about you options about atrial fibb.

## 2014-08-23 NOTE — Addendum Note (Signed)
Addended by: Aggie Hacker A on: 08/23/2014 11:20 AM   Modules accepted: Orders

## 2014-08-23 NOTE — Progress Notes (Signed)
   Subjective:    Patient ID: [name] [name], male    DOB: 01/14/1929, 80 y.o.   MRN: [id number]  HPI 80 yr old male for a cpx. He has been doing quite well until the past few weeks. He had developed some episodes of fibrillation so Dr. Percival Spanish started him on Flecanide. He started out on 50 mg bid and then titrated up to 100 mg bid. However during this time his heart rate has decreased to the 40s and 50s, he has felt weak, and he gets very SOB with walking short distances. The past few days he has cut this back to 50 mg bid on his own but he does not feel much better. He sees Dr. Percival Spanish about this later today. He still sees Dr. Junious Silk regularly as well as several doctors in the [place] system. Review of Systems  Constitutional: Negative.   HENT: Negative.   Eyes: Negative.   Respiratory: Positive for shortness of breath. Negative for apnea, cough, choking, chest tightness, wheezing and stridor.   Cardiovascular: Positive for palpitations. Negative for chest pain and leg swelling.  Gastrointestinal: Negative.   Genitourinary: Negative.   Musculoskeletal: Negative.   Skin: Negative.   Neurological: Positive for light-headedness. Negative for dizziness, tremors, seizures, syncope, facial asymmetry, speech difficulty, weakness, numbness and headaches.  Psychiatric/Behavioral: Negative.        Objective:   Physical Exam  Constitutional: He is oriented to person, place, and time. He appears well-developed and well-nourished. No distress.  HENT:  Head: Normocephalic and atraumatic.  Right Ear: External ear normal.  Left Ear: External ear normal.  Nose: Nose normal.  Mouth/Throat: Oropharynx is clear and moist. No oropharyngeal exudate.  Eyes: Conjunctivae and EOM are normal. Pupils are equal, round, and reactive to light. Right eye exhibits no discharge. Left eye exhibits no discharge. No scleral icterus.  Neck: Neck supple. No JVD present. No tracheal deviation present. No thyromegaly  present.  Cardiovascular: Normal rate, regular rhythm, normal heart sounds and intact distal pulses.  Exam reveals no gallop and no friction rub.   No murmur heard. Pulmonary/Chest: Effort normal and breath sounds normal. No respiratory distress. He has no wheezes. He has no rales. He exhibits no tenderness.  Abdominal: Soft. Bowel sounds are normal. He exhibits no distension and no mass. There is no tenderness. There is no rebound and no guarding.  Genitourinary: Rectum normal, prostate normal and penis normal. Guaiac negative stool. No penile tenderness.  Musculoskeletal: Normal range of motion. He exhibits no edema and no tenderness.  Lymphadenopathy:    He has no cervical adenopathy.  Neurological: He is alert and oriented to person, place, and time. He has normal reflexes. No cranial nerve deficit. He exhibits normal muscle tone. Coordination normal.  Skin: Skin is warm and dry. No rash noted. He is not diaphoretic. No erythema. No pallor.  Psychiatric: He has a normal mood and affect. His behavior is normal. Judgment and thought content normal.          Assessment & Plan:  Well exam. Get fasting labs. It seems he cannot tolerate Flecanide but he will discuss this with Dr. Percival Spanish later today.

## 2014-08-26 ENCOUNTER — Telehealth: Payer: Self-pay | Admitting: Family Medicine

## 2014-08-26 NOTE — Telephone Encounter (Signed)
emmi emailed °

## 2014-09-02 DIAGNOSIS — H4011X2 Primary open-angle glaucoma, moderate stage: Secondary | ICD-10-CM | POA: Diagnosis not present

## 2014-09-09 ENCOUNTER — Ambulatory Visit: Payer: Medicare Other | Admitting: Cardiology

## 2014-09-16 ENCOUNTER — Encounter: Payer: Self-pay | Admitting: Family Medicine

## 2014-09-16 ENCOUNTER — Telehealth: Payer: Self-pay | Admitting: Family Medicine

## 2014-09-16 NOTE — Telephone Encounter (Signed)
Pt request to update immunizations, had a Zoster on 08/24/11 & a Flu vaccine on 08/23/14.

## 2014-09-23 ENCOUNTER — Institutional Professional Consult (permissible substitution): Payer: Medicare Other | Admitting: Internal Medicine

## 2014-09-24 ENCOUNTER — Telehealth: Payer: Self-pay | Admitting: Family Medicine

## 2014-09-24 NOTE — Telephone Encounter (Signed)
Pt left a voice message stating that he received a bill for his recent lab work. He did say that he called and talked to someone in billing, however not sure if that was actual number on lab bill or the billing department for [place].

## 2014-09-25 ENCOUNTER — Ambulatory Visit (INDEPENDENT_AMBULATORY_CARE_PROVIDER_SITE_OTHER): Payer: Medicare Other | Admitting: Internal Medicine

## 2014-09-25 ENCOUNTER — Encounter: Payer: Self-pay | Admitting: Internal Medicine

## 2014-09-25 ENCOUNTER — Encounter: Payer: Self-pay | Admitting: *Deleted

## 2014-09-25 ENCOUNTER — Ambulatory Visit (INDEPENDENT_AMBULATORY_CARE_PROVIDER_SITE_OTHER): Payer: Medicare Other | Admitting: Pharmacist

## 2014-09-25 VITALS — BP 140/80 | HR 59 | Ht 71.5 in | Wt 214.8 lb

## 2014-09-25 DIAGNOSIS — I482 Chronic atrial fibrillation, unspecified: Secondary | ICD-10-CM

## 2014-09-25 DIAGNOSIS — I480 Paroxysmal atrial fibrillation: Secondary | ICD-10-CM

## 2014-09-25 DIAGNOSIS — Z5181 Encounter for therapeutic drug level monitoring: Secondary | ICD-10-CM

## 2014-09-25 DIAGNOSIS — I4891 Unspecified atrial fibrillation: Secondary | ICD-10-CM

## 2014-09-25 DIAGNOSIS — Z7901 Long term (current) use of anticoagulants: Secondary | ICD-10-CM

## 2014-09-25 DIAGNOSIS — I10 Essential (primary) hypertension: Secondary | ICD-10-CM

## 2014-09-25 LAB — POCT INR: INR: 2.6

## 2014-09-25 NOTE — Patient Instructions (Signed)
Your physician has recommended that you have an ablation. Catheter ablation is a medical procedure used to treat some cardiac arrhythmias (irregular heartbeats). During catheter ablation, a long, thin, flexible tube is put into a blood vessel in your groin (upper thigh), or neck. This tube is called an ablation catheter. It is then guided to your heart through the blood vessel. Radio frequency waves destroy small areas of heart tissue where abnormal heartbeats may cause an arrhythmia to start. Please see the instruction sheet given to you today.  See instruction sheet for procedure  Will need weekly INR's starting week of 10/14/14

## 2014-09-25 NOTE — Progress Notes (Signed)
Primary Care Physician: [name], MD Primary Cardiologist:  Dr. Percival Spanish Primary Electrophysiologist:Dr. Cieanna Stormes Referring Physician:Dr. Dontrae Morini is a 80 y.o. male with a h/o  paroxysmal/ atrial fibrillation who presents for consultation in the [facility].  The patient was initially diagnosed with atrial fibrillation in 2008 after presenting with symptoms of rapid heartbeat.He has had progressive increase in bouts of afib and recently was tried on flecainide which he did not tolerate due to dizziness/imbalance/bradycardia. He currently has a tendency for bradycardia, which may limit AAD options. He recently had metoprolol decreased due to slow heart rate. He usually does take extra metoprolol for breakthrough afib which can last up to 16 hours. Heart rate is not that rapid in afib, usually in the 90's. He will usual slow down his activities with PAF, but can still function. Does not have snoring history, no alcohol or tobacco use, exercises daily on the treadmill. BP controlled. He is appropriately anticoagulated on warfarin, without bleeding issues.  Today, he denies symptoms of palpitations, chest pain, shortness of breath, orthopnea, PND, lower extremity edema, dizziness, presyncope, syncope, snoring, daytime somnolence, bleeding, or neurologic sequela. The patient is tolerating medications without difficulties and is otherwise without complaint today.    Atrial Fibrillation Risk Factors:  he does not have symptoms or diagnosis of sleep apnea.  he does not have a history of rheumatic fever.  he does not have a history of alcohol use.  he has a BMI of Body mass index is 29.54 kg/(m^2).Marland Kitchen Filed Weights   09/25/14 1508  Weight: 214 lb 12.8 oz (97.433 kg)    LA size: 14mm   Atrial Fibrillation Management history:  Previous antiarrhythmic drugs: flecainide  Previous cardioversions: none  Previous ablations:none  CHADS2VASC  score:at least 3 This patients CHA2DS2-VASc Score and unadjusted Ischemic Stroke Rate (% per year) is equal to 3.2 % stroke rate/year from a score of 3  Above score calculated as 1 point each if present [CHF, HTN, DM, Vascular=MI/PAD/Aortic Plaque, Age if 65-74, or Male] Above score calculated as 2 points each if present [Age > 75, or Stroke/TIA/TE]   Anticoagulation history: warfarin   Past Medical History  Diagnosis Date  . Allergic rhinitis   . Hyperlipidemia   . HTN (hypertension)   . Low back pain   . Paroxysmal atrial fibrillation     sees Dr. Percival Spanish   . GERD (gastroesophageal reflux disease)   . Glaucoma   . Hx of colonic polyps     tubular adenoma   . Nephrolithiasis   . Hemorrhoids     internal  . Pancolonic diverticulosis   . Cancer     FACIAL MELONOMA  . Cataract     REMOVED  . Kidney stone    Past Surgical History  Procedure Laterality Date  . Cholecystectomy  02/1990    Dr. [name]  . Vasectomy    . Right inguinal hernia repair  04/1990    Dr. [name]  . Refractive surgery    . Ruptured blood vessel in the bladder    . Removed 2 stones from left ureter    . Blood vessel repair in bladder    . Colonoscopy  06-29-12    per Dr. [name], adenomatous polyps, repeat in 3 yrs     Current Outpatient Prescriptions  Medication Sig Dispense Refill  . acetaminophen (TYLENOL) 325 MG tablet Take 325-650 mg by mouth daily.     . cetirizine (ZYRTEC)  10 MG tablet Take 10 mg by mouth every morning.     . latanoprost (XALATAN) 0.005 % ophthalmic solution Place 1 drop into both eyes at bedtime.     Marland Kitchen lisinopril (PRINIVIL,ZESTRIL) 10 MG tablet Take 10 mg by mouth at bedtime.     Marland Kitchen loratadine (CLARITIN) 10 MG tablet Take 10 mg by mouth at bedtime.     . metoprolol tartrate (LOPRESSOR) 25 MG tablet Take 25 mg by mouth 2 (two) times daily.    . mometasone (NASONEX) 50 MCG/ACT nasal spray Place 2 sprays into both nostrils daily as needed (allergies).     . Multiple  Vitamins-Minerals (CENTRUM SILVER PO) Take 1 tablet by mouth every morning.     Marland Kitchen omeprazole (PRILOSEC) 20 MG capsule Take 20 mg by mouth 2 (two) times daily.     . potassium chloride (KLOR-CON) 10 MEQ CR tablet Take 10 mEq by mouth every morning.     . Psyllium (METAMUCIL PO) Take 30 mLs by mouth 2 (two) times daily.     . simvastatin (ZOCOR) 40 MG tablet Take 40 mg by mouth at bedtime.      . tamsulosin (FLOMAX) 0.4 MG CAPS Take 0.4 mg by mouth every evening.     . warfarin (COUMADIN) 5 MG tablet Take 1 tablet by mouth daily or as directed by coumadin clinic 30 tablet 5   No current facility-administered medications for this visit.    Allergies  Allergen Reactions  . Sulfa Antibiotics Other (See Comments)    Bad problem on groin area  . Sulfamethoxazole     REACTION: rash    History   Social History  . Marital Status: Married    Spouse Name: N/A    Number of Children: 2  . Years of Education: N/A   Occupational History  . Retired    Social History Main Topics  . Smoking status: Never Smoker   . Smokeless tobacco: Never Used  . Alcohol Use: No  . Drug Use: No  . Sexual Activity: Not on file   Other Topics Concern  . Not on file   Social History Narrative    Family History  Problem Relation Age of Onset  . Heart attack Father 54  . Heart disease Father   . Colon cancer Neg Hx   . Ovarian cancer Mother    The patient does not have a history of early familial atrial fibrillation or other arrhythmias.  ROS- All systems are reviewed and negative except as per the HPI above.  Physical Exam: Filed Vitals:   09/25/14 1508  BP: 140/80  Pulse: 59  Height: 5' 11.5" (1.816 m)  Weight: 214 lb 12.8 oz (97.433 kg)    GEN- The patient is well appearing, alert and oriented x 3 today.   Head- normocephalic, atraumatic Eyes-  Sclera clear, conjunctiva pink Ears- hearing intact Oropharynx- clear Neck- supple, no JVP Lymph- no cervical lymphadenopathy Lungs- Clear to  ausculation bilaterally, normal work of breathing Heart- Regular rate and rhythm, no murmurs, rubs or gallops, PMI not laterally displaced GI- soft, NT, ND, + BS Extremities- no clubbing, cyanosis, or edema MS- no significant deformity or atrophy Skin- no rash or lesion Psych- euthymic mood, full affect Neuro- strength and sensation are intact  EKG Sinus bradycardia, otherwise normal EKG, 59 bpm ,PR 158 ms,QRS, 94,QTc, 434ms  Echo- Left ventricle: The cavity size was normal. There was mild concentric hypertrophy. Systolic function was vigorous. The estimated ejection fraction was in the range  of 65% to 70%. Wall motion was normal; there were no regional wall motion abnormalities. Left ventricular diastolic function parameters were normal. - Aortic valve: There was no regurgitation. - Aorta: The aorta was normal, not dilated, and non-diseased. - Mitral valve: There was mild regurgitation. - Left atrium: The atrium was normal in size. - Right ventricle: Systolic function was normal. - Right atrium: The atrium was normal in size. - Tricuspid valve: There was no regurgitation. - Pulmonic valve: There was no regurgitation. - Pulmonary arteries: The main pulmonary artery was normal-sized. PA peak pressure: 33 mm Hg (S). - Inferior vena cava: The vessel was normal in size. The respirophasic diameter changes were in the normal range (= 50%), consistent with normal central venous pressure.     Ref Range 8mo ago    Sodium 135 - 145 mEq/L 138   Potassium 3.5 - 5.1 mEq/L 4.5   Chloride 96 - 112 mEq/L 100   CO2 19 - 32 mEq/L 31   Glucose, Bld 70 - 99 mg/dL 98   BUN 6 - 23 mg/dL 28 (H)   Creatinine, Ser 0.4 - 1.5 mg/dL 1.3   Calcium 8.4 - 10.5 mg/dL 9.7   GFR >60.00 mL/min 56.51 (L)       Epic records are reviewed at length today  Assessment and Plan:  1. Atrial fibrillation  The patient has Symptomatic paroxysmal  atrial fibrillation.  The patients  CHAD2VASC score is at least 3.  he is  appropriately anticoagulated at this time. The patient is adequately rate controlled with metoprolol tartrate.. Antiarrhythmic therapy to dates has included flecainide.  The patients left atrial size is 36 mm.  Additional echo findings include LVH with EF 65-70%   A long discussion with the patient was had today regarding therapeutic strategies.      Recommendations:   Therapeutic strategies for afib including medicine and ablation were discussed in detail with the patient today. Risk, benefits, and alternatives to EP study and radiofrequency ablation for afib were also discussed in detail today. These risks include but are not limited to stroke, bleeding, vascular damage, tamponade, perforation, damage to the esophagus, lungs, and other structures, pulmonary vein stenosis, worsening renal function, and death. The patient understands these risk and wishes to proceed.     Roderic Palau NP  Nurse Practitioner, [facility]   I have seen, examined the patient, and reviewed the above assessment and plan with Roderic Palau NP.  Changes to above are made where necessary.   Will proceed with catheter ablation.   Co Sign: [name], MD 09/25/2014 9:14 PM

## 2014-10-04 ENCOUNTER — Encounter: Payer: Self-pay | Admitting: Family Medicine

## 2014-10-04 ENCOUNTER — Ambulatory Visit (INDEPENDENT_AMBULATORY_CARE_PROVIDER_SITE_OTHER): Payer: Medicare Other | Admitting: Family Medicine

## 2014-10-04 VITALS — BP 142/60 | HR 66 | Temp 98.1°F | Ht 71.5 in | Wt 211.5 lb

## 2014-10-04 DIAGNOSIS — I10 Essential (primary) hypertension: Secondary | ICD-10-CM

## 2014-10-04 DIAGNOSIS — J209 Acute bronchitis, unspecified: Secondary | ICD-10-CM | POA: Diagnosis not present

## 2014-10-04 MED ORDER — HYDROCODONE-HOMATROPINE 5-1.5 MG/5ML PO SYRP: 5.0000 mL | ORAL_SOLUTION | Freq: Three times a day (TID) | ORAL | Status: DC | PRN

## 2014-10-04 MED ORDER — CEFDINIR 300 MG PO CAPS: 300.0000 mg | ORAL_CAPSULE | Freq: Two times a day (BID) | ORAL | Status: DC

## 2014-10-04 NOTE — Progress Notes (Signed)
Pre visit review using our clinic review tool, if applicable. No additional management support is needed unless otherwise documented below in the visit note. 

## 2014-10-04 NOTE — Patient Instructions (Signed)
Probiotics daily for next couple of months. Such as Digestive Advantage or Phillip's Colon Health daily   Acute Bronchitis Bronchitis is inflammation of the airways that extend from the windpipe into the lungs (bronchi). The inflammation often causes mucus to develop. This leads to a cough, which is the most common symptom of bronchitis.  In acute bronchitis, the condition usually develops suddenly and goes away over time, usually in a couple weeks. Smoking, allergies, and asthma can make bronchitis worse. Repeated episodes of bronchitis may cause further lung problems.  CAUSES Acute bronchitis is most often caused by the same virus that causes a cold. The virus can spread from person to person (contagious) through coughing, sneezing, and touching contaminated objects. SIGNS AND SYMPTOMS   Cough.   Fever.   Coughing up mucus.   Body aches.   Chest congestion.   Chills.   Shortness of breath.   Sore throat.  DIAGNOSIS  Acute bronchitis is usually diagnosed through a physical exam. Your health care provider will also ask you questions about your medical history. Tests, such as chest X-rays, are sometimes done to rule out other conditions.  TREATMENT  Acute bronchitis usually goes away in a couple weeks. Oftentimes, no medical treatment is necessary. Medicines are sometimes given for relief of fever or cough. Antibiotic medicines are usually not needed but may be prescribed in certain situations. In some cases, an inhaler may be recommended to help reduce shortness of breath and control the cough. A cool mist vaporizer may also be used to help thin bronchial secretions and make it easier to clear the chest.  HOME CARE INSTRUCTIONS  Get plenty of rest.   Drink enough fluids to keep your urine clear or pale yellow (unless you have a medical condition that requires fluid restriction). Increasing fluids may help thin your respiratory secretions (sputum) and reduce chest congestion,  and it will prevent dehydration.   Take medicines only as directed by your health care provider.  If you were prescribed an antibiotic medicine, finish it all even if you start to feel better.  Avoid smoking and secondhand smoke. Exposure to cigarette smoke or irritating chemicals will make bronchitis worse. If you are a smoker, consider using nicotine gum or skin patches to help control withdrawal symptoms. Quitting smoking will help your lungs heal faster.   Reduce the chances of another bout of acute bronchitis by washing your hands frequently, avoiding people with cold symptoms, and trying not to touch your hands to your mouth, nose, or eyes.   Keep all follow-up visits as directed by your health care provider.  SEEK MEDICAL CARE IF: Your symptoms do not improve after 1 week of treatment.  SEEK IMMEDIATE MEDICAL CARE IF:  You develop an increased fever or chills.   You have chest pain.   You have severe shortness of breath.  You have bloody sputum.   You develop dehydration.  You faint or repeatedly feel like you are going to pass out.  You develop repeated vomiting.  You develop a severe headache. MAKE SURE YOU:   Understand these instructions.  Will watch your condition.  Will get help right away if you are not doing well or get worse. Document Released: 12/02/2004 Document Revised: 03/11/2014 Document Reviewed: 04/17/2013 [facility] Patient Information 2015 [place], [place]. This information is not intended to replace advice given to you by your health care provider. Make sure you discuss any questions you have with your health care provider.

## 2014-10-06 ENCOUNTER — Encounter: Payer: Self-pay | Admitting: Family Medicine

## 2014-10-06 NOTE — Assessment & Plan Note (Signed)
Encouraged increased rest and hydration, add probiotics, zinc such as Coldeze or Xicam. Treat fevers as needed. Given rx for Cefdinir to use prn if symptoms worsen or do not improve.

## 2014-10-06 NOTE — Assessment & Plan Note (Signed)
Well controlled with mild elevation with acute illness, no changes to meds. Encouraged heart healthy diet, minimize sodium and caffeine

## 2014-10-06 NOTE — Progress Notes (Signed)
Patient ID: [name] [name], male   DOB: [birth year], 80 y.o.   MRN: [id number]   [name] [name]  [id number] 1928-12-31 10/06/2014      Progress Note-Follow Up  Subjective  Chief Complaint  No chief complaint on file.   HPI  Patient is a 80 y.o. male in today for routine medical care. Patient is in today complaining of a cough. He has been ill for about 5 days. Had a sore throat initially which is now improved but he now has nasal congestion productive of clear phlegm cough noted which is generally nonproductive. Does have some postnasal drip. His cough is keeping him up and there is some low-grade wheezing. Denies fevers, chills, myalgias, chest pain, headaches, GI complaints. No ear pain.  Past Medical History  Diagnosis Date  . Allergic rhinitis   . Hyperlipidemia   . HTN (hypertension)   . Low back pain   . Paroxysmal atrial fibrillation     sees Dr. Percival Spanish   . GERD (gastroesophageal reflux disease)   . Glaucoma   . Hx of colonic polyps     tubular adenoma   . Nephrolithiasis   . Hemorrhoids     internal  . Pancolonic diverticulosis   . Cancer     FACIAL MELONOMA  . Cataract     REMOVED  . Kidney stone     Past Surgical History  Procedure Laterality Date  . Cholecystectomy  02/1990    Dr. [name]  . Vasectomy    . Right inguinal hernia repair  04/1990    Dr. [name]  . Refractive surgery    . Ruptured blood vessel in the bladder    . Removed 2 stones from left ureter    . Blood vessel repair in bladder    . Colonoscopy  06-29-12    per Dr. [name], adenomatous polyps, repeat in 3 yrs     Family History  Problem Relation Age of Onset  . Heart attack Father 62  . Heart disease Father   . Colon cancer Neg Hx   . Ovarian cancer Mother     History   Social History  . Marital Status: Married    Spouse Name: N/A    Number of Children: 2  . Years of Education: N/A   Occupational History  . Retired    Social History Main Topics  . Smoking status:  Never Smoker   . Smokeless tobacco: Never Used  . Alcohol Use: No  . Drug Use: No  . Sexual Activity: Not on file   Other Topics Concern  . Not on file   Social History Narrative    Current Outpatient Prescriptions on File Prior to Visit  Medication Sig Dispense Refill  . acetaminophen (TYLENOL) 325 MG tablet Take 325-650 mg by mouth daily.     . cetirizine (ZYRTEC) 10 MG tablet Take 10 mg by mouth every morning.     . latanoprost (XALATAN) 0.005 % ophthalmic solution Place 1 drop into both eyes at bedtime.     Marland Kitchen lisinopril (PRINIVIL,ZESTRIL) 10 MG tablet Take 10 mg by mouth at bedtime.     Marland Kitchen loratadine (CLARITIN) 10 MG tablet Take 10 mg by mouth at bedtime.     . metoprolol tartrate (LOPRESSOR) 25 MG tablet Take 25 mg by mouth 2 (two) times daily.    . mometasone (NASONEX) 50 MCG/ACT nasal spray Place 2 sprays into both nostrils daily as needed (allergies).     . Multiple Vitamins-Minerals (  CENTRUM SILVER PO) Take 1 tablet by mouth every morning.     Marland Kitchen omeprazole (PRILOSEC) 20 MG capsule Take 20 mg by mouth 2 (two) times daily.     . potassium chloride (KLOR-CON) 10 MEQ CR tablet Take 10 mEq by mouth every morning.     . Psyllium (METAMUCIL PO) Take 30 mLs by mouth 2 (two) times daily.     . simvastatin (ZOCOR) 40 MG tablet Take 40 mg by mouth at bedtime.      . tamsulosin (FLOMAX) 0.4 MG CAPS Take 0.4 mg by mouth every evening.     . warfarin (COUMADIN) 5 MG tablet Take 1 tablet by mouth daily or as directed by coumadin clinic 30 tablet 5   No current facility-administered medications on file prior to visit.    Allergies  Allergen Reactions  . Sulfa Antibiotics Other (See Comments)    Bad problem on groin area  . Sulfamethoxazole     REACTION: rash    Review of Systems  Review of Systems  Constitutional: Positive for malaise/fatigue. Negative for fever.  HENT: Positive for congestion.   Eyes: Negative for discharge.  Respiratory: Positive for cough and sputum  production. Negative for shortness of breath.   Cardiovascular: Negative for chest pain, palpitations and leg swelling.  Gastrointestinal: Negative for nausea, abdominal pain and diarrhea.  Genitourinary: Negative for dysuria.  Musculoskeletal: Negative for falls.  Skin: Negative for rash.  Neurological: Negative for loss of consciousness and headaches.  Endo/Heme/Allergies: Negative for polydipsia.  Psychiatric/Behavioral: Negative for depression and suicidal ideas. The patient is not nervous/anxious and does not have insomnia.     Objective  BP 142/60 mmHg  Pulse 66  Temp(Src) 98.1 F (36.7 C) (Oral)  Ht 5' 11.5" (1.816 m)  Wt 211 lb 8 oz (95.936 kg)  BMI 29.09 kg/m2  SpO2 96%  Physical Exam  Physical Exam  Constitutional: He is oriented to person, place, and time and well-developed, well-nourished, and in no distress. No distress.  HENT:  Head: Normocephalic and atraumatic.  Eyes: Conjunctivae are normal.  Neck: Neck supple. No thyromegaly present.  Cardiovascular: Normal rate, regular rhythm and normal heart sounds.   No murmur heard. Pulmonary/Chest: Effort normal and breath sounds normal. No respiratory distress.  Abdominal: He exhibits no distension and no mass. There is no tenderness.  Musculoskeletal: He exhibits no edema.  Neurological: He is alert and oriented to person, place, and time.  Skin: Skin is warm.  Psychiatric: Memory, affect and judgment normal.    Lab Results  Component Value Date   TSH 1.12 08/23/2014   Lab Results  Component Value Date   WBC 8.1 08/23/2014   HGB 13.7 08/23/2014   HCT 41.7 08/23/2014   MCV 96.1 08/23/2014   PLT 175.0 08/23/2014   Lab Results  Component Value Date   CREATININE 1.3 08/23/2014   BUN 28* 08/23/2014   NA 138 08/23/2014   K 4.5 08/23/2014   CL 100 08/23/2014   CO2 31 08/23/2014   Lab Results  Component Value Date   ALT 22 08/23/2014   AST 23 08/23/2014   ALKPHOS 51 08/23/2014   BILITOT 1.3*  08/23/2014   Lab Results  Component Value Date   CHOL 129 08/23/2014   Lab Results  Component Value Date   HDL 33.50* 08/23/2014   Lab Results  Component Value Date   LDLCALC 74 08/23/2014   Lab Results  Component Value Date   TRIG 109.0 08/23/2014   Lab Results  Component Value Date   CHOLHDL 4 08/23/2014     Assessment & Plan  Essential hypertension Well controlled with mild elevation with acute illness, no changes to meds. Encouraged heart healthy diet, minimize sodium and caffeine  Acute bronchitis Encouraged increased rest and hydration, add probiotics, zinc such as Coldeze or Xicam. Treat fevers as needed. Given rx for Cefdinir to use prn if symptoms worsen or do not improve.

## 2014-10-11 ENCOUNTER — Encounter: Payer: Self-pay | Admitting: Family Medicine

## 2014-10-11 ENCOUNTER — Ambulatory Visit (INDEPENDENT_AMBULATORY_CARE_PROVIDER_SITE_OTHER): Payer: Medicare Other | Admitting: Family Medicine

## 2014-10-11 VITALS — BP 148/70 | HR 69 | Temp 98.1°F | Wt 214.0 lb

## 2014-10-11 DIAGNOSIS — J209 Acute bronchitis, unspecified: Secondary | ICD-10-CM | POA: Diagnosis not present

## 2014-10-11 MED ORDER — CLARITHROMYCIN 500 MG PO TABS: 500.0000 mg | ORAL_TABLET | Freq: Two times a day (BID) | ORAL | Status: DC

## 2014-10-11 MED ORDER — METHYLPREDNISOLONE ACETATE 80 MG/ML IJ SUSP
120.0000 mg | Freq: Once | INTRAMUSCULAR | Status: AC
  Administered 2014-10-11: 120 mg via INTRAMUSCULAR

## 2014-10-11 MED ORDER — HYDROCODONE-HOMATROPINE 5-1.5 MG/5ML PO SYRP: 5.0000 mL | ORAL_SOLUTION | Freq: Three times a day (TID) | ORAL | Status: DC | PRN

## 2014-10-11 NOTE — Addendum Note (Signed)
Addended by: Westley Hummer B on: 10/11/2014 02:18 PM   Modules accepted: Orders

## 2014-10-11 NOTE — Progress Notes (Signed)
   Subjective:    Patient ID: [name] [name], male    DOB: 05-10-1929, 80 y.o.   MRN: [id number]  HPI Here for several week of chest tightness and a dry cough. No fever. He saw Dr. Randel Pigg recently and was given a course of [place]. This has not helped very much.    Review of Systems  Constitutional: Negative.   HENT: Negative.   Eyes: Negative.   Respiratory: Positive for cough, chest tightness and shortness of breath. Negative for wheezing.   Cardiovascular: Negative.        Objective:   Physical Exam  Constitutional: He appears well-developed and well-nourished.  HENT:  Right Ear: External ear normal.  Left Ear: External ear normal.  Nose: Nose normal.  Mouth/Throat: Oropharynx is clear and moist.  Eyes: Conjunctivae are normal.  Cardiovascular: Normal rate, regular rhythm, normal heart sounds and intact distal pulses.   Pulmonary/Chest: Effort normal. No respiratory distress. He has no wheezes. He has no rales.  scattered rhonchi   Lymphadenopathy:    He has no cervical adenopathy.          Assessment & Plan:  Given Biaxin and a shot of steroids.

## 2014-10-11 NOTE — Progress Notes (Signed)
Pre visit review using our clinic review tool, if applicable. No additional management support is needed unless otherwise documented below in the visit note. 

## 2014-10-14 ENCOUNTER — Ambulatory Visit (INDEPENDENT_AMBULATORY_CARE_PROVIDER_SITE_OTHER): Payer: Medicare Other

## 2014-10-14 DIAGNOSIS — Z5181 Encounter for therapeutic drug level monitoring: Secondary | ICD-10-CM | POA: Diagnosis not present

## 2014-10-14 DIAGNOSIS — Z7901 Long term (current) use of anticoagulants: Secondary | ICD-10-CM | POA: Diagnosis not present

## 2014-10-14 DIAGNOSIS — I4891 Unspecified atrial fibrillation: Secondary | ICD-10-CM

## 2014-10-14 LAB — POCT INR: INR: 2.9

## 2014-10-21 ENCOUNTER — Ambulatory Visit (INDEPENDENT_AMBULATORY_CARE_PROVIDER_SITE_OTHER): Payer: Medicare Other | Admitting: Pharmacist

## 2014-10-21 DIAGNOSIS — I4891 Unspecified atrial fibrillation: Secondary | ICD-10-CM | POA: Diagnosis not present

## 2014-10-21 DIAGNOSIS — Z7901 Long term (current) use of anticoagulants: Secondary | ICD-10-CM

## 2014-10-21 DIAGNOSIS — Z5181 Encounter for therapeutic drug level monitoring: Secondary | ICD-10-CM

## 2014-10-21 LAB — POCT INR: INR: 2.6

## 2014-10-28 ENCOUNTER — Ambulatory Visit (INDEPENDENT_AMBULATORY_CARE_PROVIDER_SITE_OTHER): Payer: Medicare Other | Admitting: Pharmacist

## 2014-10-28 DIAGNOSIS — Z5181 Encounter for therapeutic drug level monitoring: Secondary | ICD-10-CM

## 2014-10-28 DIAGNOSIS — Z7901 Long term (current) use of anticoagulants: Secondary | ICD-10-CM

## 2014-10-28 DIAGNOSIS — I4891 Unspecified atrial fibrillation: Secondary | ICD-10-CM

## 2014-10-28 LAB — POCT INR: INR: 2.9

## 2014-10-30 ENCOUNTER — Encounter (HOSPITAL_COMMUNITY): Payer: Self-pay | Admitting: Pharmacy Technician

## 2014-11-05 ENCOUNTER — Other Ambulatory Visit (INDEPENDENT_AMBULATORY_CARE_PROVIDER_SITE_OTHER): Payer: Medicare Other | Admitting: *Deleted

## 2014-11-05 ENCOUNTER — Ambulatory Visit (INDEPENDENT_AMBULATORY_CARE_PROVIDER_SITE_OTHER): Payer: Medicare Other | Admitting: *Deleted

## 2014-11-05 DIAGNOSIS — Z5181 Encounter for therapeutic drug level monitoring: Secondary | ICD-10-CM

## 2014-11-05 DIAGNOSIS — Z7901 Long term (current) use of anticoagulants: Secondary | ICD-10-CM

## 2014-11-05 DIAGNOSIS — I4891 Unspecified atrial fibrillation: Secondary | ICD-10-CM

## 2014-11-05 DIAGNOSIS — I480 Paroxysmal atrial fibrillation: Secondary | ICD-10-CM | POA: Diagnosis not present

## 2014-11-05 LAB — CBC WITH DIFFERENTIAL/PLATELET
BASOS PCT: 0.4 % (ref 0.0–3.0)
Basophils Absolute: 0 10*3/uL (ref 0.0–0.1)
EOS ABS: 0.2 10*3/uL (ref 0.0–0.7)
EOS PCT: 2.1 % (ref 0.0–5.0)
HCT: 39.4 % (ref 39.0–52.0)
HEMOGLOBIN: 12.8 g/dL — AB (ref 13.0–17.0)
LYMPHS PCT: 25.3 % (ref 12.0–46.0)
Lymphs Abs: 2.6 10*3/uL (ref 0.7–4.0)
MCHC: 32.6 g/dL (ref 30.0–36.0)
MCV: 96.9 fl (ref 78.0–100.0)
Monocytes Absolute: 0.9 10*3/uL (ref 0.1–1.0)
Monocytes Relative: 8.3 % (ref 3.0–12.0)
Neutro Abs: 6.6 10*3/uL (ref 1.4–7.7)
Neutrophils Relative %: 63.9 % (ref 43.0–77.0)
Platelets: 151 10*3/uL (ref 150.0–400.0)
RBC: 4.07 Mil/uL — AB (ref 4.22–5.81)
RDW: 14.1 % (ref 11.5–15.5)
WBC: 10.3 10*3/uL (ref 4.0–10.5)

## 2014-11-05 LAB — BASIC METABOLIC PANEL
BUN: 22 mg/dL (ref 6–23)
CO2: 31 mEq/L (ref 19–32)
CREATININE: 1 mg/dL (ref 0.4–1.5)
Calcium: 9.1 mg/dL (ref 8.4–10.5)
Chloride: 106 mEq/L (ref 96–112)
GFR: 72.43 mL/min (ref >60.00)
Glucose, Bld: 101 mg/dL — ABNORMAL HIGH (ref 70–99)
POTASSIUM: 4.3 meq/L (ref 3.5–5.1)
Sodium: 142 mEq/L (ref 135–145)

## 2014-11-05 LAB — POCT INR: INR: 3

## 2014-11-06 ENCOUNTER — Telehealth: Payer: Self-pay | Admitting: *Deleted

## 2014-11-06 ENCOUNTER — Telehealth: Payer: Self-pay | Admitting: Family Medicine

## 2014-11-06 DIAGNOSIS — J209 Acute bronchitis, unspecified: Secondary | ICD-10-CM

## 2014-11-06 MED ORDER — HYDROCODONE-HOMATROPINE 5-1.5 MG/5ML PO SYRP: 5.0000 mL | ORAL_SOLUTION | Freq: Three times a day (TID) | ORAL | Status: DC | PRN

## 2014-11-06 NOTE — Telephone Encounter (Signed)
Here with his wife. He is still coughing some but feels better

## 2014-11-06 NOTE — Telephone Encounter (Signed)
Spoke with patient regarding time change for AF ablation on 11-12-14.  Will do TEE 11-11-14 at 10AM with Dr Radford Pax with INR prior to procedure (endo aware).  AF ablation scheduled for 11-12-14 at 7:30AM.    Pt is aware of time changes and when to arrive at hospital.  [facility] appt cancelled for 11-11-14.    Chanetta Marshall, RN 11/06/2014 12:13 PM

## 2014-11-11 ENCOUNTER — Ambulatory Visit (HOSPITAL_COMMUNITY)
Admission: RE | Admit: 2014-11-11 | Discharge: 2014-11-11 | Disposition: A | Discharge status: Home / Self Care | Payer: Medicare Other | Source: Ambulatory Visit | Attending: Cardiology | Admitting: Cardiology

## 2014-11-11 ENCOUNTER — Encounter (HOSPITAL_COMMUNITY): Payer: Self-pay

## 2014-11-11 ENCOUNTER — Encounter (HOSPITAL_COMMUNITY)
Admission: RE | Disposition: A | Discharge status: Home / Self Care | Payer: Self-pay | Source: Ambulatory Visit | Attending: Cardiology

## 2014-11-11 DIAGNOSIS — I480 Paroxysmal atrial fibrillation: Secondary | ICD-10-CM | POA: Diagnosis not present

## 2014-11-11 DIAGNOSIS — I700 Atherosclerosis of aorta: Secondary | ICD-10-CM | POA: Diagnosis not present

## 2014-11-11 DIAGNOSIS — Z7901 Long term (current) use of anticoagulants: Secondary | ICD-10-CM | POA: Insufficient documentation

## 2014-11-11 DIAGNOSIS — E785 Hyperlipidemia, unspecified: Secondary | ICD-10-CM | POA: Insufficient documentation

## 2014-11-11 DIAGNOSIS — Z882 Allergy status to sulfonamides status: Secondary | ICD-10-CM | POA: Diagnosis not present

## 2014-11-11 DIAGNOSIS — I340 Nonrheumatic mitral (valve) insufficiency: Secondary | ICD-10-CM | POA: Diagnosis not present

## 2014-11-11 DIAGNOSIS — I10 Essential (primary) hypertension: Secondary | ICD-10-CM | POA: Diagnosis not present

## 2014-11-11 DIAGNOSIS — K219 Gastro-esophageal reflux disease without esophagitis: Secondary | ICD-10-CM | POA: Diagnosis not present

## 2014-11-11 HISTORY — PX: TEE WITHOUT CARDIOVERSION: SHX5443

## 2014-11-11 LAB — PROTIME-INR
INR: 2.54 — ABNORMAL HIGH (ref 0.00–1.49)
Prothrombin Time: 27.6 seconds — ABNORMAL HIGH (ref 11.6–15.2)

## 2014-11-11 SURGERY — ECHOCARDIOGRAM, TRANSESOPHAGEAL
Anesthesia: Moderate Sedation

## 2014-11-11 MED ORDER — FENTANYL CITRATE 0.05 MG/ML IJ SOLN
INTRAMUSCULAR | Status: AC
  Filled 2014-11-11: qty 2

## 2014-11-11 MED ORDER — BUTAMBEN-TETRACAINE-BENZOCAINE 2-2-14 % EX AERO
INHALATION_SPRAY | CUTANEOUS | Status: DC | PRN
  Administered 2014-11-11: 2 via TOPICAL

## 2014-11-11 MED ORDER — MIDAZOLAM HCL 5 MG/ML IJ SOLN
INTRAMUSCULAR | Status: AC
  Filled 2014-11-11: qty 1

## 2014-11-11 MED ORDER — SODIUM CHLORIDE 0.9 % IV SOLN: INTRAVENOUS | Status: DC

## 2014-11-11 MED ORDER — MIDAZOLAM HCL 10 MG/2ML IJ SOLN
INTRAMUSCULAR | Status: DC | PRN
  Administered 2014-11-11 (×2): 2 mg via INTRAVENOUS

## 2014-11-11 MED ORDER — LIDOCAINE VISCOUS 2 % MT SOLN
OROMUCOSAL | Status: AC
  Filled 2014-11-11: qty 15

## 2014-11-11 MED ORDER — LIDOCAINE VISCOUS 2 % MT SOLN
OROMUCOSAL | Status: DC | PRN
  Administered 2014-11-11: 5 mL via OROMUCOSAL

## 2014-11-11 MED ORDER — FENTANYL CITRATE 0.05 MG/ML IJ SOLN
INTRAMUSCULAR | Status: DC | PRN
  Administered 2014-11-11 (×2): 25 ug via INTRAVENOUS

## 2014-11-11 NOTE — H&P (Signed)
Expand All Collapse All      Primary Care Physician: [name], MD Primary Cardiologist: Dr. Percival Spanish Primary Electrophysiologist:Dr. Allred Referring Physician:Dr. Efraim Vanallen is a 81 y.o. male with a h/o paroxysmal/ atrial fibrillation who presented in November for consultation in the [facility]. The patient was initially diagnosed with atrial fibrillation in 2008 after presenting with symptoms of rapid heartbeat.He has had progressive increase in bouts of afib and recently was tried on flecainide which he did not tolerate due to dizziness/imbalance/bradycardia. He currently has a tendency for bradycardia, which may limit AAD options. He recently had metoprolol decreased due to slow heart rate. He usually does take extra metoprolol for breakthrough afib which can last up to 16 hours. Heart rate is not that rapid in afib, usually in the 90's. He will usual slow down his activities with PAF, but can still function. Does not have snoring history, no alcohol or tobacco use, exercises daily on the treadmill. BP controlled. He is appropriately anticoagulated on warfarin, without bleeding issues.  He was seen in consultation with Dr. [name] and set up for TEE prior to undergoing afib ablation.  He is here today for TEE>  Today, he denies symptoms of palpitations, chest pain, shortness of breath, orthopnea, PND, lower extremity edema, dizziness, presyncope, syncope, snoring, daytime somnolence, bleeding, or neurologic sequela.   Atrial Fibrillation Risk Factors:  he does not have symptoms or diagnosis of sleep apnea.  he does not have a history of rheumatic fever.  he does not have a history of alcohol use.  he has a BMI of Body mass index is 29.54 kg/(m^2).Marland Kitchen Filed Weights   09/25/14 1508  Weight: 214 lb 12.8 oz (97.433 kg)    LA size: 56mm   Atrial Fibrillation Management history:  Previous antiarrhythmic drugs:  flecainide  Previous cardioversions: none  Previous ablations:none  CHADS2VASC score:at least 3 This patients CHA2DS2-VASc Score and unadjusted Ischemic Stroke Rate (% per year) is equal to 3.2 % stroke rate/year from a score of 3  Above score calculated as 1 point each if present [CHF, HTN, DM, Vascular=MI/PAD/Aortic Plaque, Age if 65-74, or Male] Above score calculated as 2 points each if present [Age > 75, or Stroke/TIA/TE]   Anticoagulation history: warfarin   Past Medical History  Diagnosis Date  . Allergic rhinitis   . Hyperlipidemia   . HTN (hypertension)   . Low back pain   . Paroxysmal atrial fibrillation     sees Dr. Percival Spanish   . GERD (gastroesophageal reflux disease)   . Glaucoma   . Hx of colonic polyps     tubular adenoma   . Nephrolithiasis   . Hemorrhoids     internal  . Pancolonic diverticulosis   . Cancer     FACIAL MELONOMA  . Cataract     REMOVED  . Kidney stone    Past Surgical History  Procedure Laterality Date  . Cholecystectomy  02/1990    Dr. [name]  . Vasectomy    . Right inguinal hernia repair  04/1990    Dr. [name]  . Refractive surgery    . Ruptured blood vessel in the bladder    . Removed 2 stones from left ureter    . Blood vessel repair in bladder    . Colonoscopy  06-29-12    per Dr. [name], adenomatous polyps, repeat in 3 yrs     Current Outpatient Prescriptions  Medication Sig Dispense Refill  .  acetaminophen (TYLENOL) 325 MG tablet Take 325-650 mg by mouth daily.     . cetirizine (ZYRTEC) 10 MG tablet Take 10 mg by mouth every morning.     . latanoprost (XALATAN) 0.005 % ophthalmic solution Place 1 drop into both eyes at bedtime.     Marland Kitchen lisinopril (PRINIVIL,ZESTRIL) 10 MG tablet Take 10 mg by mouth at bedtime.     Marland Kitchen loratadine (CLARITIN) 10 MG tablet Take 10 mg by mouth at bedtime.      . metoprolol tartrate (LOPRESSOR) 25 MG tablet Take 25 mg by mouth 2 (two) times daily.    . mometasone (NASONEX) 50 MCG/ACT nasal spray Place 2 sprays into both nostrils daily as needed (allergies).     . Multiple Vitamins-Minerals (CENTRUM SILVER PO) Take 1 tablet by mouth every morning.     Marland Kitchen omeprazole (PRILOSEC) 20 MG capsule Take 20 mg by mouth 2 (two) times daily.     . potassium chloride (KLOR-CON) 10 MEQ CR tablet Take 10 mEq by mouth every morning.     . Psyllium (METAMUCIL PO) Take 30 mLs by mouth 2 (two) times daily.     . simvastatin (ZOCOR) 40 MG tablet Take 40 mg by mouth at bedtime.     . tamsulosin (FLOMAX) 0.4 MG CAPS Take 0.4 mg by mouth every evening.     . warfarin (COUMADIN) 5 MG tablet Take 1 tablet by mouth daily or as directed by coumadin clinic 30 tablet 5   No current facility-administered medications for this visit.    Allergies  Allergen Reactions  . Sulfa Antibiotics Other (See Comments)    Bad problem on groin area  . Sulfamethoxazole     REACTION: rash    History   Social History  . Marital Status: Married    Spouse Name: N/A    Number of Children: 2  . Years of Education: N/A   Occupational History  . Retired    Social History Main Topics  . Smoking status: Never Smoker   . Smokeless tobacco: Never Used  . Alcohol Use: No  . Drug Use: No  . Sexual Activity: Not on file   Other Topics Concern  . Not on file   Social History Narrative    Family History  Problem Relation Age of Onset  . Heart attack Father 68  . Heart disease Father   . Colon cancer Neg Hx   . Ovarian cancer Mother    The patient does not have a history of early familial atrial fibrillation or other arrhythmias.  ROS- All systems are reviewed and negative except as per the HPI above.  Physical Exam:                     GEN- The patient is well appearing, alert and oriented x 3 today.  Head- normocephalic, atraumatic Eyes- Sclera clear, conjunctiva pink Ears- hearing intact Oropharynx- clear Neck- supple, no JVP Lymph- no cervical lymphadenopathy Lungs- Clear to ausculation bilaterally, normal work of breathing Heart- Regular rate and rhythm, no murmurs, rubs or gallops, PMI not laterally displaced GI- soft, NT, ND, + BS Extremities- no clubbing, cyanosis, or edema MS- no significant deformity or atrophy Skin- no rash or lesion Psych- euthymic mood, full affect Neuro- strength and sensation are intact  EKG Sinus bradycardia, otherwise normal EKG, 59 bpm ,PR 158 ms,QRS, 94,QTc, 462ms  Echo- Left ventricle: The cavity size was normal. There was mild concentric hypertrophy. Systolic function was vigorous. The estimated ejection  fraction was in the range of 65% to 70%. Wall motion was normal; there were no regional wall motion abnormalities. Left ventricular diastolic function parameters were normal. - Aortic valve: There was no regurgitation. - Aorta: The aorta was normal, not dilated, and non-diseased. - Mitral valve: There was mild regurgitation. - Left atrium: The atrium was normal in size. - Right ventricle: Systolic function was normal. - Right atrium: The atrium was normal in size. - Tricuspid valve: There was no regurgitation. - Pulmonic valve: There was no regurgitation. - Pulmonary arteries: The main pulmonary artery was normal-sized. PA peak pressure: 33 mm Hg (S). - Inferior vena cava: The vessel was normal in size. The respirophasic diameter changes were in the normal range (= 50%), consistent with normal central venous pressure.       Epic records are reviewed at length today  Assessment and Plan:  1. Atrial fibrillation  The patient has Symptomatic paroxysmal atrial fibrillation. The patients CHAD2VASC score is at least 3. he is appropriately  anticoagulated at this time. The patient is adequately rate controlled with metoprolol tartrate.. Antiarrhythmic therapy to dates has included flecainide.  The patients left atrial size is 36 mm. Additional echo findings include LVH with EF 65-70%   Plan:  TEE today prior to undergoing afib ablation on 1/5  Signed: Fransico Him, MD [facility] 11/11/2013              Links    Previous Version

## 2014-11-11 NOTE — Discharge Instructions (Signed)
Transesophageal Echocardiogram °Transesophageal echocardiography (TEE) is a picture test of your heart using sound waves. The pictures taken can give very detailed pictures of your heart. This can help your doctor see if there are problems with your heart. TEE can check: °· If your heart has blood clots in it. °· How well your heart valves are working. °· If you have an infection on the inside of your heart. °· Some of the major arteries of your heart. °· If your heart valve is working after a repair. °· Your heart before a procedure that uses a shock to your heart to get the rhythm back to normal. °BEFORE THE PROCEDURE °· Do not eat or drink for 6 hours before the procedure or as told by your doctor. °· Make plans to have someone drive you home after the procedure. Do not drive yourself home. °· An IV tube will be put in your arm. °PROCEDURE °· You will be given a medicine to help you relax (sedative). It will be given through the IV tube. °· A numbing medicine will be sprayed or gargled in the back of your throat to help numb it. °· The tip of the probe is placed into the back of your mouth. You will be asked to swallow. This helps to pass the probe into your esophagus. °· Once the tip of the probe is in the right place, your doctor can take pictures of your heart. °· You may feel pressure at the back of your throat. °AFTER THE PROCEDURE °· You will be taken to a recovery area so the sedative can wear off. °· Your throat may be sore and scratchy. This will go away slowly over time. °· You will go home when you are fully awake and able to swallow liquids. °· You should have someone stay with you for the next 24 hours. °· Do not drive or operate machinery for the next 24 hours. °Document Released: 08/22/2009 Document Revised: 10/30/2013 Document Reviewed: 04/26/2013 °Exit[employer]® Patient Information ©2015 [employer]. This information is not intended to replace advice given to you by your health care provider. Make  sure you discuss any questions you have with your health care provider. ° °

## 2014-11-11 NOTE — CV Procedure (Addendum)
    PROCEDURE NOTE:  Procedure:  Transesophageal echocardiogram Operator:  Fransico Him, MD Indications: atrial fibrillation Complications:None IV Meds: Versed 4mg , Fentanyl 16mcg IV  Results: Normal LV size and function Normal RV size and function Normal RA Normal LV and LAA Normal TV with mildTR Normal PV with mild PR Normal MV with mild MR Normal trileaflet AV with trivial AR Very small PFO by agitated saline contrast injection Mild atherosclerosis of the thoracic and ascending aorta.  Mildly dilated aortic root  The patient tolerated the procedure well without complications  Signed: Fransico Him, MD [facility] 11/11/2014

## 2014-11-11 NOTE — Progress Notes (Signed)
Echocardiogram Echocardiogram Transesophageal has been performed.  [name] [name] 11/11/2014, 10:31 AM

## 2014-11-11 NOTE — Interval H&P Note (Signed)
History and Physical Interval Note:  11/11/2014 8:51 AM  [name] [name]  has presented today for surgery, with the diagnosis of a fib  The various methods of treatment have been discussed with the patient and family. After consideration of risks, benefits and other options for treatment, the patient has consented to  Procedure(s) with comments: TRANSESOPHAGEAL ECHOCARDIOGRAM (TEE) (N/A) - needs INR before case as a surgical intervention .  The patient's history has been reviewed, patient examined, no change in status, stable for surgery.  I have reviewed the patient's chart and labs.  Questions were answered to the patient's satisfaction.     [name]

## 2014-11-12 ENCOUNTER — Encounter (HOSPITAL_COMMUNITY)
Admission: RE | Disposition: A | Discharge status: Home / Self Care | Payer: Self-pay | Source: Ambulatory Visit | Attending: Internal Medicine

## 2014-11-12 ENCOUNTER — Ambulatory Visit (HOSPITAL_BASED_OUTPATIENT_CLINIC_OR_DEPARTMENT_OTHER)
Admission: RE | Admit: 2014-11-12 | Discharge: 2014-11-14 | Disposition: A | Discharge status: Home / Self Care | Payer: Medicare Other | Source: Ambulatory Visit | Attending: Internal Medicine | Admitting: Internal Medicine

## 2014-11-12 ENCOUNTER — Encounter (HOSPITAL_COMMUNITY): Admission: RE | Payer: Self-pay | Source: Ambulatory Visit

## 2014-11-12 ENCOUNTER — Ambulatory Visit (HOSPITAL_COMMUNITY): Payer: Medicare Other | Admitting: Anesthesiology

## 2014-11-12 ENCOUNTER — Ambulatory Visit (HOSPITAL_COMMUNITY): Admission: RE | Admit: 2014-11-12 | Payer: Medicare Other | Source: Ambulatory Visit | Admitting: Cardiovascular Disease

## 2014-11-12 ENCOUNTER — Encounter (HOSPITAL_COMMUNITY): Payer: Self-pay | Admitting: Cardiology

## 2014-11-12 DIAGNOSIS — M549 Dorsalgia, unspecified: Secondary | ICD-10-CM | POA: Diagnosis not present

## 2014-11-12 DIAGNOSIS — I4891 Unspecified atrial fibrillation: Secondary | ICD-10-CM | POA: Diagnosis not present

## 2014-11-12 DIAGNOSIS — I700 Atherosclerosis of aorta: Secondary | ICD-10-CM | POA: Diagnosis not present

## 2014-11-12 DIAGNOSIS — I2510 Atherosclerotic heart disease of native coronary artery without angina pectoris: Secondary | ICD-10-CM | POA: Diagnosis not present

## 2014-11-12 DIAGNOSIS — E785 Hyperlipidemia, unspecified: Secondary | ICD-10-CM | POA: Diagnosis not present

## 2014-11-12 DIAGNOSIS — Z7901 Long term (current) use of anticoagulants: Secondary | ICD-10-CM | POA: Diagnosis not present

## 2014-11-12 DIAGNOSIS — K219 Gastro-esophageal reflux disease without esophagitis: Secondary | ICD-10-CM | POA: Diagnosis not present

## 2014-11-12 DIAGNOSIS — I480 Paroxysmal atrial fibrillation: Secondary | ICD-10-CM | POA: Diagnosis not present

## 2014-11-12 DIAGNOSIS — I10 Essential (primary) hypertension: Secondary | ICD-10-CM | POA: Diagnosis not present

## 2014-11-12 HISTORY — PX: ATRIAL FIBRILLATION ABLATION: SHX5456

## 2014-11-12 HISTORY — DX: Atherosclerotic heart disease of native coronary artery without angina pectoris: I25.10

## 2014-11-12 LAB — PROTIME-INR
INR: 2.42 — ABNORMAL HIGH (ref 0.00–1.49)
Prothrombin Time: 26.6 seconds — ABNORMAL HIGH (ref 11.6–15.2)

## 2014-11-12 LAB — POCT ACTIVATED CLOTTING TIME
ACTIVATED CLOTTING TIME: 171 s
Activated Clotting Time: 202 seconds
Activated Clotting Time: 196 seconds

## 2014-11-12 LAB — MRSA PCR SCREENING: MRSA by PCR: NEGATIVE

## 2014-11-12 SURGERY — ECHOCARDIOGRAM, TRANSESOPHAGEAL
Anesthesia: Moderate Sedation

## 2014-11-12 SURGERY — ATRIAL FIBRILLATION ABLATION
Anesthesia: General

## 2014-11-12 MED ORDER — LATANOPROST 0.005 % OP SOLN
1.0000 [drp] | Freq: Every day | OPHTHALMIC | Status: DC
  Administered 2014-11-12 – 2014-11-13 (×2): 1 [drp] via OPHTHALMIC
  Filled 2014-11-12: qty 2.5

## 2014-11-12 MED ORDER — FENTANYL CITRATE 0.05 MG/ML IJ SOLN
INTRAMUSCULAR | Status: AC
  Filled 2014-11-12: qty 2

## 2014-11-12 MED ORDER — PROMETHAZINE HCL 25 MG/ML IJ SOLN: 6.2500 mg | INTRAMUSCULAR | Status: DC | PRN

## 2014-11-12 MED ORDER — HYDROCODONE-ACETAMINOPHEN 5-325 MG PO TABS
1.0000 | ORAL_TABLET | ORAL | Status: DC | PRN
  Administered 2014-11-12: 2 via ORAL
  Administered 2014-11-12 – 2014-11-13 (×2): 1 via ORAL
  Filled 2014-11-12: qty 1
  Filled 2014-11-12 (×2): qty 2

## 2014-11-12 MED ORDER — ACETAMINOPHEN 325 MG PO TABS: 650.0000 mg | ORAL_TABLET | ORAL | Status: DC | PRN

## 2014-11-12 MED ORDER — PROTAMINE SULFATE 10 MG/ML IV SOLN
INTRAVENOUS | Status: DC | PRN
  Administered 2014-11-12: 30 mg via INTRAVENOUS

## 2014-11-12 MED ORDER — MIDAZOLAM HCL 2 MG/2ML IJ SOLN
INTRAMUSCULAR | Status: AC
  Filled 2014-11-12: qty 2

## 2014-11-12 MED ORDER — HEPARIN SODIUM (PORCINE) 1000 UNIT/ML IJ SOLN
INTRAMUSCULAR | Status: AC
  Filled 2014-11-12: qty 1

## 2014-11-12 MED ORDER — HEPARIN SODIUM (PORCINE) 1000 UNIT/ML IJ SOLN
INTRAMUSCULAR | Status: DC | PRN
  Administered 2014-11-12: 3000 [IU] via INTRAVENOUS
  Administered 2014-11-12: 10000 [IU] via INTRAVENOUS
  Administered 2014-11-12: 2000 [IU] via INTRAVENOUS

## 2014-11-12 MED ORDER — LACTATED RINGERS IV SOLN
INTRAVENOUS | Status: DC | PRN
  Administered 2014-11-12: 08:00:00 via INTRAVENOUS

## 2014-11-12 MED ORDER — FENTANYL CITRATE 0.05 MG/ML IJ SOLN
25.0000 ug | Freq: Once | INTRAMUSCULAR | Status: AC
  Administered 2014-11-12: 25 ug via INTRAVENOUS

## 2014-11-12 MED ORDER — ONDANSETRON HCL 4 MG/2ML IJ SOLN
4.0000 mg | Freq: Four times a day (QID) | INTRAMUSCULAR | Status: DC | PRN
  Administered 2014-11-12: 4 mg via INTRAVENOUS
  Filled 2014-11-12: qty 2

## 2014-11-12 MED ORDER — MIDAZOLAM HCL 2 MG/2ML IJ SOLN
1.0000 mg | Freq: Once | INTRAMUSCULAR | Status: AC
Start: 2014-11-12 — End: 2014-11-12
  Administered 2014-11-12: 1 mg via INTRAVENOUS

## 2014-11-12 MED ORDER — MIDAZOLAM HCL 2 MG/2ML IJ SOLN
2.0000 mg | Freq: Once | INTRAMUSCULAR | Status: AC
  Administered 2014-11-12: 2 mg via INTRAVENOUS

## 2014-11-12 MED ORDER — HYDROMORPHONE HCL 2 MG/ML IJ SOLN
0.5000 mg | INTRAMUSCULAR | Status: DC | PRN
Start: 2014-11-12 — End: 2014-11-12

## 2014-11-12 MED ORDER — WARFARIN SODIUM 5 MG PO TABS
5.0000 mg | ORAL_TABLET | Freq: Once | ORAL | Status: DC
  Filled 2014-11-12: qty 1

## 2014-11-12 MED ORDER — ARTIFICIAL TEARS OP OINT
TOPICAL_OINTMENT | OPHTHALMIC | Status: DC | PRN
  Administered 2014-11-12: 1 via OPHTHALMIC

## 2014-11-12 MED ORDER — PROPOFOL 10 MG/ML IV BOLUS
INTRAVENOUS | Status: DC | PRN
  Administered 2014-11-12: 150 mg via INTRAVENOUS
  Administered 2014-11-12: 30 mg via INTRAVENOUS
  Administered 2014-11-12 (×2): 10 mg via INTRAVENOUS

## 2014-11-12 MED ORDER — HYDROMORPHONE HCL 2 MG/ML IJ SOLN
1.0000 mg | INTRAMUSCULAR | Status: DC | PRN
  Administered 2014-11-12: 1 mg via INTRAVENOUS

## 2014-11-12 MED ORDER — LIDOCAINE HCL (CARDIAC) 20 MG/ML IV SOLN
INTRAVENOUS | Status: DC | PRN
  Administered 2014-11-12: 50 mg via INTRAVENOUS

## 2014-11-12 MED ORDER — FENTANYL CITRATE 0.05 MG/ML IJ SOLN: 25.0000 ug | INTRAMUSCULAR | Status: DC | PRN

## 2014-11-12 MED ORDER — FENTANYL CITRATE 0.05 MG/ML IJ SOLN
25.0000 ug | INTRAMUSCULAR | Status: DC | PRN
  Administered 2014-11-12 (×4): 25 ug via INTRAVENOUS

## 2014-11-12 MED ORDER — DOBUTAMINE IN D5W 4-5 MG/ML-% IV SOLN
INTRAVENOUS | Status: AC
  Filled 2014-11-12: qty 250

## 2014-11-12 MED ORDER — HYDROMORPHONE HCL 1 MG/ML IJ SOLN: 0.5000 mg | INTRAMUSCULAR | Status: DC | PRN

## 2014-11-12 MED ORDER — WARFARIN - PHYSICIAN DOSING INPATIENT
Freq: Every day | Status: DC
  Administered 2014-11-12 – 2014-11-13 (×2)

## 2014-11-12 MED ORDER — TAMSULOSIN HCL 0.4 MG PO CAPS
0.4000 mg | ORAL_CAPSULE | Freq: Every evening | ORAL | Status: DC
  Administered 2014-11-12 – 2014-11-13 (×2): 0.4 mg via ORAL
  Filled 2014-11-12 (×3): qty 1

## 2014-11-12 MED ORDER — ONDANSETRON HCL 4 MG/2ML IJ SOLN
INTRAMUSCULAR | Status: DC | PRN
  Administered 2014-11-12: 4 mg via INTRAVENOUS

## 2014-11-12 MED ORDER — SODIUM CHLORIDE 0.9 % IV SOLN: 250.0000 mL | INTRAVENOUS | Status: DC | PRN

## 2014-11-12 MED ORDER — BUPIVACAINE HCL (PF) 0.25 % IJ SOLN
INTRAMUSCULAR | Status: AC
  Filled 2014-11-12: qty 30

## 2014-11-12 MED ORDER — FENTANYL CITRATE 0.05 MG/ML IJ SOLN
50.0000 ug | Freq: Once | INTRAMUSCULAR | Status: AC
  Administered 2014-11-12: 50 ug via INTRAVENOUS

## 2014-11-12 MED ORDER — DOBUTAMINE IN D5W 4-5 MG/ML-% IV SOLN
INTRAVENOUS | Status: DC | PRN
  Administered 2014-11-12: 20 ug/kg/min via INTRAVENOUS

## 2014-11-12 MED ORDER — MEPERIDINE HCL 25 MG/ML IJ SOLN: 6.2500 mg | INTRAMUSCULAR | Status: DC | PRN

## 2014-11-12 MED ORDER — SODIUM CHLORIDE 0.9 % IJ SOLN
3.0000 mL | Freq: Two times a day (BID) | INTRAMUSCULAR | Status: DC
  Administered 2014-11-12 – 2014-11-13 (×2): 3 mL via INTRAVENOUS

## 2014-11-12 MED ORDER — FENTANYL CITRATE 0.05 MG/ML IJ SOLN
INTRAMUSCULAR | Status: DC | PRN
  Administered 2014-11-12: 25 ug via INTRAVENOUS
  Administered 2014-11-12: 50 ug via INTRAVENOUS
  Administered 2014-11-12: 25 ug via INTRAVENOUS
  Administered 2014-11-12 (×2): 50 ug via INTRAVENOUS

## 2014-11-12 MED ORDER — SODIUM CHLORIDE 0.9 % IJ SOLN: 3.0000 mL | INTRAMUSCULAR | Status: DC | PRN

## 2014-11-12 MED ORDER — HYDROMORPHONE HCL 1 MG/ML IJ SOLN
INTRAMUSCULAR | Status: AC
  Filled 2014-11-12: qty 1

## 2014-11-12 NOTE — Anesthesia Postprocedure Evaluation (Signed)
  Anesthesia Post-op Note  Patient: [name] [name]  Procedure(s) Performed: Procedure(s): ATRIAL FIBRILLATION ABLATION (N/A)  Patient Location: PACU and Cath Lab  Anesthesia Type:General  Level of Consciousness: awake  Airway and Oxygen Therapy: Patient Spontanous Breathing and Patient connected to nasal cannula oxygen  Post-op Pain: mild  Post-op Assessment: Post-op Vital signs reviewed, Respiratory Function Stable and Patent Airway  Post-op Vital Signs: Reviewed and stable  Last Vitals:  Filed Vitals:   11/12/14 0542  BP: 177/74  Pulse: 63  Temp: 36.4 C  Resp: 18    Complications: No apparent anesthesia complications

## 2014-11-12 NOTE — Progress Notes (Signed)
ACT 171

## 2014-11-12 NOTE — Anesthesia Preprocedure Evaluation (Addendum)
Anesthesia Evaluation  Patient identified by MRN, date of birth, ID band Patient awake    Reviewed: Allergy & Precautions, NPO status , Patient's Chart, lab work & pertinent test results, reviewed documented beta blocker date and time   Airway Mallampati: II   Neck ROM: Full    Dental  (+) Teeth Intact   Pulmonary  breath sounds clear to auscultation        Cardiovascular hypertension, Pt. on medications + dysrhythmias Atrial Fibrillation Rhythm:Irregular  HX of sporadic AF, ECHO 11/11/2014 EF 65%, no thrombus   Neuro/Psych    GI/Hepatic GERD-  Medicated,  Endo/Other    Renal/GU Renal InsufficiencyRenal diseaseGFR 72     Musculoskeletal   Abdominal (+)  Abdomen: soft.    Peds  Hematology   Anesthesia Other Findings   Reproductive/Obstetrics                           Anesthesia Physical Anesthesia Plan  ASA: III  Anesthesia Plan: MAC   Post-op Pain Management:    Induction: Intravenous  Airway Management Planned: Nasal Cannula  Additional Equipment:   Intra-op Plan:   Post-operative Plan:   Informed Consent: I have reviewed the patients History and Physical, chart, labs and discussed the procedure including the risks, benefits and alternatives for the proposed anesthesia with the patient or authorized representative who has indicated his/her understanding and acceptance.     Plan Discussed with:   Anesthesia Plan Comments:         Anesthesia Quick Evaluation

## 2014-11-12 NOTE — Progress Notes (Signed)
Utilization Review Completed.Donne Anon T1/03/2015

## 2014-11-12 NOTE — Progress Notes (Signed)
Dr. [name] called and made aware that manual pressure continues. When manual pressure released, hematoma startes to grow. Order taken for fem-stop. Also received new order for IV pain med. Fem-stop applied rt groin, 166mm Hg. 3+ pa;pable dp. Deep blue bruising medial to stick sites.

## 2014-11-12 NOTE — Transfer of Care (Signed)
Immediate Anesthesia Transfer of Care Note  Patient: [name] [name]  Procedure(s) Performed: Procedure(s): ATRIAL FIBRILLATION ABLATION (N/A)  Patient Location: PACU  Anesthesia Type:General  Level of Consciousness: awake, alert , oriented and sedated  Airway & Oxygen Therapy: Patient Spontanous Breathing and Patient connected to nasal cannula oxygen  Post-op Assessment: Report given to PACU RN, Post -op Vital signs reviewed and stable and Patient moving all extremities  Post vital signs: Reviewed and stable  Complications: No apparent anesthesia complications

## 2014-11-12 NOTE — Progress Notes (Signed)
Dr. [name] by to see patient. Family in to see. Rt groin level 2. Hematoma above stick sites approx 5 inches wide, partially resolved. Rt dp 3+. Pain level 4/10

## 2014-11-12 NOTE — Progress Notes (Signed)
Hematoma growing above stick site. Femstop removed and manual pressure being held. Dr. [name] made aware and by to see patient.

## 2014-11-12 NOTE — Progress Notes (Signed)
Manual held for another 40 minutes. Hematoma partially resolved. Femstop placed back on Rt Groin at Cinch Pressure. Will continue to monitor patient. VSS.

## 2014-11-12 NOTE — Progress Notes (Signed)
Rt groin venous sheaths removed by Nydia Bouton. Dr. [name] aware of hematoma. Manual pressure being held. Orders received for pain med.

## 2014-11-12 NOTE — Progress Notes (Addendum)
Patient continued to complain of Groin pain. Patient started to develop a hematoma above the sheath insertion site. Paging Dr Joylene Grapes at this time to inform him of situation and to get orders for pain medication. Hematoma is the size of a tennis ball.. Waiting to hear back from Doctor at this time.

## 2014-11-12 NOTE — Op Note (Signed)
SURGEON:  [name], MD  PREPROCEDURE DIAGNOSES: 1. Paroxysmal atrial fibrillation.  POSTPROCEDURE DIAGNOSES: 1. Paroxysmal  atrial fibrillation.  PROCEDURES: 1. Comprehensive electrophysiologic study. 2. Coronary sinus pacing and recording. 3. Three-dimensional mapping of atrial fibrillation with additional mapping and ablation of a second focus within the right atrium 4. Ablation of atrial fibrillation with additional mapping and ablation of a second discrete focus within the right atrium 5. Intracardiac echocardiography. 6. Transseptal puncture of an intact septum. 7. Rotational Angiography with processing at an independent workstation 8. Arrhythmia induction with pacing with dobutamine infusion 9. External cardioversion  INTRODUCTION:  [name] [name] is a 81 y.o. male with a history of paroxysmal atrial fibrillation who now presents for EP study and radiofrequency ablation.  The patient reports initially being diagnosed with atrial fibrillation after presenting with symptomatic palpitations and fatgiue. The patient reports increasing frequency and duration of atrial fibrillation since that time.  The patient has failed medical therapy with floecainide.  Additional AAD options are limited by sinus bradycardia.  The patient therefore presents today for catheter ablation of atrial fibrillation.  DESCRIPTION OF PROCEDURE:  Informed written consent was obtained, and the patient was brought to the electrophysiology lab in a fasting state.  The patient was adequately sedated with intravenous medications as outlined in the anesthesia report.  The patient's left and right groins were prepped and draped in the usual sterile fashion by the EP lab staff.  Using a percutaneous Seldinger technique, two 7-French and one 11-French hemostasis sheaths were placed into the right common femoral vein.  3 Dimensional Rotational Angiography: A 5 french pigtail catheter was introduced through the right common  femoral vein and advanced into the inferior venocava.  3 demential rotational angiography was then performed by power injection of 100cc of nonionic contrast.  Reprocessing at an independent work station was then performed.   This demonstrated a moderate sized left atrium with 4 separate pulmonary veins which were also moderate in size.  There were no anomalous veins or significant abnormalities.  A 3 dimensional rendering of the left atrium was then merged using [employer] onto the [employer] system and registered with intracardiac echo (see below).  The pigtail catheter was then removed.  Catheter Placement:  A 7-French [employer] Decapolar coronary sinus catheter was introduced through the right common femoral vein and advanced into the coronary sinus for recording and pacing from this location.  A 6-French quadripolar Josephson catheter was introduced through the right common femoral vein and advanced into the right ventricle for recording and pacing.  This catheter was then pulled back to the His bundle location.    Initial Measurements: The patient presented to the electrophysiology lab in sinus rhythm.  His PR interval measured 172 msec with a QRS duration of 103 msec and a QT interval of 395 msec.  The AH interval measured 60 msec and the HV interval measured 45 msec.     Intracardiac Echocardiography: A 10-French [employer] Webster AcuNav intracardiac echocardiography catheter was introduced through the left common femoral vein and advanced into the right atrium. Intracardiac echocardiography was performed of the left atrium, and a three-dimensional anatomical rendering of the left atrium was performed using CARTO sound technology.  The patient was noted to have a moderate sized left atrium.  The interatrial septum was prominent but not aneurysmal. All 4 pulmonary veins were visualized and noted to have separate ostia.  The pulmonary veins were moderate in size.  The left atrial  appendage was  visualized and did not reveal thrombus.   There was no evidence of pulmonary vein stenosis.   Transseptal Puncture: The middle right common femoral vein sheath was exchanged for an 8.5 [place] SL2 transseptal sheath and transseptal access was achieved in a standard fashion using a Brockenbrough needle under biplane fluoroscopy with intracardiac echocardiography confirmation of the transseptal puncture.  Once transseptal access had been achieved, heparin was administered intravenously and intra- arterially in order to maintain an ACT of greater than 350 seconds throughout the procedure.   3D Mapping and Ablation: The His bundle catheter was removed and in its place a 3.5 mm [employer] Thermocool ablation catheter was advanced into the right atrium.  The transseptal sheath was pulled back into the IVC over a guidewire.  The ablation catheter was advanced across the transseptal hole using the wire as a guide.  The transseptal sheath was then re-advanced over the guidewire into the left atrium.  A duodecapolar [employer] Webster circular mapping catheter was introduced through the transseptal sheath and positioned over the mouth of all 4 pulmonary veins.  Three-dimensional electroanatomical mapping was performed using CARTO technology.  This demonstrated electrical activity within all four pulmonary veins at baseline. The patient underwent successful sequential electrical isolation and anatomical encircling of all four pulmonary veins using radiofrequency current with a circular mapping catheter as a guide.   The circular mapping catheter was pulled back into the right atrium and 3D mapping was performed at the junction of the superior vena cava and right atrium.  Electrical activity was observed within the SVC.  I therefore elected to perform right atrial ablation in this area.  A series of radiofrequency applications were delivered in a circular fashion around the ostium of the  SVC.  Prior to each ablation lesions, pacing was performed from the distal ablation electrode to insure that diaphragmatic stimulation was not observed to avoid phrenic nerve injury.  Diaphragmatic excursion was also observed during ablation.    Arrhythmia induction with dobutamine Following ablation, dobutamine was infused up to 20 mcg/kg/min with no inducible atrial tachycardia, atrial flutter, or sustained PACs.  With aggressive atrial pacing, the patient was converted to atrial fibrillation (pacing at 240 msec).  Cardioversion: The patient was then cardioverted to sinus rhythm with a single synchronized 200-J biphasic shock with cardioversion electrodes in the anterior-posterior thoracic configuration.  He remained in sinus rhythm thereafter.  Measurements Following Ablation: In sinus rhythm with RR interval was 1172 msec, with PR 143 msec, QRS 106 msec, and Qt 434 msec.  Following ablation the AH interval measured 55 msec with an HV interval of 46 msec. Ventricular pacing was performed, which revealed midline decremental VA conduction with a VA Wenckebach cycle length of 300 msec.  Rapid atrial pacing was performed, which revealed an AV Wenckebach cycle length of 340 msec.  Electroisolation was then again confirmed in all four pulmonary veins.  Pacing was performed along the ablation line which confirmed entrance and exit block.  The procedure was therefore considered completed.  All catheters were removed, and the sheaths were aspirated and flushed.  The patient was transferred to the recovery area for sheath removal per protocol. EBL<23ml.  A limited bedside transthoracic echocardiogram revealed no pericardial effusion.  There were no early apparent complications.  CONCLUSIONS: 1. Sinus rhythm upon presentation.   2. Rotational Angiography reveals a moderate sized left atrium with four separate pulmonary veins without evidence of pulmonary vein stenosis. 3. Successful electrical isolation and  anatomical encircling of  all four pulmonary veins with radiofrequency current.  Additional ablation was performed at the junction of the SVC/ RA. 4. No inducible arrhythmias following ablation both on and off of dobutamine.  Afib was however induced with aggressive atrial pacing and was successfully cardioverted to sinus rhythm 5. No early apparent complications.   Trude Mcburney 10:52 AM 11/12/2014

## 2014-11-12 NOTE — Progress Notes (Signed)
Report received from [facility]. Patient resting in bed at this time. Patient states 10/10 Rt groin pain at this time. Rt groin site soft with no bleeding or hematoma noted. VSS. ACT being drawn at this time. 2+ Rt DP.

## 2014-11-12 NOTE — H&P (Signed)
Primary Care Physician: [name], MD Primary Cardiologist: Dr. Percival Spanish Primary Electrophysiologist:Dr. Nicklous Aburto Referring Physician:Dr. Kingjames Coury is a 81 y.o. male with a h/o paroxysmal/ atrial fibrillation who presents for AF ablation. The patient was initially diagnosed with atrial fibrillation in 2008 after presenting with symptoms of rapid heartbeat.He has had progressive increase in bouts of afib and recently was tried on flecainide which he did not tolerate due to dizziness/imbalance/bradycardia. He currently has a tendency for bradycardia, which may limit AAD options. He recently had metoprolol decreased due to slow heart rate. He usually does take extra metoprolol for breakthrough afib which can last up to 16 hours. Heart rate is not that rapid in afib, usually in the 90's. He will usual slow down his activities with PAF, but can still function. Does not have snoring history, no alcohol or tobacco use, exercises daily on the treadmill. BP controlled. He is appropriately anticoagulated on warfarin, without bleeding issues.  Today, he denies symptoms of palpitations, chest pain, shortness of breath, orthopnea, PND, lower extremity edema, dizziness, presyncope, syncope, snoring, daytime somnolence, bleeding, or neurologic sequela. The patient is tolerating medications without difficulties and is otherwise without complaint today.    Atrial Fibrillation Risk Factors:  he does not have symptoms or diagnosis of sleep apnea.  he does not have a history of rheumatic fever.  he does not have a history of alcohol use.  he has a BMI of Body mass index is 29.54 kg/(m^2).Marland Kitchen Filed Weights   09/25/14 1508  Weight: 214 lb 12.8 oz (97.433 kg)    LA size: 27mm   Atrial Fibrillation Management history:  Previous antiarrhythmic drugs: flecainide  Previous cardioversions: none  Previous ablations:none  CHADS2VASC score:at least 3 This patients CHA2DS2-VASc Score  and unadjusted Ischemic Stroke Rate (% per year) is equal to 3.2 % stroke rate/year from a score of 3  Above score calculated as 1 point each if present [CHF, HTN, DM, Vascular=MI/PAD/Aortic Plaque, Age if 65-74, or Male] Above score calculated as 2 points each if present [Age > 75, or Stroke/TIA/TE]   Anticoagulation history: warfarin   Past Medical History  Diagnosis Date  . Allergic rhinitis   . Hyperlipidemia   . HTN (hypertension)   . Low back pain   . Paroxysmal atrial fibrillation     sees Dr. Percival Spanish   . GERD (gastroesophageal reflux disease)   . Glaucoma   . Hx of colonic polyps     tubular adenoma   . Nephrolithiasis   . Hemorrhoids     internal  . Pancolonic diverticulosis   . Cancer     FACIAL MELONOMA  . Cataract     REMOVED  . Kidney stone    Past Surgical History  Procedure Laterality Date  . Cholecystectomy  02/1990    Dr. [name]  . Vasectomy    . Right inguinal hernia repair  04/1990    Dr. [name]  . Refractive surgery    . Ruptured blood vessel in the bladder    . Removed 2 stones from left ureter    . Blood vessel repair in bladder    . Colonoscopy  06-29-12    per Dr. [name], adenomatous polyps, repeat in 3 yrs     Current Outpatient Prescriptions  Medication Sig Dispense Refill  . acetaminophen (TYLENOL) 325 MG tablet Take 325-650 mg by mouth daily.     . cetirizine (ZYRTEC) 10 MG tablet Take 10 mg by mouth every morning.     Marland Kitchen  latanoprost (XALATAN) 0.005 % ophthalmic solution Place 1 drop into both eyes at bedtime.     Marland Kitchen lisinopril (PRINIVIL,ZESTRIL) 10 MG tablet Take 10 mg by mouth at bedtime.     Marland Kitchen loratadine (CLARITIN) 10 MG tablet Take 10 mg by mouth at bedtime.     . metoprolol tartrate (LOPRESSOR) 25 MG tablet Take 25 mg by mouth 2 (two) times daily.    . mometasone (NASONEX) 50  MCG/ACT nasal spray Place 2 sprays into both nostrils daily as needed (allergies).     . Multiple Vitamins-Minerals (CENTRUM SILVER PO) Take 1 tablet by mouth every morning.     Marland Kitchen omeprazole (PRILOSEC) 20 MG capsule Take 20 mg by mouth 2 (two) times daily.     . potassium chloride (KLOR-CON) 10 MEQ CR tablet Take 10 mEq by mouth every morning.     . Psyllium (METAMUCIL PO) Take 30 mLs by mouth 2 (two) times daily.     . simvastatin (ZOCOR) 40 MG tablet Take 40 mg by mouth at bedtime.     . tamsulosin (FLOMAX) 0.4 MG CAPS Take 0.4 mg by mouth every evening.     . warfarin (COUMADIN) 5 MG tablet Take 1 tablet by mouth daily or as directed by coumadin clinic 30 tablet 5   No current facility-administered medications for this visit.    Allergies  Allergen Reactions  . Sulfa Antibiotics Other (See Comments)    Bad problem on groin area  . Sulfamethoxazole     REACTION: rash    History   Social History  . Marital Status: Married    Spouse Name: N/A    Number of Children: 2  . Years of Education: N/A   Occupational History  . Retired    Social History Main Topics  . Smoking status: Never Smoker   . Smokeless tobacco: Never Used  . Alcohol Use: No  . Drug Use: No  . Sexual Activity: Not on file   Other Topics Concern  . Not on file   Social History Narrative    Family History  Problem Relation Age of Onset  . Heart attack Father 64  . Heart disease Father   . Colon cancer Neg Hx   . Ovarian cancer Mother    The patient does not have a history of early familial atrial fibrillation or other arrhythmias.  ROS- All systems are reviewed and negative except as per the HPI above.  Physical Exam: Filed Vitals:   11/12/14 0542  BP: 177/74  Pulse: 63  Temp: 97.6 F (36.4 C)  Resp: 18    GEN- The patient is well appearing, alert and  oriented x 3 today.  Head- normocephalic, atraumatic Eyes- Sclera clear, conjunctiva pink Ears- hearing intact Oropharynx- clear Neck- supple, no JVP Lymph- no cervical lymphadenopathy Lungs- Clear to ausculation bilaterally, normal work of breathing Heart- Regular rate and rhythm, no murmurs, rubs or gallops, PMI not laterally displaced GI- soft, NT, ND, + BS Extremities- no clubbing, cyanosis, or edema MS- no significant deformity or atrophy Skin- no rash or lesion Psych- euthymic mood, full affect Neuro- strength and sensation are intact   Echo- Left ventricle: The cavity size was normal. There was mild concentric hypertrophy. Systolic function was vigorous. The estimated ejection fraction was in the range of 65% to 70%. Wall motion was normal; there were no regional wall motion abnormalities. Left ventricular diastolic function parameters were normal. - Aortic valve: There was no regurgitation. - Aorta: The aorta was normal, not  dilated, and non-diseased. - Mitral valve: There was mild regurgitation. - Left atrium: The atrium was normal in size. - Right ventricle: Systolic function was normal. - Right atrium: The atrium was normal in size. - Tricuspid valve: There was no regurgitation. - Pulmonic valve: There was no regurgitation. - Pulmonary arteries: The main pulmonary artery was normal-sized. PA peak pressure: 33 mm Hg (S). - Inferior vena cava: The vessel was normal in size. The respirophasic diameter changes were in the normal range (= 50%), consistent with normal central venous pressure.     Ref Range 79mo ago    Sodium 135 - 145 mEq/L 138   Potassium 3.5 - 5.1 mEq/L 4.5   Chloride 96 - 112 mEq/L 100   CO2 19 - 32 mEq/L 31   Glucose, Bld 70 - 99 mg/dL 98   BUN 6 - 23 mg/dL 28 (H)   Creatinine, Ser 0.4 - 1.5 mg/dL 1.3   Calcium 8.4 - 10.5 mg/dL 9.7   GFR >60.00 mL/min 56.51 (L)      TEE  is reviewed  Assessment and Plan:  1. Atrial fibrillation  The patient has Symptomatic paroxysmal atrial fibrillation. The patients CHAD2VASC score is at least 3. he is appropriately anticoagulated at this time. The patient is adequately rate controlled with metoprolol tartrate.. Antiarrhythmic therapy to dates has included flecainide.  The patients left atrial size is 36 mm. Additional echo findings include LVH with EF 65-70%   A long discussion with the patient was had today regarding therapeutic strategies.     Recommendations:   Therapeutic strategies for afib including medicine and ablation were discussed in detail with the patient today. Risk, benefits, and alternatives to EP study and radiofrequency ablation for afib were also discussed in detail today. These risks include but are not limited to stroke, bleeding, vascular damage, tamponade, perforation, damage to the esophagus, lungs, and other structures, pulmonary vein stenosis, worsening renal function, radiation exposure and death. The patient understands these risk and wishes to proceed.

## 2014-11-12 NOTE — Progress Notes (Signed)
Site area: rt groin Site Prior to Removal:  Level 1 hematoma Pressure Applied For: 40 minutes. Manual:   yes Patient Status During Pull:  Hemodynamically stable. Post Pull Site:  Level 2;bruising;  fem stop applied to rt groin(see note) Post Pull Instructions Given:  yes Post Pull Pulses Present: yes Dressing Applied:  femstop Bedrest begins @ 1410 Comments: Level 2 hematoma. Controlled hemostasis w/fem stop. Medicated for pain.

## 2014-11-13 DIAGNOSIS — K219 Gastro-esophageal reflux disease without esophagitis: Secondary | ICD-10-CM | POA: Diagnosis not present

## 2014-11-13 DIAGNOSIS — E785 Hyperlipidemia, unspecified: Secondary | ICD-10-CM | POA: Diagnosis not present

## 2014-11-13 DIAGNOSIS — I480 Paroxysmal atrial fibrillation: Secondary | ICD-10-CM | POA: Diagnosis not present

## 2014-11-13 DIAGNOSIS — Z7901 Long term (current) use of anticoagulants: Secondary | ICD-10-CM | POA: Diagnosis not present

## 2014-11-13 DIAGNOSIS — I10 Essential (primary) hypertension: Secondary | ICD-10-CM | POA: Diagnosis not present

## 2014-11-13 DIAGNOSIS — I700 Atherosclerosis of aorta: Secondary | ICD-10-CM | POA: Diagnosis not present

## 2014-11-13 LAB — BASIC METABOLIC PANEL
Anion gap: 9 (ref 5–15)
BUN: 14 mg/dL (ref 6–23)
CALCIUM: 8.4 mg/dL (ref 8.4–10.5)
CO2: 27 mmol/L (ref 19–32)
Chloride: 103 mEq/L (ref 96–112)
Creatinine, Ser: 1.05 mg/dL (ref 0.50–1.35)
GFR calc non Af Amer: 64 mL/min — ABNORMAL LOW (ref >90)
GFR, EST AFRICAN AMERICAN: 74 mL/min — AB (ref >90)
Glucose, Bld: 132 mg/dL — ABNORMAL HIGH (ref 70–99)
Potassium: 4.1 mmol/L (ref 3.5–5.1)
Sodium: 139 mmol/L (ref 135–145)

## 2014-11-13 LAB — CBC
HEMATOCRIT: 34.2 % — AB (ref 39.0–52.0)
HEMOGLOBIN: 11.3 g/dL — AB (ref 13.0–17.0)
MCH: 32.6 pg (ref 26.0–34.0)
MCHC: 33 g/dL (ref 30.0–36.0)
MCV: 98.6 fL (ref 78.0–100.0)
Platelets: 171 10*3/uL (ref 150–400)
RBC: 3.47 MIL/uL — ABNORMAL LOW (ref 4.22–5.81)
RDW: 14.3 % (ref 11.5–15.5)
WBC: 11.5 10*3/uL — AB (ref 4.0–10.5)

## 2014-11-13 LAB — POCT ACTIVATED CLOTTING TIME
ACTIVATED CLOTTING TIME: 300 s
Activated Clotting Time: 263 seconds
Activated Clotting Time: 300 seconds

## 2014-11-13 LAB — PROTIME-INR
INR: 2.53 — AB (ref 0.00–1.49)
Prothrombin Time: 27.5 seconds — ABNORMAL HIGH (ref 11.6–15.2)

## 2014-11-13 MED ORDER — WARFARIN SODIUM 2 MG PO TABS
2.0000 mg | ORAL_TABLET | Freq: Once | ORAL | Status: AC
  Administered 2014-11-13: 2 mg via ORAL
  Filled 2014-11-13 (×2): qty 1

## 2014-11-13 NOTE — Progress Notes (Signed)
Patient has femostop to rt groin removed at 2200. Prior to removal at 2130 pressure was reduced to 45 mmHg. Groin site unchanged. Marked bruising present to groin , scrotum and inner aspect of right thigh. Those areas were marked with washable marker. Distal pulse present. Patient only reports low back pain related to laying flat in bed. See MAR.  At 2200- area was assessed and unchanged from previous documentation. Instructions given to patient to hold area if he was to do anything to increase abdominal pressure , like coughing and to report any new pain, wetness to the nurse immediately. Patient was able to demonstrate where to hold and verbalized in his own words the instructions given.

## 2014-11-13 NOTE — Progress Notes (Signed)
Patient Profile: 81 y.o. male with a h/o paroxysmal atrial fibrillation who presents for AF ablation.  Subjective: Still with mild right anterior groin pain. No severe low back or flank pain.   Objective: Vital signs in last 24 hours: Temp:  [97.7 F (36.5 C)-98.6 F (37 C)] 97.7 F (36.5 C) (01/06 0351) Pulse Rate:  [48-61] 56 (01/06 0700) Resp:  [11-29] 13 (01/06 0700) BP: (112-162)/(36-90) 130/42 mmHg (01/06 0700) SpO2:  [95 %-100 %] 95 % (01/06 0700) Weight:  [211 lb 10.3 oz (96 kg)] 211 lb 10.3 oz (96 kg) (01/05 1700) Last BM Date: 11/12/14  Intake/Output from previous day: 01/05 0701 - 01/06 0700 In: 1360 [P.O.:360; I.V.:1000] Out: 475 [Urine:450; Blood:25] Intake/Output this shift:    Medications Current Facility-Administered Medications  Medication Dose Route Frequency Provider Last Rate Last Dose  . 0.9 %  sodium chloride infusion  250 mL Intravenous PRN [name], MD      . acetaminophen (TYLENOL) tablet 650 mg  650 mg Oral Q4H PRN [name], MD      . fentaNYL (SUBLIMAZE) injection 25 mcg  25 mcg Intravenous Q30 min PRN [name], MD   25 mcg at 11/12/14 1735  . HYDROcodone-acetaminophen (NORCO/VICODIN) 5-325 MG per tablet 1-2 tablet  1-2 tablet Oral Q4H PRN [name], MD   2 tablet at 11/12/14 2308  . HYDROmorphone (DILAUDID) injection 0.5 mg  0.5 mg Intravenous Q2H PRN [name], MD      . latanoprost (XALATAN) 0.005 % ophthalmic solution 1 drop  1 drop Both Eyes QHS [name], MD   1 drop at 11/12/14 2139  . ondansetron (ZOFRAN) injection 4 mg  4 mg Intravenous Q6H PRN [name], MD   4 mg at 11/12/14 1615  . sodium chloride 0.9 % injection 3 mL  3 mL Intravenous Q12H [name], MD   3 mL at 11/12/14 2139  . sodium chloride 0.9 % injection 3 mL  3 mL Intravenous PRN [name], MD      . tamsulosin (FLOMAX) capsule 0.4 mg  0.4 mg Oral QPM [name], MD   0.4 mg at 11/12/14 1910  . Warfarin - Physician Dosing Inpatient   Does not  apply q1800 [name], MD        PE: General appearance: alert, cooperative and no distress Neck: no carotid bruit and no JVD Lungs: clear to auscultation bilaterally Heart: regular rate and rhythm, S1, S2 normal, no murmur, click, rub or gallop Extremities: no LEE; right groin with mild ecchymosis around access site, area is soft w/o bruit. Pulses: 2+ and symmetric Skin: warm and dry Neurologic: Grossly normal  Lab Results:   Recent Labs  11/13/14 0230  WBC 11.5*  HGB 11.3*  HCT 34.2*  PLT 171   BMET  Recent Labs  11/13/14 0230  NA 139  K 4.1  CL 103  CO2 27  GLUCOSE 132*  BUN 14  CREATININE 1.05  CALCIUM 8.4   PT/INR  Recent Labs  11/11/14 0845 11/12/14 0545 11/13/14 0230  LABPROT 27.6* 26.6* 27.5*  INR 2.54* 2.42* 2.53*   Cholesterol No results for input(s): CHOL in the last 72 hours. Cardiac Enzymes Invalid input(s): TROPONIN,  CKMB  Studies/Results:  EP Study/ AF Ablation 11/12/14 CONCLUSIONS: 1. Sinus rhythm upon presentation.  2. Rotational Angiography reveals a moderate sized left atrium with four separate pulmonary veins without evidence of pulmonary vein stenosis. 3. Successful electrical isolation and anatomical encircling of all four pulmonary veins with radiofrequency  current. Additional ablation was performed at the junction of the SVC/ RA. 4. No inducible arrhythmias following ablation both on and off of dobutamine. Afib was however induced with aggressive atrial pacing and was successfully cardioverted to sinus rhythm 5. No early apparent complications.    Assessment/Plan  Active Problems:   Atrial fibrillation   Paroxysmal atrial fibrillation  1. PAF: Day 1 s/p EP study and successful AF ablation. No inducible arrhthymias following ablation. He continues in SR/sinus brady with rates in the upper 50s-low 60s. Continue warfarin for a/c. INR is therapeutic at 2.53.   2. Right groin hematoma: complication from EP study/  ablation which required femoral access. Compression was held last PM. The area is now soft w/o audible bruit. He continues to have mild anterior groin tenderness but denies severe pain. No low back or flank pain. He has 2+ DPs. Hgb is stable at 11.3. BP stable in the 330Q systolic. Will have patient ambulate the halls today. If access site remains stable will plan for discharge home later today.     LOS: 1 day    Brittainy M. Rosita Fire, PA-C 11/13/2014 7:35 AM  I have seen, examined the patient, and reviewed the above assessment and plan.  Changes to above are made where necessary.  Groin is soft but tender. Will ambulate today.  Transfer to telemetry.  He does not feel that he wants to go home today.  Anticipate discharge tomorrow Will give coumadin 2.5mg  tonight.  Co Sign: [name], MD 11/13/2014 8:23 AM

## 2014-11-14 DIAGNOSIS — I700 Atherosclerosis of aorta: Secondary | ICD-10-CM | POA: Diagnosis not present

## 2014-11-14 DIAGNOSIS — Z7901 Long term (current) use of anticoagulants: Secondary | ICD-10-CM | POA: Diagnosis not present

## 2014-11-14 DIAGNOSIS — K219 Gastro-esophageal reflux disease without esophagitis: Secondary | ICD-10-CM | POA: Diagnosis not present

## 2014-11-14 DIAGNOSIS — I480 Paroxysmal atrial fibrillation: Secondary | ICD-10-CM | POA: Diagnosis not present

## 2014-11-14 DIAGNOSIS — E785 Hyperlipidemia, unspecified: Secondary | ICD-10-CM | POA: Diagnosis not present

## 2014-11-14 DIAGNOSIS — I10 Essential (primary) hypertension: Secondary | ICD-10-CM | POA: Diagnosis not present

## 2014-11-14 LAB — PROTIME-INR
INR: 2.16 — ABNORMAL HIGH (ref 0.00–1.49)
Prothrombin Time: 24.3 seconds — ABNORMAL HIGH (ref 11.6–15.2)

## 2014-11-14 MED ORDER — DOCUSATE SODIUM 100 MG PO CAPS
100.0000 mg | ORAL_CAPSULE | Freq: Every day | ORAL | Status: DC
  Administered 2014-11-14: 100 mg via ORAL
  Filled 2014-11-14: qty 1

## 2014-11-14 MED ORDER — WARFARIN SODIUM 5 MG PO TABS: 5.0000 mg | ORAL_TABLET | Freq: Once | ORAL | Status: DC

## 2014-11-14 NOTE — Progress Notes (Signed)
Patient Profile: 81 y.o. male with a h/o paroxysmal atrial fibrillation admitted for AF ablation.   Subjective: Still with mild anterior groin soreness but improved since yesterday. Ambulating ok. No other complaints.   Objective: Vital signs in last 24 hours: Temp:  [98.4 F (36.9 C)-99.2 F (37.3 C)] 98.4 F (36.9 C) (01/07 0500) Pulse Rate:  [52-69] 52 (01/07 0500) Resp:  [12-20] 18 (01/07 0500) BP: (121-163)/(41-60) 121/52 mmHg (01/07 0500) SpO2:  [94 %-98 %] 98 % (01/07 0500) Last BM Date: 11/12/14  Intake/Output from previous day: 01/06 0701 - 01/07 0700 In: 440 [P.O.:440] Out: 300 [Urine:300] Intake/Output this shift:    Medications Current Facility-Administered Medications  Medication Dose Route Frequency Provider Last Rate Last Dose  . 0.9 %  sodium chloride infusion  250 mL Intravenous PRN [name], MD      . acetaminophen (TYLENOL) tablet 650 mg  650 mg Oral Q4H PRN [name], MD      . HYDROcodone-acetaminophen (NORCO/VICODIN) 5-325 MG per tablet 1-2 tablet  1-2 tablet Oral Q4H PRN [name], MD   1 tablet at 11/13/14 2252  . HYDROmorphone (DILAUDID) injection 0.5 mg  0.5 mg Intravenous Q2H PRN [name], MD      . latanoprost (XALATAN) 0.005 % ophthalmic solution 1 drop  1 drop Both Eyes QHS [name], MD   1 drop at 11/13/14 2236  . ondansetron (ZOFRAN) injection 4 mg  4 mg Intravenous Q6H PRN [name], MD   4 mg at 11/12/14 1615  . sodium chloride 0.9 % injection 3 mL  3 mL Intravenous Q12H [name], MD   3 mL at 11/13/14 2238  . sodium chloride 0.9 % injection 3 mL  3 mL Intravenous PRN [name], MD      . tamsulosin (FLOMAX) capsule 0.4 mg  0.4 mg Oral QPM [name], MD   0.4 mg at 11/13/14 1724  . Warfarin - Physician Dosing Inpatient   Does not apply q1800 [name], MD        PE: General appearance: alert, cooperative and no distress Neck: no carotid bruit and no JVD Lungs: clear to auscultation bilaterally Heart:  regular rate and rhythm, S1, S2 normal, no murmur, click, rub or gallop Extremities: no LEE Pulses: 2+ and symmetric Skin: warm and dry Neurologic: Grossly normal  Lab Results:   Recent Labs  11/13/14 0230  WBC 11.5*  HGB 11.3*  HCT 34.2*  PLT 171   BMET  Recent Labs  11/13/14 0230  NA 139  K 4.1  CL 103  CO2 27  GLUCOSE 132*  BUN 14  CREATININE 1.05  CALCIUM 8.4   PT/INR  Recent Labs  11/12/14 0545 11/13/14 0230 11/14/14 0505  LABPROT 26.6* 27.5* 24.3*  INR 2.42* 2.53* 2.16*    Assessment/Plan    Active Problems:   Atrial fibrillation   Paroxysmal atrial fibrillation  1. PAF: Day 2 s/p EP study and successful AF ablation. No inducible arrhthymias following ablation. He continues in SR/sinus brady with rates in the upper 50s-low 60s. Continue warfarin for a/c. INR is therapeutic at 2.16.   2. Right groin hematoma: complication from EP study/ ablation which required femoral access. Stable. The area is now soft w/o audible bruit. He continues to have mild anterior groin tenderness but denies severe pain. No low back or flank pain. He has 2+ DPs. Hgb stable at 11.3. BP stable. Ambulating ok.   Dispo: Home today.      LOS: 2 days  Brittainy M. Ladoris Gene 11/14/2014 7:37 AM  I have seen, examined the patient, and reviewed the above assessment and plan.  Changes to above are made where necessary.  Resume home medicines including full dose coumadin.  Add colace.  Follow-up with NP/PA on Monday. Follow-up with me in 2-3 weeks. DC to home today.  Co Sign: [name], MD 11/14/2014 8:21 AM

## 2014-11-14 NOTE — Discharge Summary (Signed)
Physician Discharge Summary  Patient ID: [name] [name] MRN: [id number] DOB/AGE: 79-Jul-1930 79 y.o.  Admit date: 11/12/2014 Discharge date: 11/14/2014  Admission Diagnoses: Atrial Fibrillation   Discharge Diagnoses:  Active Problems:   Atrial fibrillation   Paroxysmal atrial fibrillation   Discharged Condition: stable  Hospital Course: [name] [name] is a 81 y.o. male with a h/o paroxysmal/ atrial fibrillation who was admitted to [facility] for AF ablation. The patient was initially diagnosed with atrial fibrillation in 2008 after presenting with symptoms of rapid heartbeat. He has had progressive increase in bouts of afib and recently was tried on flecainide which he did not tolerate due to dizziness/imbalance/bradycardia. His tendency for bradycardia was felt to limit other AAD options. He has been appropriately anticoagulated on warfarin, without bleeding issues. Dr. [name] recommended a comprehensive electrophysiologic study followed by Afib ablation for rhythm control.   He presented to [facility] on 11/12/14 to undergo the planned procedure. The procedure was performed by Dr. [name]. Access was obtained via the right femoral artery. Rotational angiography revealed a moderate sized left atrium with four separate pulmonary veins without evidence of pulmonary vein stenosis. He underwent successful electrical isolation and anatomical encircling of all four pulmonary veins with radiofrequency current. Additional ablation was performed at the junction of the SVC/ RA. No inducible arrhythmias following ablation both on and off of dobutamine. Afib was however induced with aggressive atrial pacing and was successfully cardioverted to sinus rhythm. He left the EP lab in stable condition. He remained in SR/Sinus brady and had no breakthrough atrial fibrillation. He was monitored post procedure. Recovery was complicated by a right groin hematoma. He noted anterior groin pain, but no flank or low back pain. Manual  pressure was applied and hemostasis was achieved. There was no further progression in size. Hgb remained stable. The decision was made to keep an additional day for further monitoring. His femoral access site remained stable. The groin remained soft w/o audible bruit. His pain improved. He had no difficulties ambulating. He was last seen and examined by Dr. [name] who determined he was stable for discharge home. His metoprolol was discontinued due to bradycardia (resting HR in the mid 50s but asymptomatic). He was continued on warfarin. Initial post hospital f/u has been arranged for 11/18/14 with Roderic Palau to assess his right groin. F/U with Dr. [name] has been arranged for 11/22/14.    Consults: None  Significant Diagnostic Studies: See full OP note for details.   Treatments: See Hospital Course  Discharge Exam: Blood pressure 121/52, pulse 52, temperature 98.4 F (36.9 C), temperature source Oral, resp. rate 18, height 6' (1.829 m), weight 211 lb 10.3 oz (96 kg), SpO2 98 %.   Disposition: 01-Home or Self Care      Discharge Instructions    Diet - low sodium heart healthy    Complete by:  As directed      Driving Restrictions    Complete by:  As directed   Avoid driving until your f/u appointment 11/18/14     Increase activity slowly    Complete by:  As directed      Lifting restrictions    Complete by:  As directed   No lifting more than 1/2 gallon of milk x 3 days            Medication List    STOP taking these medications        metoprolol tartrate 25 MG tablet  Commonly known as:  [employer]  TAKE these medications        acetaminophen 325 MG tablet  Commonly known as:  TYLENOL  Take 325 mg by mouth daily.     CENTRUM SILVER PO  Take 1 tablet by mouth every morning.     cetirizine 10 MG tablet  Commonly known as:  ZYRTEC  Take 10 mg by mouth every morning.     clarithromycin 500 MG tablet  Commonly known as:  BIAXIN  Take 1 tablet (500 mg total) by  mouth 2 (two) times daily.     HYDROcodone-homatropine 5-1.5 MG/5ML syrup  Commonly known as:  HYCODAN  Take 5 mLs by mouth every 8 (eight) hours as needed for cough.     latanoprost 0.005 % ophthalmic solution  Commonly known as:  XALATAN  Place 1 drop into both eyes at bedtime.     lisinopril 10 MG tablet  Commonly known as:  PRINIVIL,ZESTRIL  Take 10 mg by mouth at bedtime.     loratadine 10 MG tablet  Commonly known as:  CLARITIN  Take 10 mg by mouth at bedtime.     METAMUCIL PO  Take 30 mLs by mouth 2 (two) times daily.     mometasone 50 MCG/ACT nasal spray  Commonly known as:  NASONEX  Place 2 sprays into both nostrils daily as needed (allergies).     omeprazole 20 MG capsule  Commonly known as:  PRILOSEC  Take 20 mg by mouth 2 (two) times daily.     potassium chloride 10 MEQ CR tablet  Commonly known as:  KLOR-CON  Take 10 mEq by mouth every morning.     PROBIOTIC DAILY PO  Take 1 tablet by mouth daily.     simvastatin 40 MG tablet  Commonly known as:  ZOCOR  Take 40 mg by mouth at bedtime.     tamsulosin 0.4 MG Caps capsule  Commonly known as:  FLOMAX  Take 0.4 mg by mouth every evening.     warfarin 5 MG tablet  Commonly known as:  COUMADIN  Take 1 tablet by mouth daily or as directed by coumadin clinic       Follow-up Information    Follow up with CARROLL,DONNA, NP On 11/18/2014.   Specialty:  Nurse Practitioner   Why:  10:00 am (Dr. Jackalyn Lombard PA)   Contact information:   [place] [address] [phone]       Follow up with [name], MD On 11/22/2014.   Specialty:  Cardiology   Why:  2:00 pm    Contact information:   [address]  [place] [address] [phone]        TIME SPENT ON DISCHARGE, INCLUDING PHYSICIAN TIME: > 30 MINUTES  Signed: Lyda Jester 11/14/2014, 9:18 AM  [name] MD 11/14/2014 8:09 PM

## 2014-11-14 NOTE — Plan of Care (Signed)
Problem: Phase II Progression Outcomes Goal: Vascular site scale level 0 - I Vascular Site Scale Level 0: No bruising/bleeding/hematoma Level I (Mild): Bruising/Ecchymosis, minimal bleeding/ooozing, palpable hematoma < 3 cm Level II (Moderate): Bleeding not affecting hemodynamic parameters, pseudoaneurysm, palpable hematoma > 3 cm Level III (Severe) Bleeding which affects hemodynamic parameters or retroperitoneal hemorrhage  Outcome: Adequate for Discharge Level (2)

## 2014-11-18 ENCOUNTER — Encounter: Payer: Self-pay | Admitting: Nurse Practitioner

## 2014-11-18 ENCOUNTER — Ambulatory Visit (INDEPENDENT_AMBULATORY_CARE_PROVIDER_SITE_OTHER): Payer: Medicare Other | Admitting: Nurse Practitioner

## 2014-11-18 VITALS — BP 120/62 | HR 74 | Ht 71.0 in | Wt 208.6 lb

## 2014-11-18 DIAGNOSIS — T888XXD Other specified complications of surgical and medical care, not elsewhere classified, subsequent encounter: Secondary | ICD-10-CM

## 2014-11-18 DIAGNOSIS — I4891 Unspecified atrial fibrillation: Secondary | ICD-10-CM | POA: Diagnosis not present

## 2014-11-18 DIAGNOSIS — Z9889 Other specified postprocedural states: Secondary | ICD-10-CM

## 2014-11-18 DIAGNOSIS — I480 Paroxysmal atrial fibrillation: Secondary | ICD-10-CM

## 2014-11-18 DIAGNOSIS — Z8679 Personal history of other diseases of the circulatory system: Secondary | ICD-10-CM

## 2014-11-18 NOTE — Patient Instructions (Signed)
Your physician recommends that you continue on your current medications as directed. Please refer to the Current Medication list given to you today.  Please keep your appointment with Dr. [name] on 1/15 at 2:00 pm

## 2014-11-18 NOTE — Progress Notes (Signed)
Primary Care Physician: [name], MD Primary Cardiologist:  Dr. Percival Spanish Primary Electrophysiologist:Dr. Allred Referring Physician:Dr. Shreyan Hinz is a 81 y.o. male with a h/o  paroxysmal/ atrial fibrillation who presents for f/u of PVI 11/12/14 for rt groin check for hematoma post procedure. He is still very tender and feels heaviness in his rt scrotum, but is seeing improvement each day. Bruising is resolving and no evidence of new bruising.  Is able to walk a little more each day. Last INR 2.16 with plans for next INR on Friday when he returns to see Dr. [name]. Has taken a [place] of his wife's a couple of times for sleep early on, but is sleeping better now. Has not noticed any irregularity in heart rhythm since procedure.    he has a BMI of Body mass index is 29.11 kg/(m^2).Marland Kitchen Filed Weights   11/18/14 0946  Weight: 208 lb 9.6 oz (94.62 kg)      Past Medical History  Diagnosis Date  . Allergic rhinitis   . Hyperlipidemia   . HTN (hypertension)   . Low back pain   . Paroxysmal atrial fibrillation     sees Dr. Percival Spanish   . GERD (gastroesophageal reflux disease)   . Glaucoma   . Hx of colonic polyps     tubular adenoma   . Nephrolithiasis   . Hemorrhoids     internal  . Pancolonic diverticulosis   . Cancer     FACIAL MELONOMA  . Cataract     REMOVED  . Kidney stone   . Coronary artery disease   . Myocardial infarction   . Dysrhythmia    Past Surgical History  Procedure Laterality Date  . Cholecystectomy  02/1990    Dr. [name]  . Vasectomy    . Right inguinal hernia repair  04/1990    Dr. [name]  . Refractive surgery    . Ruptured blood vessel in the bladder    . Removed 2 stones from left ureter    . Blood vessel repair in bladder    . Colonoscopy  06-29-12    per Dr. [name], adenomatous polyps, repeat in 3 yrs   . Tee without cardioversion N/A 11/11/2014    Procedure: TRANSESOPHAGEAL ECHOCARDIOGRAM (TEE);  Surgeon: [name],  MD;  Location: [facility] ENDOSCOPY;  Service: Cardiovascular;  Laterality: N/A;  needs INR before case  . Atrial fibrillation ablation N/A 11/12/2014    Procedure: ATRIAL FIBRILLATION ABLATION;  Surgeon: [name], MD;  Location: [facility] CATH LAB;  Service: Cardiovascular;  Laterality: N/A;    Current Outpatient Prescriptions  Medication Sig Dispense Refill  . acetaminophen (TYLENOL) 325 MG tablet Take 325 mg by mouth daily.     . cetirizine (ZYRTEC) 10 MG tablet Take 10 mg by mouth every morning.     . latanoprost (XALATAN) 0.005 % ophthalmic solution Place 1 drop into both eyes at bedtime.     Marland Kitchen lisinopril (PRINIVIL,ZESTRIL) 10 MG tablet Take 10 mg by mouth at bedtime.     Marland Kitchen loratadine (CLARITIN) 10 MG tablet Take 10 mg by mouth at bedtime.     . mometasone (NASONEX) 50 MCG/ACT nasal spray Place 2 sprays into both nostrils daily as needed (allergies).     . Multiple Vitamins-Minerals (CENTRUM SILVER PO) Take 1 tablet by mouth every morning.     Marland Kitchen omeprazole (PRILOSEC) 20 MG capsule Take 20 mg by mouth 2 (two) times daily.     . potassium  chloride (KLOR-CON) 10 MEQ CR tablet Take 10 mEq by mouth every morning.     . Probiotic Product (PROBIOTIC DAILY PO) Take 1 tablet by mouth daily.     . Psyllium (METAMUCIL PO) Take 30 mLs by mouth 2 (two) times daily.     . simvastatin (ZOCOR) 40 MG tablet Take 40 mg by mouth at bedtime.      . tamsulosin (FLOMAX) 0.4 MG CAPS Take 0.4 mg by mouth every evening.     . warfarin (COUMADIN) 5 MG tablet Take 1 tablet by mouth daily or as directed by coumadin clinic 30 tablet 5  . HYDROcodone-homatropine (HYCODAN) 5-1.5 MG/5ML syrup Take 5 mLs by mouth every 8 (eight) hours as needed for cough. (Patient not taking: Reported on 11/18/2014) 240 mL 0   No current facility-administered medications for this visit.    Allergies  Allergen Reactions  . Sulfa Antibiotics Other (See Comments)    Bad problem on groin area  . Sulfamethoxazole     REACTION: rash    History    Social History  . Marital Status: Married    Spouse Name: N/A    Number of Children: 2  . Years of Education: N/A   Occupational History  . Retired    Social History Main Topics  . Smoking status: Never Smoker   . Smokeless tobacco: Never Used  . Alcohol Use: No  . Drug Use: No  . Sexual Activity: Yes   Other Topics Concern  . Not on file   Social History Narrative    Family History  Problem Relation Age of Onset  . Heart attack Father 24  . Heart disease Father   . Colon cancer Neg Hx   . Ovarian cancer Mother      Physical Exam: Filed Vitals:   11/18/14 0946  BP: 120/62  Pulse: 74  Height: 5\' 11"  (1.803 m)  Weight: 208 lb 9.6 oz (94.62 kg)    GEN- The patient is well appearing, alert and oriented x 3 today.   Head- normocephalic, atraumatic Eyes-  Sclera clear, conjunctiva pink Ears- hearing intact Oropharynx- clear Neck- supple, no JVP Lymph- no cervical lymphadenopathy Lungs- Clear to ausculation bilaterally, normal work of breathing Heart- Regular rate and rhythm, no murmurs, rubs or gallops, PMI not laterally displaced GI- soft, NT, ND, + BS Extremities- yellow purplish resolving bruising involving the rt groin/scrotum. No bruit. Mild swelling of groin as well as rt scrotum. MS- no significant deformity or atrophy Skin- no rash or lesion Psych- euthymic mood, full affect Neuro- strength and sensation are intact   INR 2.16- 11/14/14       Epic records are reviewed at length today  Assessment and Plan:  1. Atrial fibrillation/s/p ablation 11/12/14 with rt groin hematoma Improving with resolving bruising and swelling.. Still use caution with ambulating. Tylenol as needed for discomfort. F/u with Dr. [name] on Friday as already scheduled. INR on Friday. Call AFib clinic phone  for any interim questions/ issues.     Co SignRoderic Palau, MD 11/18/2014 10:12 AM

## 2014-11-22 ENCOUNTER — Encounter: Payer: Self-pay | Admitting: Internal Medicine

## 2014-11-22 ENCOUNTER — Ambulatory Visit (INDEPENDENT_AMBULATORY_CARE_PROVIDER_SITE_OTHER): Payer: Medicare Other | Admitting: Internal Medicine

## 2014-11-22 ENCOUNTER — Ambulatory Visit (INDEPENDENT_AMBULATORY_CARE_PROVIDER_SITE_OTHER): Payer: Medicare Other | Admitting: *Deleted

## 2014-11-22 VITALS — BP 144/76 | HR 79 | Ht 71.0 in | Wt 205.0 lb

## 2014-11-22 DIAGNOSIS — I481 Persistent atrial fibrillation: Secondary | ICD-10-CM | POA: Diagnosis not present

## 2014-11-22 DIAGNOSIS — I4891 Unspecified atrial fibrillation: Secondary | ICD-10-CM

## 2014-11-22 DIAGNOSIS — Z7901 Long term (current) use of anticoagulants: Secondary | ICD-10-CM

## 2014-11-22 DIAGNOSIS — I4819 Other persistent atrial fibrillation: Secondary | ICD-10-CM

## 2014-11-22 DIAGNOSIS — Z5181 Encounter for therapeutic drug level monitoring: Secondary | ICD-10-CM

## 2014-11-22 DIAGNOSIS — I480 Paroxysmal atrial fibrillation: Secondary | ICD-10-CM

## 2014-11-22 LAB — POCT INR: INR: 1.8

## 2014-11-22 MED ORDER — WARFARIN SODIUM 5 MG PO TABS: ORAL_TABLET | ORAL | Status: DC

## 2014-11-22 NOTE — Progress Notes (Signed)
Primary Care Physician: [name], MD Primary Cardiologist:  [name] [name] Primary Electrophysiologist:Dr. Allred Referring Physician:Dr. Taksh Hjort is a 81 y.o. male with a h/o  paroxysmal/ atrial fibrillation who presents for f/u of PVI 11/12/14 for rt groin check for hematoma post procedure. He is still very tender and feels heaviness in his rt scrotum, but has had some improvement in the discomfort since Monday but not much. Still taking an occasional  Vicodin of his wife's for discomfort. Bruising is resolving and no evidence of new bruising, definite improvement seen at the site since Monday. Is able to walk a little more each day.Has not noticed any irregularity in heart rhythm since procedure. No issues with swallowing/eating.    he has a BMI of Body mass index is 28.6 kg/(m^2).Marland Kitchen Filed Weights   11/22/14 1352  Weight: 205 lb (92.987 kg)      Past Medical History  Diagnosis Date  . Allergic rhinitis   . Hyperlipidemia   . HTN (hypertension)   . Low back pain   . Paroxysmal atrial fibrillation     sees [name] [name]   . GERD (gastroesophageal reflux disease)   . Glaucoma   . Hx of colonic polyps     tubular adenoma   . Nephrolithiasis   . Hemorrhoids     internal  . Pancolonic diverticulosis   . Cancer     FACIAL MELONOMA  . Cataract     REMOVED  . Kidney stone   . Coronary artery disease   . Myocardial infarction   . Dysrhythmia    Past Surgical History  Procedure Laterality Date  . Cholecystectomy  02/1990    Dr. [name]  . Vasectomy    . Right inguinal hernia repair  04/1990    Dr. [name]  . Refractive surgery    . Ruptured blood vessel in the bladder    . Removed 2 stones from left ureter    . Blood vessel repair in bladder    . Colonoscopy  06-29-12    per Dr. [name], adenomatous polyps, repeat in 3 yrs   . Tee without cardioversion N/A 11/11/2014    Procedure: TRANSESOPHAGEAL ECHOCARDIOGRAM (TEE);  Surgeon: [name], MD;   Location: [facility] ENDOSCOPY;  Service: Cardiovascular;  Laterality: N/A;  needs INR before case  . Atrial fibrillation ablation N/A 11/12/2014    Procedure: ATRIAL FIBRILLATION ABLATION;  Surgeon: [name], MD;  Location: [facility] CATH LAB;  Service: Cardiovascular;  Laterality: N/A;    Current Outpatient Prescriptions  Medication Sig Dispense Refill  . acetaminophen (TYLENOL) 325 MG tablet Take 325 mg by mouth daily.     . cetirizine (ZYRTEC) 10 MG tablet Take 10 mg by mouth every morning.     Marland Kitchen HYDROcodone-homatropine (HYCODAN) 5-1.5 MG/5ML syrup Take 5 mLs by mouth every 8 (eight) hours as needed for cough. 240 mL 0  . latanoprost (XALATAN) 0.005 % ophthalmic solution Place 1 drop into both eyes at bedtime.     Marland Kitchen lisinopril (PRINIVIL,ZESTRIL) 10 MG tablet Take 10 mg by mouth at bedtime.     Marland Kitchen loratadine (CLARITIN) 10 MG tablet Take 10 mg by mouth at bedtime.     . mometasone (NASONEX) 50 MCG/ACT nasal spray Place 2 sprays into both nostrils daily as needed (allergies).     . Multiple Vitamins-Minerals (CENTRUM SILVER PO) Take 1 tablet by mouth every morning.     Marland Kitchen omeprazole (PRILOSEC) 20 MG capsule Take 20 mg  by mouth 2 (two) times daily.     . potassium chloride (KLOR-CON) 10 MEQ CR tablet Take 10 mEq by mouth every morning.     . Probiotic Product (PROBIOTIC DAILY PO) Take 1 tablet by mouth daily.     . Psyllium (METAMUCIL PO) Take 30 mLs by mouth 2 (two) times daily.     . simvastatin (ZOCOR) 40 MG tablet Take 40 mg by mouth at bedtime.      . tamsulosin (FLOMAX) 0.4 MG CAPS Take 0.4 mg by mouth every evening.     . warfarin (COUMADIN) 5 MG tablet Take as directed by coumadin clinic 30 tablet 3   No current facility-administered medications for this visit.    Allergies  Allergen Reactions  . Sulfa Antibiotics Other (See Comments)    Bad problem on groin area  . Sulfamethoxazole     REACTION: rash    History   Social History  . Marital Status: Married    Spouse Name: N/A     Number of Children: 2  . Years of Education: N/A   Occupational History  . Retired    Social History Main Topics  . Smoking status: Never Smoker   . Smokeless tobacco: Never Used  . Alcohol Use: No  . Drug Use: No  . Sexual Activity: Yes   Other Topics Concern  . Not on file   Social History Narrative    Family History  Problem Relation Age of Onset  . Heart attack Father 15  . Heart disease Father   . Colon cancer Neg Hx   . Ovarian cancer Mother      Physical Exam: Filed Vitals:   11/22/14 1352  BP: 144/76  Pulse: 79  Height: 5\' 11"  (1.803 m)  Weight: 205 lb (92.987 kg)    GEN- The patient is well appearing, alert and oriented x 3 today.   Head- normocephalic, atraumatic Eyes-  Sclera clear, conjunctiva pink Ears- hearing intact Oropharynx- clear Neck- supple, no JVP Lymph- no cervical lymphadenopathy Lungs- Clear to ausculation bilaterally, normal work of breathing Heart- Regular rate and rhythm, no murmurs, rubs or gallops, PMI not laterally displaced GI- soft, NT, ND, + BS Extremities-  Bruising resolving, hematoma still present over the rt scrotum area, but feels less tense since Monday. No bruit.  MS- no significant deformity or atrophy Skin- no rash or lesion Psych- euthymic mood, full affect Neuro- strength and sensation are intact   INR 1.8.         Assessment and Plan:  1. Atrial fibrillation/s/p ablation 11/12/14 with rt groin hematoma Improving with resolving bruising and swelling.  Dr. [name] examined and reassured pt that it may take weeks for hematoma/tendrness to resolve. Still use caution with ambulating. No driving for one more week. States tylenol not helping discomfort. RX for Vicodin, 12 tabs, one every 6 hours as needed. No refills F/u with Dr. [name] two weeks. INR  as per coumadin clinic.      Co SignRoderic Palau, MD 11/22/2014 3:00 PM

## 2014-11-22 NOTE — Patient Instructions (Signed)
Your physician recommends that you schedule a follow-up appointment in: 2 weeks on 12/02/14 with [name]

## 2014-12-02 ENCOUNTER — Ambulatory Visit: Payer: Medicare Other | Admitting: Nurse Practitioner

## 2014-12-04 ENCOUNTER — Encounter: Payer: Self-pay | Admitting: Nurse Practitioner

## 2014-12-04 ENCOUNTER — Ambulatory Visit (INDEPENDENT_AMBULATORY_CARE_PROVIDER_SITE_OTHER): Payer: Medicare Other | Admitting: Nurse Practitioner

## 2014-12-04 ENCOUNTER — Ambulatory Visit (INDEPENDENT_AMBULATORY_CARE_PROVIDER_SITE_OTHER): Payer: Medicare Other | Admitting: *Deleted

## 2014-12-04 VITALS — BP 152/73 | HR 74 | Ht 71.0 in | Wt 205.0 lb

## 2014-12-04 DIAGNOSIS — Z7901 Long term (current) use of anticoagulants: Secondary | ICD-10-CM | POA: Diagnosis not present

## 2014-12-04 DIAGNOSIS — I4891 Unspecified atrial fibrillation: Secondary | ICD-10-CM

## 2014-12-04 DIAGNOSIS — Z5181 Encounter for therapeutic drug level monitoring: Secondary | ICD-10-CM

## 2014-12-04 DIAGNOSIS — I480 Paroxysmal atrial fibrillation: Secondary | ICD-10-CM

## 2014-12-04 DIAGNOSIS — T888XXD Other specified complications of surgical and medical care, not elsewhere classified, subsequent encounter: Secondary | ICD-10-CM

## 2014-12-04 LAB — POCT INR: INR: 1.9

## 2014-12-04 NOTE — Progress Notes (Addendum)
Primary Care Physician: [name], MD Primary Cardiologist:  Dr. Percival Spanish Primary Electrophysiologist:Dr. Allred Referring Physician:Dr. Quanell Loughney is a 81 y.o. male with a h/o  paroxysmal/ atrial fibrillation who presents for f/u of PVI 11/12/14 for rt groin check for hematoma post procedure. He has shown great improvement in swelling and pain over the last week and did not require a wheelchair for today's appointment. Has returned to sleeping in his bed on the second story and climbing stairs without difficulty. Has not noticed any irregularity in heart rhythm since procedure. No issues with swallowing/eating.    he has a BMI of Body mass index is 28.6 kg/(m^2).Marland Kitchen Filed Weights   12/04/14 1556  Weight: 205 lb (92.987 kg)      Past Medical History  Diagnosis Date  . Allergic rhinitis   . Hyperlipidemia   . HTN (hypertension)   . Low back pain   . Paroxysmal atrial fibrillation     sees Dr. Percival Spanish   . GERD (gastroesophageal reflux disease)   . Glaucoma   . Hx of colonic polyps     tubular adenoma   . Nephrolithiasis   . Hemorrhoids     internal  . Pancolonic diverticulosis   . Cancer     FACIAL MELONOMA  . Cataract     REMOVED  . Kidney stone   . Coronary artery disease   . Myocardial infarction   . Dysrhythmia    Past Surgical History  Procedure Laterality Date  . Cholecystectomy  02/1990    Dr. [name]  . Vasectomy    . Right inguinal hernia repair  04/1990    Dr. [name]  . Refractive surgery    . Ruptured blood vessel in the bladder    . Removed 2 stones from left ureter    . Blood vessel repair in bladder    . Colonoscopy  06-29-12    per Dr. [name], adenomatous polyps, repeat in 3 yrs   . Tee without cardioversion N/A 11/11/2014    Procedure: TRANSESOPHAGEAL ECHOCARDIOGRAM (TEE);  Surgeon: [name], MD;  Location: [facility] ENDOSCOPY;  Service: Cardiovascular;  Laterality: N/A;  needs INR before case  . Atrial fibrillation  ablation N/A 11/12/2014    Procedure: ATRIAL FIBRILLATION ABLATION;  Surgeon: [name], MD;  Location: [facility] CATH LAB;  Service: Cardiovascular;  Laterality: N/A;    Current Outpatient Prescriptions  Medication Sig Dispense Refill  . acetaminophen (TYLENOL) 325 MG tablet Take 325 mg by mouth daily.     . cetirizine (ZYRTEC) 10 MG tablet Take 10 mg by mouth every morning.     Marland Kitchen HYDROcodone-homatropine (HYCODAN) 5-1.5 MG/5ML syrup Take 5 mLs by mouth every 8 (eight) hours as needed for cough. 240 mL 0  . latanoprost (XALATAN) 0.005 % ophthalmic solution Place 1 drop into both eyes at bedtime.     Marland Kitchen lisinopril (PRINIVIL,ZESTRIL) 10 MG tablet Take 10 mg by mouth at bedtime.     Marland Kitchen loratadine (CLARITIN) 10 MG tablet Take 10 mg by mouth at bedtime.     . mometasone (NASONEX) 50 MCG/ACT nasal spray Place 2 sprays into both nostrils daily as needed (allergies).     . Multiple Vitamins-Minerals (CENTRUM SILVER PO) Take 1 tablet by mouth every morning.     Marland Kitchen omeprazole (PRILOSEC) 20 MG capsule Take 20 mg by mouth 2 (two) times daily.     . potassium chloride (KLOR-CON) 10 MEQ CR tablet Take 10 mEq by mouth  every morning.     . Probiotic Product (PROBIOTIC DAILY PO) Take 1 tablet by mouth daily.     . Psyllium (METAMUCIL PO) Take 30 mLs by mouth 2 (two) times daily.     . simvastatin (ZOCOR) 40 MG tablet Take 40 mg by mouth at bedtime.      . tamsulosin (FLOMAX) 0.4 MG CAPS Take 0.4 mg by mouth every evening.     . warfarin (COUMADIN) 5 MG tablet Take as directed by coumadin clinic 30 tablet 3   No current facility-administered medications for this visit.    Allergies  Allergen Reactions  . Sulfa Antibiotics Other (See Comments)    Bad problem on groin area  . Sulfamethoxazole     REACTION: rash    History   Social History  . Marital Status: Married    Spouse Name: N/A    Number of Children: 2  . Years of Education: N/A   Occupational History  . Retired    Social History Main Topics    . Smoking status: Never Smoker   . Smokeless tobacco: Never Used  . Alcohol Use: No  . Drug Use: No  . Sexual Activity: Yes   Other Topics Concern  . Not on file   Social History Narrative    Family History  Problem Relation Age of Onset  . Heart attack Father 18  . Heart disease Father   . Colon cancer Neg Hx   . Ovarian cancer Mother      Physical Exam:   GEN- The patient is well appearing, alert and oriented x 3 today.   Head- normocephalic, atraumatic Eyes-  Sclera clear, conjunctiva pink Ears- hearing intact Oropharynx- clear Neck- supple, no JVP Lymph- no cervical lymphadenopathy Lungs- Clear to ausculation bilaterally, normal work of breathing Heart- Regular rate and rhythm, no murmurs, rubs or gallops, PMI not laterally displaced GI- soft, NT, ND, + BS Extremities-  Bruising resolved.  MS- no significant deformity or atrophy Skin- no rash or lesion Psych- euthymic mood, full affect Neuro- strength and sensation are intact   INR 1.8.  Ekg shows NSR, normal EKG.       Assessment and Plan:  1. Atrial fibrillation/s/p ablation 11/12/14 with rt groin hematoma Resolved. No activity restrictions. No evidence of afib since ablation. F/u with Dr. [name] in 2 months.      Co SignRoderic Palau, MD 12/04/2014 4:27 PM

## 2014-12-04 NOTE — Patient Instructions (Signed)
Your physician recommends that you schedule a follow-up appointment in: 2 [place].  Your physician recommends that you continue on your current medications as directed. Please refer to the Current Medication list given to you today.

## 2014-12-16 ENCOUNTER — Ambulatory Visit (INDEPENDENT_AMBULATORY_CARE_PROVIDER_SITE_OTHER): Payer: Medicare Other | Admitting: Pharmacist

## 2014-12-16 DIAGNOSIS — Z7901 Long term (current) use of anticoagulants: Secondary | ICD-10-CM | POA: Diagnosis not present

## 2014-12-16 DIAGNOSIS — Z5181 Encounter for therapeutic drug level monitoring: Secondary | ICD-10-CM | POA: Diagnosis not present

## 2014-12-16 DIAGNOSIS — I4891 Unspecified atrial fibrillation: Secondary | ICD-10-CM | POA: Diagnosis not present

## 2014-12-16 LAB — POCT INR: INR: 2.4

## 2015-01-01 DIAGNOSIS — L814 Other melanin hyperpigmentation: Secondary | ICD-10-CM | POA: Diagnosis not present

## 2015-01-01 DIAGNOSIS — Z85820 Personal history of malignant melanoma of skin: Secondary | ICD-10-CM | POA: Diagnosis not present

## 2015-01-01 DIAGNOSIS — D1801 Hemangioma of skin and subcutaneous tissue: Secondary | ICD-10-CM | POA: Diagnosis not present

## 2015-01-01 DIAGNOSIS — Z08 Encounter for follow-up examination after completed treatment for malignant neoplasm: Secondary | ICD-10-CM | POA: Diagnosis not present

## 2015-01-01 DIAGNOSIS — L570 Actinic keratosis: Secondary | ICD-10-CM | POA: Diagnosis not present

## 2015-01-06 DIAGNOSIS — H4011X2 Primary open-angle glaucoma, moderate stage: Secondary | ICD-10-CM | POA: Diagnosis not present

## 2015-01-13 ENCOUNTER — Ambulatory Visit (INDEPENDENT_AMBULATORY_CARE_PROVIDER_SITE_OTHER): Payer: Medicare Other

## 2015-01-13 DIAGNOSIS — Z7901 Long term (current) use of anticoagulants: Secondary | ICD-10-CM

## 2015-01-13 DIAGNOSIS — Z5181 Encounter for therapeutic drug level monitoring: Secondary | ICD-10-CM

## 2015-01-13 DIAGNOSIS — I4891 Unspecified atrial fibrillation: Secondary | ICD-10-CM | POA: Diagnosis not present

## 2015-01-13 LAB — POCT INR: INR: 1.9

## 2015-02-04 ENCOUNTER — Encounter: Payer: Self-pay | Admitting: *Deleted

## 2015-02-04 ENCOUNTER — Ambulatory Visit (INDEPENDENT_AMBULATORY_CARE_PROVIDER_SITE_OTHER): Payer: Medicare Other | Admitting: Nurse Practitioner

## 2015-02-04 ENCOUNTER — Encounter: Payer: Self-pay | Admitting: Nurse Practitioner

## 2015-02-04 ENCOUNTER — Ambulatory Visit (INDEPENDENT_AMBULATORY_CARE_PROVIDER_SITE_OTHER): Payer: Medicare Other

## 2015-02-04 VITALS — BP 92/60 | HR 102 | Wt 215.0 lb

## 2015-02-04 DIAGNOSIS — I480 Paroxysmal atrial fibrillation: Secondary | ICD-10-CM | POA: Diagnosis not present

## 2015-02-04 DIAGNOSIS — I4891 Unspecified atrial fibrillation: Secondary | ICD-10-CM

## 2015-02-04 DIAGNOSIS — Z7901 Long term (current) use of anticoagulants: Secondary | ICD-10-CM | POA: Diagnosis not present

## 2015-02-04 DIAGNOSIS — Z5181 Encounter for therapeutic drug level monitoring: Secondary | ICD-10-CM

## 2015-02-04 LAB — BASIC METABOLIC PANEL
BUN: 22 mg/dL (ref 6–23)
CHLORIDE: 101 meq/L (ref 96–112)
CO2: 33 meq/L — AB (ref 19–32)
Calcium: 10 mg/dL (ref 8.4–10.5)
Creatinine, Ser: 1.23 mg/dL (ref 0.40–1.50)
GFR: 59.64 mL/min — ABNORMAL LOW (ref >60.00)
GLUCOSE: 154 mg/dL — AB (ref 70–99)
Potassium: 4.3 mEq/L (ref 3.5–5.1)
Sodium: 139 mEq/L (ref 135–145)

## 2015-02-04 LAB — CBC WITH DIFFERENTIAL/PLATELET
BASOS PCT: 0.7 % (ref 0.0–3.0)
Basophils Absolute: 0.1 10*3/uL (ref 0.0–0.1)
EOS ABS: 0.2 10*3/uL (ref 0.0–0.7)
Eosinophils Relative: 1.4 % (ref 0.0–5.0)
HCT: 44.8 % (ref 39.0–52.0)
Hemoglobin: 15.1 g/dL (ref 13.0–17.0)
LYMPHS ABS: 2.7 10*3/uL (ref 0.7–4.0)
Lymphocytes Relative: 23.5 % (ref 12.0–46.0)
MCHC: 33.8 g/dL (ref 30.0–36.0)
MCV: 95.5 fl (ref 78.0–100.0)
Monocytes Absolute: 0.8 10*3/uL (ref 0.1–1.0)
Monocytes Relative: 7.3 % (ref 3.0–12.0)
NEUTROS ABS: 7.7 10*3/uL (ref 1.4–7.7)
Neutrophils Relative %: 67.1 % (ref 43.0–77.0)
Platelets: 230 10*3/uL (ref 150.0–400.0)
RBC: 4.69 Mil/uL (ref 4.22–5.81)
RDW: 14 % (ref 11.5–15.5)
WBC: 11.4 10*3/uL — ABNORMAL HIGH (ref 4.0–10.5)

## 2015-02-04 LAB — POCT INR: INR: 2.2

## 2015-02-04 MED ORDER — METOPROLOL TARTRATE 50 MG PO TABS: 25.0000 mg | ORAL_TABLET | Freq: Two times a day (BID) | ORAL | Status: DC

## 2015-02-04 NOTE — Progress Notes (Signed)
Primary Care Physician: [name], MD Primary Cardiologist:  Dr. Percival Spanish Primary Electrophysiologist:Dr. Allred Referring Physician:Dr. Augusten Lipkin is a 81 y.o. male with a h/o persistent atrial fibrillation, PVI 11/12/14, here today for evaluation of afib that started yesterday.  He had been maintaining SR since ablation. He reported that he and his wife had been to the beach and he was carrying heavy suitcases  up the steps when it started.  He takes metoprolol 50 mg 1/2 tab bid but took an extra dose of his wife's atenolol 100 mg yesterday am and took a full tablet of metoprolol this am.  HR has been in the 100-110 range. Feels weak in afib. BP running lower at 92/60.  INR today is 2.2 but on 3/7 it was 1.9 but no missed doses, however this will probably necessitate a TTE prior to DCCV.  Currently, no chest pain, presyncope/syncopy, PND/orthopnea.  he has a BMI of Body mass index is 30 kg/(m^2).Marland Kitchen Filed Weights   02/04/15 1317  Weight: 215 lb (97.523 kg)      Past Medical History  Diagnosis Date  . Allergic rhinitis   . Hyperlipidemia   . HTN (hypertension)   . Low back pain   . Paroxysmal atrial fibrillation     sees Dr. Percival Spanish   . GERD (gastroesophageal reflux disease)   . Glaucoma   . Hx of colonic polyps     tubular adenoma   . Nephrolithiasis   . Hemorrhoids     internal  . Pancolonic diverticulosis   . Cancer     FACIAL MELONOMA  . Cataract     REMOVED  . Kidney stone   . Coronary artery disease   . Myocardial infarction   . Dysrhythmia    Past Surgical History  Procedure Laterality Date  . Cholecystectomy  02/1990    Dr. [name]  . Vasectomy    . Right inguinal hernia repair  04/1990    Dr. [name]  . Refractive surgery    . Ruptured blood vessel in the bladder    . Removed 2 stones from left ureter    . Blood vessel repair in bladder    . Colonoscopy  06-29-12    per Dr. [name], adenomatous polyps, repeat in 3 yrs   . Tee  without cardioversion N/A 11/11/2014    Procedure: TRANSESOPHAGEAL ECHOCARDIOGRAM (TEE);  Surgeon: [name], MD;  Location: [facility] ENDOSCOPY;  Service: Cardiovascular;  Laterality: N/A;  needs INR before case  . Atrial fibrillation ablation N/A 11/12/2014    Procedure: ATRIAL FIBRILLATION ABLATION;  Surgeon: [name], MD;  Location: [facility] CATH LAB;  Service: Cardiovascular;  Laterality: N/A;    Current Outpatient Prescriptions  Medication Sig Dispense Refill  . acetaminophen (TYLENOL) 325 MG tablet Take 325 mg by mouth daily.     . cetirizine (ZYRTEC) 10 MG tablet Take 10 mg by mouth every morning.     Marland Kitchen HYDROcodone-homatropine (HYCODAN) 5-1.5 MG/5ML syrup Take 5 mLs by mouth every 8 (eight) hours as needed for cough. 240 mL 0  . latanoprost (XALATAN) 0.005 % ophthalmic solution Place 1 drop into both eyes at bedtime.     Marland Kitchen lisinopril (PRINIVIL,ZESTRIL) 10 MG tablet Take 10 mg by mouth at bedtime.     Marland Kitchen loratadine (CLARITIN) 10 MG tablet Take 10 mg by mouth at bedtime.     . mometasone (NASONEX) 50 MCG/ACT nasal spray Place 2 sprays into both nostrils daily as needed (  allergies).     . Multiple Vitamins-Minerals (CENTRUM SILVER PO) Take 1 tablet by mouth every morning.     Marland Kitchen omeprazole (PRILOSEC) 20 MG capsule Take 20 mg by mouth 2 (two) times daily.     . potassium chloride (KLOR-CON) 10 MEQ CR tablet Take 10 mEq by mouth every morning.     . Probiotic Product (PROBIOTIC DAILY PO) Take 1 tablet by mouth daily.     . Psyllium (METAMUCIL PO) Take 30 mLs by mouth 2 (two) times daily.     . simvastatin (ZOCOR) 40 MG tablet Take 40 mg by mouth at bedtime.      . tamsulosin (FLOMAX) 0.4 MG CAPS Take 0.4 mg by mouth every evening.     . warfarin (COUMADIN) 5 MG tablet Take as directed by coumadin clinic 30 tablet 3   No current facility-administered medications for this visit.    Allergies  Allergen Reactions  . Sulfa Antibiotics Other (See Comments)    Bad problem on groin area  .  Sulfamethoxazole     REACTION: rash    History   Social History  . Marital Status: Married    Spouse Name: N/A  . Number of Children: 2  . Years of Education: N/A   Occupational History  . Retired    Social History Main Topics  . Smoking status: Never Smoker   . Smokeless tobacco: Never Used  . Alcohol Use: No  . Drug Use: No  . Sexual Activity: Yes   Other Topics Concern  . Not on file   Social History Narrative    Family History  Problem Relation Age of Onset  . Heart attack Father 48  . Heart disease Father   . Colon cancer Neg Hx   . Ovarian cancer Mother      Physical Exam: well appearing male in no acute distress.    GEN- The patient is well appearing, alert and oriented x 3 today.   Head- normocephalic, atraumatic Eyes-  Sclera clear, conjunctiva pink Ears- hearing intact Oropharynx- clear Neck- supple, no JVP Lymph- no cervical lymphadenopathy Lungs- Clear to ausculation bilaterally, normal work of breathing Heart- Irregular rate and rhythm, no murmurs, rubs or gallops, PMI not laterally displaced GI- soft, NT, ND, + BS Extremities-  Bruising resolved.  MS- no significant deformity or atrophy Skin- no rash or lesion Psych- euthymic mood, full affect Neuro- strength and sensation are intact   INR- 2.4- 2/8, 1.9 -3/7, today 2.2  Ekg shows  afib at 102 bpm.    Assessment and Plan:  1.Symtomatic persistent Atrial fibrillation s/p ablation 11/12/14  If BP above 859 systolic tonight and remains in afib, can take 50 mg bid of metoprolol Schedule for DCCV with TEE  on Thursday due to INR 1.9( 01/13/15)  2. F/u with Dr. [name] on Tuesday as scheduled      Co Sign: Roderic Palau, MD 02/04/2015 1:57 PM

## 2015-02-04 NOTE — Patient Instructions (Signed)
Your physician has recommended that you have a Cardioversion (DCCV). Electrical Cardioversion uses a jolt of electricity to your heart either through paddles or wired patches attached to your chest. This is a controlled, usually prescheduled, procedure. Defibrillation is done under light anesthesia in the hospital, and you usually go home the day of the procedure. This is done to get your heart back into a normal rhythm. You are not awake for the procedure. Please see the instruction sheet given to you today.  See instruction sheet for procedure  Electrical Cardioversion Electrical cardioversion is the delivery of a jolt of electricity to change the rhythm of the heart. Sticky patches or metal paddles are placed on the chest to deliver the electricity from a device. This is done to restore a normal rhythm. A rhythm that is too fast or not regular keeps the heart from pumping well. Electrical cardioversion is done in an emergency if:   There is low or no blood pressure as a result of the heart rhythm.   Normal rhythm must be restored as fast as possible to protect the brain and heart from further damage.   It may save a life. Cardioversion may be done for heart rhythms that are not immediately life threatening, such as atrial fibrillation or flutter, in which:   The heart is beating too fast or is not regular.   Medicine to change the rhythm has not worked.   It is safe to wait in order to allow time for preparation.  Symptoms of the abnormal rhythm are bothersome.  The risk of stroke and other serious problems can be reduced. LET [facility] CARE PROVIDER KNOW ABOUT:   Any allergies you have.  All medicines you are taking, including vitamins, herbs, eye drops, creams, and over-the-counter medicines.  Previous problems you or members of your family have had with the use of anesthetics.   Any blood disorders you have.   Previous surgeries you have had.   Medical conditions  you have. RISKS AND COMPLICATIONS  Generally, this is a safe procedure. However, problems can occur and include:   Breathing problems related to the anesthetic used.  A blood clot that breaks free and travels to other parts of your body. This could cause a stroke or other problems. The risk of this is lowered by use of blood-thinning medicine (anticoagulant) prior to the procedure.  Cardiac arrest (rare). BEFORE THE PROCEDURE   You may have tests to detect blood clots in your heart and to evaluate heart function.  You may start taking anticoagulants so your blood does not clot as easily.   Medicines may be given to help stabilize your heart rate and rhythm. PROCEDURE  You will be given medicine through an IV tube to reduce discomfort and make you sleepy (sedative).   An electrical shock will be delivered. AFTER THE PROCEDURE Your heart rhythm will be watched to make sure it does not change.  Document Released: 10/15/2002 Document Revised: 03/11/2014 Document Reviewed: 05/09/2013 [facility] Patient Information 2015 [place], [place]. This information is not intended to replace advice given to you by your health care provider. Make sure you discuss any questions you have with your health care provider.

## 2015-02-06 ENCOUNTER — Ambulatory Visit (HOSPITAL_COMMUNITY)
Admission: RE | Admit: 2015-02-06 | Discharge: 2015-02-06 | Disposition: A | Discharge status: Home Health | Payer: Medicare Other | Source: Ambulatory Visit | Attending: Cardiology | Admitting: Cardiology

## 2015-02-06 ENCOUNTER — Ambulatory Visit (HOSPITAL_COMMUNITY): Payer: Medicare Other | Admitting: Certified Registered"

## 2015-02-06 ENCOUNTER — Encounter (HOSPITAL_COMMUNITY)
Admission: RE | Disposition: A | Discharge status: Home Health | Payer: Self-pay | Source: Ambulatory Visit | Attending: Cardiology

## 2015-02-06 ENCOUNTER — Encounter (HOSPITAL_COMMUNITY): Payer: Self-pay | Admitting: *Deleted

## 2015-02-06 DIAGNOSIS — Z87442 Personal history of urinary calculi: Secondary | ICD-10-CM | POA: Insufficient documentation

## 2015-02-06 DIAGNOSIS — N138 Other obstructive and reflux uropathy: Secondary | ICD-10-CM | POA: Insufficient documentation

## 2015-02-06 DIAGNOSIS — I10 Essential (primary) hypertension: Secondary | ICD-10-CM | POA: Diagnosis not present

## 2015-02-06 DIAGNOSIS — I4891 Unspecified atrial fibrillation: Secondary | ICD-10-CM | POA: Diagnosis not present

## 2015-02-06 DIAGNOSIS — K219 Gastro-esophageal reflux disease without esophagitis: Secondary | ICD-10-CM | POA: Diagnosis not present

## 2015-02-06 DIAGNOSIS — E785 Hyperlipidemia, unspecified: Secondary | ICD-10-CM | POA: Insufficient documentation

## 2015-02-06 DIAGNOSIS — Z538 Procedure and treatment not carried out for other reasons: Secondary | ICD-10-CM | POA: Insufficient documentation

## 2015-02-06 DIAGNOSIS — I252 Old myocardial infarction: Secondary | ICD-10-CM | POA: Diagnosis not present

## 2015-02-06 DIAGNOSIS — Z8701 Personal history of pneumonia (recurrent): Secondary | ICD-10-CM | POA: Diagnosis not present

## 2015-02-06 DIAGNOSIS — N401 Enlarged prostate with lower urinary tract symptoms: Secondary | ICD-10-CM | POA: Diagnosis not present

## 2015-02-06 DIAGNOSIS — Z9049 Acquired absence of other specified parts of digestive tract: Secondary | ICD-10-CM | POA: Insufficient documentation

## 2015-02-06 DIAGNOSIS — I2510 Atherosclerotic heart disease of native coronary artery without angina pectoris: Secondary | ICD-10-CM | POA: Insufficient documentation

## 2015-02-06 SURGERY — CANCELLED PROCEDURE
Anesthesia: Monitor Anesthesia Care

## 2015-02-06 MED ORDER — LACTATED RINGERS IV SOLN
INTRAVENOUS | Status: DC
  Administered 2015-02-06: 1000 mL via INTRAVENOUS

## 2015-02-06 MED ORDER — FENTANYL CITRATE 0.05 MG/ML IJ SOLN
INTRAMUSCULAR | Status: AC
  Filled 2015-02-06: qty 5

## 2015-02-06 MED ORDER — LIDOCAINE HCL (CARDIAC) 20 MG/ML IV SOLN
INTRAVENOUS | Status: DC | PRN
  Administered 2015-02-06: 50 mg via INTRAVENOUS

## 2015-02-06 MED ORDER — SODIUM CHLORIDE 0.9 % IV SOLN
INTRAVENOUS | Status: DC | PRN
  Administered 2015-02-06: 10:00:00 via INTRAVENOUS

## 2015-02-06 NOTE — Anesthesia Postprocedure Evaluation (Signed)
Anesthesia Post Note  Patient: [name] [name]  Procedure(s) Performed: Procedure(s): CANCELLED PROCEDURE  Anesthesia type: No sedation given  Patient location: PACU  Post pain: Pain level controlled  Post assessment: Post-op Vital signs reviewed  Last Vitals: BP 127/64 mmHg  Pulse 146  Temp(Src) 36.8 C (Oral)  Resp 13  SpO2 97%  Post vital signs: Reviewed  Level of consciousness: sedated  Complications: No apparent anesthesia complications

## 2015-02-06 NOTE — Anesthesia Preprocedure Evaluation (Signed)
Anesthesia Evaluation  Patient identified by MRN, date of birth, ID band Patient awake    Reviewed: Allergy & Precautions, NPO status , Patient's Chart, lab work & pertinent test results, reviewed documented beta blocker date and time   Airway Mallampati: II   Neck ROM: Full    Dental  (+) Teeth Intact   Pulmonary pneumonia -,  breath sounds clear to auscultation        Cardiovascular hypertension, Pt. on medications + CAD and + Past MI + dysrhythmias Atrial Fibrillation Rhythm:Irregular  HX of sporadic AF, ECHO 11/11/2014 EF 65%, no thrombus   Neuro/Psych    GI/Hepatic GERD-  Medicated,  Endo/Other    Renal/GU Renal InsufficiencyRenal diseaseGFR 72     Musculoskeletal   Abdominal (+)  Abdomen: soft.    Peds  Hematology   Anesthesia Other Findings   Reproductive/Obstetrics                             Anesthesia Physical  Anesthesia Plan  ASA: III  Anesthesia Plan: MAC   Post-op Pain Management:    Induction: Intravenous  Airway Management Planned: Nasal Cannula  Additional Equipment:   Intra-op Plan:   Post-operative Plan:   Informed Consent: I have reviewed the patients History and Physical, chart, labs and discussed the procedure including the risks, benefits and alternatives for the proposed anesthesia with the patient or authorized representative who has indicated his/her understanding and acceptance.   Dental advisory given  Plan Discussed with: CRNA  Anesthesia Plan Comments:         Anesthesia Quick Evaluation

## 2015-02-06 NOTE — Transfer of Care (Signed)
Immediate Anesthesia Transfer of Care Note  Patient: [name] [name]  Procedure(s) Performed: * No procedures listed *  Patient Location: PACU and Endoscopy Unit  Anesthesia Type:procedure canceled  Level of Consciousness: awake, alert  and oriented  Airway & Oxygen Therapy: Patient Spontanous Breathing  Post-op Assessment: Post -op Vital signs reviewed and stable  Post vital signs: stable  Last Vitals:  Filed Vitals:   02/06/15 0850  BP: 127/64  Pulse: 146  Temp: 36.8 C  Resp: 13    Complications: procedure canceled

## 2015-02-06 NOTE — OR Nursing (Signed)
Self converted to NSR, verified by EKG.  Dr. Marlou Porch aware.  Cancelled TEE cardioversion and sent home

## 2015-02-10 ENCOUNTER — Encounter: Payer: Self-pay | Admitting: Internal Medicine

## 2015-02-10 ENCOUNTER — Ambulatory Visit (INDEPENDENT_AMBULATORY_CARE_PROVIDER_SITE_OTHER): Payer: Medicare Other | Admitting: *Deleted

## 2015-02-10 ENCOUNTER — Ambulatory Visit (INDEPENDENT_AMBULATORY_CARE_PROVIDER_SITE_OTHER): Payer: Medicare Other | Admitting: Internal Medicine

## 2015-02-10 VITALS — BP 138/72 | HR 72 | Ht 71.0 in | Wt 216.0 lb

## 2015-02-10 DIAGNOSIS — Z7901 Long term (current) use of anticoagulants: Secondary | ICD-10-CM

## 2015-02-10 DIAGNOSIS — I480 Paroxysmal atrial fibrillation: Secondary | ICD-10-CM

## 2015-02-10 DIAGNOSIS — Z5181 Encounter for therapeutic drug level monitoring: Secondary | ICD-10-CM

## 2015-02-10 DIAGNOSIS — I10 Essential (primary) hypertension: Secondary | ICD-10-CM

## 2015-02-10 DIAGNOSIS — I4891 Unspecified atrial fibrillation: Secondary | ICD-10-CM | POA: Diagnosis not present

## 2015-02-10 LAB — POCT INR: INR: 2.3

## 2015-02-10 NOTE — Patient Instructions (Signed)
Your physician recommends that you schedule a follow-up appointment in 3 months with Dr Allred    

## 2015-02-10 NOTE — Progress Notes (Signed)
Electrophysiology Office Note   Date:  02/10/2015   ID:  [name] [name], DOB 10-07-1929, MRN [id number]  PCP:  [name], MD  Cardiologist:  Dr Percival Spanish Primary Electrophysiologist: [name], MD    Chief Complaint  Patient presents with  . Atrial Fibrillation    PAF     History of Present Illness: [name] [name] is a 81 y.o. male who presents today for electrophysiology evaluation.   Doing well s/p ablation.  Denies procedure related complications.  He did have ERAF last week but has otherwise been without afib.  He is pleased with his current state.   He has been reluctant to resume normal activities post ablation.  I have encouraged normal activity. Today, he denies symptoms of palpitations, chest pain, orthopnea, PND, lower extremity edema, claudication, dizziness, presyncope, syncope, bleeding, or neurologic sequela. The patient is tolerating medications without difficulties and is otherwise without complaint today.    Past Medical History  Diagnosis Date  . Allergic rhinitis   . Hyperlipidemia   . HTN (hypertension)   . Low back pain   . Paroxysmal atrial fibrillation     chads2vasc score is at least 4  . GERD (gastroesophageal reflux disease)   . Glaucoma   . Hx of colonic polyps     tubular adenoma   . Nephrolithiasis   . Hemorrhoids     internal  . Pancolonic diverticulosis   . Cancer     FACIAL MELONOMA  . Cataract     REMOVED  . Kidney stone   . Coronary artery disease   . Myocardial infarction    Past Surgical History  Procedure Laterality Date  . Cholecystectomy  02/1990    Dr. [name]  . Vasectomy    . Right inguinal hernia repair  04/1990    Dr. [name]  . Refractive surgery    . Ruptured blood vessel in the bladder    . Removed 2 stones from left ureter    . Blood vessel repair in bladder    . Colonoscopy  06-29-12    per Dr. [name], adenomatous polyps, repeat in 3 yrs   . Tee without cardioversion N/A 11/11/2014    Procedure:  TRANSESOPHAGEAL ECHOCARDIOGRAM (TEE);  Surgeon: [name], MD;  Location: [facility] ENDOSCOPY;  Service: Cardiovascular;  Laterality: N/A;  needs INR before case  . Atrial fibrillation ablation N/A 11/12/2014    Procedure: ATRIAL FIBRILLATION ABLATION;  Surgeon: [name], MD;  Location: [facility] CATH LAB;  Service: Cardiovascular;  Laterality: N/A;     Current Outpatient Prescriptions  Medication Sig Dispense Refill  . acetaminophen (TYLENOL) 325 MG tablet Take 325 mg by mouth daily.     . cetirizine (ZYRTEC) 10 MG tablet Take 10 mg by mouth every morning.     . latanoprost (XALATAN) 0.005 % ophthalmic solution Place 1 drop into both eyes at bedtime.     Marland Kitchen lisinopril (PRINIVIL,ZESTRIL) 10 MG tablet Take 10 mg by mouth at bedtime.     Marland Kitchen loratadine (CLARITIN) 10 MG tablet Take 10 mg by mouth at bedtime.     . metoprolol (LOPRESSOR) 50 MG tablet Take 0.5 tablets (25 mg total) by mouth 2 (two) times daily. 90 tablet 3  . mometasone (NASONEX) 50 MCG/ACT nasal spray Place 2 sprays into both nostrils daily as needed (allergies).     . Multiple Vitamins-Minerals (CENTRUM SILVER PO) Take 1 tablet by mouth every morning.     Marland Kitchen omeprazole (PRILOSEC) 20 MG capsule  Take 20 mg by mouth 2 (two) times daily.     . potassium chloride (KLOR-CON) 10 MEQ CR tablet Take 10 mEq by mouth every morning.     . Probiotic Product (PROBIOTIC DAILY PO) Take 1 tablet by mouth daily.     . Psyllium (METAMUCIL PO) Take 30 mLs by mouth 2 (two) times daily.     . simvastatin (ZOCOR) 40 MG tablet Take 40 mg by mouth at bedtime.      . tamsulosin (FLOMAX) 0.4 MG CAPS Take 0.4 mg by mouth every evening.     . warfarin (COUMADIN) 5 MG tablet Take as directed by coumadin clinic (Patient taking differently: Take 5 mg by mouth daily at 6 PM. ) 30 tablet 3   No current facility-administered medications for this visit.    Allergies:   Sulfa antibiotics and Sulfamethoxazole   Social History:  The patient  reports that he has never  smoked. He has never used smokeless tobacco. He reports that he does not drink alcohol or use illicit drugs.   Family History:  The patient's  family history includes Heart attack (age of onset: 13) in his father; Heart disease in his father; Ovarian cancer in his mother. There is no history of Colon cancer.    ROS:  Please see the history of present illness.   All other systems are reviewed and negative.    PHYSICAL EXAM: VS:  BP 138/72 mmHg  Pulse 72  Ht 5\' 11"  (1.803 m)  Wt 216 lb (97.977 kg)  BMI 30.14 kg/m2 , BMI Body mass index is 30.14 kg/(m^2). GEN: Well nourished, well developed, in no acute distress HEENT: normal Neck: no JVD, carotid bruits, or masses Cardiac: RRR; no murmurs, rubs, or gallops,no edema  Respiratory:  clear to auscultation bilaterally, normal work of breathing GI: soft, nontender, nondistended, + BS MS: no deformity or atrophy Skin: warm and dry  Neuro:  Strength and sensation are intact Psych: euthymic mood, full affect  EKG:  EKG is ordered today. The ekg ordered today shows sinus rhythm, 72 bpm   Recent Labs: 03/09/2014: Pro B Natriuretic peptide (BNP) 127.2 08/23/2014: ALT 22; TSH 1.12 02/04/2015: BUN 22; Creatinine 1.23; Hemoglobin 15.1; Platelets 230.0; Potassium 4.3; Sodium 139    Lipid Panel     Component Value Date/Time   CHOL 129 08/23/2014 0934   TRIG 109.0 08/23/2014 0934   HDL 33.50* 08/23/2014 0934   CHOLHDL 4 08/23/2014 0934   VLDL 21.8 08/23/2014 0934   LDLCALC 74 08/23/2014 0934     Wt Readings from Last 3 Encounters:  02/10/15 216 lb (97.977 kg)  02/04/15 215 lb (97.523 kg)  12/04/14 205 lb (92.987 kg)     ASSESSMENT AND PLAN:  1.  Paroxysmal atrial fibrillation Doing well s/p ablation No changes today Continue coumadin  2. htn Stable No change required today  Follow-up:  Return to see me in 3 months, follow-up with Dr Percival Spanish in 6 months  Signed, [name], MD  02/10/2015 10:35 AM     [place] [address] [place] [address] [phone] (office) [phone] (fax)

## 2015-03-10 ENCOUNTER — Ambulatory Visit (INDEPENDENT_AMBULATORY_CARE_PROVIDER_SITE_OTHER): Payer: Medicare Other | Admitting: *Deleted

## 2015-03-10 DIAGNOSIS — I4891 Unspecified atrial fibrillation: Secondary | ICD-10-CM

## 2015-03-10 DIAGNOSIS — Z5181 Encounter for therapeutic drug level monitoring: Secondary | ICD-10-CM | POA: Diagnosis not present

## 2015-03-10 DIAGNOSIS — Z7901 Long term (current) use of anticoagulants: Secondary | ICD-10-CM | POA: Diagnosis not present

## 2015-03-10 LAB — POCT INR: INR: 2.1

## 2015-04-02 DIAGNOSIS — N281 Cyst of kidney, acquired: Secondary | ICD-10-CM | POA: Diagnosis not present

## 2015-04-02 DIAGNOSIS — N200 Calculus of kidney: Secondary | ICD-10-CM | POA: Diagnosis not present

## 2015-04-02 DIAGNOSIS — N138 Other obstructive and reflux uropathy: Secondary | ICD-10-CM | POA: Diagnosis not present

## 2015-04-02 DIAGNOSIS — R3912 Poor urinary stream: Secondary | ICD-10-CM | POA: Diagnosis not present

## 2015-04-02 DIAGNOSIS — N401 Enlarged prostate with lower urinary tract symptoms: Secondary | ICD-10-CM | POA: Diagnosis not present

## 2015-04-03 DIAGNOSIS — H4011X2 Primary open-angle glaucoma, moderate stage: Secondary | ICD-10-CM | POA: Diagnosis not present

## 2015-04-09 ENCOUNTER — Ambulatory Visit (INDEPENDENT_AMBULATORY_CARE_PROVIDER_SITE_OTHER): Payer: Medicare Other | Admitting: *Deleted

## 2015-04-09 DIAGNOSIS — Z7901 Long term (current) use of anticoagulants: Secondary | ICD-10-CM | POA: Diagnosis not present

## 2015-04-09 DIAGNOSIS — Z5181 Encounter for therapeutic drug level monitoring: Secondary | ICD-10-CM

## 2015-04-09 DIAGNOSIS — I4891 Unspecified atrial fibrillation: Secondary | ICD-10-CM | POA: Diagnosis not present

## 2015-04-09 LAB — POCT INR: INR: 2.2

## 2015-04-30 ENCOUNTER — Other Ambulatory Visit: Payer: Self-pay | Admitting: Cardiology

## 2015-05-05 ENCOUNTER — Other Ambulatory Visit: Payer: Self-pay

## 2015-05-14 ENCOUNTER — Ambulatory Visit (INDEPENDENT_AMBULATORY_CARE_PROVIDER_SITE_OTHER): Payer: Medicare Other | Admitting: Internal Medicine

## 2015-05-14 ENCOUNTER — Ambulatory Visit (INDEPENDENT_AMBULATORY_CARE_PROVIDER_SITE_OTHER): Payer: Medicare Other | Admitting: *Deleted

## 2015-05-14 ENCOUNTER — Encounter: Payer: Self-pay | Admitting: Internal Medicine

## 2015-05-14 VITALS — BP 124/66 | HR 63 | Ht 71.0 in | Wt 213.0 lb

## 2015-05-14 DIAGNOSIS — I4891 Unspecified atrial fibrillation: Secondary | ICD-10-CM

## 2015-05-14 DIAGNOSIS — I10 Essential (primary) hypertension: Secondary | ICD-10-CM

## 2015-05-14 DIAGNOSIS — Z7901 Long term (current) use of anticoagulants: Secondary | ICD-10-CM | POA: Diagnosis not present

## 2015-05-14 DIAGNOSIS — Z5181 Encounter for therapeutic drug level monitoring: Secondary | ICD-10-CM

## 2015-05-14 LAB — POCT INR: INR: 1.8

## 2015-05-14 NOTE — Patient Instructions (Signed)
Medication Instructions: - no changes  Labwork: - none  Procedures/Testing: - none  Follow-Up: - Your physician wants you to follow-up in: 3 months with Dr. Percival Spanish & 6 months with Dr. [name]. You will receive a reminder letter in the mail two months in advance. If you don't receive a letter, please call our office to schedule the follow-up appointment.  Any Additional Special Instructions Will Be Listed Below (If Applicable). - none

## 2015-05-14 NOTE — Progress Notes (Signed)
Electrophysiology Office Note   Date:  05/14/2015   ID:  [name] [name], DOB 24-Jul-1929, MRN [id number]  PCP:  [name], MD  Cardiologist:  Dr Percival Spanish Primary Electrophysiologist: [name], MD    Chief Complaint  Patient presents with  . PAF     History of Present Illness: OTHO [name] is a 81 y.o. male who presents today for electrophysiology evaluation.  He reports doing well.  He did have 1 episode of afib lasting about 5 hours several days ago after getting frustrated over an incident with his spouse.  He has had no further afib.  He remains pleased with results of his ablation. Today, he denies symptoms of palpitations, chest pain, orthopnea, PND, lower extremity edema, claudication, dizziness, presyncope, syncope, bleeding, or neurologic sequela. The patient is tolerating medications without difficulties and is otherwise without complaint today.    Past Medical History  Diagnosis Date  . Allergic rhinitis   . Hyperlipidemia   . HTN (hypertension)   . Low back pain   . Paroxysmal atrial fibrillation     chads2vasc score is at least 4  . GERD (gastroesophageal reflux disease)   . Glaucoma   . Hx of colonic polyps     tubular adenoma   . Nephrolithiasis   . Hemorrhoids     internal  . Pancolonic diverticulosis   . Cancer     FACIAL MELONOMA  . Cataract     REMOVED  . Kidney stone   . Coronary artery disease   . Myocardial infarction    Past Surgical History  Procedure Laterality Date  . Cholecystectomy  02/1990    Dr. [name]  . Vasectomy    . Right inguinal hernia repair  04/1990    Dr. [name]  . Refractive surgery    . Ruptured blood vessel in the bladder    . Removed 2 stones from left ureter    . Blood vessel repair in bladder    . Colonoscopy  06-29-12    per Dr. [name], adenomatous polyps, repeat in 3 yrs   . Tee without cardioversion N/A 11/11/2014    Procedure: TRANSESOPHAGEAL ECHOCARDIOGRAM (TEE);  Surgeon: [name], MD;   Location: [facility] ENDOSCOPY;  Service: Cardiovascular;  Laterality: N/A;  needs INR before case  . Atrial fibrillation ablation N/A 11/12/2014    Procedure: ATRIAL FIBRILLATION ABLATION;  Surgeon: [name], MD;  Location: [facility] CATH LAB;  Service: Cardiovascular;  Laterality: N/A;     Current Outpatient Prescriptions  Medication Sig Dispense Refill  . acetaminophen (TYLENOL) 325 MG tablet Take 325 mg by mouth 2 (two) times daily.     . cetirizine (ZYRTEC) 10 MG tablet Take 10 mg by mouth every morning.     . latanoprost (XALATAN) 0.005 % ophthalmic solution Place 1 drop into both eyes at bedtime.     Marland Kitchen lisinopril (PRINIVIL,ZESTRIL) 10 MG tablet Take 10 mg by mouth at bedtime.     Marland Kitchen loratadine (CLARITIN) 10 MG tablet Take 10 mg by mouth at bedtime.     . metoprolol (LOPRESSOR) 50 MG tablet Take 0.5 tablets (25 mg total) by mouth 2 (two) times daily. 90 tablet 3  . mometasone (NASONEX) 50 MCG/ACT nasal spray Place 2 sprays into both nostrils daily as needed (allergies).     . Multiple Vitamins-Minerals (CENTRUM SILVER PO) Take 1 tablet by mouth every morning.     Marland Kitchen omeprazole (PRILOSEC) 20 MG capsule Take 20 mg by mouth 2 (two)  times daily.     . potassium chloride (KLOR-CON) 10 MEQ CR tablet Take 10 mEq by mouth every morning.     . Psyllium (METAMUCIL PO) Take 30 mLs by mouth 2 (two) times daily.     . simvastatin (ZOCOR) 40 MG tablet Take 40 mg by mouth at bedtime.      . tamsulosin (FLOMAX) 0.4 MG CAPS Take 0.4 mg by mouth 2 (two) times daily.     Marland Kitchen warfarin (COUMADIN) 5 MG tablet TAKE 1 TABLET BY MOUTH ONCE DAILY OR AS DIRECTED BY COUMADIN CLINIC 30 tablet 3   No current facility-administered medications for this visit.    Allergies:   Sulfa antibiotics and Sulfamethoxazole   Social History:  The patient  reports that he has never smoked. He has never used smokeless tobacco. He reports that he does not drink alcohol or use illicit drugs.   Family History:  The patient's  family history  includes Heart attack (age of onset: 63) in his father; Heart disease in his father; Ovarian cancer in his mother. There is no history of Colon cancer.    ROS:  Please see the history of present illness.   All other systems are reviewed and negative.    PHYSICAL EXAM: VS:  BP 124/66 mmHg  Pulse 63  Ht 5\' 11"  (1.803 m)  Wt 96.616 kg (213 lb)  BMI 29.72 kg/m2 , BMI Body mass index is 29.72 kg/(m^2). GEN: Well nourished, well developed, in no acute distress HEENT: normal Neck: no JVD, carotid bruits, or masses Cardiac: RRR; no murmurs, rubs, or gallops,no edema  Respiratory:  clear to auscultation bilaterally, normal work of breathing GI: soft, nontender, nondistended, + BS MS: no deformity or atrophy Skin: warm and dry  Neuro:  Strength and sensation are intact Psych: euthymic mood, full affect  EKG:  EKG is ordered today. The ekg ordered today shows sinus rhythm    Recent Labs: 08/23/2014: ALT 22; TSH 1.12 02/04/2015: BUN 22; Creatinine, Ser 1.23; Hemoglobin 15.1; Platelets 230.0; Potassium 4.3; Sodium 139    Lipid Panel     Component Value Date/Time   CHOL 129 08/23/2014 0934   TRIG 109.0 08/23/2014 0934   HDL 33.50* 08/23/2014 0934   CHOLHDL 4 08/23/2014 0934   VLDL 21.8 08/23/2014 0934   LDLCALC 74 08/23/2014 0934     Wt Readings from Last 3 Encounters:  05/14/15 96.616 kg (213 lb)  02/10/15 97.977 kg (216 lb)  02/04/15 97.523 kg (215 lb)     ASSESSMENT AND PLAN:  1.  Paroxysmal atrial fibrillation Doing well s/p ablation No changes today Continue coumadin  2. htn Stable No change required today  Follow-up:  Return to see Dr Percival Spanish in 3 months I will see in 6 months  Signed, [name], MD  05/14/2015 2:09 PM     [place] [address] [place] [address] [phone] (office) [phone] (fax)

## 2015-06-11 ENCOUNTER — Ambulatory Visit (INDEPENDENT_AMBULATORY_CARE_PROVIDER_SITE_OTHER): Payer: Medicare Other | Admitting: *Deleted

## 2015-06-11 DIAGNOSIS — I4891 Unspecified atrial fibrillation: Secondary | ICD-10-CM | POA: Diagnosis not present

## 2015-06-11 DIAGNOSIS — Z5181 Encounter for therapeutic drug level monitoring: Secondary | ICD-10-CM | POA: Diagnosis not present

## 2015-06-11 DIAGNOSIS — Z7901 Long term (current) use of anticoagulants: Secondary | ICD-10-CM | POA: Diagnosis not present

## 2015-06-11 LAB — POCT INR: INR: 2

## 2015-07-02 DIAGNOSIS — D225 Melanocytic nevi of trunk: Secondary | ICD-10-CM | POA: Diagnosis not present

## 2015-07-02 DIAGNOSIS — L814 Other melanin hyperpigmentation: Secondary | ICD-10-CM | POA: Diagnosis not present

## 2015-07-02 DIAGNOSIS — Z85820 Personal history of malignant melanoma of skin: Secondary | ICD-10-CM | POA: Diagnosis not present

## 2015-07-02 DIAGNOSIS — Z08 Encounter for follow-up examination after completed treatment for malignant neoplasm: Secondary | ICD-10-CM | POA: Diagnosis not present

## 2015-07-02 DIAGNOSIS — L570 Actinic keratosis: Secondary | ICD-10-CM | POA: Diagnosis not present

## 2015-07-07 DIAGNOSIS — Z961 Presence of intraocular lens: Secondary | ICD-10-CM | POA: Diagnosis not present

## 2015-07-07 DIAGNOSIS — H4011X1 Primary open-angle glaucoma, mild stage: Secondary | ICD-10-CM | POA: Diagnosis not present

## 2015-07-16 ENCOUNTER — Ambulatory Visit (INDEPENDENT_AMBULATORY_CARE_PROVIDER_SITE_OTHER): Payer: Medicare Other | Admitting: *Deleted

## 2015-07-16 DIAGNOSIS — I4891 Unspecified atrial fibrillation: Secondary | ICD-10-CM

## 2015-07-16 DIAGNOSIS — Z5181 Encounter for therapeutic drug level monitoring: Secondary | ICD-10-CM | POA: Diagnosis not present

## 2015-07-16 DIAGNOSIS — Z7901 Long term (current) use of anticoagulants: Secondary | ICD-10-CM | POA: Diagnosis not present

## 2015-07-16 LAB — POCT INR: INR: 2.3

## 2015-07-24 ENCOUNTER — Encounter: Payer: Self-pay | Admitting: Internal Medicine

## 2015-07-29 ENCOUNTER — Encounter: Payer: Self-pay | Admitting: Internal Medicine

## 2015-08-05 ENCOUNTER — Encounter: Payer: Self-pay | Admitting: Cardiology

## 2015-08-05 ENCOUNTER — Other Ambulatory Visit: Payer: Self-pay | Admitting: Cardiology

## 2015-08-14 ENCOUNTER — Ambulatory Visit: Payer: Medicare Other | Admitting: Cardiology

## 2015-08-25 ENCOUNTER — Encounter: Payer: Self-pay | Admitting: Family Medicine

## 2015-08-25 ENCOUNTER — Ambulatory Visit (INDEPENDENT_AMBULATORY_CARE_PROVIDER_SITE_OTHER): Payer: Medicare Other | Admitting: Family Medicine

## 2015-08-25 VITALS — BP 136/72 | HR 63 | Temp 98.2°F | Ht 71.0 in | Wt 209.0 lb

## 2015-08-25 DIAGNOSIS — K219 Gastro-esophageal reflux disease without esophagitis: Secondary | ICD-10-CM

## 2015-08-25 DIAGNOSIS — N401 Enlarged prostate with lower urinary tract symptoms: Secondary | ICD-10-CM

## 2015-08-25 DIAGNOSIS — I4891 Unspecified atrial fibrillation: Secondary | ICD-10-CM | POA: Diagnosis not present

## 2015-08-25 DIAGNOSIS — Z23 Encounter for immunization: Secondary | ICD-10-CM | POA: Diagnosis not present

## 2015-08-25 DIAGNOSIS — I10 Essential (primary) hypertension: Secondary | ICD-10-CM | POA: Diagnosis not present

## 2015-08-25 DIAGNOSIS — E785 Hyperlipidemia, unspecified: Secondary | ICD-10-CM | POA: Diagnosis not present

## 2015-08-25 DIAGNOSIS — N138 Other obstructive and reflux uropathy: Secondary | ICD-10-CM

## 2015-08-25 LAB — CBC WITH DIFFERENTIAL/PLATELET
Basophils Absolute: 0 10*3/uL (ref 0.0–0.1)
Basophils Relative: 0.3 % (ref 0.0–3.0)
Eosinophils Absolute: 0.3 10*3/uL (ref 0.0–0.7)
Eosinophils Relative: 3.5 % (ref 0.0–5.0)
HCT: 42 % (ref 39.0–52.0)
Hemoglobin: 13.9 g/dL (ref 13.0–17.0)
Lymphocytes Relative: 26.2 % (ref 12.0–46.0)
Lymphs Abs: 2.5 10*3/uL (ref 0.7–4.0)
MCHC: 33 g/dL (ref 30.0–36.0)
MCV: 96.1 fl (ref 78.0–100.0)
Monocytes Absolute: 0.8 10*3/uL (ref 0.1–1.0)
Monocytes Relative: 8.7 % (ref 3.0–12.0)
Neutro Abs: 5.9 10*3/uL (ref 1.4–7.7)
Neutrophils Relative %: 61.3 % (ref 43.0–77.0)
Platelets: 198 10*3/uL (ref 150.0–400.0)
RBC: 4.37 Mil/uL (ref 4.22–5.81)
RDW: 13.6 % (ref 11.5–15.5)
WBC: 9.6 10*3/uL (ref 4.0–10.5)

## 2015-08-25 LAB — TSH: TSH: 1.31 u[IU]/mL (ref 0.35–4.50)

## 2015-08-25 LAB — POCT URINALYSIS DIPSTICK
Bilirubin, UA: NEGATIVE
Glucose, UA: NEGATIVE
Ketones, UA: NEGATIVE
Nitrite, UA: NEGATIVE
Spec Grav, UA: 1.025
Urobilinogen, UA: 0.2
pH, UA: 6

## 2015-08-25 LAB — HEPATIC FUNCTION PANEL
ALT: 22 U/L (ref 0–53)
AST: 19 U/L (ref 0–37)
Albumin: 4.6 g/dL (ref 3.5–5.2)
Alkaline Phosphatase: 56 U/L (ref 39–117)
Bilirubin, Direct: 0.2 mg/dL (ref 0.0–0.3)
Total Bilirubin: 0.8 mg/dL (ref 0.2–1.2)
Total Protein: 6.9 g/dL (ref 6.0–8.3)

## 2015-08-25 LAB — LIPID PANEL
Cholesterol: 171 mg/dL (ref 0–200)
HDL: 44.1 mg/dL (ref >39.00)
LDL Cholesterol: 91 mg/dL (ref 0–99)
NonHDL: 126.95
TRIGLYCERIDES: 179 mg/dL — AB (ref 0.0–149.0)
Total CHOL/HDL Ratio: 4
VLDL: 35.8 mg/dL (ref 0.0–40.0)

## 2015-08-25 LAB — BASIC METABOLIC PANEL
BUN: 19 mg/dL (ref 6–23)
CO2: 33 mEq/L — ABNORMAL HIGH (ref 19–32)
Calcium: 9.8 mg/dL (ref 8.4–10.5)
Chloride: 100 mEq/L (ref 96–112)
Creatinine, Ser: 1.17 mg/dL (ref 0.40–1.50)
GFR: 63.1 mL/min (ref >60.00)
GLUCOSE: 104 mg/dL — AB (ref 70–99)
POTASSIUM: 4.7 meq/L (ref 3.5–5.1)
Sodium: 142 mEq/L (ref 135–145)

## 2015-08-25 LAB — PSA: PSA: 1.34 ng/mL (ref 0.10–4.00)

## 2015-08-25 NOTE — Progress Notes (Signed)
Pre visit review using our clinic review tool, if applicable. No additional management support is needed unless otherwise documented below in the visit note. 

## 2015-08-25 NOTE — Addendum Note (Signed)
Addended by: Aggie Hacker A on: 08/25/2015 09:50 AM   Modules accepted: Orders

## 2015-08-25 NOTE — Progress Notes (Signed)
   Subjective:    Patient ID: [name] [name], male    DOB: 06-21-1929, 81 y.o.   MRN: [id number]  HPI 81 yr old male for a follow visit. He feels well and has no complaints. He saw Dr. Junious Silk 2 months ago and had a normal exam. He sees Cardiology frequently, and is atrial fibrillation has been stable. He has a meeting with Dr. [name] soon to decide whether he needs another colonoscopy or not.    Review of Systems  Constitutional: Negative.   HENT: Negative.   Eyes: Negative.   Respiratory: Negative.   Cardiovascular: Negative.   Gastrointestinal: Negative.   Genitourinary: Negative.   Musculoskeletal: Negative.   Skin: Negative.   Neurological: Negative.   Psychiatric/Behavioral: Negative.        Objective:   Physical Exam  Constitutional: He is oriented to person, place, and time. He appears well-developed and well-nourished. No distress.  HENT:  Head: Normocephalic and atraumatic.  Right Ear: External ear normal.  Left Ear: External ear normal.  Nose: Nose normal.  Mouth/Throat: Oropharynx is clear and moist. No oropharyngeal exudate.  Eyes: Conjunctivae and EOM are normal. Pupils are equal, round, and reactive to light. Right eye exhibits no discharge. Left eye exhibits no discharge. No scleral icterus.  Neck: Neck supple. No JVD present. No tracheal deviation present. No thyromegaly present.  Cardiovascular: Normal rate, regular rhythm, normal heart sounds and intact distal pulses.  Exam reveals no gallop and no friction rub.   No murmur heard. Pulmonary/Chest: Effort normal and breath sounds normal. No respiratory distress. He has no wheezes. He has no rales. He exhibits no tenderness.  Abdominal: Soft. Bowel sounds are normal. He exhibits no distension and no mass. There is no tenderness. There is no rebound and no guarding.  Musculoskeletal: Normal range of motion. He exhibits no edema or tenderness.  Lymphadenopathy:    He has no cervical adenopathy.  Neurological:  He is alert and oriented to person, place, and time. He has normal reflexes. No cranial nerve deficit. He exhibits normal muscle tone. Coordination normal.  Skin: Skin is warm and dry. No rash noted. He is not diaphoretic. No erythema. No pallor.  Psychiatric: He has a normal mood and affect. His behavior is normal. Judgment and thought content normal.          Assessment & Plan:  His HTN is stable. Gets fasting labs. Check a PSA and we will forward this to Dr. Junious Silk. He will see GI as above. His atrial fibrillation is well controlled.

## 2015-08-27 ENCOUNTER — Ambulatory Visit (INDEPENDENT_AMBULATORY_CARE_PROVIDER_SITE_OTHER): Payer: Medicare Other | Admitting: Pharmacist

## 2015-08-27 DIAGNOSIS — Z5181 Encounter for therapeutic drug level monitoring: Secondary | ICD-10-CM | POA: Diagnosis not present

## 2015-08-27 DIAGNOSIS — Z7901 Long term (current) use of anticoagulants: Secondary | ICD-10-CM | POA: Diagnosis not present

## 2015-08-27 DIAGNOSIS — I4891 Unspecified atrial fibrillation: Secondary | ICD-10-CM

## 2015-08-27 LAB — POCT INR: INR: 2

## 2015-09-15 ENCOUNTER — Ambulatory Visit: Payer: Medicare Other | Admitting: Cardiology

## 2015-09-16 ENCOUNTER — Encounter: Payer: Self-pay | Admitting: Cardiology

## 2015-09-16 ENCOUNTER — Ambulatory Visit (INDEPENDENT_AMBULATORY_CARE_PROVIDER_SITE_OTHER): Payer: Medicare Other | Admitting: Cardiology

## 2015-09-16 VITALS — BP 142/76 | HR 63 | Ht 71.0 in | Wt 210.6 lb

## 2015-09-16 DIAGNOSIS — I481 Persistent atrial fibrillation: Secondary | ICD-10-CM

## 2015-09-16 DIAGNOSIS — I4819 Other persistent atrial fibrillation: Secondary | ICD-10-CM

## 2015-09-16 NOTE — Progress Notes (Signed)
HPI The patient presents for evaluation of atrial fibrillation.  He is status post ablation. He's had 2 recurrence episodes but none in quite a while. He's doing very well. He is exercising on a treadmill. The patient denies any new symptoms such as chest discomfort, neck or arm discomfort. There has been no new shortness of breath, PND or orthopnea. There have been no reported palpitations, presyncope or syncope.   Allergies  Allergen Reactions  . Sulfa Antibiotics Other (See Comments)    Severe peeling of the skin on the groin area  . Sulfamethoxazole     REACTION: rash    Current Outpatient Prescriptions  Medication Sig Dispense Refill  . acetaminophen (TYLENOL) 325 MG tablet Take 325 mg by mouth 2 (two) times daily.     . cetirizine (ZYRTEC) 10 MG tablet Take 10 mg by mouth every morning.     . latanoprost (XALATAN) 0.005 % ophthalmic solution Place 1 drop into both eyes at bedtime.     Marland Kitchen lisinopril (PRINIVIL,ZESTRIL) 10 MG tablet Take 10 mg by mouth at bedtime.     Marland Kitchen loratadine (CLARITIN) 10 MG tablet Take 10 mg by mouth at bedtime.     . metoprolol (LOPRESSOR) 50 MG tablet Take 0.5 tablets (25 mg total) by mouth 2 (two) times daily. 90 tablet 3  . mometasone (NASONEX) 50 MCG/ACT nasal spray Place 2 sprays into both nostrils daily as needed (allergies).     . Multiple Vitamins-Minerals (CENTRUM SILVER PO) Take 1 tablet by mouth every morning.     Marland Kitchen omeprazole (PRILOSEC) 20 MG capsule Take 20 mg by mouth 2 (two) times daily.     . potassium chloride (KLOR-CON) 10 MEQ CR tablet Take 10 mEq by mouth every morning.     . Psyllium (METAMUCIL PO) Take 30 mLs by mouth 2 (two) times daily.     . simvastatin (ZOCOR) 40 MG tablet Take 40 mg by mouth at bedtime.      . tamsulosin (FLOMAX) 0.4 MG CAPS Take 0.4 mg by mouth 2 (two) times daily.     Marland Kitchen warfarin (COUMADIN) 5 MG tablet TAKE 1 TABLET BY MOUTH ONCE DAILY OR AS DIRECTED BY COUMADIN CLINIC 30 tablet 5   No current  facility-administered medications for this visit.    Past Medical History  Diagnosis Date  . Allergic rhinitis   . Hyperlipidemia   . HTN (hypertension)   . Low back pain   . Paroxysmal atrial fibrillation (HCC)     chads2vasc score is at least 4  . GERD (gastroesophageal reflux disease)   . Glaucoma   . Hx of colonic polyps     tubular adenoma   . Nephrolithiasis   . Hemorrhoids     internal  . Pancolonic diverticulosis   . Cancer ([place])     FACIAL MELONOMA  . Cataract     REMOVED  . Kidney stone   . Coronary artery disease   . Myocardial infarction [facility])     Past Surgical History  Procedure Laterality Date  . Cholecystectomy  02/1990    Dr. [name]  . Vasectomy    . Right inguinal hernia repair  04/1990    Dr. [name]  . Refractive surgery    . Ruptured blood vessel in the bladder    . Removed 2 stones from left ureter    . Blood vessel repair in bladder    . Colonoscopy  06-29-12    per Dr. [name], adenomatous polyps,  repeat in 3 yrs   . Tee without cardioversion N/A 11/11/2014    Procedure: TRANSESOPHAGEAL ECHOCARDIOGRAM (TEE);  Surgeon: [name], MD;  Location: [facility] ENDOSCOPY;  Service: Cardiovascular;  Laterality: N/A;  needs INR before case  . Atrial fibrillation ablation N/A 11/12/2014    Procedure: ATRIAL FIBRILLATION ABLATION;  Surgeon: [name], MD;  Location: [facility] CATH LAB;  Service: Cardiovascular;  Laterality: N/A;    ROS:  As stated in the HPI and negative for all other systems.   PHYSICAL EXAM BP 142/76 mmHg  Pulse 63  Ht 5\' 11"  (1.803 m)  Wt 210 lb 9.6 oz (95.528 kg)  BMI 29.39 kg/m2 GENERAL:  Well appearing NECK:  No jugular venous distention, waveform within normal limits, carotid upstroke brisk and symmetric, no bruits, no thyromegaly LUNGS:  Clear to auscultation bilaterally BACK:  No CVA tenderness CHEST:  Unremarkable HEART:  PMI not displaced or sustained,S1 and S2 within normal limits, no S3, no clicks, no rubs, no murmurs,  irregular ABD:  Flat, positive bowel sounds normal in frequency in pitch, no bruits, no rebound, no guarding, no midline pulsatile mass, no hepatomegaly, no splenomegaly EXT:  2 plus pulses throughout, no edema, no cyanosis no clubbing  EKG:  Sinus bradycardia, rate 63, axis within normal limits, intervals within normal limits, poor anterior R wave progression.  09/16/2015  ASSESSMENT AND PLAN   ATRIAL FIBRILLATION -  [name] has a CHA2DS2 - VASc score of 3 with a risk of stroke of 3.3%.  He will continue follow up with Dr. [name] and he will continue warfarin.   HYPERTENSION -  I reviewed dietary and his blood pressure is well-controlled. He'll continue the meds as listed.

## 2015-09-16 NOTE — Patient Instructions (Signed)
Your physician wants you to follow-up in: 6 Months You will receive a reminder letter in the mail two months in advance. If you don't receive a letter, please call our office to schedule the follow-up appointment.  

## 2015-10-07 ENCOUNTER — Ambulatory Visit (INDEPENDENT_AMBULATORY_CARE_PROVIDER_SITE_OTHER): Payer: Medicare Other | Admitting: Internal Medicine

## 2015-10-07 ENCOUNTER — Encounter: Payer: Self-pay | Admitting: Internal Medicine

## 2015-10-07 VITALS — BP 125/60 | HR 72 | Ht 71.0 in | Wt 216.0 lb

## 2015-10-07 DIAGNOSIS — I481 Persistent atrial fibrillation: Secondary | ICD-10-CM | POA: Diagnosis not present

## 2015-10-07 DIAGNOSIS — Z860100 Personal history of colon polyps, unspecified: Secondary | ICD-10-CM

## 2015-10-07 DIAGNOSIS — Z860101 Personal history of adenomatous and serrated colon polyps: Secondary | ICD-10-CM

## 2015-10-07 DIAGNOSIS — Z7901 Long term (current) use of anticoagulants: Secondary | ICD-10-CM

## 2015-10-07 DIAGNOSIS — I4819 Other persistent atrial fibrillation: Secondary | ICD-10-CM

## 2015-10-07 NOTE — Progress Notes (Signed)
   Subjective:    Patient ID: [name] [name], male    DOB: 17-May-1929, 81 y.o.   MRN: [id number] Cc: hx colon polyps HPI Last colonoscopy 2013 w/ 3 adenomas max 10 mm - hx polyps before  MNo GI problems except occ constipation No anemia  Remains on warfarin w/ hx Afib - no Afib s/p ablation  Medications, allergies, past medical history, past surgical history, family history and social history are reviewed and updated in the EMR. Review of Systems As above    Objective:   Physical Exam BP 125/60 mmHg  Pulse 72  Ht 5\' 11"  (1.803 m)  Wt 216 lb (97.977 kg)  BMI 30.14 kg/m2 NAD Vigorous, younger than age    Assessment & Plan:  Hx of adenomatous colonic polyps  Persistent atrial fibrillation (HCC)  Warfarin anticoagulation  I do not think risk/benefit ratio for routine colonoscopy in his favor - he accepts and will be evaluated prn

## 2015-10-07 NOTE — Patient Instructions (Signed)
   Follow up with Dr Gessner as needed.   I appreciate the opportunity to care for you. Jedi Gessner, MD, FACG 

## 2015-10-08 ENCOUNTER — Ambulatory Visit (INDEPENDENT_AMBULATORY_CARE_PROVIDER_SITE_OTHER): Payer: Medicare Other | Admitting: Pharmacist

## 2015-10-08 DIAGNOSIS — I4819 Other persistent atrial fibrillation: Secondary | ICD-10-CM

## 2015-10-08 DIAGNOSIS — I481 Persistent atrial fibrillation: Secondary | ICD-10-CM

## 2015-10-08 DIAGNOSIS — Z5181 Encounter for therapeutic drug level monitoring: Secondary | ICD-10-CM

## 2015-10-08 DIAGNOSIS — Z7901 Long term (current) use of anticoagulants: Secondary | ICD-10-CM

## 2015-10-08 DIAGNOSIS — I4891 Unspecified atrial fibrillation: Secondary | ICD-10-CM | POA: Diagnosis not present

## 2015-10-08 LAB — POCT INR: INR: 2.3

## 2015-11-12 ENCOUNTER — Ambulatory Visit (INDEPENDENT_AMBULATORY_CARE_PROVIDER_SITE_OTHER): Payer: Medicare Other | Admitting: Internal Medicine

## 2015-11-12 ENCOUNTER — Encounter: Payer: Self-pay | Admitting: Internal Medicine

## 2015-11-12 ENCOUNTER — Ambulatory Visit (INDEPENDENT_AMBULATORY_CARE_PROVIDER_SITE_OTHER): Payer: Medicare Other | Admitting: Pharmacist

## 2015-11-12 VITALS — BP 138/78 | HR 64 | Ht 71.0 in | Wt 216.8 lb

## 2015-11-12 DIAGNOSIS — Z7901 Long term (current) use of anticoagulants: Secondary | ICD-10-CM | POA: Diagnosis not present

## 2015-11-12 DIAGNOSIS — I4891 Unspecified atrial fibrillation: Secondary | ICD-10-CM

## 2015-11-12 DIAGNOSIS — Z5181 Encounter for therapeutic drug level monitoring: Secondary | ICD-10-CM

## 2015-11-12 DIAGNOSIS — I10 Essential (primary) hypertension: Secondary | ICD-10-CM | POA: Diagnosis not present

## 2015-11-12 LAB — POCT INR: INR: 2.5

## 2015-11-12 NOTE — Progress Notes (Signed)
Electrophysiology Office Note   Date:  11/12/2015   ID:  BURRELL WIES, DOB [birth year], MRN [id number]  PCP:  [name], MD  Cardiologist:  Dr Percival Spanish Primary Electrophysiologist: [name], MD    Chief Complaint  Patient presents with  . Atrial Fibrillation     History of Present Illness: [name] [name] is a 82 y.o. male who presents today for electrophysiology evaluation.  He reports doing well.    He remains pleased with results of his ablation. Today, he denies symptoms of palpitations, chest pain, orthopnea, PND, lower extremity edema, claudication, dizziness, presyncope, syncope, bleeding, or neurologic sequela. The patient is tolerating medications without difficulties and is otherwise without complaint today.    Past Medical History  Diagnosis Date  . Allergic rhinitis   . Hyperlipidemia   . HTN (hypertension)   . Low back pain   . Paroxysmal atrial fibrillation (HCC)     chads2vasc score is at least 4  . GERD (gastroesophageal reflux disease)   . Glaucoma   . Hx of colonic polyps     tubular adenoma   . Nephrolithiasis   . Hemorrhoids     internal  . Pancolonic diverticulosis   . Cancer ([place])     FACIAL MELONOMA  . Cataract     REMOVED  . Kidney stone   . Coronary artery disease   . Myocardial infarction [facility])    Past Surgical History  Procedure Laterality Date  . Cholecystectomy  02/1990    Dr. [name]  . Vasectomy    . Right inguinal hernia repair  04/1990    Dr. [name]  . Refractive surgery    . Ruptured blood vessel in the bladder    . Removed 2 stones from left ureter    . Blood vessel repair in bladder    . Colonoscopy  06-29-12    per Dr. [name], adenomatous polyps, repeat in 3 yrs   . Tee without cardioversion N/A 11/11/2014    Procedure: TRANSESOPHAGEAL ECHOCARDIOGRAM (TEE);  Surgeon: [name], MD;  Location: [facility] ENDOSCOPY;  Service: Cardiovascular;  Laterality: N/A;  needs INR before case  . Atrial fibrillation ablation  N/A 11/12/2014    Procedure: ATRIAL FIBRILLATION ABLATION;  Surgeon: [name], MD;  Location: [facility] CATH LAB;  Service: Cardiovascular;  Laterality: N/A;     Current Outpatient Prescriptions  Medication Sig Dispense Refill  . acetaminophen (TYLENOL) 325 MG tablet Take 325 mg by mouth 2 (two) times daily.     . cetirizine (ZYRTEC) 10 MG tablet Take 10 mg by mouth every morning.     . latanoprost (XALATAN) 0.005 % ophthalmic solution Place 1 drop into both eyes at bedtime.     Marland Kitchen lisinopril (PRINIVIL,ZESTRIL) 10 MG tablet Take 10 mg by mouth at bedtime.     Marland Kitchen loratadine (CLARITIN) 10 MG tablet Take 10 mg by mouth at bedtime.     . metoprolol (LOPRESSOR) 50 MG tablet Take 0.5 tablets (25 mg total) by mouth 2 (two) times daily. 90 tablet 3  . mometasone (NASONEX) 50 MCG/ACT nasal spray Place 2 sprays into both nostrils daily as needed (allergies).     . Multiple Vitamins-Minerals (CENTRUM SILVER PO) Take 1 tablet by mouth every morning.     Marland Kitchen omeprazole (PRILOSEC) 20 MG capsule Take 20 mg by mouth 2 (two) times daily.     . potassium chloride (KLOR-CON) 10 MEQ CR tablet Take 10 mEq by mouth every morning.     Marland Kitchen  Psyllium (METAMUCIL PO) Take 30 mLs by mouth 2 (two) times daily.     . simvastatin (ZOCOR) 40 MG tablet Take 40 mg by mouth at bedtime.      . tamsulosin (FLOMAX) 0.4 MG CAPS Take 0.4 mg by mouth 2 (two) times daily.     Marland Kitchen warfarin (COUMADIN) 5 MG tablet TAKE 1 TABLET BY MOUTH ONCE DAILY OR AS DIRECTED BY COUMADIN CLINIC 30 tablet 5   No current facility-administered medications for this visit.    Allergies:   Sulfa antibiotics and Sulfamethoxazole   Social History:  The patient  reports that he has never smoked. He has never used smokeless tobacco. He reports that he does not drink alcohol or use illicit drugs.   Family History:  The patient's  family history includes Heart attack (age of onset: 45) in his father; Heart disease in his father; Ovarian cancer in his mother.    ROS:   Please see the history of present illness.   All other systems are reviewed and negative.    PHYSICAL EXAM: VS:  There were no vitals taken for this visit. , BMI There is no weight on file to calculate BMI. GEN: Well nourished, well developed, in no acute distress HEENT: normal Neck: no JVD, carotid bruits, or masses Cardiac: RRR; no murmurs, rubs, or gallops,no edema  Respiratory:  clear to auscultation bilaterally, normal work of breathing GI: soft, nontender, nondistended, + BS MS: no deformity or atrophy Skin: warm and dry  Neuro:  Strength and sensation are intact Psych: euthymic mood, full affect  EKG:  EKG is ordered today. The ekg ordered today shows sinus rhythm    Recent Labs: 08/25/2015: ALT 22; BUN 19; Creatinine, Ser 1.17; Hemoglobin 13.9; Platelets 198.0; Potassium 4.7; Sodium 142; TSH 1.31    Lipid Panel     Component Value Date/Time   CHOL 171 08/25/2015 0929   TRIG 179.0* 08/25/2015 0929   HDL 44.10 08/25/2015 0929   CHOLHDL 4 08/25/2015 0929   VLDL 35.8 08/25/2015 0929   LDLCALC 91 08/25/2015 0929     Wt Readings from Last 3 Encounters:  10/07/15 216 lb (97.977 kg)  09/16/15 210 lb 9.6 oz (95.528 kg)  08/25/15 209 lb (94.802 kg)     ASSESSMENT AND PLAN:  1.  Paroxysmal atrial fibrillation Doing well s/p ablation without any recent episodes of AF No changes today Continue coumadin, chads2vasc score is at least 3  2. htn Stable No change required today  Follow-up:  Return to see Dr Percival Spanish in 6 months I will see again in 1 year  Signed, [name], MD  11/12/2015 11:59 AM     [facility] [address] [place] [address] [phone] (office) [phone] (fax)

## 2015-11-12 NOTE — Patient Instructions (Signed)
Medication Instructions:  Your physician recommends that you continue on your current medications as directed. Please refer to the Current Medication list given to you today.   Labwork: None ordered   Testing/Procedures: None ordered   Follow-Up: Your physician wants you to follow-up in: 6 months with Dr Percival Spanish and 12 months with [name] Dennis Bast will receive a reminder letter in the mail two months in advance. If you don't receive a letter, please call our office to schedule the follow-up appointment.   Any Other Special Instructions Will Be Listed Below (If Applicable).     If you need a refill on your cardiac medications before your next appointment, please call your pharmacy.

## 2015-11-19 ENCOUNTER — Ambulatory Visit (INDEPENDENT_AMBULATORY_CARE_PROVIDER_SITE_OTHER): Payer: Medicare Other | Admitting: Family Medicine

## 2015-11-19 ENCOUNTER — Encounter: Payer: Self-pay | Admitting: Family Medicine

## 2015-11-19 VITALS — BP 150/79 | HR 62 | Temp 98.1°F | Ht 71.0 in | Wt 216.0 lb

## 2015-11-19 DIAGNOSIS — J019 Acute sinusitis, unspecified: Secondary | ICD-10-CM | POA: Diagnosis not present

## 2015-11-19 MED ORDER — LEVOFLOXACIN 500 MG PO TABS: 500.0000 mg | ORAL_TABLET | Freq: Every day | ORAL | Status: AC

## 2015-11-19 NOTE — Progress Notes (Signed)
Pre visit review using our clinic review tool, if applicable. No additional management support is needed unless otherwise documented below in the visit note. 

## 2015-11-19 NOTE — Progress Notes (Signed)
   Subjective:    Patient ID: [name] [name], male    DOB: 1928-11-24, 82 y.o.   MRN: [id number]  HPI Here for one week of sinus pressure, PND, and coughing up yellow sputum. No fever.    Review of Systems  Constitutional: Negative.   HENT: Positive for congestion and postnasal drip. Negative for sore throat.   Eyes: Negative.   Respiratory: Positive for cough.        Objective:   Physical Exam  Constitutional: He appears well-developed and well-nourished.  HENT:  Right Ear: External ear normal.  Left Ear: External ear normal.  Nose: Nose normal.  Mouth/Throat: Oropharynx is clear and moist.  Eyes: Conjunctivae are normal.  Pulmonary/Chest: Effort normal and breath sounds normal.  Lymphadenopathy:    He has no cervical adenopathy.          Assessment & Plan:  Sinusitis, treat with Levaquin.

## 2015-12-01 DIAGNOSIS — H401132 Primary open-angle glaucoma, bilateral, moderate stage: Secondary | ICD-10-CM | POA: Diagnosis not present

## 2015-12-24 ENCOUNTER — Ambulatory Visit (INDEPENDENT_AMBULATORY_CARE_PROVIDER_SITE_OTHER): Payer: Medicare Other | Admitting: *Deleted

## 2015-12-24 DIAGNOSIS — Z5181 Encounter for therapeutic drug level monitoring: Secondary | ICD-10-CM | POA: Diagnosis not present

## 2015-12-24 DIAGNOSIS — Z7901 Long term (current) use of anticoagulants: Secondary | ICD-10-CM

## 2015-12-24 DIAGNOSIS — I4891 Unspecified atrial fibrillation: Secondary | ICD-10-CM

## 2015-12-24 LAB — POCT INR: INR: 1.5

## 2015-12-31 DIAGNOSIS — L821 Other seborrheic keratosis: Secondary | ICD-10-CM | POA: Diagnosis not present

## 2015-12-31 DIAGNOSIS — L812 Freckles: Secondary | ICD-10-CM | POA: Diagnosis not present

## 2015-12-31 DIAGNOSIS — L570 Actinic keratosis: Secondary | ICD-10-CM | POA: Diagnosis not present

## 2015-12-31 DIAGNOSIS — Z85820 Personal history of malignant melanoma of skin: Secondary | ICD-10-CM | POA: Diagnosis not present

## 2016-01-07 ENCOUNTER — Ambulatory Visit (INDEPENDENT_AMBULATORY_CARE_PROVIDER_SITE_OTHER): Payer: Medicare Other | Admitting: *Deleted

## 2016-01-07 DIAGNOSIS — Z7901 Long term (current) use of anticoagulants: Secondary | ICD-10-CM | POA: Diagnosis not present

## 2016-01-07 DIAGNOSIS — I4891 Unspecified atrial fibrillation: Secondary | ICD-10-CM

## 2016-01-07 DIAGNOSIS — Z5181 Encounter for therapeutic drug level monitoring: Secondary | ICD-10-CM

## 2016-01-07 LAB — POCT INR: INR: 2.1

## 2016-02-03 ENCOUNTER — Other Ambulatory Visit: Payer: Self-pay | Admitting: Cardiology

## 2016-02-04 ENCOUNTER — Ambulatory Visit (INDEPENDENT_AMBULATORY_CARE_PROVIDER_SITE_OTHER): Payer: Medicare Other | Admitting: *Deleted

## 2016-02-04 DIAGNOSIS — Z5181 Encounter for therapeutic drug level monitoring: Secondary | ICD-10-CM

## 2016-02-04 DIAGNOSIS — I4891 Unspecified atrial fibrillation: Secondary | ICD-10-CM | POA: Diagnosis not present

## 2016-02-04 DIAGNOSIS — Z7901 Long term (current) use of anticoagulants: Secondary | ICD-10-CM

## 2016-02-04 LAB — POCT INR: INR: 2.4

## 2016-03-01 DIAGNOSIS — H401132 Primary open-angle glaucoma, bilateral, moderate stage: Secondary | ICD-10-CM | POA: Diagnosis not present

## 2016-03-03 ENCOUNTER — Ambulatory Visit (INDEPENDENT_AMBULATORY_CARE_PROVIDER_SITE_OTHER): Payer: Medicare Other | Admitting: Pharmacist

## 2016-03-03 DIAGNOSIS — Z5181 Encounter for therapeutic drug level monitoring: Secondary | ICD-10-CM

## 2016-03-03 DIAGNOSIS — Z7901 Long term (current) use of anticoagulants: Secondary | ICD-10-CM

## 2016-03-03 DIAGNOSIS — I4891 Unspecified atrial fibrillation: Secondary | ICD-10-CM | POA: Diagnosis not present

## 2016-03-03 LAB — POCT INR: INR: 2.6

## 2016-03-09 ENCOUNTER — Other Ambulatory Visit: Payer: Self-pay | Admitting: Cardiology

## 2016-03-09 ENCOUNTER — Telehealth: Payer: Self-pay | Admitting: Cardiology

## 2016-03-09 DIAGNOSIS — I6523 Occlusion and stenosis of bilateral carotid arteries: Secondary | ICD-10-CM

## 2016-03-09 NOTE — Telephone Encounter (Signed)
Returned call to patient. He is s/p ablation 1+ yr ago by Dr. [name]. No recent issues w/ a fib but had breakthrough episode this AM around 2:30, for which he took 50mg  metoprolol (usual dose 25mg  BID) He reported this eventually resolved by 8am. Notes initial rate of about 139, then 77, 69, 67 and then "back in rhythm".  Pt then noted around 2pm he had a return of palpitations, could tell he was out of rhythm again. Notes before my call that he took a 100mg  atenolol of his wife's along w evening meds. His BP when checked was 148/85. I advised no further meds, keep check on BP, note when/if arrythmia resolves.  He notes no SOB or fatigue w/ this episode or the one early morning.  He endorses compliance w/ meds as listed. On coumadin for anticoagulation.  He has been seen in past by Roderic Palau and I informed him I would reach out to her for recommendations/clinic appt. Called Butch Penny and she will reach out to him in AM. Pt agreeable to a clinic appt or other recommendations. He is aware we will follow up w/ support.

## 2016-03-09 NOTE — Telephone Encounter (Signed)
[name] [name] is calling  because about 3:30am he went into afib and about 8:30 he went back into rhythm and about 2:30pm he went into AFIB again and is still in AFIB , Right now . Please call

## 2016-03-10 NOTE — Telephone Encounter (Signed)
Called to check on patient - states feeling much better today. Went back in NSR around 530pm yesterday evening and no further episodes. Was mainly concerned since had ablation if he could still get afib. Educated that ablation is not 100% and he may have episodes of afib but he can call anytime has questions. States his blood pressure is "perfect" today but does notice his HR is 48 which is lower than normal for him. Encouraged him to avoid taking wifes medications (atenolol) since he is on metoprolol daily. .  Patient was appreciative of advice and will call if further episodes that require attention.

## 2016-03-12 ENCOUNTER — Ambulatory Visit (INDEPENDENT_AMBULATORY_CARE_PROVIDER_SITE_OTHER): Payer: Medicare Other | Admitting: Cardiology

## 2016-03-12 VITALS — BP 141/70 | HR 63 | Ht 71.0 in | Wt 215.8 lb

## 2016-03-12 DIAGNOSIS — R5383 Other fatigue: Secondary | ICD-10-CM | POA: Diagnosis not present

## 2016-03-12 DIAGNOSIS — Z79899 Other long term (current) drug therapy: Secondary | ICD-10-CM

## 2016-03-12 DIAGNOSIS — I481 Persistent atrial fibrillation: Secondary | ICD-10-CM | POA: Diagnosis not present

## 2016-03-12 DIAGNOSIS — I4819 Other persistent atrial fibrillation: Secondary | ICD-10-CM

## 2016-03-12 DIAGNOSIS — I6523 Occlusion and stenosis of bilateral carotid arteries: Secondary | ICD-10-CM

## 2016-03-12 NOTE — Progress Notes (Signed)
HPI The patient presents for evaluation of atrial fibrillation.  He is status post ablation. He's had more recurrence episodes of fibrillation recently.  He had a couple of episodes lasted for a few hours. He feels very fatigued after this happens. He hasn't gotten his strength back. He was active working in the arteries not been able to do that in the last week or so. He's not describing any chest pressure, neck or arm discomfort. She's not having any new shortness of breath, PND or orthopnea. He's had some mild lower extremity swelling.   Allergies  Allergen Reactions  . Sulfa Antibiotics Other (See Comments)    Severe peeling of the skin on the groin area  . Sulfamethoxazole     REACTION: rash    Current Outpatient Prescriptions  Medication Sig Dispense Refill  . acetaminophen (TYLENOL) 325 MG tablet Take 325 mg by mouth 2 (two) times daily.     . cetirizine (ZYRTEC) 10 MG tablet Take 10 mg by mouth every morning.     . latanoprost (XALATAN) 0.005 % ophthalmic solution Place 1 drop into both eyes at bedtime.     Marland Kitchen lisinopril (PRINIVIL,ZESTRIL) 10 MG tablet Take 10 mg by mouth at bedtime.     Marland Kitchen loratadine (CLARITIN) 10 MG tablet Take 10 mg by mouth at bedtime.     . metoprolol (LOPRESSOR) 50 MG tablet Take 0.5 tablets (25 mg total) by mouth 2 (two) times daily. 90 tablet 3  . mometasone (NASONEX) 50 MCG/ACT nasal spray Place 2 sprays into both nostrils daily as needed (allergies).     . Multiple Vitamins-Minerals (CENTRUM SILVER PO) Take 1 tablet by mouth every morning.     Marland Kitchen omeprazole (PRILOSEC) 20 MG capsule Take 20 mg by mouth 2 (two) times daily.     . potassium chloride (KLOR-CON) 10 MEQ CR tablet Take 10 mEq by mouth every morning.     . Psyllium (METAMUCIL PO) Take 30 mLs by mouth 2 (two) times daily.     . simvastatin (ZOCOR) 40 MG tablet Take 40 mg by mouth at bedtime.      . tamsulosin (FLOMAX) 0.4 MG CAPS Take 0.4 mg by mouth 2 (two) times daily.     Marland Kitchen warfarin  (COUMADIN) 5 MG tablet TAKE 1 TABLET BY MOUTH ONCE DAILY OR AS DIRECTED BY COUMADIN CLINIC 30 tablet 5   No current facility-administered medications for this visit.    Past Medical History  Diagnosis Date  . Allergic rhinitis   . Hyperlipidemia   . HTN (hypertension)   . Low back pain   . Paroxysmal atrial fibrillation (HCC)     chads2vasc score is at least 4  . GERD (gastroesophageal reflux disease)   . Glaucoma   . Hx of colonic polyps     tubular adenoma   . Nephrolithiasis   . Hemorrhoids     internal  . Pancolonic diverticulosis   . 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 Cataract     REMOVED  . Kidney stone   . Coronary artery disease   . Myocardial infarction [facility])     Past Surgical History  Procedure Laterality Date  . Cholecystectomy  02/1990    Dr. [name]  . Vasectomy    . Right inguinal hernia repair  04/1990    Dr. [name]  . Refractive surgery    . Ruptured blood vessel in the bladder    . Removed 2 stones from left  ureter    . Blood vessel repair in bladder    . Colonoscopy  06-29-12    per Dr. [name], adenomatous polyps, repeat in 3 yrs   . Tee without cardioversion N/A 11/11/2014    Procedure: TRANSESOPHAGEAL ECHOCARDIOGRAM (TEE);  Surgeon: [name], MD;  Location: [facility] ENDOSCOPY;  Service: Cardiovascular;  Laterality: N/A;  needs INR before case  . Atrial fibrillation ablation N/A 11/12/2014    Procedure: ATRIAL FIBRILLATION ABLATION;  Surgeon: [name], MD;  Location: [facility] CATH LAB;  Service: Cardiovascular;  Laterality: N/A;    ROS:  As stated in the HPI and negative for all other systems.   PHYSICAL EXAM There were no vitals taken for this visit. GENERAL:  Well appearing NECK:  No jugular venous distention, waveform within normal limits, carotid upstroke brisk and symmetric, no bruits, no thyromegaly LUNGS:  Clear to auscultation bilaterally BACK:  No CVA tenderness CHEST:  Unremarkable HEART:  PMI not displaced or sustained,S1 and S2  within normal limits, no S3, no clicks, no rubs, no murmurs, irregular ABD:  Flat, positive bowel sounds normal in frequency in pitch, no bruits, no rebound, no guarding, no midline pulsatile mass, no hepatomegaly, no splenomegaly EXT:  2 plus pulses throughout, no edema, no cyanosis no clubbing  EKG:  Sinus bradycardia, rate 63, axis within normal limits, intervals within normal limits, poor anterior R wave progression.  03/12/2016  ASSESSMENT AND PLAN   ATRIAL FIBRILLATION -  [name] has a CHA2DS2 - VASc score of 3 with a risk of stroke of 3.3%.  He will continue follow up with Dr. [name] and he will continue warfarin.   I will move up his appointment with Dr. [name] said he can discuss further therapies possibly  Tikosyn although he's reluctant to do this.   HYPERTENSION -  I reviewed dietary and his blood pressure is well-controlled. He'll continue the meds as listed.  FATIGUE -   I think it unlikely that this is an anginal equivalent but I would like to screen him.   I will bring the patient back for a POET (Plain Old Exercise Test). This will allow me to screen for obstructive coronary disease, risk stratify and very importantly provide a prescription for exercise.

## 2016-03-12 NOTE — Patient Instructions (Signed)
Medication Instructions:  Continue current medications  Labwork: CBC,TSH,BMP  Testing/Procedures: Your physician has requested that you have an exercise tolerance test. For further information please visit [email or web address]. Please also follow instruction sheet, as given.  Follow-Up: 3 Months  Any Other Special Instructions Will Be Listed Below (If Applicable). You have been referred to [name]   If you need a refill on your cardiac medications before your next appointment, please call your pharmacy.

## 2016-03-14 ENCOUNTER — Encounter: Payer: Self-pay | Admitting: Cardiology

## 2016-03-15 DIAGNOSIS — R5383 Other fatigue: Secondary | ICD-10-CM | POA: Diagnosis not present

## 2016-03-15 DIAGNOSIS — Z79899 Other long term (current) drug therapy: Secondary | ICD-10-CM | POA: Diagnosis not present

## 2016-03-16 LAB — CBC
HCT: 39.1 % (ref 38.5–50.0)
Hemoglobin: 12.8 g/dL — ABNORMAL LOW (ref 13.2–17.1)
MCH: 31 pg (ref 27.0–33.0)
MCHC: 32.7 g/dL (ref 32.0–36.0)
MCV: 94.7 fL (ref 80.0–100.0)
MPV: 10.8 fL (ref 7.5–12.5)
Platelets: 215 10*3/uL (ref 140–400)
RBC: 4.13 MIL/uL — AB (ref 4.20–5.80)
RDW: 14 % (ref 11.0–15.0)
WBC: 9.1 10*3/uL (ref 3.8–10.8)

## 2016-03-16 LAB — BASIC METABOLIC PANEL
BUN: 19 mg/dL (ref 7–25)
CO2: 30 mmol/L (ref 20–31)
Calcium: 9.2 mg/dL (ref 8.6–10.3)
Chloride: 102 mmol/L (ref 98–110)
Creat: 1.02 mg/dL (ref 0.70–1.11)
GLUCOSE: 89 mg/dL (ref 65–99)
Potassium: 4.3 mmol/L (ref 3.5–5.3)
Sodium: 140 mmol/L (ref 135–146)

## 2016-03-16 LAB — TSH: TSH: 1.17 m[IU]/L (ref 0.40–4.50)

## 2016-03-22 ENCOUNTER — Other Ambulatory Visit: Payer: Self-pay | Admitting: Family Medicine

## 2016-03-22 DIAGNOSIS — I6523 Occlusion and stenosis of bilateral carotid arteries: Secondary | ICD-10-CM

## 2016-03-29 ENCOUNTER — Inpatient Hospital Stay (HOSPITAL_COMMUNITY): Admission: RE | Admit: 2016-03-29 | Payer: Medicare Other | Source: Ambulatory Visit

## 2016-03-30 ENCOUNTER — Telehealth (HOSPITAL_COMMUNITY): Payer: Self-pay

## 2016-03-30 NOTE — Telephone Encounter (Signed)
Encounter complete. 

## 2016-04-01 ENCOUNTER — Encounter (HOSPITAL_COMMUNITY): Payer: Self-pay | Admitting: *Deleted

## 2016-04-01 ENCOUNTER — Ambulatory Visit (HOSPITAL_COMMUNITY)
Admission: RE | Admit: 2016-04-01 | Discharge: 2016-04-01 | Disposition: A | Discharge status: Home / Self Care | Payer: Medicare Other | Source: Ambulatory Visit | Attending: Cardiology | Admitting: Cardiology

## 2016-04-01 DIAGNOSIS — K219 Gastro-esophageal reflux disease without esophagitis: Secondary | ICD-10-CM | POA: Diagnosis not present

## 2016-04-01 DIAGNOSIS — I6523 Occlusion and stenosis of bilateral carotid arteries: Secondary | ICD-10-CM | POA: Insufficient documentation

## 2016-04-01 DIAGNOSIS — I2510 Atherosclerotic heart disease of native coronary artery without angina pectoris: Secondary | ICD-10-CM | POA: Diagnosis not present

## 2016-04-01 DIAGNOSIS — I10 Essential (primary) hypertension: Secondary | ICD-10-CM | POA: Insufficient documentation

## 2016-04-01 DIAGNOSIS — I481 Persistent atrial fibrillation: Secondary | ICD-10-CM

## 2016-04-01 DIAGNOSIS — R9439 Abnormal result of other cardiovascular function study: Secondary | ICD-10-CM | POA: Insufficient documentation

## 2016-04-01 DIAGNOSIS — E785 Hyperlipidemia, unspecified: Secondary | ICD-10-CM | POA: Diagnosis not present

## 2016-04-01 DIAGNOSIS — I4819 Other persistent atrial fibrillation: Secondary | ICD-10-CM

## 2016-04-01 LAB — EXERCISE TOLERANCE TEST
CHL CUP RESTING HR STRESS: 80 {beats}/min
CHL RATE OF PERCEIVED EXERTION: 16
CSEPED: 6 min
Estimated workload: 7.4 METS
Exercise duration (sec): 18 s
MPHR: 136 {beats}/min
Peak HR: 131 {beats}/min
Percent HR: 96 %

## 2016-04-01 NOTE — Progress Notes (Unsigned)
Patient ID: [name] [name], male   DOB: July 02, 1929, 82 y.o.   MRN: [id number] Patient had some EKG changes, reviewed by Dr. Percival Spanish, he ok'd to d/c pt home and stated he would call him.

## 2016-04-02 ENCOUNTER — Other Ambulatory Visit: Payer: Self-pay | Admitting: *Deleted

## 2016-04-02 ENCOUNTER — Telehealth: Payer: Self-pay | Admitting: *Deleted

## 2016-04-02 DIAGNOSIS — R9439 Abnormal result of other cardiovascular function study: Secondary | ICD-10-CM

## 2016-04-02 DIAGNOSIS — Z01818 Encounter for other preprocedural examination: Secondary | ICD-10-CM

## 2016-04-02 NOTE — Telephone Encounter (Signed)
Spoke with pt letting him know his Cath is schedule for Tuesday, May 30th @ 9:00am and he need to arrived at 6:30am, pt voice understanding also pt will have labs drawn at arrival on Tuesday.

## 2016-04-02 NOTE — Telephone Encounter (Signed)
-----   Message from [name], MD sent at 04/02/2016 11:11 AM EDT ----- Abnormal POET (Plain Old Exercise Treadmill).  Plan cath on Tuesday as an outpatient.  Please call with the arrangements.  I talked to the patient about the result.  He will not need an Xray and labs are up to date so these don't need to be repeated.

## 2016-04-06 ENCOUNTER — Inpatient Hospital Stay (HOSPITAL_COMMUNITY)
Admission: RE | Admit: 2016-04-06 | Discharge: 2016-04-07 | DRG: 247 | Disposition: A | Discharge status: Home / Self Care | Payer: Medicare Other | Source: Ambulatory Visit | Attending: Interventional Cardiology | Admitting: Interventional Cardiology

## 2016-04-06 ENCOUNTER — Encounter (HOSPITAL_COMMUNITY)
Admission: RE | Disposition: A | Discharge status: Home / Self Care | Payer: Self-pay | Source: Ambulatory Visit | Attending: Interventional Cardiology

## 2016-04-06 ENCOUNTER — Encounter (HOSPITAL_COMMUNITY): Payer: Self-pay | Admitting: General Practice

## 2016-04-06 DIAGNOSIS — I10 Essential (primary) hypertension: Secondary | ICD-10-CM | POA: Diagnosis not present

## 2016-04-06 DIAGNOSIS — I480 Paroxysmal atrial fibrillation: Secondary | ICD-10-CM | POA: Diagnosis present

## 2016-04-06 DIAGNOSIS — E785 Hyperlipidemia, unspecified: Secondary | ICD-10-CM | POA: Diagnosis not present

## 2016-04-06 DIAGNOSIS — I252 Old myocardial infarction: Secondary | ICD-10-CM

## 2016-04-06 DIAGNOSIS — Z85820 Personal history of malignant melanoma of skin: Secondary | ICD-10-CM

## 2016-04-06 DIAGNOSIS — Z955 Presence of coronary angioplasty implant and graft: Secondary | ICD-10-CM

## 2016-04-06 DIAGNOSIS — K219 Gastro-esophageal reflux disease without esophagitis: Secondary | ICD-10-CM | POA: Diagnosis present

## 2016-04-06 DIAGNOSIS — H409 Unspecified glaucoma: Secondary | ICD-10-CM | POA: Diagnosis present

## 2016-04-06 DIAGNOSIS — I25118 Atherosclerotic heart disease of native coronary artery with other forms of angina pectoris: Secondary | ICD-10-CM | POA: Diagnosis not present

## 2016-04-06 DIAGNOSIS — R9439 Abnormal result of other cardiovascular function study: Secondary | ICD-10-CM | POA: Diagnosis not present

## 2016-04-06 DIAGNOSIS — Z79899 Other long term (current) drug therapy: Secondary | ICD-10-CM | POA: Diagnosis not present

## 2016-04-06 DIAGNOSIS — Z9861 Coronary angioplasty status: Secondary | ICD-10-CM

## 2016-04-06 DIAGNOSIS — Z7901 Long term (current) use of anticoagulants: Secondary | ICD-10-CM

## 2016-04-06 DIAGNOSIS — I2510 Atherosclerotic heart disease of native coronary artery without angina pectoris: Secondary | ICD-10-CM | POA: Diagnosis not present

## 2016-04-06 DIAGNOSIS — Z01818 Encounter for other preprocedural examination: Secondary | ICD-10-CM

## 2016-04-06 HISTORY — DX: Malignant melanoma of other parts of face: C43.39

## 2016-04-06 HISTORY — PX: CORONARY ANGIOPLASTY WITH STENT PLACEMENT: SHX49

## 2016-04-06 HISTORY — PX: CARDIAC CATHETERIZATION: SHX172

## 2016-04-06 LAB — PROTIME-INR
INR: 1.31 (ref 0.00–1.49)
PROTHROMBIN TIME: 16.4 s — AB (ref 11.6–15.2)

## 2016-04-06 LAB — BASIC METABOLIC PANEL
ANION GAP: 9 (ref 5–15)
BUN: 17 mg/dL (ref 6–20)
CALCIUM: 9.4 mg/dL (ref 8.9–10.3)
CO2: 28 mmol/L (ref 22–32)
Chloride: 103 mmol/L (ref 101–111)
Creatinine, Ser: 1.1 mg/dL (ref 0.61–1.24)
GFR, EST NON AFRICAN AMERICAN: 60 mL/min — AB (ref >60)
GLUCOSE: 117 mg/dL — AB (ref 65–99)
POTASSIUM: 3.9 mmol/L (ref 3.5–5.1)
Sodium: 140 mmol/L (ref 135–145)

## 2016-04-06 LAB — CBC
HEMATOCRIT: 37.6 % — AB (ref 39.0–52.0)
HEMOGLOBIN: 12.1 g/dL — AB (ref 13.0–17.0)
MCH: 30.9 pg (ref 26.0–34.0)
MCHC: 32.2 g/dL (ref 30.0–36.0)
MCV: 96.2 fL (ref 78.0–100.0)
PLATELETS: 183 10*3/uL (ref 150–400)
RBC: 3.91 MIL/uL — AB (ref 4.22–5.81)
RDW: 13.7 % (ref 11.5–15.5)
WBC: 8.8 10*3/uL (ref 4.0–10.5)

## 2016-04-06 LAB — POCT ACTIVATED CLOTTING TIME: Activated Clotting Time: 472 seconds

## 2016-04-06 SURGERY — LEFT HEART CATH AND CORONARY ANGIOGRAPHY
Anesthesia: LOCAL

## 2016-04-06 MED ORDER — ONDANSETRON HCL 4 MG/2ML IJ SOLN: 4.0000 mg | Freq: Four times a day (QID) | INTRAMUSCULAR | Status: DC | PRN

## 2016-04-06 MED ORDER — AMIODARONE HCL IN DEXTROSE 360-4.14 MG/200ML-% IV SOLN
30.0000 mg/h | INTRAVENOUS | Status: DC
  Administered 2016-04-06: 17:00:00 30 mg/h via INTRAVENOUS
  Filled 2016-04-06 (×4): qty 200

## 2016-04-06 MED ORDER — CLOPIDOGREL BISULFATE 300 MG PO TABS
ORAL_TABLET | ORAL | Status: AC
Start: 2016-04-06 — End: 2016-04-06
  Filled 2016-04-06: qty 1

## 2016-04-06 MED ORDER — VERAPAMIL HCL 2.5 MG/ML IV SOLN
INTRAVENOUS | Status: AC
  Filled 2016-04-06: qty 2

## 2016-04-06 MED ORDER — ASPIRIN 81 MG PO CHEW
81.0000 mg | CHEWABLE_TABLET | Freq: Every day | ORAL | Status: DC
  Administered 2016-04-07: 81 mg via ORAL
  Filled 2016-04-06: qty 1

## 2016-04-06 MED ORDER — CLOPIDOGREL BISULFATE 75 MG PO TABS
75.0000 mg | ORAL_TABLET | Freq: Every day | ORAL | Status: DC
  Administered 2016-04-07: 75 mg via ORAL
  Filled 2016-04-06: qty 1

## 2016-04-06 MED ORDER — HEPARIN (PORCINE) IN NACL 2-0.9 UNIT/ML-% IJ SOLN
INTRAMUSCULAR | Status: AC
  Filled 2016-04-06: qty 1000

## 2016-04-06 MED ORDER — BIVALIRUDIN BOLUS VIA INFUSION - CUPID
INTRAVENOUS | Status: DC | PRN
  Administered 2016-04-06: 71.475 mg via INTRAVENOUS

## 2016-04-06 MED ORDER — SODIUM CHLORIDE 0.9% FLUSH
3.0000 mL | Freq: Two times a day (BID) | INTRAVENOUS | Status: DC
  Administered 2016-04-06: 16:00:00 3 mL via INTRAVENOUS

## 2016-04-06 MED ORDER — SODIUM CHLORIDE 0.9 % IV SOLN
INTRAVENOUS | Status: DC
  Administered 2016-04-06: 08:00:00 via INTRAVENOUS

## 2016-04-06 MED ORDER — HEPARIN SODIUM (PORCINE) 1000 UNIT/ML IJ SOLN
INTRAMUSCULAR | Status: DC | PRN
  Administered 2016-04-06: 5000 [IU] via INTRAVENOUS

## 2016-04-06 MED ORDER — IOPAMIDOL (ISOVUE-370) INJECTION 76%
INTRAVENOUS | Status: AC
  Filled 2016-04-06: qty 100

## 2016-04-06 MED ORDER — HEPARIN SODIUM (PORCINE) 1000 UNIT/ML IJ SOLN
INTRAMUSCULAR | Status: AC
  Filled 2016-04-06: qty 1

## 2016-04-06 MED ORDER — CLOPIDOGREL BISULFATE 300 MG PO TABS
ORAL_TABLET | ORAL | Status: DC | PRN
  Administered 2016-04-06: 600 mg via ORAL

## 2016-04-06 MED ORDER — MIDAZOLAM HCL 2 MG/2ML IJ SOLN
INTRAMUSCULAR | Status: DC | PRN
  Administered 2016-04-06 (×3): 1 mg via INTRAVENOUS

## 2016-04-06 MED ORDER — AMIODARONE HCL IN DEXTROSE 360-4.14 MG/200ML-% IV SOLN
60.0000 mg/h | INTRAVENOUS | Status: DC
  Administered 2016-04-06: 60 mg/h via INTRAVENOUS
  Filled 2016-04-06: qty 200

## 2016-04-06 MED ORDER — VERAPAMIL HCL 2.5 MG/ML IV SOLN
INTRAVENOUS | Status: DC | PRN
  Administered 2016-04-06 (×2): 1.2 mL via INTRA_ARTERIAL

## 2016-04-06 MED ORDER — SIMVASTATIN 20 MG PO TABS: 40.0000 mg | ORAL_TABLET | Freq: Every day | ORAL | Status: DC

## 2016-04-06 MED ORDER — POTASSIUM CHLORIDE CRYS ER 10 MEQ PO TBCR
10.0000 meq | EXTENDED_RELEASE_TABLET | Freq: Every day | ORAL | Status: DC
Start: 2016-04-07 — End: 2016-04-07
  Filled 2016-04-06: qty 1

## 2016-04-06 MED ORDER — METOPROLOL TARTRATE 25 MG PO TABS
25.0000 mg | ORAL_TABLET | Freq: Two times a day (BID) | ORAL | Status: DC
  Administered 2016-04-07: 25 mg via ORAL
  Filled 2016-04-06: qty 1

## 2016-04-06 MED ORDER — FLUTICASONE PROPIONATE 50 MCG/ACT NA SUSP
2.0000 | Freq: Every day | NASAL | Status: DC
  Filled 2016-04-06: qty 16

## 2016-04-06 MED ORDER — SODIUM CHLORIDE 0.9% FLUSH: 3.0000 mL | INTRAVENOUS | Status: DC | PRN

## 2016-04-06 MED ORDER — LIDOCAINE HCL (PF) 1 % IJ SOLN
INTRAMUSCULAR | Status: DC | PRN
  Administered 2016-04-06: 2 mL

## 2016-04-06 MED ORDER — LORATADINE 10 MG PO TABS
10.0000 mg | ORAL_TABLET | Freq: Every day | ORAL | Status: DC
  Filled 2016-04-06: qty 1

## 2016-04-06 MED ORDER — WARFARIN - PHARMACIST DOSING INPATIENT: Freq: Every day | Status: DC

## 2016-04-06 MED ORDER — SODIUM CHLORIDE 0.9 % WEIGHT BASED INFUSION
1.0000 mL/kg/h | INTRAVENOUS | Status: AC
  Administered 2016-04-06: 1 mL/kg/h via INTRAVENOUS

## 2016-04-06 MED ORDER — BIVALIRUDIN 250 MG IV SOLR
INTRAVENOUS | Status: AC
  Filled 2016-04-06: qty 250

## 2016-04-06 MED ORDER — FENTANYL CITRATE (PF) 100 MCG/2ML IJ SOLN
INTRAMUSCULAR | Status: DC | PRN
  Administered 2016-04-06: 50 ug via INTRAVENOUS

## 2016-04-06 MED ORDER — WARFARIN SODIUM 7.5 MG PO TABS
7.5000 mg | ORAL_TABLET | Freq: Once | ORAL | Status: AC
  Administered 2016-04-06: 7.5 mg via ORAL
  Filled 2016-04-06: qty 1

## 2016-04-06 MED ORDER — BIVALIRUDIN 250 MG IV SOLR
250.0000 mg | INTRAVENOUS | Status: DC | PRN
  Administered 2016-04-06: 1.75 mg/kg/h via INTRAVENOUS

## 2016-04-06 MED ORDER — SODIUM CHLORIDE 0.9 % IV SOLN: 250.0000 mL | INTRAVENOUS | Status: DC | PRN

## 2016-04-06 MED ORDER — OXYCODONE-ACETAMINOPHEN 5-325 MG PO TABS: 1.0000 | ORAL_TABLET | ORAL | Status: DC | PRN

## 2016-04-06 MED ORDER — ASPIRIN 81 MG PO CHEW
81.0000 mg | CHEWABLE_TABLET | ORAL | Status: AC
  Administered 2016-04-06: 81 mg via ORAL

## 2016-04-06 MED ORDER — SODIUM CHLORIDE 0.9 % IV SOLN
1.7500 mg/kg/h | INTRAVENOUS | Status: DC
  Filled 2016-04-06 (×15): qty 250

## 2016-04-06 MED ORDER — LIDOCAINE HCL (PF) 1 % IJ SOLN
INTRAMUSCULAR | Status: AC
  Filled 2016-04-06: qty 30

## 2016-04-06 MED ORDER — AMIODARONE LOAD VIA INFUSION
INTRAVENOUS | Status: DC | PRN
  Administered 2016-04-06 (×2): 150 mg via INTRAVENOUS

## 2016-04-06 MED ORDER — MIDAZOLAM HCL 2 MG/2ML IJ SOLN
INTRAMUSCULAR | Status: AC
  Filled 2016-04-06: qty 2

## 2016-04-06 MED ORDER — ACETAMINOPHEN 325 MG PO TABS: 650.0000 mg | ORAL_TABLET | ORAL | Status: DC | PRN

## 2016-04-06 MED ORDER — TAMSULOSIN HCL 0.4 MG PO CAPS
0.4000 mg | ORAL_CAPSULE | Freq: Two times a day (BID) | ORAL | Status: DC
  Administered 2016-04-07: 0.4 mg via ORAL
  Filled 2016-04-06: qty 1

## 2016-04-06 MED ORDER — LISINOPRIL 10 MG PO TABS: 10.0000 mg | ORAL_TABLET | Freq: Every day | ORAL | Status: DC

## 2016-04-06 MED ORDER — CLOPIDOGREL BISULFATE 300 MG PO TABS
ORAL_TABLET | ORAL | Status: AC
  Filled 2016-04-06: qty 1

## 2016-04-06 MED ORDER — ASPIRIN 81 MG PO CHEW
CHEWABLE_TABLET | ORAL | Status: AC
  Administered 2016-04-06: 81 mg via ORAL
  Filled 2016-04-06: qty 1

## 2016-04-06 MED ORDER — LATANOPROST 0.005 % OP SOLN
1.0000 [drp] | Freq: Every day | OPHTHALMIC | Status: DC
  Filled 2016-04-06: qty 2.5

## 2016-04-06 MED ORDER — AMIODARONE HCL IN DEXTROSE 360-4.14 MG/200ML-% IV SOLN
INTRAVENOUS | Status: AC
  Filled 2016-04-06: qty 200

## 2016-04-06 MED ORDER — PANTOPRAZOLE SODIUM 40 MG PO TBEC
40.0000 mg | DELAYED_RELEASE_TABLET | Freq: Every day | ORAL | Status: DC
  Administered 2016-04-07: 40 mg via ORAL
  Filled 2016-04-06: qty 1

## 2016-04-06 MED ORDER — AMIODARONE HCL 150 MG/3ML IV SOLN
INTRAVENOUS | Status: AC
Start: 2016-04-06 — End: 2016-04-06
  Filled 2016-04-06: qty 3

## 2016-04-06 MED ORDER — NITROGLYCERIN 1 MG/10 ML FOR IR/CATH LAB
INTRA_ARTERIAL | Status: DC | PRN
  Administered 2016-04-06: 200 ug via INTRA_ARTERIAL

## 2016-04-06 MED ORDER — HEPARIN (PORCINE) IN NACL 2-0.9 UNIT/ML-% IJ SOLN
INTRAMUSCULAR | Status: DC | PRN
  Administered 2016-04-06: 1000 mL

## 2016-04-06 MED ORDER — FENTANYL CITRATE (PF) 100 MCG/2ML IJ SOLN
INTRAMUSCULAR | Status: AC
  Filled 2016-04-06: qty 2

## 2016-04-06 MED ORDER — HEPARIN SODIUM (PORCINE) 5000 UNIT/ML IJ SOLN
5000.0000 [IU] | Freq: Three times a day (TID) | INTRAMUSCULAR | Status: DC
  Administered 2016-04-06: 5000 [IU] via SUBCUTANEOUS
  Filled 2016-04-06: qty 1

## 2016-04-06 MED ORDER — IOPAMIDOL (ISOVUE-370) INJECTION 76%
INTRAVENOUS | Status: DC | PRN
  Administered 2016-04-06: 150 mL via INTRA_ARTERIAL

## 2016-04-06 SURGICAL SUPPLY — 19 items
BALLN EMERGE MR 3.5X15 (BALLOONS) ×2
BALLN ~~LOC~~ EMERGE MR 4.5X12 (BALLOONS) ×2
BALLOON EMERGE MR 3.5X15 (BALLOONS) IMPLANT
BALLOON ~~LOC~~ EMERGE MR 4.5X12 (BALLOONS) IMPLANT
CATH INFINITI 5 FR JL3.5 (CATHETERS) ×1 IMPLANT
CATH INFINITI JR4 5F (CATHETERS) ×1 IMPLANT
CATH VISTA GUIDE 6FR XBLAD3.5 (CATHETERS) ×1 IMPLANT
DEVICE RAD COMP TR BAND LRG (VASCULAR PRODUCTS) ×2 IMPLANT
GLIDESHEATH SLEND A-KIT 6F 22G (SHEATH) ×2 IMPLANT
GUIDEWIRE ANGLED .035X150CM (WIRE) ×1 IMPLANT
KIT ENCORE 26 ADVANTAGE (KITS) ×1 IMPLANT
KIT HEART LEFT (KITS) ×2 IMPLANT
PACK CARDIAC CATHETERIZATION (CUSTOM PROCEDURE TRAY) ×2 IMPLANT
STENT PROMUS PREM MR 4.0X20 (Permanent Stent) ×1 IMPLANT
TRANSDUCER W/STOPCOCK (MISCELLANEOUS) ×2 IMPLANT
TUBING CIL FLEX 10 FLL-RA (TUBING) ×2 IMPLANT
WIRE ASAHI PROWATER 180CM (WIRE) ×1 IMPLANT
WIRE HI TORQ VERSACORE-J 145CM (WIRE) ×1 IMPLANT
WIRE SAFE-T 1.5MM-J .035X260CM (WIRE) ×2 IMPLANT

## 2016-04-06 NOTE — Progress Notes (Signed)
ANTICOAGULATION CONSULT NOTE - Initial Consult  Pharmacy Consult for Coumadin Indication: atrial fibrillation  Allergies  Allergen Reactions  . Sulfa Antibiotics Other (See Comments)    Severe peeling of the skin on the groin area  . Sulfamethoxazole     REACTION: rash    Patient Measurements: Height: 5\' 11"  (180.3 cm) Weight: 210 lb (95.255 kg) IBW/kg (Calculated) : 75.3  Vital Signs: Temp: 97.6 F (36.4 C) (05/30 1111) Temp Source: Oral (05/30 1111) BP: 128/74 mmHg (05/30 1115) Pulse Rate: 89 (05/30 1115)  Labs:  Recent Labs  04/06/16 0741  HGB 12.1*  HCT 37.6*  PLT 183  LABPROT 16.4*  INR 1.31  CREATININE 1.10    Estimated Creatinine Clearance: 58.9 mL/min (by C-G formula based on Cr of 1.1).   Medical History: Past Medical History  Diagnosis Date  . Allergic rhinitis   . Hyperlipidemia   . HTN (hypertension)   . Low back pain   . Paroxysmal atrial fibrillation (HCC)     chads2vasc score is at least 4  . GERD (gastroesophageal reflux disease)   . Glaucoma   . Hx of colonic polyps     tubular adenoma   . Nephrolithiasis   . Hemorrhoids     internal  . Pancolonic diverticulosis   . 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 Cataract     REMOVED  . Kidney stone   . Coronary artery disease   . Myocardial infarction [facility])     Medications:  Prescriptions prior to admission  Medication Sig Dispense Refill Last Dose  . acetaminophen (TYLENOL) 325 MG tablet Take 325 mg by mouth 2 (two) times daily.    04/06/2016 at 0330  . cetirizine (ZYRTEC) 10 MG tablet Take 10 mg by mouth every morning.    04/06/2016 at Unknown time  . latanoprost (XALATAN) 0.005 % ophthalmic solution Place 1 drop into both eyes at bedtime.    04/05/2016 at Unknown time  . lisinopril (PRINIVIL,ZESTRIL) 10 MG tablet Take 10 mg by mouth at bedtime.    04/05/2016 at Unknown time  . loratadine (CLARITIN) 10 MG tablet Take 10 mg by mouth at bedtime.    04/05/2016 at Unknown time  . metoprolol  (LOPRESSOR) 50 MG tablet Take 0.5 tablets (25 mg total) by mouth 2 (two) times daily. 90 tablet 3 04/06/2016 at 0330  . mometasone (NASONEX) 50 MCG/ACT nasal spray Place 2 sprays into both nostrils daily as needed (allergies).    04/05/2016 at Unknown time  . Multiple Vitamins-Minerals (CENTRUM SILVER PO) Take 1 tablet by mouth every morning.    04/06/2016 at Unknown time  . omeprazole (PRILOSEC) 20 MG capsule Take 20 mg by mouth 2 (two) times daily.    04/06/2016 at Unknown time  . potassium chloride (KLOR-CON) 10 MEQ CR tablet Take 10 mEq by mouth every morning.    04/06/2016 at Unknown time  . simvastatin (ZOCOR) 40 MG tablet Take 40 mg by mouth at bedtime.     04/05/2016 at Unknown time  . tamsulosin (FLOMAX) 0.4 MG CAPS Take 0.4 mg by mouth 2 (two) times daily.    04/06/2016 at Unknown time  . warfarin (COUMADIN) 5 MG tablet TAKE 1 TABLET BY MOUTH ONCE DAILY OR AS DIRECTED BY COUMADIN CLINIC 30 tablet 5 03/31/2016  . [DISCONTINUED] Psyllium (METAMUCIL PO) Take 30 mLs by mouth 2 (two) times daily.    04/06/2016 at Unknown time    Assessment: 82 yo M with abnormal stress test outpatient  and admitted for scheduled cardiac cath 5/30.  Pt on Coumadin PTA for hx of afib.  Last dose 5/24 in anticipation of procedure.  PTA dose = 5mg  daily. Managed by [employer]. Same regimen since Jan with therapeutic INRs.  Goal of Therapy:  INR 2-3 Monitor platelets by anticoagulation protocol: Yes   Plan:  Coumadin 7.5mg  PO x 1 tonight Daily INR  [employer], Pharm.D., BCPS Clinical Pharmacist Pager [phone] 04/06/2016 1:17 PM

## 2016-04-06 NOTE — H&P (View-Only) (Signed)
HPI The patient presents for evaluation of atrial fibrillation.  He is status post ablation. He's had more recurrence episodes of fibrillation recently.  He had a couple of episodes lasted for a few hours. He feels very fatigued after this happens. He hasn't gotten his strength back. He was active working in the arteries not been able to do that in the last week or so. He's not describing any chest pressure, neck or arm discomfort. She's not having any new shortness of breath, PND or orthopnea. He's had some mild lower extremity swelling.   Allergies  Allergen Reactions  . Sulfa Antibiotics Other (See Comments)    Severe peeling of the skin on the groin area  . Sulfamethoxazole     REACTION: rash    Current Outpatient Prescriptions  Medication Sig Dispense Refill  . acetaminophen (TYLENOL) 325 MG tablet Take 325 mg by mouth 2 (two) times daily.     . cetirizine (ZYRTEC) 10 MG tablet Take 10 mg by mouth every morning.     . latanoprost (XALATAN) 0.005 % ophthalmic solution Place 1 drop into both eyes at bedtime.     Marland Kitchen lisinopril (PRINIVIL,ZESTRIL) 10 MG tablet Take 10 mg by mouth at bedtime.     Marland Kitchen loratadine (CLARITIN) 10 MG tablet Take 10 mg by mouth at bedtime.     . metoprolol (LOPRESSOR) 50 MG tablet Take 0.5 tablets (25 mg total) by mouth 2 (two) times daily. 90 tablet 3  . mometasone (NASONEX) 50 MCG/ACT nasal spray Place 2 sprays into both nostrils daily as needed (allergies).     . Multiple Vitamins-Minerals (CENTRUM SILVER PO) Take 1 tablet by mouth every morning.     Marland Kitchen omeprazole (PRILOSEC) 20 MG capsule Take 20 mg by mouth 2 (two) times daily.     . potassium chloride (KLOR-CON) 10 MEQ CR tablet Take 10 mEq by mouth every morning.     . Psyllium (METAMUCIL PO) Take 30 mLs by mouth 2 (two) times daily.     . simvastatin (ZOCOR) 40 MG tablet Take 40 mg by mouth at bedtime.      . tamsulosin (FLOMAX) 0.4 MG CAPS Take 0.4 mg by mouth 2 (two) times daily.     Marland Kitchen warfarin  (COUMADIN) 5 MG tablet TAKE 1 TABLET BY MOUTH ONCE DAILY OR AS DIRECTED BY COUMADIN CLINIC 30 tablet 5   No current facility-administered medications for this visit.    Past Medical History  Diagnosis Date  . Allergic rhinitis   . Hyperlipidemia   . HTN (hypertension)   . Low back pain   . Paroxysmal atrial fibrillation (HCC)     chads2vasc score is at least 4  . GERD (gastroesophageal reflux disease)   . Glaucoma   . Hx of colonic polyps     tubular adenoma   . Nephrolithiasis   . Hemorrhoids     internal  . Pancolonic diverticulosis   . 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 Cataract     REMOVED  . Kidney stone   . Coronary artery disease   . Myocardial infarction [facility])     Past Surgical History  Procedure Laterality Date  . Cholecystectomy  02/1990    Dr. [name]  . Vasectomy    . Right inguinal hernia repair  04/1990    Dr. [name]  . Refractive surgery    . Ruptured blood vessel in the bladder    . Removed 2 stones from left  ureter    . Blood vessel repair in bladder    . Colonoscopy  06-29-12    per Dr. [name], adenomatous polyps, repeat in 3 yrs   . Tee without cardioversion N/A 11/11/2014    Procedure: TRANSESOPHAGEAL ECHOCARDIOGRAM (TEE);  Surgeon: [name], MD;  Location: [facility] ENDOSCOPY;  Service: Cardiovascular;  Laterality: N/A;  needs INR before case  . Atrial fibrillation ablation N/A 11/12/2014    Procedure: ATRIAL FIBRILLATION ABLATION;  Surgeon: [name], MD;  Location: [facility] CATH LAB;  Service: Cardiovascular;  Laterality: N/A;    ROS:  As stated in the HPI and negative for all other systems.   PHYSICAL EXAM There were no vitals taken for this visit. GENERAL:  Well appearing NECK:  No jugular venous distention, waveform within normal limits, carotid upstroke brisk and symmetric, no bruits, no thyromegaly LUNGS:  Clear to auscultation bilaterally BACK:  No CVA tenderness CHEST:  Unremarkable HEART:  PMI not displaced or sustained,S1 and S2  within normal limits, no S3, no clicks, no rubs, no murmurs, irregular ABD:  Flat, positive bowel sounds normal in frequency in pitch, no bruits, no rebound, no guarding, no midline pulsatile mass, no hepatomegaly, no splenomegaly EXT:  2 plus pulses throughout, no edema, no cyanosis no clubbing  EKG:  Sinus bradycardia, rate 63, axis within normal limits, intervals within normal limits, poor anterior R wave progression.  03/12/2016  ASSESSMENT AND PLAN   ATRIAL FIBRILLATION -  [name] [name] has a CHA2DS2 - VASc score of 3 with a risk of stroke of 3.3%.  He will continue follow up with Dr. [name] and he will continue warfarin.   I will move up his appointment with Dr. [name] said he can discuss further therapies possibly  Tikosyn although he's reluctant to do this.   HYPERTENSION -  I reviewed dietary and his blood pressure is well-controlled. He'll continue the meds as listed.  FATIGUE -   I think it unlikely that this is an anginal equivalent but I would like to screen him.   I will bring the patient back for a POET (Plain Old Exercise Test). This will allow me to screen for obstructive coronary disease, risk stratify and very importantly provide a prescription for exercise.

## 2016-04-06 NOTE — Care Management Note (Signed)
Case Management Note  Patient Details  Name: [name] [name] MRN: [id number] Date of Birth: 1929/06/13  Subjective/Objective:   Patient is from home with spouse, pta indep, he states he is a [place] patient as well. NCM will need his dc summary and avs to be faxed to pvp at [facility].  Patient states he does not need any HH services.                  Action/Plan:   Expected Discharge Date:                  Expected Discharge Plan:  Home/Self Care  In-House Referral:     Discharge planning Services  CM Consult  Post Acute Care Choice:    Choice offered to:     DME Arranged:    DME Agency:     HH Arranged:    HH Agency:     Status of Service:  In process, will continue to follow  Medicare Important Message Given:    Date Medicare IM Given:    Medicare IM give by:    Date Additional Medicare IM Given:    Additional Medicare Important Message give by:     If discussed at [place] of Stay Meetings, dates discussed:    Additional Comments:  Zenon Mayo, RN 04/06/2016, 4:56 PM

## 2016-04-06 NOTE — Progress Notes (Signed)
TR BAND REMOVAL  LOCATION:  right radial  DEFLATED PER PROTOCOL:  Yes.    TIME BAND OFF / DRESSING APPLIED:   1515  SITE UPON ARRIVAL:   Level 0  SITE AFTER BAND REMOVAL:  Level 0  CIRCULATION SENSATION AND MOVEMENT:  Within Normal Limits  Yes.    COMMENTS:    

## 2016-04-06 NOTE — Interval H&P Note (Signed)
Cath Lab Visit (complete for each Cath Lab visit)  Clinical Evaluation Leading to the Procedure:   ACS: No.  Non-ACS:    Anginal Classification: CCS III  Anti-ischemic medical therapy: Minimal Therapy (1 class of medications)  Non-Invasive Test Results: High-risk stress test findings: cardiac mortality >3%/year  Prior CABG: No previous CABG      History and Physical Interval Note:  04/06/2016 7:35 AM  [name] [name]  has presented today for surgery, with the diagnosis of cp  The various methods of treatment have been discussed with the patient and family. After consideration of risks, benefits and other options for treatment, the patient has consented to  Procedure(s): Left Heart Cath and Coronary Angiography (N/A) as a surgical intervention .  The patient's history has been reviewed, patient examined, no change in status, stable for surgery.  I have reviewed the patient's chart and labs.  Questions were answered to the patient's satisfaction.     [name] III

## 2016-04-07 ENCOUNTER — Encounter (HOSPITAL_COMMUNITY): Payer: Self-pay | Admitting: Physician Assistant

## 2016-04-07 DIAGNOSIS — Z9861 Coronary angioplasty status: Secondary | ICD-10-CM

## 2016-04-07 DIAGNOSIS — I480 Paroxysmal atrial fibrillation: Secondary | ICD-10-CM

## 2016-04-07 DIAGNOSIS — I10 Essential (primary) hypertension: Secondary | ICD-10-CM

## 2016-04-07 DIAGNOSIS — I2510 Atherosclerotic heart disease of native coronary artery without angina pectoris: Secondary | ICD-10-CM | POA: Diagnosis present

## 2016-04-07 DIAGNOSIS — E785 Hyperlipidemia, unspecified: Secondary | ICD-10-CM

## 2016-04-07 DIAGNOSIS — I25118 Atherosclerotic heart disease of native coronary artery with other forms of angina pectoris: Secondary | ICD-10-CM

## 2016-04-07 LAB — BASIC METABOLIC PANEL
Anion gap: 7 (ref 5–15)
BUN: 14 mg/dL (ref 6–20)
CHLORIDE: 103 mmol/L (ref 101–111)
CO2: 29 mmol/L (ref 22–32)
CREATININE: 1.16 mg/dL (ref 0.61–1.24)
Calcium: 8.6 mg/dL — ABNORMAL LOW (ref 8.9–10.3)
GFR, EST NON AFRICAN AMERICAN: 56 mL/min — AB (ref >60)
Glucose, Bld: 115 mg/dL — ABNORMAL HIGH (ref 65–99)
Potassium: 3.8 mmol/L (ref 3.5–5.1)
SODIUM: 139 mmol/L (ref 135–145)

## 2016-04-07 LAB — CBC
HCT: 36.1 % — ABNORMAL LOW (ref 39.0–52.0)
Hemoglobin: 11.5 g/dL — ABNORMAL LOW (ref 13.0–17.0)
MCH: 30.6 pg (ref 26.0–34.0)
MCHC: 31.9 g/dL (ref 30.0–36.0)
MCV: 96 fL (ref 78.0–100.0)
Platelets: 172 10*3/uL (ref 150–400)
RBC: 3.76 MIL/uL — AB (ref 4.22–5.81)
RDW: 13.7 % (ref 11.5–15.5)
WBC: 10.9 10*3/uL — AB (ref 4.0–10.5)

## 2016-04-07 LAB — PROTIME-INR
INR: 1.25 (ref 0.00–1.49)
Prothrombin Time: 15.8 seconds — ABNORMAL HIGH (ref 11.6–15.2)

## 2016-04-07 MED ORDER — PANTOPRAZOLE SODIUM 40 MG PO TBEC: 40.0000 mg | DELAYED_RELEASE_TABLET | Freq: Every day | ORAL | Status: DC

## 2016-04-07 MED ORDER — CLOPIDOGREL BISULFATE 75 MG PO TABS: 75.0000 mg | ORAL_TABLET | Freq: Every day | ORAL | Status: DC

## 2016-04-07 MED ORDER — NITROGLYCERIN 0.4 MG SL SUBL: 0.4000 mg | SUBLINGUAL_TABLET | SUBLINGUAL | Status: DC | PRN

## 2016-04-07 MED ORDER — ATORVASTATIN CALCIUM 80 MG PO TABS: 80.0000 mg | ORAL_TABLET | Freq: Every day | ORAL | Status: DC

## 2016-04-07 MED ORDER — ASPIRIN 81 MG PO CHEW: 81.0000 mg | CHEWABLE_TABLET | Freq: Every day | ORAL | Status: DC

## 2016-04-07 MED ORDER — ANGIOPLASTY BOOK
Freq: Once | Status: AC
  Administered 2016-04-07
  Filled 2016-04-07: qty 1

## 2016-04-07 MED FILL — Amiodarone HCl Inj 150 MG/3ML (50 MG/ML): INTRAVENOUS | Qty: 3 | Status: AC

## 2016-04-07 NOTE — Discharge Instructions (Signed)

## 2016-04-07 NOTE — Progress Notes (Deleted)
Patient Name: [name] [name] Date of Encounter: 04/07/2016     Active Problems:   CAD in native artery   Abnormal stress test    SUBJECTIVE  No complaints. Ready to go home.   CURRENT MEDS . aspirin  81 mg Oral Daily  . clopidogrel  75 mg Oral Q breakfast  . fluticasone  2 spray Each Nare Daily  . latanoprost  1 drop Both Eyes QHS  . lisinopril  10 mg Oral QHS  . loratadine  10 mg Oral Daily  . metoprolol  25 mg Oral BID  . pantoprazole  40 mg Oral Daily  . potassium chloride  10 mEq Oral Daily  . simvastatin  40 mg Oral QHS  . sodium chloride flush  3 mL Intravenous Q12H  . tamsulosin  0.4 mg Oral BID  . Warfarin - Pharmacist Dosing Inpatient   Does not apply q1800    OBJECTIVE  Filed Vitals:   04/06/16 1502 04/06/16 1530 04/06/16 1600 04/06/16 1955  BP: 123/73 115/49 105/49 125/45  Pulse: 113 58 57 53  Temp: 97.7 F (36.5 C)   98.1 F (36.7 C)  TempSrc: Oral   Oral  Resp: 18 16 16 14   Height:      Weight:      SpO2: 98% 97% 95% 98%    Intake/Output Summary (Last 24 hours) at 04/07/16 0622 Last data filed at 04/07/16 0300  Gross per 24 hour  Intake 987.12 ml  Output   1400 ml  Net -412.88 ml   Filed Weights   04/06/16 0630  Weight: 210 lb (95.255 kg)    PHYSICAL EXAM  General: Pleasant, NAD. Neuro: Alert and oriented X 3. Moves all extremities spontaneously. Psych: Normal affect. HEENT:  Normal  Neck: Supple without bruits or JVD. Lungs:  Resp regular and unlabored, CTA. Heart: RRR no s3, s4, or murmurs. Abdomen: Soft, non-tender, non-distended, BS + x 4.  Extremities: No clubbing, cyanosis or edema. DP/PT/Radials 2+ and equal bilaterally.  Accessory Clinical Findings  CBC  Recent Labs  04/06/16 0741 04/07/16 0405  WBC 8.8 10.9*  HGB 12.1* 11.5*  HCT 37.6* 36.1*  MCV 96.2 96.0  PLT 183 [id number]   Basic Metabolic Panel  Recent Labs  04/06/16 0741 04/07/16 0405  NA 140 139  K 3.9 3.8  CL 103 103  CO2 28 29  GLUCOSE 117* 115*    BUN 17 14  CREATININE 1.10 1.16  CALCIUM 9.4 8.6*    TELE  NSR with some sinus brady overnight  Radiology/Studies  04/06/16 Procedures    Coronary Stent Intervention   Left Heart Cath and Coronary Angiography    Conclusion    1. Prox RCA lesion, 40% stenosed. 2. Ost 1st Diag to 1st Diag lesion, 85% stenosed. 3. Ost Ramus to Ramus lesion, 75% stenosed. 4. Prox Cx to Mid Cx lesion, 65% stenosed. 5. Prox LAD-1 lesion, 45% stenosed. 6. Prox LAD-2 lesion, 90% stenosed. Post intervention, there is a 0% residual stenosis.   High-grade obstruction in the proximal LAD and first and second diagonals.  Moderate mid circumflex obstruction.  Mild right coronary obstruction.  Angioplasty and stent of the proximal LAD with reduction in 90% stenosis to 0% with TIMI grade 3 flow. Medical therapy for diagonal disease. Postdilatation balloon size 4.5 mm in diameter within a [employer] DES.  Normal left ventricular function with EF of 65%  RECOMMENDATIONS:  Aspirin, Plavix, and Coumadin 1 month then dropped aspirin.  Plavix and Coumadin for  at least 6 months.  IV amiodarone until conversion of atrial fibrillation.  Hopeful discharge in a.m.         ASSESSMENT AND PLAN  [name] [name] is a 82 y.o. male with a history of PAF s/p ablation (11/2014) on Coumadin, HTN and recent abnormal GXT who presented to [facility] on 04/06/16 for outpatient cath.   CAD: he underwent LHC yesterday which revealed 40% occl pRCA, 85% occl D1, 75% occl Ramus, 65% occl mLCx, 45% occl pLAD, 90% occl pLAD and nl LV function. He underwent successful PCI/DES to prox LAD. Medical therapy for diag disease. Plan is for triple therapy with ASA, plavix and Coumadin x1 month followed by plavix and coumadin for at least 6 months. Continue BB and statin. Will change zocor to atorva 80mg  daily. He gets his meds through the [place] so will give him a script for this.   PAF: he underwent afib ablation in 11/2014 with  very few recurrences. He did had a small episode earlier this month. He went into afib during heart cath yesterday and was started on IV amiodarone and has now converted to NSR. Will stop IV amiodarone at discharge. CHADSVASC score of at least 4 (HTN, age, CAD). Coumadin has been restarted after cath. Will arranged for outpatient coumadin clinic appointment on Friday since he is planning to go to the beach on Sat.  HTN: BP well controlled currently.   Signed, Angelena Form PA-C  Pager [phone]

## 2016-04-07 NOTE — Discharge Summary (Signed)
Discharge Summary    Patient ID: [name] [name],  MRN: [id number], DOB/AGE: 03/26/1929 82 y.o.  Admit date: 04/06/2016 Discharge date: 04/07/2016  Primary Care Provider: [name] A Primary Cardiologist: Dr. Percival Spanish / Dr. [name] (EP)   Discharge Diagnoses    Principal Problem:   Abnormal stress test Active Problems:   Hyperlipidemia   Essential hypertension   Paroxysmal atrial fibrillation (HCC)   Coronary artery disease   Allergies Allergies  Allergen Reactions  . Sulfa Antibiotics Other (See Comments)    Severe peeling of the skin on the groin area  . Sulfamethoxazole     REACTION: rash     History of Present Illness     [name] [name] is a 82 y.o. male with a history of PAF s/p ablation (11/2014) on Coumadin, HTN, HLD and recent abnormal GXT who presented to [facility] on 04/06/16 for outpatient cath.   He was seen by Dr. Percival Spanish on 03/12/16 for cardiology follow up. He noted worsening fatigue and he was set up for GXT. He underwent exercise stress test on 04/01/16 which showed 2-3 mm of ST depression in inferior leads suggestive of ischemia. He was set up for heart cath on 04/06/16.   Hospital Course     Consultants: none  CAD/abnormal stress test: he underwent LHC on 04/06/16 which revealed 40% occl pRCA, 85% occl D1, 75% occl Ramus, 65% occl mLCx, 45% occl pLAD, 90% occl pLAD and nl LV function. He underwent successful PCI/DES to prox LAD. Medical therapy for diag disease. Plan is for triple therapy with ASA, plavix and Coumadin x1 month followed by plavix and coumadin for at least 6 months. Continue BB and statin. Will change zocor to atorva 80mg  daily. He gets his meds through the [place] so will give him a script for this.   PAF: he underwent afib ablation in 11/2014 with very few recurrences. He did had a small episode earlier this month. He went into afib during heart cath yesterday and was started on IV amiodarone and has now converted to NSR. Will stop IV amiodarone at  discharge. CHADSVASC score of at least 4 (HTN, age, CAD). Coumadin has been restarted after cath. Will arranged for outpatient coumadin clinic appointment on Friday since he is planning to go to the beach on Sat.   HTN: BP well controlled currently.   HLD: as above, change to atorvastatin 80mg  daily. He will finish Rx of Zocor  GERD: will switch to protonix in the setting of plavix use due to theoretical risk of interaction  The patient has had an uncomplicated hospital course and is recovering well. The radial catheter site is stable. He has been seen by Dr. Sallyanne Kuster today and deemed ready for discharge home. All follow-up appointments have been scheduled. Discharge medications are listed below.  _____________  Discharge Vitals  Blood pressure 153/62, pulse 59, temperature 97.7 F (36.5 C), temperature source Oral, resp. rate 18, height 5\' 11"  (1.803 m), weight 209 lb 7 oz (95 kg), SpO2 98 %.  Filed Weights   04/06/16 0630 04/07/16 0600  Weight: 210 lb (95.255 kg) 209 lb 7 oz (95 kg)   General: Pleasant, NAD. Neuro: Alert and oriented X 3. Moves all extremities spontaneously. Psych: Normal affect. HEENT: Normal Neck: Supple without bruits or JVD. Lungs: Resp regular and unlabored, CTA. Heart: RRR no s3, s4, or murmurs. Abdomen: Soft, non-tender, non-distended, BS + x 4.  Extremities: No clubbing, cyanosis or edema. DP/PT/Radials 2+ and equal bilaterally.  Labs &  Radiologic Studies     CBC  Recent Labs  04/06/16 0741 04/07/16 0405  WBC 8.8 10.9*  HGB 12.1* 11.5*  HCT 37.6* 36.1*  MCV 96.2 96.0  PLT 183 [id number]   Basic Metabolic Panel  Recent Labs  04/06/16 0741 04/07/16 0405  NA 140 139  K 3.9 3.8  CL 103 103  CO2 28 29  GLUCOSE 117* 115*  BUN 17 14  CREATININE 1.10 1.16  CALCIUM 9.4 8.6*     Diagnostic Studies/Procedures    04/06/16 Procedures    Coronary Stent Intervention   Left Heart Cath and Coronary Angiography    Conclusion      1. Prox RCA lesion, 40% stenosed. 2. Ost 1st Diag to 1st Diag lesion, 85% stenosed. 3. Ost Ramus to Ramus lesion, 75% stenosed. 4. Prox Cx to Mid Cx lesion, 65% stenosed. 5. Prox LAD-1 lesion, 45% stenosed. 6. Prox LAD-2 lesion, 90% stenosed. Post intervention, there is a 0% residual stenosis.   High-grade obstruction in the proximal LAD and first and second diagonals.  Moderate mid circumflex obstruction.  Mild right coronary obstruction.  Angioplasty and stent of the proximal LAD with reduction in 90% stenosis to 0% with TIMI grade 3 flow. Medical therapy for diagonal disease. Postdilatation balloon size 4.5 mm in diameter within a [employer] DES.  Normal left ventricular function with EF of 65%  RECOMMENDATIONS:  Aspirin, Plavix, and Coumadin 1 month then dropped aspirin.  Plavix and Coumadin for at least 6 months.  IV amiodarone until conversion of atrial fibrillation.  Hopeful discharge in a.m.           _____________    Disposition   Pt is being discharged home today in good condition.  Follow-up Plans & Appointments    Follow-up Information    Follow up with Angelena Form, PA-C On 04/19/2016.   Specialties:  Cardiology, Radiology   Why:  @ 3:30pm   Contact information:   [place] [address] [phone]       Follow up with [facility] On 04/09/2016.   Why:  @ 12:30pm for coumadin clinic appt   Contact information:   [place] [phone]       Discharge Medications   Current Discharge Medication List    START taking these medications   Details  aspirin 81 MG chewable tablet Chew 1 tablet (81 mg total) by mouth daily.    atorvastatin (LIPITOR) 80 MG tablet Take 1 tablet (80 mg total) by mouth daily. Qty: 90 tablet, Refills: 6    clopidogrel (PLAVIX) 75 MG tablet Take 1 tablet (75 mg total)  by mouth daily with breakfast. Qty: 30 tablet, Refills: 12    nitroGLYCERIN (NITROSTAT) 0.4 MG SL tablet Place 1 tablet (0.4 mg total) under the tongue every 5 (five) minutes as needed for chest pain. Qty: 30 tablet, Refills: 12    pantoprazole (PROTONIX) 40 MG tablet Take 1 tablet (40 mg total) by mouth daily. Qty: 30 tablet, Refills: 12      CONTINUE these medications which have NOT CHANGED   Details  acetaminophen (TYLENOL) 325 MG tablet Take 325 mg by mouth 2 (two) times daily.     cetirizine (ZYRTEC) 10 MG tablet Take 10 mg by mouth every morning.     latanoprost (XALATAN) 0.005 % ophthalmic solution Place 1 drop into both eyes at bedtime.     lisinopril (PRINIVIL,ZESTRIL) 10 MG  tablet Take 10 mg by mouth at bedtime.     loratadine (CLARITIN) 10 MG tablet Take 10 mg by mouth at bedtime.     metoprolol (LOPRESSOR) 50 MG tablet Take 0.5 tablets (25 mg total) by mouth 2 (two) times daily. Qty: 90 tablet, Refills: 3    mometasone (NASONEX) 50 MCG/ACT nasal spray Place 2 sprays into both nostrils daily as needed (allergies).     Multiple Vitamins-Minerals (CENTRUM SILVER PO) Take 1 tablet by mouth every morning.     potassium chloride (KLOR-CON) 10 MEQ CR tablet Take 10 mEq by mouth every morning.     tamsulosin (FLOMAX) 0.4 MG CAPS Take 0.4 mg by mouth 2 (two) times daily.     warfarin (COUMADIN) 5 MG tablet TAKE 1 TABLET BY MOUTH ONCE DAILY OR AS DIRECTED BY COUMADIN CLINIC Qty: 30 tablet, Refills: 5      STOP taking these medications     omeprazole (PRILOSEC) 20 MG capsule      simvastatin (ZOCOR) 40 MG tablet            Outstanding Labs/Studies  NONE  Duration of Discharge Encounter   Greater than 30 minutes including physician time.  Signed, Angelena Form PA-C 04/07/2016, 9:10 AM I have seen and examined the patient along with Angelena Form PA-C.  I have reviewed the chart, notes and new data.  I agree with PA's note.  Key new complaints: feels  well, no complaints at cath access site Key examination changes: no bleeding, no arrhythmia, no overt hypervolemia   PLAN: Will need "triple therapy" for 1 month, then stop ASA. Balance of bleeding versus clotting risk discussed. Switching to high potency statin. PPI changed due to clopidogrel interaction. Follow up arranged.  Sanda Klein, MD, [place] [phone] 04/07/2016, 9:54 AM

## 2016-04-07 NOTE — Progress Notes (Signed)
CARDIAC REHAB PHASE I   PRE:  Rate/Rhythm: 57 SB    BP: sitting 158/73    SaO2:   MODE:  Ambulation: 400 ft   POST:  Rate/Rhythm: 71 SR    BP: sitting 181/70     SaO2:   Tolerated well, no afib noted. Ed completed with pt and son. Voiced understanding and I will send referral to [place]. Pt understands the importance of Plavix/ASA. [place], ACSM 04/07/2016 9:28 AM

## 2016-04-09 ENCOUNTER — Ambulatory Visit (INDEPENDENT_AMBULATORY_CARE_PROVIDER_SITE_OTHER): Payer: Medicare Other | Admitting: Pharmacist

## 2016-04-09 DIAGNOSIS — Z5181 Encounter for therapeutic drug level monitoring: Secondary | ICD-10-CM | POA: Diagnosis not present

## 2016-04-09 DIAGNOSIS — Z7901 Long term (current) use of anticoagulants: Secondary | ICD-10-CM

## 2016-04-09 DIAGNOSIS — I4891 Unspecified atrial fibrillation: Secondary | ICD-10-CM | POA: Diagnosis not present

## 2016-04-09 LAB — POCT INR: INR: 1.5

## 2016-04-18 NOTE — Progress Notes (Signed)
Cardiology Office Note    Date:  04/19/2016   ID:  RA KNABE, DOB [birth year], MRN [id number]  PCP:  [name], MD  Cardiologist: Dr. Percival Spanish / Dr. [name] (EP)   [place] hospital follow up  History of Present Illness:  [name] [name] is a 82 y.o. male with a history of PAF s/p ablation (11/2014) on Coumadin, HTN, HLD and recently diagnosed CAD s/p DES to LAD (03/2016) who presents to clinic for post hospital follow up.   He was seen by Dr. Percival Spanish on 03/12/16 for cardiology follow up. He noted worsening fatigue and he was set up for GXT. He underwent exercise stress test on 04/01/16 which showed 2-3 mm of ST depression in inferior leads suggestive of ischemia. He was set up for heart cath on 04/06/16. He underwent LHC which revealed 40% occl pRCA, 85% occl D1, 75% occl Ramus, 65% occl mLCx, 45% occl pLAD, 90% occl pLAD and nl LV function. He underwent successful PCI/DES to prox LAD. Medical therapy for diag disease. Plan is for triple therapy with ASA, plavix and Coumadin x1 month followed by plavix and coumadin for at least 6 months. Continue BB and statin. Zocor was changed to atorva 80mg  daily. He underwent afib ablation in 11/2014 with very few recurrences. However, he went into afib during heart cath recently and was started on IV amiodarone and converted to NSR. Amio was discontinued at discharge. Coumadin has been restarted after cath.   Today he presents to clinic for follow up. He went to the beach this past week and had a wonderful time. He doesn't feel like he has been slowed down at all. He feels less fatigued and is less SOB when walking up stairs. No LE edema, orthopnea or PND. No palpitations. No dizziness or syncope. No blood in his stool or urine.     Past Medical History  Diagnosis Date  . Hyperlipidemia   . HTN (hypertension)   . Paroxysmal atrial fibrillation (HCC)     a. s/p ablation 12/2014: on coumadin  . GERD (gastroesophageal reflux disease)   . Glaucoma   . Hx  of colonic polyps     tubular adenoma   . Nephrolithiasis   . Hemorrhoids     internal  . Pancolonic diverticulosis   . Kidney stone   . Coronary artery disease     a. LHC on 04/06/16 which revealed 40% occl pRCA, 85% occl D1, 75% occl Ramus, 65% occl mLCx, 45% occl pLAD, 90% occl pLAD and nl LV function. s/p PCI/DES to prox LAD. Medical therapy for diag disease.    . Melanoma of cheek [facility])     Past Surgical History  Procedure Laterality Date  . Vasectomy    . Inguinal hernia repair Right 04/1990    Dr. [name]  . Cataract extraction w/ intraocular lens  implant, bilateral Bilateral   . Bladder surgery      ruptured blood vessel in the bladder 3 weeks S/P ureter stones removed  . Cystoscopy/retrograde/ureteroscopy/stone extraction with basket Left     removed 2 stones from left ureter  . Coronary angioplasty with stent placement  04/06/2016    "1 stent"  . Colonoscopy  06-29-12    per Dr. [name], adenomatous polyps, repeat in 3 yrs   . Tee without cardioversion N/A 11/11/2014    Procedure: TRANSESOPHAGEAL ECHOCARDIOGRAM (TEE);  Surgeon: [name], MD;  Location: [facility] ENDOSCOPY;  Service: Cardiovascular;  Laterality: N/A;  needs INR before case  .  Atrial fibrillation ablation N/A 11/12/2014    Procedure: ATRIAL FIBRILLATION ABLATION;  Surgeon:  Grayer, MD;  Location: [facility] CATH LAB;  Service: Cardiovascular;  Laterality: N/A;  . Laparoscopic cholecystectomy  02/1990    Dr. [name]  . Melanoma excision Right     "@ cheek near ear"  . Cardiac catheterization N/A 04/06/2016    Procedure: Left Heart Cath and Coronary Angiography;  Surgeon: [name], MD;  Location: [facility] CV LAB;  Service: Cardiovascular;  Laterality: N/A;  . Cardiac catheterization N/A 04/06/2016    Procedure: Coronary Stent Intervention;  Surgeon: [name], MD;  Location: [facility] CV LAB;  Service: Cardiovascular;  Laterality: N/A;    Current Medications: Outpatient Prescriptions Prior to Visit    Medication Sig Dispense Refill  . acetaminophen (TYLENOL) 325 MG tablet Take 325 mg by mouth 2 (two) times daily.     . cetirizine (ZYRTEC) 10 MG tablet Take 10 mg by mouth every morning.     . latanoprost (XALATAN) 0.005 % ophthalmic solution Place 1 drop into both eyes at bedtime.     Marland Kitchen lisinopril (PRINIVIL,ZESTRIL) 10 MG tablet Take 10 mg by mouth at bedtime.     Marland Kitchen loratadine (CLARITIN) 10 MG tablet Take 10 mg by mouth at bedtime.     . metoprolol (LOPRESSOR) 50 MG tablet Take 0.5 tablets (25 mg total) by mouth 2 (two) times daily. 90 tablet 3  . mometasone (NASONEX) 50 MCG/ACT nasal spray Place 2 sprays into both nostrils daily as needed (allergies).     . Multiple Vitamins-Minerals (CENTRUM SILVER PO) Take 1 tablet by mouth every morning.     . nitroGLYCERIN (NITROSTAT) 0.4 MG SL tablet Place 1 tablet (0.4 mg total) under the tongue every 5 (five) minutes as needed for chest pain. 30 tablet 12  . potassium chloride (KLOR-CON) 10 MEQ CR tablet Take 10 mEq by mouth every morning.     . tamsulosin (FLOMAX) 0.4 MG CAPS Take 0.4 mg by mouth 2 (two) times daily.     Marland Kitchen warfarin (COUMADIN) 5 MG tablet TAKE 1 TABLET BY MOUTH ONCE DAILY OR AS DIRECTED BY COUMADIN CLINIC 30 tablet 5  . aspirin 81 MG chewable tablet Chew 1 tablet (81 mg total) by mouth daily.    . clopidogrel (PLAVIX) 75 MG tablet Take 1 tablet (75 mg total) by mouth daily with breakfast. 30 tablet 12  . pantoprazole (PROTONIX) 40 MG tablet Take 1 tablet (40 mg total) by mouth daily. 30 tablet 12  . atorvastatin (LIPITOR) 80 MG tablet Take 1 tablet (80 mg total) by mouth daily. 90 tablet 6   No facility-administered medications prior to visit.     Allergies:   Sulfa antibiotics and Sulfamethoxazole   Social History   Social History  . Marital Status: Married    Spouse Name: N/A  . Number of Children: 2  . Years of Education: N/A   Occupational History  . Retired    Social History Main Topics  . Smoking status: Never  Smoker   . Smokeless tobacco: Never Used  . Alcohol Use: No  . Drug Use: No  . Sexual Activity: Not Currently   Other Topics Concern  . None   Social History Narrative     Family History:  The patient's family history includes Heart attack (age of onset: 33) in his father; Heart disease in his father; Ovarian cancer in his mother.   ROS:   Please see the history of present  illness.    ROS All other systems reviewed and are negative.   PHYSICAL EXAM:   VS:  BP 140/68 mmHg  Pulse 65  Ht 5\' 11"  (1.803 m)  Wt 213 lb 6.4 oz (96.798 kg)  BMI 29.78 kg/m2  SpO2 97%   GEN: Well nourished, well developed, in no acute distress HEENT: normal Neck: no JVD, carotid bruits, or masses Cardiac: RRR; no murmurs, rubs, or gallops,no edema  Respiratory:  clear to auscultation bilaterally, normal work of breathing GI: soft, nontender, nondistended, + BS MS: no deformity or atrophy Skin: warm and dry, no rash Neuro:  Alert and Oriented x 3, Strength and sensation are intact Psych: euthymic mood, full affect  Wt Readings from Last 3 Encounters:  04/19/16 213 lb 6.4 oz (96.798 kg)  04/07/16 209 lb 7 oz (95 kg)  03/12/16 215 lb 12.8 oz (97.886 kg)      Studies/Labs Reviewed:   EKG:  EKG is ordered today.  The ekg ordered today demonstrates NSR HR 65  Recent Labs: 08/25/2015: ALT 22 03/15/2016: TSH 1.17 04/07/2016: BUN 14; Creatinine, Ser 1.16; Hemoglobin 11.5*; Platelets 172; Potassium 3.8; Sodium 139   Lipid Panel    Component Value Date/Time   CHOL 171 08/25/2015 0929   TRIG 179.0* 08/25/2015 0929   HDL 44.10 08/25/2015 0929   CHOLHDL 4 08/25/2015 0929   VLDL 35.8 08/25/2015 0929   LDLCALC 91 08/25/2015 0929    Additional studies/ records that were reviewed today include:   04/06/16 Procedures    Coronary Stent Intervention   Left Heart Cath and Coronary Angiography    Conclusion    1. Prox RCA lesion, 40% stenosed. 2. Ost 1st Diag to 1st Diag  lesion, 85% stenosed. 3. Ost Ramus to Ramus lesion, 75% stenosed. 4. Prox Cx to Mid Cx lesion, 65% stenosed. 5. Prox LAD-1 lesion, 45% stenosed. 6. Prox LAD-2 lesion, 90% stenosed. Post intervention, there is a 0% residual stenosis.   High-grade obstruction in the proximal LAD and first and second diagonals.  Moderate mid circumflex obstruction.  Mild right coronary obstruction.  Angioplasty and stent of the proximal LAD with reduction in 90% stenosis to 0% with TIMI grade 3 flow. Medical therapy for diagonal disease. Postdilatation balloon size 4.5 mm in diameter within a [employer] DES.  Normal left ventricular function with EF of 65%  RECOMMENDATIONS:  Aspirin, Plavix, and Coumadin 1 month then dropped aspirin.  Plavix and Coumadin for at least 6 months.  IV amiodarone until conversion of atrial fibrillation.  Hopeful discharge in a.m.                 ASSESSMENT & PLAN:    CAD/abnormal stress test: he underwent LHC on 04/06/16 which revealed 40% occl pRCA, 85% occl D1, 75% occl Ramus, 65% occl mLCx, 45% occl pLAD, 90% occl pLAD and nl LV function. He underwent successful PCI/DES to prox LAD. Medical therapy for diag disease. Plan is for triple therapy with ASA, plavix and Coumadin x1 month followed by plavix and coumadin for at least 6 months. Continue BB and statin. He will drop ASA on 05/07/16.   PAF: he underwent afib ablation in 11/2014 with very few recurrences. However, he went into afib during heart cath recently and was started on IV amiodarone and converted to NSR. Amios was discontinued at discharge. CHADSVASC score of at least 4 (HTN, age, CAD). Coumadin has been restarted after cath. Maintaining NSR today.  HTN: BP well controlled currently.   HLD: He  will start atorvastatin 80mg  daily after he finishes his Rx of Zocor  GERD: continue protonix ( omeprazole stopped due to theoretical risk of interaction with plavix.)   Medication  Adjustments/Labs and Tests Ordered: Current medicines are reviewed at length with the patient today.  Concerns regarding medicines are outlined above.  Medication changes, Labs and Tests ordered today are listed in the Patient Instructions below. Patient Instructions  Your physician recommends that you continue on your current medications as directed. Please refer to the Current Medication list given to you today.  Your physician recommends that you schedule a follow-up appointment in: 3 months with Dr. Percival Spanish.  On 05/07/16 stop your aspirin.     Signed, Angelena Form, PA-C  04/19/2016 3:57 PM    [facility] [place], [place],   [address] Phone: [phone]; Fax: [phone]

## 2016-04-19 ENCOUNTER — Encounter: Payer: Self-pay | Admitting: Physician Assistant

## 2016-04-19 ENCOUNTER — Ambulatory Visit (INDEPENDENT_AMBULATORY_CARE_PROVIDER_SITE_OTHER): Payer: Medicare Other | Admitting: Physician Assistant

## 2016-04-19 ENCOUNTER — Ambulatory Visit (INDEPENDENT_AMBULATORY_CARE_PROVIDER_SITE_OTHER): Payer: Medicare Other

## 2016-04-19 VITALS — BP 140/68 | HR 65 | Ht 71.0 in | Wt 213.4 lb

## 2016-04-19 DIAGNOSIS — I10 Essential (primary) hypertension: Secondary | ICD-10-CM

## 2016-04-19 DIAGNOSIS — I480 Paroxysmal atrial fibrillation: Secondary | ICD-10-CM

## 2016-04-19 DIAGNOSIS — E785 Hyperlipidemia, unspecified: Secondary | ICD-10-CM | POA: Diagnosis not present

## 2016-04-19 DIAGNOSIS — I2510 Atherosclerotic heart disease of native coronary artery without angina pectoris: Secondary | ICD-10-CM

## 2016-04-19 DIAGNOSIS — Z7901 Long term (current) use of anticoagulants: Secondary | ICD-10-CM | POA: Diagnosis not present

## 2016-04-19 DIAGNOSIS — I4891 Unspecified atrial fibrillation: Secondary | ICD-10-CM | POA: Diagnosis not present

## 2016-04-19 DIAGNOSIS — Z5181 Encounter for therapeutic drug level monitoring: Secondary | ICD-10-CM

## 2016-04-19 LAB — POCT INR: INR: 2.7

## 2016-04-19 MED ORDER — PANTOPRAZOLE SODIUM 40 MG PO TBEC: 40.0000 mg | DELAYED_RELEASE_TABLET | Freq: Every day | ORAL | Status: AC

## 2016-04-19 MED ORDER — ASPIRIN 81 MG PO CHEW
81.0000 mg | CHEWABLE_TABLET | Freq: Every day | ORAL | Status: AC
Start: 2016-04-19 — End: 2016-05-07

## 2016-04-19 MED ORDER — CLOPIDOGREL BISULFATE 75 MG PO TABS: 75.0000 mg | ORAL_TABLET | Freq: Every day | ORAL | Status: DC

## 2016-04-19 MED ORDER — ATORVASTATIN CALCIUM 80 MG PO TABS: 80.0000 mg | ORAL_TABLET | Freq: Every day | ORAL | Status: AC

## 2016-04-19 NOTE — Patient Instructions (Signed)
Your physician recommends that you continue on your current medications as directed. Please refer to the Current Medication list given to you today.  Your physician recommends that you schedule a follow-up appointment in: 3 months with Dr. Percival Spanish.  On 05/07/16 stop your aspirin.

## 2016-04-23 ENCOUNTER — Encounter: Payer: Self-pay | Admitting: Internal Medicine

## 2016-04-23 ENCOUNTER — Ambulatory Visit (INDEPENDENT_AMBULATORY_CARE_PROVIDER_SITE_OTHER): Payer: Medicare Other | Admitting: Internal Medicine

## 2016-04-23 VITALS — BP 140/68 | HR 53 | Ht 71.0 in | Wt 212.8 lb

## 2016-04-23 DIAGNOSIS — I25119 Atherosclerotic heart disease of native coronary artery with unspecified angina pectoris: Secondary | ICD-10-CM

## 2016-04-23 DIAGNOSIS — I480 Paroxysmal atrial fibrillation: Secondary | ICD-10-CM

## 2016-04-23 DIAGNOSIS — I6523 Occlusion and stenosis of bilateral carotid arteries: Secondary | ICD-10-CM

## 2016-04-23 NOTE — Progress Notes (Signed)
Electrophysiology Office Note   Date:  04/23/2016   ID:  [name] [name], DOB 1929-08-22, MRN [id number]  PCP:  [name], MD  Cardiologist:  Dr Percival Spanish Primary Electrophysiologist: [name], MD    Chief Complaint  Patient presents with  . Atrial Fibrillation     History of Present Illness: [name] [name] is a 82 y.o. male who presents today for electrophysiology evaluation. He had some AF in may after a year without afib.  He was evaluated by Dr Percival Spanish and had abnormal ETT.  He had cath and underwent PCI.  He has done very well since.  Denies any further afib.  Energy has improved.  Today, he denies symptoms of palpitations, chest pain, orthopnea, PND, lower extremity edema, claudication, dizziness, presyncope, syncope, bleeding, or neurologic sequela. The patient is tolerating medications without difficulties and is otherwise without complaint today.    Past Medical History  Diagnosis Date  . Hyperlipidemia   . HTN (hypertension)   . Paroxysmal atrial fibrillation (HCC)     a. s/p ablation 12/2014: on coumadin  . GERD (gastroesophageal reflux disease)   . Glaucoma   . Hx of colonic polyps     tubular adenoma   . Nephrolithiasis   . Hemorrhoids     internal  . Pancolonic diverticulosis   . Kidney stone   . Coronary artery disease     a. LHC on 04/06/16 which revealed 40% occl pRCA, 85% occl D1, 75% occl Ramus, 65% occl mLCx, 45% occl pLAD, 90% occl pLAD and nl LV function. s/p PCI/DES to prox LAD. Medical therapy for diag disease.    . Melanoma of cheek [facility])    Past Surgical History  Procedure Laterality Date  . Vasectomy    . Inguinal hernia repair Right 04/1990    Dr. [name]  . Cataract extraction w/ intraocular lens  implant, bilateral Bilateral   . Bladder surgery      ruptured blood vessel in the bladder 3 weeks S/P ureter stones removed  . Cystoscopy/retrograde/ureteroscopy/stone extraction with basket Left     removed 2 stones from left ureter    . Coronary angioplasty with stent placement  04/06/2016    "1 stent"  . Colonoscopy  06-29-12    per Dr. [name], adenomatous polyps, repeat in 3 yrs   . Tee without cardioversion N/A 11/11/2014    Procedure: TRANSESOPHAGEAL ECHOCARDIOGRAM (TEE);  Surgeon: [name], MD;  Location: [facility] ENDOSCOPY;  Service: Cardiovascular;  Laterality: N/A;  needs INR before case  . Atrial fibrillation ablation N/A 11/12/2014    Procedure: ATRIAL FIBRILLATION ABLATION;  Surgeon: [name], MD;  Location: [facility] CATH LAB;  Service: Cardiovascular;  Laterality: N/A;  . Laparoscopic cholecystectomy  02/1990    Dr. [name]  . Melanoma excision Right     "@ cheek near ear"  . Cardiac catheterization N/A 04/06/2016    Procedure: Left Heart Cath and Coronary Angiography;  Surgeon: [name], MD;  Location: [place] CV LAB;  Service: Cardiovascular;  Laterality: N/A;  . Cardiac catheterization N/A 04/06/2016    Procedure: Coronary Stent Intervention;  Surgeon: [name], MD;  Location: [place] CV LAB;  Service: Cardiovascular;  Laterality: N/A;     Current Outpatient Prescriptions  Medication Sig Dispense Refill  . acetaminophen (TYLENOL) 325 MG tablet Take 325 mg by mouth 2 (two) times daily.     Marland Kitchen aspirin 81 MG chewable tablet Chew 1 tablet (81 mg total) by mouth daily.    Marland Kitchen  atorvastatin (LIPITOR) 80 MG tablet Take 1 tablet (80 mg total) by mouth daily. 90 tablet 3  . cetirizine (ZYRTEC) 10 MG tablet Take 10 mg by mouth every morning.     . clopidogrel (PLAVIX) 75 MG tablet Take 1 tablet (75 mg total) by mouth daily with breakfast. 90 tablet 3  . latanoprost (XALATAN) 0.005 % ophthalmic solution Place 1 drop into both eyes at bedtime.     Marland Kitchen lisinopril (PRINIVIL,ZESTRIL) 10 MG tablet Take 10 mg by mouth at bedtime.     Marland Kitchen loratadine (CLARITIN) 10 MG tablet Take 10 mg by mouth at bedtime.     . metoprolol (LOPRESSOR) 50 MG tablet Take 0.5 tablets (25 mg total) by mouth 2 (two) times daily. 90 tablet 3   . mometasone (NASONEX) 50 MCG/ACT nasal spray Place 2 sprays into both nostrils daily as needed (allergies).     . Multiple Vitamins-Minerals (CENTRUM SILVER PO) Take 1 tablet by mouth every morning.     . nitroGLYCERIN (NITROSTAT) 0.4 MG SL tablet Place 1 tablet (0.4 mg total) under the tongue every 5 (five) minutes as needed for chest pain. 30 tablet 12  . pantoprazole (PROTONIX) 40 MG tablet Take 1 tablet (40 mg total) by mouth daily. 90 tablet 3  . potassium chloride (KLOR-CON) 10 MEQ CR tablet Take 10 mEq by mouth every morning.     . tamsulosin (FLOMAX) 0.4 MG CAPS Take 0.4 mg by mouth 2 (two) times daily.     Marland Kitchen warfarin (COUMADIN) 5 MG tablet TAKE 1 TABLET BY MOUTH ONCE DAILY OR AS DIRECTED BY COUMADIN CLINIC 30 tablet 5   No current facility-administered medications for this visit.    Allergies:   Sulfa antibiotics and Sulfamethoxazole   Social History:  The patient  reports that he has never smoked. He has never used smokeless tobacco. He reports that he does not drink alcohol or use illicit drugs.   Family History:  The patient's  family history includes Heart attack (age of onset: 62) in his father; Heart disease in his father; Ovarian cancer in his mother.    ROS:  Please see the history of present illness.   All other systems are reviewed and negative.    PHYSICAL EXAM: VS:  BP 140/68 mmHg  Pulse 53  Ht 5\' 11"  (1.803 m)  Wt 212 lb 12.8 oz (96.525 kg)  BMI 29.69 kg/m2 , BMI Body mass index is 29.69 kg/(m^2). GEN: Well nourished, well developed, in no acute distress HEENT: normal Neck: no JVD, carotid bruits, or masses Cardiac: RRR; no murmurs, rubs, or gallops,no edema  Respiratory:  clear to auscultation bilaterally, normal work of breathing GI: soft, nontender, nondistended, + BS MS: no deformity or atrophy Skin: warm and dry  Neuro:  Strength and sensation are intact Psych: euthymic mood, full affect  EKG:  EKG is ordered today. The ekg ordered today shows  sinus rhythm    Recent Labs: 08/25/2015: ALT 22 03/15/2016: TSH 1.17 04/07/2016: BUN 14; Creatinine, Ser 1.16; Hemoglobin 11.5*; Platelets 172; Potassium 3.8; Sodium 139    Lipid Panel     Component Value Date/Time   CHOL 171 08/25/2015 0929   TRIG 179.0* 08/25/2015 0929   HDL 44.10 08/25/2015 0929   CHOLHDL 4 08/25/2015 0929   VLDL 35.8 08/25/2015 0929   LDLCALC 91 08/25/2015 0929     Wt Readings from Last 3 Encounters:  04/23/16 212 lb 12.8 oz (96.525 kg)  04/19/16 213 lb 6.4 oz (96.798 kg)  04/07/16  209 lb 7 oz (95 kg)     ASSESSMENT AND PLAN:  1.  Paroxysmal atrial fibrillation Doing well s/p ablation  No changes today Continue coumadin, chads2vasc score is at least 3 If afib increases then we could consider repeat ablation  2. htn Stable No change required today  3. CAD S/p recent PCI Stable No change required today   Follow-up:  Return to see Dr Percival Spanish in 3 months I will see again in 6 months  Signed, [name], MD  04/23/2016 5:12 PM     [address] [place] [id number] [phone] (office) [phone] (fax)

## 2016-04-23 NOTE — Patient Instructions (Addendum)

## 2016-04-28 NOTE — Addendum Note (Signed)
Addended by: Freada Bergeron on: 04/28/2016 05:05 PM   Modules accepted: Orders

## 2016-05-06 ENCOUNTER — Ambulatory Visit (INDEPENDENT_AMBULATORY_CARE_PROVIDER_SITE_OTHER): Payer: Medicare Other | Admitting: *Deleted

## 2016-05-06 DIAGNOSIS — I4891 Unspecified atrial fibrillation: Secondary | ICD-10-CM | POA: Diagnosis not present

## 2016-05-06 DIAGNOSIS — Z5181 Encounter for therapeutic drug level monitoring: Secondary | ICD-10-CM | POA: Diagnosis not present

## 2016-05-06 DIAGNOSIS — Z7901 Long term (current) use of anticoagulants: Secondary | ICD-10-CM

## 2016-05-06 LAB — POCT INR: INR: 2.6

## 2016-05-18 ENCOUNTER — Encounter: Payer: Self-pay | Admitting: Family Medicine

## 2016-05-18 ENCOUNTER — Ambulatory Visit (INDEPENDENT_AMBULATORY_CARE_PROVIDER_SITE_OTHER): Payer: Medicare Other | Admitting: Family Medicine

## 2016-05-18 VITALS — BP 122/50 | HR 73 | Temp 98.8°F | Ht 71.0 in | Wt 214.0 lb

## 2016-05-18 DIAGNOSIS — R1032 Left lower quadrant pain: Secondary | ICD-10-CM

## 2016-05-18 DIAGNOSIS — I6523 Occlusion and stenosis of bilateral carotid arteries: Secondary | ICD-10-CM | POA: Diagnosis not present

## 2016-05-18 MED ORDER — CIPROFLOXACIN HCL 500 MG PO TABS: 500.0000 mg | ORAL_TABLET | Freq: Two times a day (BID) | ORAL | Status: DC

## 2016-05-18 NOTE — Progress Notes (Signed)
Subjective:    Patient ID: [name] [name], male    DOB: July 23, 1929, 82 y.o.   MRN: [id number]  HPI Patient seen for acute visit. Left lower quadrant abdominal pain. Onset 2-3 days ago. History of diverticulosis. Last colonoscopy August 2013. History of previous acute diverticulitis and states current symptoms are very similar. Pain is relatively mild at this time. No fevers or chills. Appetite fair. No nausea or vomiting. No stool changes. Last bowel movement this morning. No hematochezia. Patient takes Coumadin. Last INR end of June 2.6. Patient has taken Cipro in the past as a single drug which apparently has worked. He does not recall taking Flagyl.  Past Medical History  Diagnosis Date  . Hyperlipidemia   . HTN (hypertension)   . Paroxysmal atrial fibrillation (HCC)     a. s/p ablation 12/2014: on coumadin  . GERD (gastroesophageal reflux disease)   . Glaucoma   . Hx of colonic polyps     tubular adenoma   . Nephrolithiasis   . Hemorrhoids     internal  . Pancolonic diverticulosis   . Kidney stone   . Coronary artery disease     a. LHC on 04/06/16 which revealed 40% occl pRCA, 85% occl D1, 75% occl Ramus, 65% occl mLCx, 45% occl pLAD, 90% occl pLAD and nl LV function. s/p PCI/DES to prox LAD. Medical therapy for diag disease.    . Melanoma of cheek [facility])    Past Surgical History  Procedure Laterality Date  . Vasectomy    . Inguinal hernia repair Right 04/1990    Dr. [name]  . Cataract extraction w/ intraocular lens  implant, bilateral Bilateral   . Bladder surgery      ruptured blood vessel in the bladder 3 weeks S/P ureter stones removed  . Cystoscopy/retrograde/ureteroscopy/stone extraction with basket Left     removed 2 stones from left ureter  . Coronary angioplasty with stent placement  04/06/2016    "1 stent"  . Colonoscopy  06-29-12    per Dr. [name], adenomatous polyps, repeat in 3 yrs   . Tee without cardioversion N/A 11/11/2014    Procedure:  TRANSESOPHAGEAL ECHOCARDIOGRAM (TEE);  Surgeon: [name], MD;  Location: [facility] ENDOSCOPY;  Service: Cardiovascular;  Laterality: N/A;  needs INR before case  . Atrial fibrillation ablation N/A 11/12/2014    Procedure: ATRIAL FIBRILLATION ABLATION;  Surgeon: [name], MD;  Location: [facility] CATH LAB;  Service: Cardiovascular;  Laterality: N/A;  . Laparoscopic cholecystectomy  02/1990    Dr. [name]  . Melanoma excision Right     "@ cheek near ear"  . Cardiac catheterization N/A 04/06/2016    Procedure: Left Heart Cath and Coronary Angiography;  Surgeon: [name], MD;  Location: [place] CV LAB;  Service: Cardiovascular;  Laterality: N/A;  . Cardiac catheterization N/A 04/06/2016    Procedure: Coronary Stent Intervention;  Surgeon: [name], MD;  Location: [place] CV LAB;  Service: Cardiovascular;  Laterality: N/A;    reports that he has never smoked. He has never used smokeless tobacco. He reports that he does not drink alcohol or use illicit drugs. family history includes Heart attack (age of onset: 47) in his father; Heart disease in his father; Ovarian cancer in his mother. Allergies  Allergen Reactions  . Sulfa Antibiotics Other (See Comments)    Severe peeling of the skin on the groin area  . Sulfamethoxazole Other (See Comments)    REACTION: rash  Review of Systems  Constitutional: Negative for fever and chills.  Respiratory: Negative for shortness of breath.   Cardiovascular: Negative for chest pain.  Gastrointestinal: Positive for abdominal pain. Negative for nausea, vomiting, diarrhea, constipation and abdominal distention.       Objective:   Physical Exam  Constitutional: He appears well-developed and well-nourished.  Cardiovascular: Normal rate and regular rhythm.   Pulmonary/Chest: Effort normal and breath sounds normal. No respiratory distress. He has no wheezes. He has no rales.  Abdominal: Soft. Bowel sounds are normal. He exhibits no  distension and no mass. There is no rebound and no guarding.  Minimal tenderness left lower quadrant to deep palpation. No guarding. No mass.          Assessment & Plan:  Left lower quadrant abdominal pain. Suspect recurrent acute diverticulitis. Cipro 500 mg twice daily for 10 days. Follow-up promptly if not seeing good improvement over the last couple days. Add Flagyl if any worsening symptoms or not improving next few days.   Eulas Post MD [facility]

## 2016-05-18 NOTE — Progress Notes (Signed)
Pre visit review using our clinic review tool, if applicable. No additional management support is needed unless otherwise documented below in the visit note. 

## 2016-05-18 NOTE — Patient Instructions (Signed)
Diverticulitis °Diverticulitis is inflammation or infection of small pouches in your colon that form when you have a condition called diverticulosis. The pouches in your colon are called diverticula. Your colon, or large intestine, is where water is absorbed and stool is formed. °Complications of diverticulitis can include: °· Bleeding. °· Severe infection. °· Severe pain. °· Perforation of your colon. °· Obstruction of your colon. °CAUSES  °Diverticulitis is caused by bacteria. °Diverticulitis happens when stool becomes trapped in diverticula. This allows bacteria to grow in the diverticula, which can lead to inflammation and infection. °RISK FACTORS °People with diverticulosis are at risk for diverticulitis. Eating a diet that does not include enough fiber from fruits and vegetables may make diverticulitis more likely to develop. °SYMPTOMS  °Symptoms of diverticulitis may include: °· Abdominal pain and tenderness. The pain is normally located on the left side of the abdomen, but may occur in other areas. °· Fever and chills. °· Bloating. °· Cramping. °· Nausea. °· Vomiting. °· Constipation. °· Diarrhea. °· Blood in your stool. °DIAGNOSIS  °Your health care provider will ask you about your medical history and do a physical exam. You may need to have tests done because many medical conditions can cause the same symptoms as diverticulitis. Tests may include: °· Blood tests. °· Urine tests. °· Imaging tests of the abdomen, including X-rays and CT scans. °When your condition is under control, your health care provider may recommend that you have a colonoscopy. A colonoscopy can show how severe your diverticula are and whether something else is causing your symptoms. °TREATMENT  °Most cases of diverticulitis are mild and can be treated at home. Treatment may include: °· Taking over-the-counter pain medicines. °· Following a clear liquid diet. °· Taking antibiotic medicines by mouth for 7-10 days. °More severe cases may  be treated at a hospital. Treatment may include: °· Not eating or drinking. °· Taking prescription pain medicine. °· Receiving antibiotic medicines through an IV tube. °· Receiving fluids and nutrition through an IV tube. °· Surgery. °HOME CARE INSTRUCTIONS  °· Follow your health care provider's instructions carefully. °· Follow a full liquid diet or other diet as directed by your health care provider. After your symptoms improve, your health care provider may tell you to change your diet. He or she may recommend you eat a high-fiber diet. Fruits and vegetables are good sources of fiber. Fiber makes it easier to pass stool. °· Take fiber supplements or probiotics as directed by your health care provider. °· Only take medicines as directed by your health care provider. °· Keep all your follow-up appointments. °SEEK MEDICAL CARE IF:  °· Your pain does not improve. °· You have a hard time eating food. °· Your bowel movements do not return to normal. °SEEK IMMEDIATE MEDICAL CARE IF:  °· Your pain becomes worse. °· Your symptoms do not get better. °· Your symptoms suddenly get worse. °· You have a fever. °· You have repeated vomiting. °· You have bloody or black, tarry stools. °MAKE SURE YOU:  °· Understand these instructions. °· Will watch your condition. °· Will get help right away if you are not doing well or get worse. °  °This information is not intended to replace advice given to you by your health care provider. Make sure you discuss any questions you have with your health care provider. °  °Document Released: 08/04/2005 Document Revised: 10/30/2013 Document Reviewed: 09/19/2013 °Elsevier Interactive Patient Education ©2016 [employer] Inc. ° °

## 2016-05-19 ENCOUNTER — Ambulatory Visit: Payer: Medicare Other | Admitting: Family Medicine

## 2016-05-31 DIAGNOSIS — H401132 Primary open-angle glaucoma, bilateral, moderate stage: Secondary | ICD-10-CM | POA: Diagnosis not present

## 2016-06-03 ENCOUNTER — Ambulatory Visit (INDEPENDENT_AMBULATORY_CARE_PROVIDER_SITE_OTHER): Payer: Medicare Other | Admitting: *Deleted

## 2016-06-03 ENCOUNTER — Encounter: Payer: Self-pay | Admitting: Internal Medicine

## 2016-06-03 DIAGNOSIS — I4891 Unspecified atrial fibrillation: Secondary | ICD-10-CM

## 2016-06-03 DIAGNOSIS — Z7901 Long term (current) use of anticoagulants: Secondary | ICD-10-CM | POA: Diagnosis not present

## 2016-06-03 DIAGNOSIS — Z5181 Encounter for therapeutic drug level monitoring: Secondary | ICD-10-CM | POA: Diagnosis not present

## 2016-06-03 LAB — POCT INR: INR: 2.5

## 2016-06-29 DIAGNOSIS — Z85820 Personal history of malignant melanoma of skin: Secondary | ICD-10-CM | POA: Diagnosis not present

## 2016-06-29 DIAGNOSIS — L821 Other seborrheic keratosis: Secondary | ICD-10-CM | POA: Diagnosis not present

## 2016-06-29 DIAGNOSIS — L570 Actinic keratosis: Secondary | ICD-10-CM | POA: Diagnosis not present

## 2016-06-29 DIAGNOSIS — D225 Melanocytic nevi of trunk: Secondary | ICD-10-CM | POA: Diagnosis not present

## 2016-06-29 DIAGNOSIS — L814 Other melanin hyperpigmentation: Secondary | ICD-10-CM | POA: Diagnosis not present

## 2016-07-01 ENCOUNTER — Ambulatory Visit (INDEPENDENT_AMBULATORY_CARE_PROVIDER_SITE_OTHER): Payer: Medicare Other

## 2016-07-01 DIAGNOSIS — Z5181 Encounter for therapeutic drug level monitoring: Secondary | ICD-10-CM | POA: Diagnosis not present

## 2016-07-01 DIAGNOSIS — Z7901 Long term (current) use of anticoagulants: Secondary | ICD-10-CM

## 2016-07-01 DIAGNOSIS — I4891 Unspecified atrial fibrillation: Secondary | ICD-10-CM | POA: Diagnosis not present

## 2016-07-01 LAB — POCT INR: INR: 2.4

## 2016-07-05 ENCOUNTER — Encounter: Payer: Self-pay | Admitting: Cardiology

## 2016-07-09 ENCOUNTER — Other Ambulatory Visit: Payer: Self-pay

## 2016-07-18 NOTE — Progress Notes (Signed)
HPI  [name] [name] is a 82 y.o. male with a history of PAF s/p ablation (11/2014) on Coumadin, HTN, HLD and recently diagnosed CAD s/p DES to LAD (03/2016) who presents for follow up.  Since I last saw him he saw Dr. [name] and no changes were suggested.  The patient denies any new symptoms such as chest discomfort, neck or arm discomfort. There has been no new shortness of breath, PND or orthopnea. There have been no reported palpitations, presyncope or syncope.  He is walking for exercise.    Allergies  Allergen Reactions  . Sulfa Antibiotics Other (See Comments)    Severe peeling of the skin on the groin area  . Sulfamethoxazole Other (See Comments)    REACTION: rash    Current Outpatient Prescriptions  Medication Sig Dispense Refill  . acetaminophen (TYLENOL) 325 MG tablet Take 325 mg by mouth 2 (two) times daily.     Marland Kitchen atorvastatin (LIPITOR) 80 MG tablet Take 1 tablet (80 mg total) by mouth daily. 90 tablet 3  . cetirizine (ZYRTEC) 10 MG tablet Take 10 mg by mouth every morning.     . ciprofloxacin (CIPRO) 500 MG tablet Take 1 tablet (500 mg total) by mouth 2 (two) times daily. 20 tablet 0  . clopidogrel (PLAVIX) 75 MG tablet Take 1 tablet (75 mg total) by mouth daily with breakfast. 90 tablet 3  . latanoprost (XALATAN) 0.005 % ophthalmic solution Place 1 drop into both eyes at bedtime.     Marland Kitchen lisinopril (PRINIVIL,ZESTRIL) 10 MG tablet Take 10 mg by mouth at bedtime.     Marland Kitchen loratadine (CLARITIN) 10 MG tablet Take 10 mg by mouth at bedtime.     . metoprolol (LOPRESSOR) 50 MG tablet Take 0.5 tablets (25 mg total) by mouth 2 (two) times daily. 90 tablet 3  . mometasone (NASONEX) 50 MCG/ACT nasal spray Place 2 sprays into both nostrils daily as needed (allergies).     . Multiple Vitamins-Minerals (CENTRUM SILVER PO) Take 1 tablet by mouth every morning.     . nitroGLYCERIN (NITROSTAT) 0.4 MG SL tablet Place 1 tablet (0.4 mg total) under the tongue every 5 (five) minutes as needed for  chest pain. 30 tablet 12  . pantoprazole (PROTONIX) 40 MG tablet Take 1 tablet (40 mg total) by mouth daily. 90 tablet 3  . potassium chloride (KLOR-CON) 10 MEQ CR tablet Take 10 mEq by mouth every morning.     . tamsulosin (FLOMAX) 0.4 MG CAPS Take 0.4 mg by mouth 2 (two) times daily.     Marland Kitchen warfarin (COUMADIN) 5 MG tablet TAKE 1 TABLET BY MOUTH ONCE DAILY OR AS DIRECTED BY COUMADIN CLINIC 30 tablet 5   No current facility-administered medications for this visit.     Past Medical History:  Diagnosis Date  . Coronary artery disease    a. LHC on 04/06/16 which revealed 40% occl pRCA, 85% occl D1, 75% occl Ramus, 65% occl mLCx, 45% occl pLAD, 90% occl pLAD and nl LV function. s/p PCI/DES to prox LAD. Medical therapy for diag disease.    Marland Kitchen GERD (gastroesophageal reflux disease)   . Glaucoma   . Hemorrhoids    internal  . HTN (hypertension)   . Hx of colonic polyps    tubular adenoma   . Hyperlipidemia   . Kidney stone   . Melanoma of cheek ([place])   . Nephrolithiasis   . Pancolonic diverticulosis   . Paroxysmal atrial fibrillation ([place])    a.  s/p ablation 12/2014: on coumadin    Past Surgical History:  Procedure Laterality Date  . ATRIAL FIBRILLATION ABLATION N/A 11/12/2014   Procedure: ATRIAL FIBRILLATION ABLATION;  Surgeon: [name], MD;  Location: [facility] CATH LAB;  Service: Cardiovascular;  Laterality: N/A;  . BLADDER SURGERY     ruptured blood vessel in the bladder 3 weeks S/P ureter stones removed  . CARDIAC CATHETERIZATION N/A 04/06/2016   Procedure: Left Heart Cath and Coronary Angiography;  Surgeon: [name], MD;  Location: [place] CV LAB;  Service: Cardiovascular;  Laterality: N/A;  . CARDIAC CATHETERIZATION N/A 04/06/2016   Procedure: Coronary Stent Intervention;  Surgeon: [name], MD;  Location: [place] CV LAB;  Service: Cardiovascular;  Laterality: N/A;  . CATARACT EXTRACTION W/ INTRAOCULAR LENS  IMPLANT, BILATERAL Bilateral   . COLONOSCOPY  06-29-12   per  Dr. [name], adenomatous polyps, repeat in 3 yrs   . CORONARY ANGIOPLASTY WITH STENT PLACEMENT  04/06/2016   "1 stent"  . CYSTOSCOPY/RETROGRADE/URETEROSCOPY/STONE EXTRACTION WITH BASKET Left    removed 2 stones from left ureter  . INGUINAL HERNIA REPAIR Right 04/1990   Dr. [name]  . LAPAROSCOPIC CHOLECYSTECTOMY  02/1990   Dr. [name]  . MELANOMA EXCISION Right    "@ cheek near ear"  . TEE WITHOUT CARDIOVERSION N/A 11/11/2014   Procedure: TRANSESOPHAGEAL ECHOCARDIOGRAM (TEE);  Surgeon: [name], MD;  Location: [facility] ENDOSCOPY;  Service: Cardiovascular;  Laterality: N/A;  needs INR before case  . VASECTOMY      ROS:   As stated in the HPI and negative for all other systems.   PHYSICAL EXAM BP (!) 124/52   Pulse 70   Ht 5\' 11"  (1.803 m)   Wt 211 lb (95.7 kg)   BMI 29.43 kg/m  GENERAL:  Well appearing NECK:  No jugular venous distention, waveform within normal limits, carotid upstroke brisk and symmetric, no bruits, no thyromegaly LUNGS:  Clear to auscultation bilaterally BACK:  No CVA tenderness CHEST:  Unremarkable HEART:  PMI not displaced or sustained,S1 and S2 within normal limits, no S3, no clicks, no rubs, no murmurs, irregular ABD:  Flat, positive bowel sounds normal in frequency in pitch, no bruits, no rebound, no guarding, no midline pulsatile mass, no hepatomegaly, no splenomegaly EXT:  2 plus pulses throughout, no edema, no cyanosis no clubbing   Lab Results  Component Value Date   CHOL 171 08/25/2015   TRIG 179.0 (H) 08/25/2015   HDL 44.10 08/25/2015   Aten 91 08/25/2015    ASSESSMENT AND PLAN  CAD - The patient has no new sypmtoms.  No further cardiovascular testing is indicated.  We will continue with aggressive risk reduction and meds as listed.  I will continue Plavix/warfarin for 12 months barring any complications.   ATRIAL FIBRILLATION -  [name] has a CHA2DS2 - VASc score of 3 with a risk of stroke of 3.3%.  He will continue  warfarin.    HYPERTENSION -  I reviewed dietary and his blood pressure is well-controlled. He'll continue the meds as listed.  FATIGUE -   This went away with his angioplasty.

## 2016-07-19 ENCOUNTER — Encounter: Payer: Self-pay | Admitting: Cardiology

## 2016-07-19 ENCOUNTER — Ambulatory Visit (INDEPENDENT_AMBULATORY_CARE_PROVIDER_SITE_OTHER): Payer: Medicare Other | Admitting: Cardiology

## 2016-07-19 VITALS — BP 124/52 | HR 70 | Ht 71.0 in | Wt 211.0 lb

## 2016-07-19 DIAGNOSIS — I482 Chronic atrial fibrillation, unspecified: Secondary | ICD-10-CM

## 2016-07-19 DIAGNOSIS — R5383 Other fatigue: Secondary | ICD-10-CM

## 2016-07-19 DIAGNOSIS — I6523 Occlusion and stenosis of bilateral carotid arteries: Secondary | ICD-10-CM

## 2016-07-19 DIAGNOSIS — I2510 Atherosclerotic heart disease of native coronary artery without angina pectoris: Secondary | ICD-10-CM

## 2016-07-19 NOTE — Patient Instructions (Signed)

## 2016-07-21 DIAGNOSIS — J302 Other seasonal allergic rhinitis: Secondary | ICD-10-CM | POA: Diagnosis not present

## 2016-07-21 DIAGNOSIS — R490 Dysphonia: Secondary | ICD-10-CM | POA: Diagnosis not present

## 2016-07-21 DIAGNOSIS — F458 Other somatoform disorders: Secondary | ICD-10-CM | POA: Diagnosis not present

## 2016-07-21 DIAGNOSIS — K219 Gastro-esophageal reflux disease without esophagitis: Secondary | ICD-10-CM | POA: Diagnosis not present

## 2016-07-29 ENCOUNTER — Ambulatory Visit (INDEPENDENT_AMBULATORY_CARE_PROVIDER_SITE_OTHER): Payer: Medicare Other | Admitting: *Deleted

## 2016-07-29 DIAGNOSIS — I4891 Unspecified atrial fibrillation: Secondary | ICD-10-CM | POA: Diagnosis not present

## 2016-07-29 DIAGNOSIS — Z7901 Long term (current) use of anticoagulants: Secondary | ICD-10-CM | POA: Diagnosis not present

## 2016-07-29 DIAGNOSIS — Z5181 Encounter for therapeutic drug level monitoring: Secondary | ICD-10-CM

## 2016-07-29 LAB — POCT INR: INR: 2

## 2016-07-29 MED ORDER — WARFARIN SODIUM 5 MG PO TABS: ORAL_TABLET | ORAL | 3 refills | Status: DC

## 2016-08-25 ENCOUNTER — Encounter: Payer: Self-pay | Admitting: Family Medicine

## 2016-08-25 ENCOUNTER — Ambulatory Visit (INDEPENDENT_AMBULATORY_CARE_PROVIDER_SITE_OTHER): Payer: Medicare Other | Admitting: Family Medicine

## 2016-08-25 VITALS — BP 131/67 | HR 50 | Temp 98.0°F | Ht 71.0 in | Wt 208.0 lb

## 2016-08-25 DIAGNOSIS — I25119 Atherosclerotic heart disease of native coronary artery with unspecified angina pectoris: Secondary | ICD-10-CM

## 2016-08-25 DIAGNOSIS — N401 Enlarged prostate with lower urinary tract symptoms: Secondary | ICD-10-CM

## 2016-08-25 DIAGNOSIS — E785 Hyperlipidemia, unspecified: Secondary | ICD-10-CM | POA: Diagnosis not present

## 2016-08-25 DIAGNOSIS — Z23 Encounter for immunization: Secondary | ICD-10-CM | POA: Diagnosis not present

## 2016-08-25 DIAGNOSIS — I480 Paroxysmal atrial fibrillation: Secondary | ICD-10-CM

## 2016-08-25 DIAGNOSIS — I10 Essential (primary) hypertension: Secondary | ICD-10-CM

## 2016-08-25 DIAGNOSIS — N138 Other obstructive and reflux uropathy: Secondary | ICD-10-CM

## 2016-08-25 DIAGNOSIS — I6523 Occlusion and stenosis of bilateral carotid arteries: Secondary | ICD-10-CM | POA: Diagnosis not present

## 2016-08-25 LAB — BASIC METABOLIC PANEL
BUN: 19 mg/dL (ref 6–23)
CALCIUM: 9.3 mg/dL (ref 8.4–10.5)
CO2: 32 mEq/L (ref 19–32)
CREATININE: 1.05 mg/dL (ref 0.40–1.50)
Chloride: 103 mEq/L (ref 96–112)
GFR: 71.32 mL/min (ref >60.00)
GLUCOSE: 115 mg/dL — AB (ref 70–99)
Potassium: 3.8 mEq/L (ref 3.5–5.1)
Sodium: 142 mEq/L (ref 135–145)

## 2016-08-25 LAB — HEPATIC FUNCTION PANEL
ALBUMIN: 4.5 g/dL (ref 3.5–5.2)
ALT: 24 U/L (ref 0–53)
AST: 20 U/L (ref 0–37)
Alkaline Phosphatase: 60 U/L (ref 39–117)
Bilirubin, Direct: 0.1 mg/dL (ref 0.0–0.3)
TOTAL PROTEIN: 6.7 g/dL (ref 6.0–8.3)
Total Bilirubin: 0.7 mg/dL (ref 0.2–1.2)

## 2016-08-25 LAB — POC URINALSYSI DIPSTICK (AUTOMATED)
Bilirubin, UA: NEGATIVE
GLUCOSE UA: NEGATIVE
Ketones, UA: NEGATIVE
Leukocytes, UA: NEGATIVE
Nitrite, UA: NEGATIVE
PH UA: 5
PROTEIN UA: NEGATIVE
SPEC GRAV UA: 1.02
UROBILINOGEN UA: 0.2

## 2016-08-25 LAB — CBC WITH DIFFERENTIAL/PLATELET
BASOS ABS: 0 10*3/uL (ref 0.0–0.1)
BASOS PCT: 0.2 % (ref 0.0–3.0)
EOS ABS: 0.4 10*3/uL (ref 0.0–0.7)
Eosinophils Relative: 4.1 % (ref 0.0–5.0)
HCT: 39.6 % (ref 39.0–52.0)
HEMOGLOBIN: 13.2 g/dL (ref 13.0–17.0)
Lymphocytes Relative: 26.7 % (ref 12.0–46.0)
Lymphs Abs: 2.4 10*3/uL (ref 0.7–4.0)
MCHC: 33.3 g/dL (ref 30.0–36.0)
MCV: 95.4 fl (ref 78.0–100.0)
MONO ABS: 0.7 10*3/uL (ref 0.1–1.0)
Monocytes Relative: 8.4 % (ref 3.0–12.0)
Neutro Abs: 5.4 10*3/uL (ref 1.4–7.7)
Neutrophils Relative %: 60.6 % (ref 43.0–77.0)
Platelets: 187 10*3/uL (ref 150.0–400.0)
RBC: 4.15 Mil/uL — ABNORMAL LOW (ref 4.22–5.81)
RDW: 14.2 % (ref 11.5–15.5)
WBC: 8.9 10*3/uL (ref 4.0–10.5)

## 2016-08-25 LAB — TSH: TSH: 1.48 u[IU]/mL (ref 0.35–4.50)

## 2016-08-25 LAB — LIPID PANEL
CHOL/HDL RATIO: 3
CHOLESTEROL: 127 mg/dL (ref 0–200)
HDL: 39.6 mg/dL (ref >39.00)
LDL CALC: 58 mg/dL (ref 0–99)
NonHDL: 87.39
TRIGLYCERIDES: 145 mg/dL (ref 0.0–149.0)
VLDL: 29 mg/dL (ref 0.0–40.0)

## 2016-08-25 LAB — PSA: PSA: 1.44 ng/mL (ref 0.10–4.00)

## 2016-08-25 NOTE — Progress Notes (Signed)
Pre visit review using our clinic review tool, if applicable. No additional management support is needed unless otherwise documented below in the visit note. 

## 2016-08-25 NOTE — Progress Notes (Signed)
   Subjective:    Patient ID: [name] [name], male    DOB: Sep 08, 1929, 82 y.o.   MRN: [id number]  HPI 82 yr old male to follow up on several issues. He sees Cardiology regularly to manage his CAD and atrial fibrillation. His BP is stable. He will see Dr. Junious Silk for a prostate check sometime soon. He feels well and has no complaints.    Review of Systems  Constitutional: Negative.   HENT: Negative.   Eyes: Negative.   Respiratory: Negative.   Cardiovascular: Negative.   Gastrointestinal: Negative.   Genitourinary: Negative.   Musculoskeletal: Negative.   Skin: Negative.   Neurological: Negative.   Psychiatric/Behavioral: Negative.        Objective:   Physical Exam  Constitutional: He is oriented to person, place, and time. He appears well-developed and well-nourished. No distress.  HENT:  Head: Normocephalic and atraumatic.  Right Ear: External ear normal.  Left Ear: External ear normal.  Nose: Nose normal.  Mouth/Throat: Oropharynx is clear and moist. No oropharyngeal exudate.  Eyes: Conjunctivae and EOM are normal. Pupils are equal, round, and reactive to light. Right eye exhibits no discharge. Left eye exhibits no discharge. No scleral icterus.  Neck: Neck supple. No JVD present. No tracheal deviation present. No thyromegaly present.  Cardiovascular: Normal rate, regular rhythm, normal heart sounds and intact distal pulses.  Exam reveals no gallop and no friction rub.   No murmur heard. Pulmonary/Chest: Effort normal and breath sounds normal. No respiratory distress. He has no wheezes. He has no rales. He exhibits no tenderness.  Abdominal: Soft. Bowel sounds are normal. He exhibits no distension and no mass. There is no tenderness. There is no rebound and no guarding.  Musculoskeletal: Normal range of motion. He exhibits no edema or tenderness.  Lymphadenopathy:    He has no cervical adenopathy.  Neurological: He is alert and oriented to person, place, and time. He has  normal reflexes. No cranial nerve deficit. He exhibits normal muscle tone. Coordination normal.  Skin: Skin is warm and dry. No rash noted. He is not diaphoretic. No erythema. No pallor.  Psychiatric: He has a normal mood and affect. His behavior is normal. Judgment and thought content normal.          Assessment & Plan:  His atrial fibrillation and CAD and HTN are well controlled. He will see Urology soon. He is given a flu shot and a TDaP today. We discussed diet and exercise advice.  [name], MD

## 2016-08-27 ENCOUNTER — Encounter: Payer: Self-pay | Admitting: Family Medicine

## 2016-08-27 DIAGNOSIS — N200 Calculus of kidney: Secondary | ICD-10-CM | POA: Diagnosis not present

## 2016-08-27 DIAGNOSIS — R3121 Asymptomatic microscopic hematuria: Secondary | ICD-10-CM | POA: Diagnosis not present

## 2016-08-27 DIAGNOSIS — N400 Enlarged prostate without lower urinary tract symptoms: Secondary | ICD-10-CM | POA: Diagnosis not present

## 2016-09-02 DIAGNOSIS — Z961 Presence of intraocular lens: Secondary | ICD-10-CM | POA: Diagnosis not present

## 2016-09-02 DIAGNOSIS — H401132 Primary open-angle glaucoma, bilateral, moderate stage: Secondary | ICD-10-CM | POA: Diagnosis not present

## 2016-09-09 ENCOUNTER — Ambulatory Visit (INDEPENDENT_AMBULATORY_CARE_PROVIDER_SITE_OTHER): Payer: Medicare Other | Admitting: *Deleted

## 2016-09-09 DIAGNOSIS — Z7901 Long term (current) use of anticoagulants: Secondary | ICD-10-CM

## 2016-09-09 DIAGNOSIS — I4891 Unspecified atrial fibrillation: Secondary | ICD-10-CM

## 2016-09-09 DIAGNOSIS — Z5181 Encounter for therapeutic drug level monitoring: Secondary | ICD-10-CM

## 2016-09-09 LAB — POCT INR: INR: 1.9

## 2016-09-14 ENCOUNTER — Encounter: Payer: Self-pay | Admitting: Family Medicine

## 2016-09-15 ENCOUNTER — Encounter: Payer: Self-pay | Admitting: Family Medicine

## 2016-09-15 NOTE — Telephone Encounter (Signed)
We ran out of time at today's visit to discuss memory issues. I suggest they make an appt to come in just for this subject

## 2016-10-15 ENCOUNTER — Encounter: Payer: Self-pay | Admitting: Internal Medicine

## 2016-10-21 ENCOUNTER — Ambulatory Visit (INDEPENDENT_AMBULATORY_CARE_PROVIDER_SITE_OTHER): Payer: Medicare Other | Admitting: Internal Medicine

## 2016-10-21 ENCOUNTER — Ambulatory Visit (INDEPENDENT_AMBULATORY_CARE_PROVIDER_SITE_OTHER): Payer: Medicare Other | Admitting: *Deleted

## 2016-10-21 ENCOUNTER — Encounter: Payer: Self-pay | Admitting: Internal Medicine

## 2016-10-21 VITALS — BP 132/70 | HR 54 | Ht 71.0 in | Wt 211.4 lb

## 2016-10-21 DIAGNOSIS — I25119 Atherosclerotic heart disease of native coronary artery with unspecified angina pectoris: Secondary | ICD-10-CM | POA: Diagnosis not present

## 2016-10-21 DIAGNOSIS — I209 Angina pectoris, unspecified: Secondary | ICD-10-CM

## 2016-10-21 DIAGNOSIS — I4891 Unspecified atrial fibrillation: Secondary | ICD-10-CM | POA: Diagnosis not present

## 2016-10-21 DIAGNOSIS — I6523 Occlusion and stenosis of bilateral carotid arteries: Secondary | ICD-10-CM

## 2016-10-21 DIAGNOSIS — Z7901 Long term (current) use of anticoagulants: Secondary | ICD-10-CM

## 2016-10-21 DIAGNOSIS — Z5181 Encounter for therapeutic drug level monitoring: Secondary | ICD-10-CM

## 2016-10-21 DIAGNOSIS — I480 Paroxysmal atrial fibrillation: Secondary | ICD-10-CM

## 2016-10-21 LAB — POCT INR: INR: 2.4

## 2016-10-21 NOTE — Patient Instructions (Signed)

## 2016-10-21 NOTE — Progress Notes (Signed)
Electrophysiology Office Note   Date:  10/21/2016   ID:  LORENE FERRARI, DOB 1929-06-25, MRN [id number]  PCP:  [name], MD  Cardiologist:  Dr Percival Spanish Primary Electrophysiologist: [name], MD    Chief Complaint  Patient presents with  . Atrial Fibrillation     History of Present Illness: [name] [name] is a 82 y.o. male who presents today for electrophysiology evaluation.    He has done very well since his last visit.  He had only a single episode of afib in October lasting 19 hours.  Otherwise doing very well.  Today, he denies symptoms of palpitations, chest pain, orthopnea, PND, lower extremity edema, claudication, dizziness, presyncope, syncope, bleeding, or neurologic sequela. The patient is tolerating medications without difficulties and is otherwise without complaint today.    Past Medical History:  Diagnosis Date  . Coronary artery disease    a. LHC on 04/06/16 which revealed 40% occl pRCA, 85% occl D1, 75% occl Ramus, 65% occl mLCx, 45% occl pLAD, 90% occl pLAD and nl LV function. s/p PCI/DES to prox LAD. Medical therapy for diag disease.    Marland Kitchen GERD (gastroesophageal reflux disease)   . Glaucoma   . Hemorrhoids    internal  . HTN (hypertension)   . Hx of colonic polyps    tubular adenoma   . Hyperlipidemia   . Kidney stone   . Melanoma of cheek ([place])   . Nephrolithiasis   . Pancolonic diverticulosis   . Paroxysmal atrial fibrillation (HCC)    a. s/p ablation 12/2014: on coumadin   Past Surgical History:  Procedure Laterality Date  . ATRIAL FIBRILLATION ABLATION N/A 11/12/2014   Procedure: ATRIAL FIBRILLATION ABLATION;  Surgeon: [name], MD;  Location: [facility] CATH LAB;  Service: Cardiovascular;  Laterality: N/A;  . BLADDER SURGERY     ruptured blood vessel in the bladder 3 weeks S/P ureter stones removed  . CARDIAC CATHETERIZATION N/A 04/06/2016   Procedure: Left Heart Cath and Coronary Angiography;  Surgeon: [name], MD;  Location: [place] CV  LAB;  Service: Cardiovascular;  Laterality: N/A;  . CARDIAC CATHETERIZATION N/A 04/06/2016   Procedure: Coronary Stent Intervention;  Surgeon: [name], MD;  Location: [place] CV LAB;  Service: Cardiovascular;  Laterality: N/A;  . CATARACT EXTRACTION W/ INTRAOCULAR LENS  IMPLANT, BILATERAL Bilateral   . COLONOSCOPY  06-29-12   per Dr. [name], adenomatous polyps, repeat in 3 yrs   . CORONARY ANGIOPLASTY WITH STENT PLACEMENT  04/06/2016   "1 stent"  . CYSTOSCOPY/RETROGRADE/URETEROSCOPY/STONE EXTRACTION WITH BASKET Left    removed 2 stones from left ureter  . INGUINAL HERNIA REPAIR Right 04/1990   Dr. [name]  . LAPAROSCOPIC CHOLECYSTECTOMY  02/1990   Dr. [name]  . MELANOMA EXCISION Right    "@ cheek near ear"  . TEE WITHOUT CARDIOVERSION N/A 11/11/2014   Procedure: TRANSESOPHAGEAL ECHOCARDIOGRAM (TEE);  Surgeon: [name], MD;  Location: [facility] ENDOSCOPY;  Service: Cardiovascular;  Laterality: N/A;  needs INR before case  . VASECTOMY       Current Outpatient Prescriptions  Medication Sig Dispense Refill  . acetaminophen (TYLENOL) 325 MG tablet Take 325 mg by mouth 2 (two) times daily.     Marland Kitchen atorvastatin (LIPITOR) 80 MG tablet Take 1 tablet (80 mg total) by mouth daily. 90 tablet 3  . cetirizine (ZYRTEC) 10 MG tablet Take 10 mg by mouth every morning.     . clopidogrel (PLAVIX) 75 MG tablet Take 1 tablet (  75 mg total) by mouth daily with breakfast. 90 tablet 3  . latanoprost (XALATAN) 0.005 % ophthalmic solution Place 1 drop into both eyes at bedtime.     Marland Kitchen lisinopril (PRINIVIL,ZESTRIL) 10 MG tablet Take 10 mg by mouth at bedtime.     Marland Kitchen loratadine (CLARITIN) 10 MG tablet Take 10 mg by mouth at bedtime.     . metoprolol (LOPRESSOR) 50 MG tablet Take 0.5 tablets (25 mg total) by mouth 2 (two) times daily. 90 tablet 3  . mometasone (NASONEX) 50 MCG/ACT nasal spray Place 2 sprays into both nostrils daily as needed (allergies).     . Multiple Vitamins-Minerals (CENTRUM SILVER PO)  Take 1 tablet by mouth every morning.     . nitroGLYCERIN (NITROSTAT) 0.4 MG SL tablet Place 1 tablet (0.4 mg total) under the tongue every 5 (five) minutes as needed for chest pain. 30 tablet 12  . pantoprazole (PROTONIX) 40 MG tablet Take 1 tablet (40 mg total) by mouth daily. 90 tablet 3  . potassium chloride (KLOR-CON) 10 MEQ CR tablet Take 10 mEq by mouth every morning.     . tamsulosin (FLOMAX) 0.4 MG CAPS Take 0.4 mg by mouth 2 (two) times daily.     Marland Kitchen warfarin (COUMADIN) 5 MG tablet Take as directed by coumadin clinic 30 tablet 3   No current facility-administered medications for this visit.     Allergies:   Sulfa antibiotics and Sulfamethoxazole   Social History:  The patient  reports that he has never smoked. He has never used smokeless tobacco. He reports that he does not drink alcohol or use drugs.   Family History:  The patient's  family history includes Heart attack (age of onset: 74) in his father; Heart disease in his father; Ovarian cancer in his mother.    ROS:  Please see the history of present illness.   All other systems are reviewed and negative.    PHYSICAL EXAM: VS:  BP 132/70   Pulse (!) 54   Ht 5\' 11"  (1.803 m)   Wt 211 lb 6.4 oz (95.9 kg)   BMI 29.48 kg/m  , BMI Body mass index is 29.48 kg/m. GEN: Well nourished, well developed, in no acute distress  HEENT: normal  Neck: no JVD, carotid bruits, or masses Cardiac: RRR; no murmurs, rubs, or gallops,no edema  Respiratory:  clear to auscultation bilaterally, normal work of breathing GI: soft, nontender, nondistended, + BS MS: no deformity or atrophy  Skin: warm and dry  Neuro:  Strength and sensation are intact Psych: euthymic mood, full affect  EKG:  EKG is ordered today. The ekg ordered today shows sinus rhythm 54 bpm   Recent Labs: 08/25/2016: ALT 24; BUN 19; Creatinine, Ser 1.05; Hemoglobin 13.2; Platelets 187.0; Potassium 3.8; Sodium 142; TSH 1.48    Lipid Panel     Component Value  Date/Time   CHOL 127 08/25/2016 1005   TRIG 145.0 08/25/2016 1005   HDL 39.60 08/25/2016 1005   CHOLHDL 3 08/25/2016 1005   VLDL 29.0 08/25/2016 1005   LDLCALC 58 08/25/2016 1005     Wt Readings from Last 3 Encounters:  10/21/16 211 lb 6.4 oz (95.9 kg)  08/25/16 208 lb (94.3 kg)  07/19/16 211 lb (95.7 kg)     ASSESSMENT AND PLAN:  1.  Paroxysmal atrial fibrillation Doing well s/p ablation  No changes today Continue coumadin, chads2vasc score is at least 3  2. htn Stable No change required today  3. CAD S/p PCI  Stable Hopefully will be able to stop plavix when he follows up with Dr Percival Spanish   Follow-up:  Return to see Dr Percival Spanish as scheduled in 3 months I will see again in 6 months  Signed, [name], MD  10/21/2016 12:20 PM     [facility] [address] [place]  [address] [phone] (office) [phone] (fax)

## 2016-11-19 ENCOUNTER — Telehealth: Payer: Self-pay | Admitting: Family Medicine

## 2016-11-19 MED ORDER — AZITHROMYCIN 250 MG PO TABS: ORAL_TABLET | ORAL | 0 refills | Status: DC

## 2016-11-19 NOTE — Telephone Encounter (Signed)
He has symptoms of a sinus infection. Call in a Zpack

## 2016-11-19 NOTE — Telephone Encounter (Signed)
I spoke with pt and sent script e-scribe to [facility].

## 2016-11-22 ENCOUNTER — Ambulatory Visit: Payer: Medicare Other | Admitting: Family Medicine

## 2016-12-02 ENCOUNTER — Ambulatory Visit (INDEPENDENT_AMBULATORY_CARE_PROVIDER_SITE_OTHER): Payer: Medicare Other | Admitting: *Deleted

## 2016-12-02 DIAGNOSIS — I4891 Unspecified atrial fibrillation: Secondary | ICD-10-CM | POA: Diagnosis not present

## 2016-12-02 DIAGNOSIS — Z7901 Long term (current) use of anticoagulants: Secondary | ICD-10-CM | POA: Diagnosis not present

## 2016-12-02 DIAGNOSIS — Z5181 Encounter for therapeutic drug level monitoring: Secondary | ICD-10-CM

## 2016-12-02 LAB — POCT INR: INR: 2.6

## 2016-12-06 DIAGNOSIS — H401132 Primary open-angle glaucoma, bilateral, moderate stage: Secondary | ICD-10-CM | POA: Diagnosis not present

## 2016-12-09 ENCOUNTER — Other Ambulatory Visit: Payer: Self-pay | Admitting: Cardiology

## 2016-12-28 DIAGNOSIS — B079 Viral wart, unspecified: Secondary | ICD-10-CM | POA: Diagnosis not present

## 2016-12-28 DIAGNOSIS — L821 Other seborrheic keratosis: Secondary | ICD-10-CM | POA: Diagnosis not present

## 2016-12-28 DIAGNOSIS — Z85820 Personal history of malignant melanoma of skin: Secondary | ICD-10-CM | POA: Diagnosis not present

## 2016-12-28 DIAGNOSIS — L570 Actinic keratosis: Secondary | ICD-10-CM | POA: Diagnosis not present

## 2016-12-28 DIAGNOSIS — D225 Melanocytic nevi of trunk: Secondary | ICD-10-CM | POA: Diagnosis not present

## 2016-12-28 DIAGNOSIS — L814 Other melanin hyperpigmentation: Secondary | ICD-10-CM | POA: Diagnosis not present

## 2017-01-13 ENCOUNTER — Ambulatory Visit (INDEPENDENT_AMBULATORY_CARE_PROVIDER_SITE_OTHER): Payer: Medicare Other | Admitting: *Deleted

## 2017-01-13 DIAGNOSIS — Z5181 Encounter for therapeutic drug level monitoring: Secondary | ICD-10-CM | POA: Diagnosis not present

## 2017-01-13 DIAGNOSIS — I4891 Unspecified atrial fibrillation: Secondary | ICD-10-CM

## 2017-01-13 DIAGNOSIS — Z7901 Long term (current) use of anticoagulants: Secondary | ICD-10-CM

## 2017-01-13 LAB — POCT INR: INR: 2.6

## 2017-01-14 ENCOUNTER — Telehealth: Payer: Self-pay

## 2017-01-14 NOTE — Telephone Encounter (Signed)
Call to schedule AWV. Declined stating he does not feel this is needed. Educated on the apt but did declined

## 2017-01-16 NOTE — Progress Notes (Signed)
HPI  [name] [name] is a 83 y.o. male with a history of PAF s/p ablation (11/2014) on Coumadin, HTN, HLD and recently diagnosed CAD s/p DES to LAD (03/2016) who presents for follow up.  Since I last saw him he has had stiff neck.  He has pain and mild dizziness with turning his head.  He has had some paroxysms of atrial fib.  The patient denies any new symptoms such as chest discomfort, neck or arm discomfort. There has been no new shortness of breath, PND or orthopnea. There has been no presyncope or syncope.   Allergies  Allergen Reactions  . Sulfa Antibiotics Other (See Comments)    Severe peeling of the skin on the groin area  . Sulfamethoxazole Other (See Comments)    REACTION: rash    Current Outpatient Prescriptions  Medication Sig Dispense Refill  . acetaminophen (TYLENOL) 325 MG tablet Take 325 mg by mouth 2 (two) times daily.     Marland Kitchen atorvastatin (LIPITOR) 80 MG tablet Take 1 tablet (80 mg total) by mouth daily. 90 tablet 3  . cetirizine (ZYRTEC) 10 MG tablet Take 10 mg by mouth every morning.     . clopidogrel (PLAVIX) 75 MG tablet Take 1 tablet (75 mg total) by mouth daily with breakfast. 90 tablet 3  . latanoprost (XALATAN) 0.005 % ophthalmic solution Place 1 drop into both eyes at bedtime.     Marland Kitchen lisinopril (PRINIVIL,ZESTRIL) 10 MG tablet Take 10 mg by mouth at bedtime.     Marland Kitchen loratadine (CLARITIN) 10 MG tablet Take 10 mg by mouth at bedtime.     . metoprolol (LOPRESSOR) 50 MG tablet Take 0.5 tablets (25 mg total) by mouth 2 (two) times daily. 90 tablet 3  . mometasone (NASONEX) 50 MCG/ACT nasal spray Place 2 sprays into both nostrils daily as needed (allergies).     . Multiple Vitamins-Minerals (CENTRUM SILVER PO) Take 1 tablet by mouth every morning.     . nitroGLYCERIN (NITROSTAT) 0.4 MG SL tablet Place 1 tablet (0.4 mg total) under the tongue every 5 (five) minutes as needed for chest pain. 30 tablet 12  . pantoprazole (PROTONIX) 40 MG tablet Take 1 tablet (40 mg total) by  mouth daily. 90 tablet 3  . potassium chloride (KLOR-CON) 10 MEQ CR tablet Take 10 mEq by mouth every morning.     . tamsulosin (FLOMAX) 0.4 MG CAPS Take 0.4 mg by mouth 2 (two) times daily.     Marland Kitchen warfarin (COUMADIN) 5 MG tablet Take 1 tablet (5 mg total) by mouth daily. OR as directed by coumadin clinic 30 tablet 2   No current facility-administered medications for this visit.     Past Medical History:  Diagnosis Date  . Coronary artery disease    a. LHC on 04/06/16 which revealed 40% occl pRCA, 85% occl D1, 75% occl Ramus, 65% occl mLCx, 45% occl pLAD, 90% occl pLAD and nl LV function. s/p PCI/DES to prox LAD. Medical therapy for diag disease.    Marland Kitchen GERD (gastroesophageal reflux disease)   . Glaucoma   . Hemorrhoids    internal  . HTN (hypertension)   . Hx of colonic polyps    tubular adenoma   . Hyperlipidemia   . Kidney stone   . Melanoma of cheek ([place])   . Nephrolithiasis   . Pancolonic diverticulosis   . Paroxysmal atrial fibrillation (HCC)    a. s/p ablation 12/2014: on coumadin    Past Surgical History:  Procedure  Laterality Date  . ATRIAL FIBRILLATION ABLATION N/A 11/12/2014   Procedure: ATRIAL FIBRILLATION ABLATION;  Surgeon: [name], MD;  Location: [facility] CATH LAB;  Service: Cardiovascular;  Laterality: N/A;  . BLADDER SURGERY     ruptured blood vessel in the bladder 3 weeks S/P ureter stones removed  . CARDIAC CATHETERIZATION N/A 04/06/2016   Procedure: Left Heart Cath and Coronary Angiography;  Surgeon: [name], MD;  Location: [place] CV LAB;  Service: Cardiovascular;  Laterality: N/A;  . CARDIAC CATHETERIZATION N/A 04/06/2016   Procedure: Coronary Stent Intervention;  Surgeon: [name], MD;  Location: [place] CV LAB;  Service: Cardiovascular;  Laterality: N/A;  . CATARACT EXTRACTION W/ INTRAOCULAR LENS  IMPLANT, BILATERAL Bilateral   . COLONOSCOPY  06-29-12   per Dr. [name], adenomatous polyps, repeat in 3 yrs   . CORONARY ANGIOPLASTY WITH STENT  PLACEMENT  04/06/2016   "1 stent"  . CYSTOSCOPY/RETROGRADE/URETEROSCOPY/STONE EXTRACTION WITH BASKET Left    removed 2 stones from left ureter  . INGUINAL HERNIA REPAIR Right 04/1990   Dr. [name]  . LAPAROSCOPIC CHOLECYSTECTOMY  02/1990   Dr. [name]  . MELANOMA EXCISION Right    "@ cheek near ear"  . TEE WITHOUT CARDIOVERSION N/A 11/11/2014   Procedure: TRANSESOPHAGEAL ECHOCARDIOGRAM (TEE);  Surgeon: [name], MD;  Location: [facility] ENDOSCOPY;  Service: Cardiovascular;  Laterality: N/A;  needs INR before case  . VASECTOMY      ROS:     As stated in the HPI and negative for all other systems.   PHYSICAL EXAM BP 130/70 (BP Location: Right Arm, Patient Position: Sitting, Cuff Size: Normal)   Pulse 66   Ht 5\' 11"  (1.803 m)   Wt 215 lb (97.5 kg)   BMI 29.99 kg/m  GENERAL:  Well appearing NECK:  No jugular venous distention, waveform within normal limits, carotid upstroke brisk and symmetric, no bruits, no thyromegaly LUNGS:  Clear to auscultation bilaterally BACK:  No CVA tenderness CHEST:  Unremarkable HEART:  PMI not displaced or sustained,S1 and S2 within normal limits, no S3, no clicks, no rubs, no murmurs, irregular ABD:  Flat, positive bowel sounds normal in frequency in pitch, no bruits, no rebound, no guarding, no midline pulsatile mass, no hepatomegaly, no splenomegaly EXT:  2 plus pulses throughout, mild leg edema, no cyanosis no clubbing   Lab Results  Component Value Date   CHOL 127 08/25/2016   TRIG 145.0 08/25/2016   HDL 39.60 08/25/2016   LDLCALC 58 08/25/2016    ASSESSMENT AND PLAN  CAD - The patient has no new sypmtoms.  No further cardiovascular testing is indicated.  We will continue with aggressive risk reduction and meds as listed.  I will continue Plavix/warfarin for 12 months barring any complications.   He will stop the Plavix in May  ATRIAL FIBRILLATION -  [name] [name] has a CHA2DS2 - VASc score of 3 with a risk of stroke of 3.3%.  He will  continue warfarin.    HYPERTENSION -  I reviewed dietary and his blood pressure is well-controlled. He'll continue the meds as listed.    FATIGUE -   This went away with his angioplasty.

## 2017-01-17 ENCOUNTER — Ambulatory Visit (INDEPENDENT_AMBULATORY_CARE_PROVIDER_SITE_OTHER): Payer: Medicare Other | Admitting: Cardiology

## 2017-01-17 ENCOUNTER — Encounter: Payer: Self-pay | Admitting: Cardiology

## 2017-01-17 VITALS — BP 130/70 | HR 66 | Ht 71.0 in | Wt 215.0 lb

## 2017-01-17 DIAGNOSIS — I2510 Atherosclerotic heart disease of native coronary artery without angina pectoris: Secondary | ICD-10-CM

## 2017-01-17 DIAGNOSIS — I480 Paroxysmal atrial fibrillation: Secondary | ICD-10-CM

## 2017-01-17 NOTE — Patient Instructions (Addendum)
Medication Instructions:  STOP-Plavix in May  Labwork: None Ordered  Testing/Procedures: None Ordered  Follow-Up: Your physician wants you to follow-up in: 6 Months. You will receive a reminder letter in the mail two months in advance. If you don't receive a letter, please call our office to schedule the follow-up appointment.   Any Other Special Instructions Will Be Listed Below (If Applicable).   If you need a refill on your cardiac medications before your next appointment, please call your pharmacy.

## 2017-01-20 ENCOUNTER — Telehealth: Payer: Self-pay

## 2017-01-20 ENCOUNTER — Ambulatory Visit (INDEPENDENT_AMBULATORY_CARE_PROVIDER_SITE_OTHER): Payer: Medicare Other | Admitting: Family Medicine

## 2017-01-20 ENCOUNTER — Encounter: Payer: Self-pay | Admitting: Family Medicine

## 2017-01-20 VITALS — BP 165/83 | HR 53 | Temp 98.1°F | Ht 71.0 in | Wt 213.0 lb

## 2017-01-20 DIAGNOSIS — M542 Cervicalgia: Secondary | ICD-10-CM | POA: Diagnosis not present

## 2017-01-20 DIAGNOSIS — I2510 Atherosclerotic heart disease of native coronary artery without angina pectoris: Secondary | ICD-10-CM | POA: Diagnosis not present

## 2017-01-20 DIAGNOSIS — Z0000 Encounter for general adult medical examination without abnormal findings: Secondary | ICD-10-CM | POA: Diagnosis not present

## 2017-01-20 NOTE — Patient Instructions (Addendum)
WE NOW OFFER   [name] [name]'s FAST TRACK!!!  SAME DAY Appointments for ACUTE CARE  Such as: Sprains, Injuries, cuts, abrasions, rashes, muscle pain, joint pain, back pain Colds, flu, sore throats, headache, allergies, cough, fever  Ear pain, sinus and eye infections Abdominal pain, nausea, vomiting, diarrhea, upset stomach Animal/insect bites  3 Easy Ways to Schedule: Walk-In Scheduling Call in scheduling Mychart Sign-up: [email or web address]    Mr. Eckstein , Thank you for taking time to come for your Medicare Wellness Visit. I appreciate your ongoing commitment to your health goals. Please review the following plan we discussed and let me know if I can assist you in the future.   May have a hearing screen as needed   Manuela Schwartz will fup with Dr. [name] regarding colonoscopy     These are the goals we discussed: Goals    . patient          May go to the beach again.        This is a list of the screening recommended for you and due dates:  Health Maintenance  Topic Date Due  . Colon Cancer Screening  06/30/2015  . Tetanus Vaccine  08/25/2026  . Flu Shot  Completed  . Pneumonia vaccines  Completed    Fall Prevention in the Home Falls can cause injuries and can affect people from all age groups. There are many simple things that you can do to make your home safe and to help prevent falls. What can I do on the outside of my home?  Regularly repair the edges of walkways and driveways and fix any cracks.  Remove high doorway thresholds.  Trim any shrubbery on the main path into your home.  Use bright outdoor lighting.  Clear walkways of debris and clutter, including tools and rocks.  Regularly check that handrails are securely fastened and in good repair. Both sides of any steps should have handrails.  Install guardrails along the edges of any raised decks or porches.  Have leaves, snow, and ice cleared regularly.  Use sand or salt on walkways  during winter months.  In the garage, clean up any spills right away, including grease or oil spills. What can I do in the bathroom?  Use night lights.  Install grab bars by the toilet and in the tub and shower. Do not use towel bars as grab bars.  Use non-skid mats or decals on the floor of the tub or shower.  If you need to sit down while you are in the shower, use a plastic, non-slip stool.  Keep the floor dry. Immediately clean up any water that spills on the floor.  Remove soap buildup in the tub or shower on a regular basis.  Attach bath mats securely with double-sided non-slip rug tape.  Remove throw rugs and other tripping hazards from the floor. What can I do in the bedroom?  Use night lights.  Make sure that a bedside light is easy to reach.  Do not use oversized bedding that drapes onto the floor.  Have a firm chair that has side arms to use for getting dressed.  Remove throw rugs and other tripping hazards from the floor. What can I do in the kitchen?  Clean up any spills right away.  Avoid walking on wet floors.  Place frequently used items in easy-to-reach places.  If you need to reach for something above you, use a sturdy step stool that has a grab bar.  Keep electrical cables out  of the way.  Do not use floor polish or wax that makes floors slippery. If you have to use wax, make sure that it is non-skid floor wax.  Remove throw rugs and other tripping hazards from the floor. What can I do in the stairways?  Do not leave any items on the stairs.  Make sure that there are handrails on both sides of the stairs. Fix handrails that are broken or loose. Make sure that handrails are as long as the stairways.  Check any carpeting to make sure that it is firmly attached to the stairs. Fix any carpet that is loose or worn.  Avoid having throw rugs at the top or bottom of stairways, or secure the rugs with carpet tape to prevent them from moving.  Make  sure that you have a light switch at the top of the stairs and the bottom of the stairs. If you do not have them, have them installed. What are some other fall prevention tips?  Wear closed-toe shoes that fit well and support your feet. Wear shoes that have rubber soles or low heels.  When you use a stepladder, make sure that it is completely opened and that the sides are firmly locked. Have someone hold the ladder while you are using it. Do not climb a closed stepladder.  Add color or contrast paint or tape to grab bars and handrails in your home. Place contrasting color strips on the first and last steps.  Use mobility aids as needed, such as canes, walkers, scooters, and crutches.  Turn on lights if it is dark. Replace any light bulbs that burn out.  Set up furniture so that there are clear paths. Keep the furniture in the same spot.  Fix any uneven floor surfaces.  Choose a carpet design that does not hide the edge of steps of a stairway.  Be aware of any and all pets.  Review your medicines with your healthcare provider. Some medicines can cause dizziness or changes in blood pressure, which increase your risk of falling. Talk with your health care provider about other ways that you can decrease your risk of falls. This may include working with a physical therapist or trainer to improve your strength, balance, and endurance. This information is not intended to replace advice given to you by your health care provider. Make sure you discuss any questions you have with your health care provider. Document Released: 10/15/2002 Document Revised: 03/23/2016 Document Reviewed: 11/29/2014 Elsevier Interactive Patient Education  2017 [place] Maintenance, Male A healthy lifestyle and preventive care is important for your health and wellness. Ask your health care provider about what schedule of regular examinations is right for you. What should I know about weight and diet?   Eat a Healthy Diet  Eat plenty of vegetables, fruits, whole grains, low-fat dairy products, and lean protein.  Do not eat a lot of foods high in solid fats, added sugars, or salt. Maintain a Healthy Weight  Regular exercise can help you achieve or maintain a healthy weight. You should:  Do at least 150 minutes of exercise each week. The exercise should increase your heart rate and make you sweat (moderate-intensity exercise).  Do strength-training exercises at least twice a week. Watch Your Levels of Cholesterol and Blood Lipids  Have your blood tested for lipids and cholesterol every 5 years starting at 81 years of age. If you are at high risk for heart disease, you should start having your  blood tested when you are 81 years old. You may need to have your cholesterol levels checked more often if:  Your lipid or cholesterol levels are high.  You are older than 81 years of age.  You are at high risk for heart disease. What should I know about cancer screening? Many types of cancers can be detected early and may often be prevented. Lung Cancer  You should be screened every year for lung cancer if:  You are a current smoker who has smoked for at least 30 years.  You are a former smoker who has quit within the past 15 years.  Talk to your health care provider about your screening options, when you should start screening, and how often you should be screened. Colorectal Cancer  Routine colorectal cancer screening usually begins at 81 years of age and should be repeated every 5-10 years until you are 81 years old. You may need to be screened more often if early forms of precancerous polyps or small growths are found. Your health care provider may recommend screening at an earlier age if you have risk factors for colon cancer.  Your health care provider may recommend using home test kits to check for hidden blood in the stool.  A small camera at the end of a tube can be used to examine  your colon (sigmoidoscopy or colonoscopy). This checks for the earliest forms of colorectal cancer. Prostate and Testicular Cancer  Depending on your age and overall health, your health care provider may do certain tests to screen for prostate and testicular cancer.  Talk to your health care provider about any symptoms or concerns you have about testicular or prostate cancer. Skin Cancer  Check your skin from head to toe regularly.  Tell your health care provider about any new moles or changes in moles, especially if:  There is a change in a mole's size, shape, or color.  You have a mole that is larger than a pencil eraser.  Always use sunscreen. Apply sunscreen liberally and repeat throughout the day.  Protect yourself by wearing long sleeves, pants, a wide-brimmed hat, and sunglasses when outside. What should I know about heart disease, diabetes, and high blood pressure?  If you are 67-34 years of age, have your blood pressure checked every 3-5 years. If you are 79 years of age or older, have your blood pressure checked every year. You should have your blood pressure measured twice-once when you are at a hospital or clinic, and once when you are not at a hospital or clinic. Record the average of the two measurements. To check your blood pressure when you are not at a hospital or clinic, you can use:  An automated blood pressure machine at a pharmacy.  A home blood pressure monitor.  Talk to your health care provider about your target blood pressure.  If you are between 48-69 years old, ask your health care provider if you should take aspirin to prevent heart disease.  Have regular diabetes screenings by checking your fasting blood sugar level.  If you are at a normal weight and have a low risk for diabetes, have this test once every three years after the age of 15.  If you are overweight and have a high risk for diabetes, consider being tested at a younger age or more often.  A  one-time screening for abdominal aortic aneurysm (AAA) by ultrasound is recommended for men aged 59-75 years who are current or former smokers. What should  I know about preventing infection? Hepatitis B  If you have a higher risk for hepatitis B, you should be screened for this virus. Talk with your health care provider to find out if you are at risk for hepatitis B infection. Hepatitis C  Blood testing is recommended for:  Everyone born from [birth year] through 1963.  Anyone with known risk factors for hepatitis C. Sexually Transmitted Diseases (STDs)  You should be screened each year for STDs including gonorrhea and chlamydia if:  You are sexually active and are younger than 81 years of age.  You are older than 81 years of age and your health care provider tells you that you are at risk for this type of infection.  Your sexual activity has changed since you were last screened and you are at an increased risk for chlamydia or gonorrhea. Ask your health care provider if you are at risk.  Talk with your health care provider about whether you are at high risk of being infected with HIV. Your health care provider may recommend a prescription medicine to help prevent HIV infection. What else can I do?  Schedule regular health, dental, and eye exams.  Stay current with your vaccines (immunizations).  Do not use any tobacco products, such as cigarettes, chewing tobacco, and e-cigarettes. If you need help quitting, ask your health care provider.  Limit alcohol intake to no more than 2 drinks per day. One drink equals 12 ounces of beer, 5 ounces of wine, or 1 ounces of hard liquor.  Do not use street drugs.  Do not share needles.  Ask your health care provider for help if you need support or information about quitting drugs.  Tell your health care provider if you often feel depressed.  Tell your health care provider if you have ever been abused or do not feel safe at home. This information  is not intended to replace advice given to you by your health care provider. Make sure you discuss any questions you have with your health care provider. Document Released: 04/22/2008 Document Revised: 06/23/2016 Document Reviewed: 07/29/2015 Elsevier Interactive Patient Education  2017 [employer].

## 2017-01-20 NOTE — Progress Notes (Signed)
Pre visit review using our clinic review tool, if applicable. No additional management support is needed unless otherwise documented below in the visit note. 

## 2017-01-20 NOTE — Progress Notes (Signed)
   Subjective:    Patient ID: [name] [name], male    DOB: 1929-03-13, 83 y.o.   MRN: [id number]  HPI Here to check on neck pain that started 6 weeks ago. No hx of trauma. He has sharp pains in the left neck that sometimes radiate to the left shoulder, but not down the arm. No numbness or weakness. Heat and Tylenol help.    Review of Systems  Constitutional: Negative.   Respiratory: Negative.   Cardiovascular: Negative.   Musculoskeletal: Positive for neck pain and neck stiffness.  Neurological: Negative for headaches.       Objective:   Physical Exam  Constitutional: He appears well-developed and well-nourished. No distress.  Cardiovascular: Normal rate, regular rhythm, normal heart sounds and intact distal pulses.   Pulmonary/Chest: Effort normal and breath sounds normal.  Musculoskeletal:  He is mildly tender in the left neck, ROM is mildly reduced from pain. He is tender in the superior left trapezius muscle. The shoulder is normal          Assessment & Plan:  Neck pain, probably due to a pinched nerve. I offered to set him up for PT but he does not wish to pursue this at this time. He will continue with heat and stretches, and will recheck prn.  [name], MD u

## 2017-01-20 NOTE — Progress Notes (Addendum)
Subjective:   [name] [name] is a 83 y.o. male who presents for Medicare Annual/Subsequent preventive examination.  Medicare wellness following Dr. Sarajane Jews   Health is " pretty good"  BP 165; 83  Checked at home 126/82 and checks it every day  Ablation  83 yo in January; prior to this was going q 6 months to a year; atrial fib under good control  Father had HD  2017 stent last year because he didn't feel he was recovering  Did stress test / had to heart cath;  Dr. 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   Exercise Treadmill does 13 minutes 1/2 mile Active;  Yard work turned over to other people   Plans to stay in home; has 3 story home  Has a full basement; lots of stairs He and spouse are discussing changes  Will consider moving if one of them is w/c bound otherwise plans to age in place   Colonoscopy  Due 06/2012  Not need to repeat per Dr. [name] Will fup to see if this can be removed from o/d health screen   Cardiac Risk Factors include: advanced age (>26men, >49 women);dyslipidemia;male gender;family history of premature cardiovascular disease Advanced Directives have been completed    Objective:    Vitals: BP (!) 165/83   Pulse (!) 53   Temp 98.1 F (36.7 C)   Ht 5\' 11"  (1.803 m)   Wt 213 lb (96.6 kg)   BMI 29.71 kg/m   Body mass index is 29.71 kg/m. Checks at home and always runs lower than this  Tobacco History  Smoking Status  . Never Smoker  Smokeless Tobacco  . Never Used     Counseling given: Yes   Past Medical History:  Diagnosis Date  . Coronary artery disease    a. LHC on 04/06/16 which revealed 40% occl pRCA, 85% occl D1, 75% occl Ramus, 65% occl mLCx, 45% occl pLAD, 90% occl pLAD and nl LV function. s/p PCI/DES to prox LAD. Medical therapy for diag  disease.    Marland Kitchen GERD (gastroesophageal reflux disease)   . Glaucoma   . Hemorrhoids    internal  . HTN (hypertension)   . Hx of colonic polyps    tubular adenoma   . Hyperlipidemia   . Kidney stone   . Melanoma of cheek ([place])   . Nephrolithiasis   . Pancolonic diverticulosis   . Paroxysmal atrial fibrillation ([facility])    a. s/p ablation 12/2014: on coumadin   Past Surgical History:  Procedure Laterality Date  . ATRIAL FIBRILLATION ABLATION N/A 11/12/2014   Procedure: ATRIAL FIBRILLATION ABLATION;  Surgeon: [name], MD;  Location: [facility] CATH LAB;  Service: Cardiovascular;  Laterality: N/A;  . BLADDER SURGERY     ruptured blood vessel in the bladder 3 weeks S/P ureter stones removed  . CARDIAC CATHETERIZATION N/A 04/06/2016   Procedure: Left Heart Cath and Coronary Angiography;  Surgeon: [name], MD;  Location: [facility] CV LAB;  Service: Cardiovascular;  Laterality: N/A;  . CARDIAC CATHETERIZATION N/A 04/06/2016   Procedure: Coronary Stent Intervention;  Surgeon: [name], MD;  Location: [place] CV LAB;  Service: Cardiovascular;  Laterality: N/A;  . CATARACT EXTRACTION W/ INTRAOCULAR LENS  IMPLANT, BILATERAL Bilateral   . COLONOSCOPY  06-29-12   per Dr. [name], adenomatous polyps, repeat in 3 yrs   . CORONARY ANGIOPLASTY WITH STENT PLACEMENT  04/06/2016   "1 stent"  . CYSTOSCOPY/RETROGRADE/URETEROSCOPY/STONE EXTRACTION WITH BASKET Left    removed 2 stones from left ureter  . INGUINAL HERNIA REPAIR Right 04/1990   Dr. [name]  . LAPAROSCOPIC CHOLECYSTECTOMY  02/1990   Dr. [name]  . MELANOMA EXCISION Right    "@ cheek near ear"  . TEE WITHOUT CARDIOVERSION N/A 11/11/2014   Procedure: TRANSESOPHAGEAL ECHOCARDIOGRAM (TEE);  Surgeon: [name], MD;  Location: [facility] ENDOSCOPY;  Service: Cardiovascular;  Laterality: N/A;  needs INR before case  . VASECTOMY     Family History  Problem Relation Age of Onset  . Heart attack Father 55  . Heart disease Father   . Ovarian  cancer Mother    History  Sexual Activity  . Sexual activity: Not Currently    Outpatient Encounter Prescriptions as of 01/20/2017  Medication Sig  . acetaminophen (TYLENOL) 325 MG tablet Take 325 mg by mouth 2 (two) times daily.   Marland Kitchen atorvastatin (LIPITOR) 80 MG tablet Take 1 tablet (80 mg total) by mouth daily.  . cetirizine (ZYRTEC) 10 MG tablet Take 10 mg by mouth every morning.   . clopidogrel (PLAVIX) 75 MG tablet Take 1 tablet (75 mg total) by mouth daily with breakfast.  . latanoprost (XALATAN) 0.005 % ophthalmic solution Place 1 drop into both eyes at bedtime.   Marland Kitchen lisinopril (PRINIVIL,ZESTRIL) 10 MG tablet Take 10 mg by mouth at bedtime.   Marland Kitchen loratadine (CLARITIN) 10 MG tablet Take 10 mg by mouth at bedtime.   . metoprolol (LOPRESSOR) 50 MG tablet Take 0.5 tablets (25 mg total) by mouth 2 (two) times daily.  . mometasone (NASONEX) 50 MCG/ACT nasal spray Place 2 sprays into both nostrils daily as needed (allergies).   . Multiple Vitamins-Minerals (CENTRUM SILVER PO) Take 1 tablet by mouth every morning.   . pantoprazole (PROTONIX) 40 MG tablet Take 1 tablet (40 mg total) by mouth daily.  . potassium chloride (KLOR-CON) 10 MEQ CR tablet Take 10 mEq by mouth every morning.   . tamsulosin (FLOMAX) 0.4 MG CAPS Take 0.4 mg by mouth 2 (two) times daily.   Marland Kitchen warfarin (COUMADIN) 5 MG tablet Take 1 tablet (5 mg total) by mouth daily. OR as directed by coumadin clinic  . nitroGLYCERIN (NITROSTAT) 0.4 MG SL tablet Place 1 tablet (0.4 mg total) under the tongue every 5 (five) minutes as needed for chest pain. (Patient not taking: Reported on 01/20/2017)   No facility-administered encounter medications on file as of 01/20/2017.     Activities of Daily Living In your present state of health, do you have any difficulty performing the following activities: 01/20/2017 04/06/2016  Hearing? N -  Vision? N -  Difficulty concentrating or making decisions? (No Data) -  Walking or climbing stairs? N -    Dressing or bathing? N -  Doing errands, shopping? N N  Preparing Food and eating ? N -  Using the Toilet? N -  In the past six months, have you accidently leaked urine? N -  Do you have problems with loss of bowel control? N -  Managing your Medications? N -  Managing your Finances? N -  Housekeeping or managing your Housekeeping? N -  Some recent data might be hidden    Patient Care Team: [name], MD as PCP -  General   Assessment:     Exercise Activities and Dietary recommendations Current Exercise Habits: Home exercise routine, Time (Minutes): 30 (does get on the treadmill ), Intensity: Mild  Goals    . patient          May go to the beach again.       Fall Risk Fall Risk  01/20/2017 07/09/2016 08/23/2014  Falls in the past year? No No No   Depression Screen PHQ 2/9 Scores 01/20/2017 08/23/2014  PHQ - 2 Score 0 0    Cognitive Function MMSE - Mini Mental State Exam 01/20/2017  Not completed: (No Data)   Ad8 score is 0       Immunization History  Administered Date(s) Administered  . Influenza Split 08/19/2011, 08/21/2012  . Influenza Whole 08/14/2007, 08/01/2008, 08/11/2009, 08/17/2010  . Influenza, High Dose Seasonal PF 08/25/2015, 08/25/2016  . Influenza,inj,Quad PF,36+ Mos 08/22/2013, 08/23/2014  . Pneumococcal Conjugate-13 03/07/2014  . Pneumococcal Polysaccharide-23 11/08/1998  . Td 06/08/2001, 07/16/2008  . Tdap 08/25/2016  . Zoster 08/24/2011   Screening Tests Health Maintenance  Topic Date Due  . COLONOSCOPY  06/30/2015  . TETANUS/TDAP  08/25/2026  . INFLUENZA VACCINE  Completed  . PNA vac Low Risk Adult  Completed      Plan:      PCP Notes  Health Maintenance Takes good care of self as well as spouse  Will call Dr. [name] office remove colonoscopy from overdue screen.    Abnormal Screens hearing test 1000 hz in both ears but does not feel he has an issue but can have medicare paid screen as he chooses in the future    Referrals  none  Patient concerns; none  Nurse Concerns; none  Next PCP apt 10/19   During the course of the visit the patient was educated and counseled about the following appropriate screening and preventive services:   Vaccines to include Pneumoccal, Influenza, Hepatitis B, Td, Zostavax, HCV  Electrocardiogram  Cardiovascular Disease followed by cardiology   Colorectal cancer screening  Diabetes screening neg  Prostate Cancer Screening   Glaucoma screening under excellent control   Nutrition counseling cooks for spouse who is DM and eats well   Smoking cessation counseling n/a   Patient Instructions (the written plan) was given to the patient.    [email or web address], RN  01/20/2017  I have read this note and agree with its contents. [name], MD

## 2017-01-20 NOTE — Telephone Encounter (Signed)
Call to Floyd at  [facility] Asked about repeat colonoscopy, as the patient stated Dr. [name] did not feel another colonoscopy was necessary.  The file was checked and per email note in the chart, no further colonoscopy to be scheduled. Will update the overdue screen and note he is no longer a candidate for colonoscopy unless medical indicated in the future  Dr. Sarajane Jews , just [facility]

## 2017-02-24 ENCOUNTER — Ambulatory Visit (INDEPENDENT_AMBULATORY_CARE_PROVIDER_SITE_OTHER): Payer: Medicare Other | Admitting: *Deleted

## 2017-02-24 DIAGNOSIS — I4891 Unspecified atrial fibrillation: Secondary | ICD-10-CM

## 2017-02-24 DIAGNOSIS — Z7901 Long term (current) use of anticoagulants: Secondary | ICD-10-CM

## 2017-02-24 DIAGNOSIS — Z5181 Encounter for therapeutic drug level monitoring: Secondary | ICD-10-CM | POA: Diagnosis not present

## 2017-02-24 DIAGNOSIS — I2510 Atherosclerotic heart disease of native coronary artery without angina pectoris: Secondary | ICD-10-CM

## 2017-02-24 LAB — POCT INR: INR: 2.1

## 2017-03-07 DIAGNOSIS — H401132 Primary open-angle glaucoma, bilateral, moderate stage: Secondary | ICD-10-CM | POA: Diagnosis not present

## 2017-03-11 ENCOUNTER — Other Ambulatory Visit: Payer: Self-pay | Admitting: Cardiology

## 2017-04-07 ENCOUNTER — Ambulatory Visit (INDEPENDENT_AMBULATORY_CARE_PROVIDER_SITE_OTHER): Payer: Medicare Other | Admitting: *Deleted

## 2017-04-07 DIAGNOSIS — Z7901 Long term (current) use of anticoagulants: Secondary | ICD-10-CM

## 2017-04-07 DIAGNOSIS — I4891 Unspecified atrial fibrillation: Secondary | ICD-10-CM

## 2017-04-07 DIAGNOSIS — I2510 Atherosclerotic heart disease of native coronary artery without angina pectoris: Secondary | ICD-10-CM | POA: Diagnosis not present

## 2017-04-07 DIAGNOSIS — Z5181 Encounter for therapeutic drug level monitoring: Secondary | ICD-10-CM | POA: Diagnosis not present

## 2017-04-07 LAB — POCT INR: INR: 2.4

## 2017-05-19 ENCOUNTER — Ambulatory Visit (INDEPENDENT_AMBULATORY_CARE_PROVIDER_SITE_OTHER): Payer: Medicare Other | Admitting: Pharmacist

## 2017-05-19 DIAGNOSIS — I4891 Unspecified atrial fibrillation: Secondary | ICD-10-CM | POA: Diagnosis not present

## 2017-05-19 DIAGNOSIS — I2510 Atherosclerotic heart disease of native coronary artery without angina pectoris: Secondary | ICD-10-CM

## 2017-05-19 DIAGNOSIS — Z7901 Long term (current) use of anticoagulants: Secondary | ICD-10-CM

## 2017-05-19 DIAGNOSIS — Z5181 Encounter for therapeutic drug level monitoring: Secondary | ICD-10-CM | POA: Diagnosis not present

## 2017-05-19 LAB — POCT INR: INR: 2

## 2017-06-03 ENCOUNTER — Emergency Department (HOSPITAL_COMMUNITY): Payer: Medicare Other

## 2017-06-03 ENCOUNTER — Emergency Department (HOSPITAL_COMMUNITY)
Admission: EM | Admit: 2017-06-03 | Discharge: 2017-06-03 | Disposition: A | Discharge status: Home / Self Care | Payer: Medicare Other | Attending: Emergency Medicine | Admitting: Emergency Medicine

## 2017-06-03 ENCOUNTER — Encounter (HOSPITAL_COMMUNITY): Payer: Self-pay | Admitting: Emergency Medicine

## 2017-06-03 DIAGNOSIS — I10 Essential (primary) hypertension: Secondary | ICD-10-CM | POA: Insufficient documentation

## 2017-06-03 DIAGNOSIS — R079 Chest pain, unspecified: Secondary | ICD-10-CM | POA: Diagnosis not present

## 2017-06-03 DIAGNOSIS — Z79891 Long term (current) use of opiate analgesic: Secondary | ICD-10-CM | POA: Insufficient documentation

## 2017-06-03 DIAGNOSIS — Z7901 Long term (current) use of anticoagulants: Secondary | ICD-10-CM | POA: Diagnosis not present

## 2017-06-03 DIAGNOSIS — Z79899 Other long term (current) drug therapy: Secondary | ICD-10-CM | POA: Diagnosis not present

## 2017-06-03 LAB — CBC
HEMATOCRIT: 37.3 % — AB (ref 39.0–52.0)
Hemoglobin: 12.3 g/dL — ABNORMAL LOW (ref 13.0–17.0)
MCH: 31.5 pg (ref 26.0–34.0)
MCHC: 33 g/dL (ref 30.0–36.0)
MCV: 95.6 fL (ref 78.0–100.0)
Platelets: 185 10*3/uL (ref 150–400)
RBC: 3.9 MIL/uL — AB (ref 4.22–5.81)
RDW: 13.6 % (ref 11.5–15.5)
WBC: 8.7 10*3/uL (ref 4.0–10.5)

## 2017-06-03 LAB — BASIC METABOLIC PANEL
Anion gap: 6 (ref 5–15)
BUN: 14 mg/dL (ref 6–20)
CHLORIDE: 104 mmol/L (ref 101–111)
CO2: 30 mmol/L (ref 22–32)
Calcium: 9.3 mg/dL (ref 8.9–10.3)
Creatinine, Ser: 1.04 mg/dL (ref 0.61–1.24)
GFR calc non Af Amer: >60 mL/min (ref >60)
Glucose, Bld: 111 mg/dL — ABNORMAL HIGH (ref 65–99)
POTASSIUM: 4.3 mmol/L (ref 3.5–5.1)
SODIUM: 140 mmol/L (ref 135–145)

## 2017-06-03 LAB — PROTIME-INR
INR: 1.81
Prothrombin Time: 21.2 seconds — ABNORMAL HIGH (ref 11.4–15.2)

## 2017-06-03 LAB — CBG MONITORING, ED: Glucose-Capillary: 126 mg/dL — ABNORMAL HIGH (ref 65–99)

## 2017-06-03 LAB — TROPONIN I: Troponin I: <0.03 ng/mL (ref <0.03)

## 2017-06-03 NOTE — ED Notes (Signed)
Patient transported to X-ray 

## 2017-06-03 NOTE — ED Notes (Signed)
ED Provider at bedside. 

## 2017-06-03 NOTE — ED Triage Notes (Signed)
Cp started this am and tinglin g in left hand and is weak he states , sob

## 2017-06-03 NOTE — ED Provider Notes (Signed)
[facility] DEPT Provider Note   CSN: [id number] Arrival date & time: 06/03/17  1315     History   Chief Complaint No chief complaint on file.   HPI [name] [name] is a 83 y.o. male.  Pt presents to the ED today with cp.  Pt said that he walked down to his neighbor's house to put a piece of mail in their mailbox that accidentally got into his mailbox.  As he was walking home, the cp started.  Pt described it as a pressure.  He felt weak and sob.  The pain lasted about 1 hour.  He has no pain now.  Pt had a stent placed about 1 year ago.  He does have a.fib (s/p ablation 2016) and is on coumadin.  He was on plavix, but that was stopped in May.  He did not feel like he went into a.fib.  CHA2DS2/VAS Stroke Risk Points      4 >= 2 Points: High Risk  1 - 1.99 Points: Medium Risk  0 Points: Low Risk    This is the only CHA2DS2/VAS Stroke Risk Points available for the past  year.:  Change: N/A         Details    Note: External data might be a factor in metrics not marked with    Points Metrics   This score determines the patient's risk of having a stroke if the  patient has atrial fibrillation.       0 Has Congestive Heart Failure:  No   1 Has Vascular Disease:  Yes   1 Has Hypertension:  Yes   2 Age:  74   0 Has Diabetes:  No   0 Had Stroke:  No Had TIA:  No Had thromboembolism:  No   0 Male:  No             Past Medical History:  Diagnosis Date  . Coronary artery disease    a. LHC on 04/06/16 which revealed 40% occl pRCA, 85% occl D1, 75% occl Ramus, 65% occl mLCx, 45% occl pLAD, 90% occl pLAD and nl LV function. s/p PCI/DES to prox LAD. Medical therapy for diag disease.    Marland Kitchen GERD (gastroesophageal reflux disease)   . Glaucoma   . Hemorrhoids    internal  . HTN (hypertension)   . Hx of colonic polyps    tubular adenoma   . Hyperlipidemia   . Kidney stone   . Melanoma of cheek ([place])   . Nephrolithiasis   . Pancolonic diverticulosis   . Paroxysmal atrial  fibrillation ([facility])    a. s/p ablation 12/2014: on coumadin    Patient Active Problem List   Diagnosis Date Noted  . Coronary artery disease   . Abnormal stress test   . Paroxysmal atrial fibrillation ([place]) 11/12/2014  . Acute bronchitis 10/06/2014  . Encounter for therapeutic drug monitoring 12/03/2013  . RLL pneumonia ([place]) 11/19/2013  . Palpitations 03/29/2013  . PVC's (premature ventricular contractions) 01/26/2012  . Tachycardia-bradycardia syndrome ([place]) 01/26/2012  . Long term (current) use of anticoagulants 09/06/2011  . GERD 08/01/2007  . History of colonic polyps 08/01/2007  . Personal history of urinary calculi 08/01/2007  . BPH with urinary obstruction 08/01/2007  . Hyperlipidemia 07/04/2007  . Essential hypertension 07/04/2007  . ALLERGIC RHINITIS 07/04/2007    Past Surgical History:  Procedure Laterality Date  . ATRIAL FIBRILLATION ABLATION N/A 11/12/2014   Procedure: ATRIAL FIBRILLATION ABLATION;  Surgeon: [name], MD;  Location: [facility]  CATH LAB;  Service: Cardiovascular;  Laterality: N/A;  . BLADDER SURGERY     ruptured blood vessel in the bladder 3 weeks S/P ureter stones removed  . CARDIAC CATHETERIZATION N/A 04/06/2016   Procedure: Left Heart Cath and Coronary Angiography;  Surgeon: [name], MD;  Location: [facility] CV LAB;  Service: Cardiovascular;  Laterality: N/A;  . CARDIAC CATHETERIZATION N/A 04/06/2016   Procedure: Coronary Stent Intervention;  Surgeon: [name], MD;  Location: [facility] CV LAB;  Service: Cardiovascular;  Laterality: N/A;  . CATARACT EXTRACTION W/ INTRAOCULAR LENS  IMPLANT, BILATERAL Bilateral   . COLONOSCOPY  06-29-12   per Dr. [name], adenomatous polyps, repeat in 3 yrs   . CORONARY ANGIOPLASTY WITH STENT PLACEMENT  04/06/2016   "1 stent"  . CYSTOSCOPY/RETROGRADE/URETEROSCOPY/STONE EXTRACTION WITH BASKET Left    removed 2 stones from left ureter  . INGUINAL HERNIA REPAIR Right 04/1990   Dr. [name]  . LAPAROSCOPIC  CHOLECYSTECTOMY  02/1990   Dr. [name]  . MELANOMA EXCISION Right    "@ cheek near ear"  . TEE WITHOUT CARDIOVERSION N/A 11/11/2014   Procedure: TRANSESOPHAGEAL ECHOCARDIOGRAM (TEE);  Surgeon: [name], MD;  Location: [facility] ENDOSCOPY;  Service: Cardiovascular;  Laterality: N/A;  needs INR before case  . VASECTOMY         Home Medications    Prior to Admission medications   Medication Sig Start Date End Date Taking? Authorizing Provider  acetaminophen (TYLENOL) 325 MG tablet Take 325 mg by mouth 2 (two) times daily.    Yes [provider]  atorvastatin (LIPITOR) 80 MG tablet Take 1 tablet (80 mg total) by mouth daily. Patient taking differently: Take 80 mg by mouth at bedtime.  04/19/16  Yes Eileen Stanford, PA-C  cetirizine (ZYRTEC) 10 MG tablet Take 10 mg by mouth every morning.    Yes [provider]  latanoprost (XALATAN) 0.005 % ophthalmic solution Place 1 drop into both eyes at bedtime.    Yes [provider]  lisinopril (PRINIVIL,ZESTRIL) 10 MG tablet Take 10 mg by mouth at bedtime.    Yes [provider]  loratadine (CLARITIN) 10 MG tablet Take 10 mg by mouth at bedtime.    Yes [provider]  metoprolol (LOPRESSOR) 50 MG tablet Take 0.5 tablets (25 mg total) by mouth 2 (two) times daily. 02/04/15  Yes Sherran Needs, NP  mometasone (NASONEX) 50 MCG/ACT nasal spray Place 2 sprays into both nostrils daily as needed (allergies).    Yes [provider]  Multiple Vitamins-Minerals (CENTRUM SILVER PO) Take 1 tablet by mouth every morning.    Yes [provider]  nitroGLYCERIN (NITROSTAT) 0.4 MG SL tablet Place 1 tablet (0.4 mg total) under the tongue every 5 (five) minutes as needed for chest pain. 04/07/16  Yes Eileen Stanford, PA-C  pantoprazole (PROTONIX) 40 MG tablet Take 1 tablet (40 mg total) by mouth daily. Patient taking differently: Take 40 mg by mouth at bedtime.  04/19/16  Yes Eileen Stanford, PA-C    potassium chloride (KLOR-CON) 10 MEQ CR tablet Take 10 mEq by mouth every morning.    Yes [provider]  tamsulosin (FLOMAX) 0.4 MG CAPS Take 0.4 mg by mouth 2 (two) times daily.    Yes [provider]  warfarin (COUMADIN) 5 MG tablet TAKE 1 TABLET BY MOUTH DAILY OR AS DIRECTED BY COUMADIN CLINIC Patient taking differently: TAKE 5MG  BY MOUTH DAILY OR AS DIRECTED BY COUMADIN CLINIC 03/11/17  Yes [name], MD  clopidogrel (PLAVIX) 75 MG tablet Take 1 tablet (75 mg total) by mouth daily with breakfast. Patient not taking: Reported on 06/03/2017 04/19/16   Eileen Stanford, PA-C    Family History Family History  Problem Relation Age of Onset  . Heart attack Father 26  . Heart disease Father   . Ovarian cancer Mother     Social History Social History  Substance Use Topics  . Smoking status: Never Smoker  . Smokeless tobacco: Never Used  . Alcohol use No     Allergies   Sulfa antibiotics and Sulfamethoxazole   Review of Systems Review of Systems  Cardiovascular: Positive for chest pain.  All other systems reviewed and are negative.    Physical Exam Updated Vital Signs BP (!) 156/69   Pulse (!) 52   Temp 98.7 F (37.1 C) (Oral)   Resp (!) 21   Ht 5\' 11"  (1.803 m)   Wt 95.3 kg (210 lb)   SpO2 99%   BMI 29.29 kg/m   Physical Exam  Constitutional: He is oriented to person, place, and time. He appears well-developed and well-nourished.  HENT:  Head: Normocephalic and atraumatic.  Right Ear: External ear normal.  Left Ear: External ear normal.  Nose: Nose normal.  Mouth/Throat: Oropharynx is clear and moist.  Eyes: Pupils are equal, round, and reactive to light. Conjunctivae and EOM are normal.  Neck: Normal range of motion. Neck supple.  Cardiovascular: Normal rate, regular rhythm, normal heart sounds and intact distal pulses.   Pulmonary/Chest: Effort normal and breath sounds normal.  Abdominal: Soft. Bowel sounds are normal.   Musculoskeletal: Normal range of motion.  Neurological: He is alert and oriented to person, place, and time.  Skin: Skin is warm.  Psychiatric: He has a normal mood and affect. His behavior is normal. Judgment and thought content normal.  Nursing note and vitals reviewed.    ED Treatments / Results  Labs (all labs ordered are listed, but only abnormal results are displayed) Labs Reviewed  CBG MONITORING, ED - Abnormal; Notable for the following:       Result Value   Glucose-Capillary 126 (*)    All other components within normal limits  BASIC METABOLIC PANEL  CBC  TROPONIN I  PROTIME-INR    EKG  EKG Interpretation  Date/Time:  Friday June 03 2017 13:19:22 EDT Ventricular Rate:  67 PR Interval:  162 QRS Duration: 88 QT Interval:  398 QTC Calculation: 420 R Axis:   -2 Text Interpretation:  Normal sinus rhythm Anterior infarct , age undetermined Abnormal ECG Confirmed by Isla Pence [phone]) on 06/03/2017 2:54:36 PM       Radiology Dg Chest 2 View  Result Date: 06/03/2017 CLINICAL DATA:  Chest pain.  General weakness. EXAM: CHEST  2 VIEW COMPARISON:  03/09/2014. FINDINGS: The heart size and mediastinal contours are within normal limits. Both lungs are clear. The visualized skeletal structures are unremarkable. IMPRESSION: No active cardiopulmonary disease.  No change from priors. Electronically Signed   By: Staci Righter M.D.   On: 06/03/2017 15:24    Procedures Procedures (including critical care time)  Medications Ordered in ED Medications - No data to display   Initial Impression / Assessment and Plan / ED Course  I have reviewed the triage vital signs and the nursing notes.  Pertinent labs & imaging results that were available during my care of the patient were reviewed by me and considered in my medical decision making (see chart  for details).     Pt's labs are significantly delayed as they were not initially ordered in triage.  They drew blood, but the  lab said they never received the blood.  So, it had to be redrawn.  Due to this delay, he will be signed out to Dr. Tamera Punt.  I anticipate pt will need an observation admission.  He continues to be cp free.  Final Clinical Impressions(s) / ED Diagnoses   Final diagnoses:  Chest pain, unspecified type    New Prescriptions New Prescriptions   No medications on file     Isla Pence, MD 06/03/17 1649

## 2017-06-03 NOTE — ED Notes (Signed)
This RN called lab to follow-up about delayed processing in pt lab work. Per lab, they never received labs. EDP made aware; labs redrawn and sent.

## 2017-06-03 NOTE — ED Provider Notes (Signed)
Patient care was taken over from Dr. Gilford Raid. Patient presents with chest pain. He states the pain started today when he was walking to the mailbox. He's been pain-free throughout the ED course. He does have a history of coronary artery disease with stent placed in May 2017. His troponin is negative. His EKG doesn't show ischemic changes. However given his history Dr. Gilford Raid and I both felt the patient should stay overnight for further observation and cardiac evaluation. Patient is refusing this. I had a long conversation with the patient and the patient's wife and his son. I did explain to him the limitations of our testing and that he could still be having a heart attack or blockages without [place] being able to identify that. He is at risk for sudden cardiac death. He does not want to stay and acknowledges these risks. He will not even stay to have a second troponin. I did encourage him to stay to have a second troponin at least but he is refusing this. He is amenable to calling his cardiologist on Monday for follow-up on Monday. He does say that he will return if he has any return of symptoms over the weekend. Patient left AMA.   Malvin Johns, MD 06/03/17 L[place]line Hare

## 2017-06-10 ENCOUNTER — Ambulatory Visit (INDEPENDENT_AMBULATORY_CARE_PROVIDER_SITE_OTHER): Payer: Medicare Other

## 2017-06-10 ENCOUNTER — Encounter: Payer: Self-pay | Admitting: Cardiology

## 2017-06-10 ENCOUNTER — Ambulatory Visit (INDEPENDENT_AMBULATORY_CARE_PROVIDER_SITE_OTHER): Payer: Medicare Other | Admitting: Cardiology

## 2017-06-10 DIAGNOSIS — R55 Syncope and collapse: Secondary | ICD-10-CM | POA: Insufficient documentation

## 2017-06-10 DIAGNOSIS — Z7901 Long term (current) use of anticoagulants: Secondary | ICD-10-CM

## 2017-06-10 DIAGNOSIS — R079 Chest pain, unspecified: Secondary | ICD-10-CM

## 2017-06-10 DIAGNOSIS — I10 Essential (primary) hypertension: Secondary | ICD-10-CM | POA: Diagnosis not present

## 2017-06-10 DIAGNOSIS — I2510 Atherosclerotic heart disease of native coronary artery without angina pectoris: Secondary | ICD-10-CM

## 2017-06-10 MED ORDER — METOPROLOL TARTRATE 25 MG PO TABS: 12.5000 mg | ORAL_TABLET | Freq: Two times a day (BID) | ORAL | Status: DC

## 2017-06-10 NOTE — Assessment & Plan Note (Signed)
LHC on 04/06/16 which revealed 40% occl pRCA, 85% occl D1, 75% occl Ramus, 65% occl mLCx, 45% occl pLAD, 90% occl pLAD and nl LV function.  s/p PCI/DES to prox LAD. Medical therapy for diag disease.

## 2017-06-10 NOTE — Assessment & Plan Note (Signed)
Vague chest discomfort but he also had atypical symptoms when he had his DES in May 2017

## 2017-06-10 NOTE — Assessment & Plan Note (Signed)
Chronic Coumadin Rx- CHA2DS2-VASc=4

## 2017-06-10 NOTE — Progress Notes (Signed)
06/10/2017 [name] [name]   [birth year]  [id number]  Primary Physician [name], MD Primary Cardiologist: Dr Percival Spanish  HPI:  83 y/o male followed by Dr Percival Spanish with a history of CAD and PAF. He was seen in the ED 06/03/17 after he had an episode of weakness and vague sensation in his chest while walking to his mailbox. He had a non ischemic EKG and a negative Troponin. He declined to stay for The pt had an abnormal stress (POET) in May 2017 that was abnormal. He was admitted for cath and had high grade LAD and Dx1 disease and moderate CFX and RI disease. He underwent LAD PCI with DES. He never had chest pain, just vague fatigue. The pt also has PAF and had an AF RFA in Jan 2016 with very few recurrences. He tells me he can tell when he goes into into AF and he doesn't think this recent episode was from AF.Since then he still feels "off". He notes his HR is in the 40's at times at home. He has not had syncope or diaphoresis. He denies palpitations.    Current Outpatient Prescriptions  Medication Sig Dispense Refill  . acetaminophen (TYLENOL) 325 MG tablet Take 325 mg by mouth 2 (two) times daily.     Marland Kitchen atorvastatin (LIPITOR) 80 MG tablet Take 1 tablet (80 mg total) by mouth daily. (Patient taking differently: Take 80 mg by mouth at bedtime. ) 90 tablet 3  . cetirizine (ZYRTEC) 10 MG tablet Take 10 mg by mouth every morning.     . latanoprost (XALATAN) 0.005 % ophthalmic solution Place 1 drop into both eyes at bedtime.     Marland Kitchen lisinopril (PRINIVIL,ZESTRIL) 10 MG tablet Take 10 mg by mouth at bedtime.     Marland Kitchen loratadine (CLARITIN) 10 MG tablet Take 10 mg by mouth at bedtime.     . metoprolol tartrate (LOPRESSOR) 25 MG tablet Take 0.5 tablets (12.5 mg total) by mouth 2 (two) times daily.    . mometasone (NASONEX) 50 MCG/ACT nasal spray Place 2 sprays into both nostrils daily as needed (allergies).     . Multiple Vitamins-Minerals (CENTRUM SILVER PO) Take 1 tablet by mouth every morning.     .  nitroGLYCERIN (NITROSTAT) 0.4 MG SL tablet Place 1 tablet (0.4 mg total) under the tongue every 5 (five) minutes as needed for chest pain. 30 tablet 12  . pantoprazole (PROTONIX) 40 MG tablet Take 1 tablet (40 mg total) by mouth daily. (Patient taking differently: Take 40 mg by mouth at bedtime. ) 90 tablet 3  . potassium chloride (KLOR-CON) 10 MEQ CR tablet Take 10 mEq by mouth every morning.     . tamsulosin (FLOMAX) 0.4 MG CAPS Take 0.4 mg by mouth 2 (two) times daily.     Marland Kitchen warfarin (COUMADIN) 5 MG tablet TAKE 1 TABLET BY MOUTH DAILY OR AS DIRECTED BY COUMADIN CLINIC (Patient taking differently: TAKE 5MG  BY MOUTH DAILY OR AS DIRECTED BY COUMADIN CLINIC) 90 tablet 1   No current facility-administered medications for this visit.     Allergies  Allergen Reactions  . Sulfa Antibiotics Other (See Comments)    Severe peeling of the skin on the groin area  . Sulfamethoxazole Other (See Comments)    REACTION: rash    Past Medical History:  Diagnosis Date  . Coronary artery disease    a. LHC on 04/06/16 which revealed 40% occl pRCA, 85% occl D1, 75% occl Ramus, 65% occl mLCx, 45% occl pLAD,  90% occl pLAD and nl LV function. s/p PCI/DES to prox LAD. Medical therapy for diag disease.    Marland Kitchen GERD (gastroesophageal reflux disease)   . Glaucoma   . Hemorrhoids    internal  . HTN (hypertension)   . Hx of colonic polyps    tubular adenoma   . Hyperlipidemia   . Kidney stone   . Melanoma of cheek ([place])   . Nephrolithiasis   . Pancolonic diverticulosis   . Paroxysmal atrial fibrillation (H[facility])    a. s/p ablation 12/2014: on coumadin    Social History   Social History  . Marital status: Married    Spouse name: N/A  . Number of children: 2  . Years of education: N/A   Occupational History  . Retired    Social History Main Topics  . Smoking status: Never Smoker  . Smokeless tobacco: Never Used  . Alcohol use No  . Drug use: No  . Sexual activity: Not Currently   Other Topics Concern    . Not on file   Social History Narrative  . No narrative on file     Family History  Problem Relation Age of Onset  . Heart attack Father 75  . Heart disease Father   . Ovarian cancer Mother      Review of Systems: General: negative for chills, fever, night sweats or weight changes.  Cardiovascular: negative for chest pain, dyspnea on exertion, edema, orthopnea, palpitations, paroxysmal nocturnal dyspnea or shortness of breath Dermatological: negative for rash Respiratory: negative for cough or wheezing Urologic: negative for hematuria Abdominal: negative for nausea, vomiting, diarrhea, bright red blood per rectum, melena, or hematemesis Neurologic: negative for visual changes, syncope, or dizziness All other systems reviewed and are otherwise negative except as noted above.    Blood pressure 124/62, pulse (!) 58, height 5\' 11"  (1.803 m), weight 210 lb 12.8 oz (95.6 kg).  General appearance: alert, cooperative and no distress Neck: no carotid bruit and no JVD Lungs: clear to auscultation bilaterally Heart: regular rate and rhythm and slow rate Extremities: extremities normal, atraumatic, no cyanosis or edema Skin: Skin color, texture, turgor normal. No rashes or lesions Neurologic: Grossly normal  EKG 06/04/17- NSR, poor anterior RW  ASSESSMENT AND PLAN:   Near syncope Pt seen in the ED 7/27 with vague symptoms while walking to the mailbox. It sounds more like near syncope than angina but prior pre PCI symptoms were atypical.  He is bradycardic  CAD S/P percutaneous coronary angioplasty  LHC on 04/06/16 which revealed 40% occl pRCA, 85% occl D1, 75% occl Ramus, 65% occl mLCx, 45% occl pLAD, 90% occl pLAD and nl LV function.  s/p PCI/DES to prox LAD. Medical therapy for diag disease.   Chest pain with moderate risk for cardiac etiology Vague chest discomfort but he also had atypical symptoms when he had his DES in May 2017  Long term (current) use of  anticoagulants Chronic Coumadin Rx- CHA2DS2-VASc=4  Essential hypertension Controlled   PLAN  Decrease Lopressor to 12.5 mg BID, check 48 hr Holter and GXT Myoview. F/U pending these results.   Kerin Ransom PA-C 06/10/2017 9:06 AM

## 2017-06-10 NOTE — Patient Instructions (Signed)
Medication Instructions:  DECREASE METOPROLOL TO 12.5MG  TWICE DAILY If you need a refill on your cardiac medications before your next appointment, please call your pharmacy.  Testing/Procedures: Your physician has requested that you have a EXERCISE myoview. For further information please visit [email or web address]. Please follow instruction sheet, as given.  Your physician has recommended that you wear a 48 HOUR holter monitor(SCHEDULED FOR TODAY @ 430PM AT [place]). Holter monitors are medical devices that record the heart's electrical activity. Doctors most often use these monitors to diagnose arrhythmias. Arrhythmias are problems with the speed or rhythm of the heartbeat. The monitor is a small, portable device. You can wear one while you do your normal daily activities. This is usually used to diagnose what is causing palpitations/syncope (passing out).   Follow-Up: Your physician wants you to follow-up in: 3 MONTHS WITH DR HOCHREIN-WE WILL CALL YOU IF TESTING IS ABNORMAL.   Thank you for choosing [facility]!!

## 2017-06-10 NOTE — Assessment & Plan Note (Signed)
Controlled.  

## 2017-06-10 NOTE — Assessment & Plan Note (Addendum)
Pt seen in the ED 7/27 with vague symptoms while walking to the mailbox. It sounds more like near syncope than angina but prior pre PCI symptoms were atypical.  He is bradycardic

## 2017-06-13 DIAGNOSIS — H401132 Primary open-angle glaucoma, bilateral, moderate stage: Secondary | ICD-10-CM | POA: Diagnosis not present

## 2017-06-14 ENCOUNTER — Telehealth (HOSPITAL_COMMUNITY): Payer: Self-pay

## 2017-06-14 NOTE — Telephone Encounter (Signed)
Encounter complete. 

## 2017-06-16 ENCOUNTER — Ambulatory Visit (HOSPITAL_COMMUNITY)
Admission: RE | Admit: 2017-06-16 | Discharge: 2017-06-16 | Disposition: A | Discharge status: Home / Self Care | Payer: Medicare Other | Source: Ambulatory Visit | Attending: Internal Medicine | Admitting: Internal Medicine

## 2017-06-16 DIAGNOSIS — Z8249 Family history of ischemic heart disease and other diseases of the circulatory system: Secondary | ICD-10-CM | POA: Diagnosis not present

## 2017-06-16 DIAGNOSIS — I2510 Atherosclerotic heart disease of native coronary artery without angina pectoris: Secondary | ICD-10-CM | POA: Insufficient documentation

## 2017-06-16 DIAGNOSIS — R55 Syncope and collapse: Secondary | ICD-10-CM | POA: Diagnosis not present

## 2017-06-16 DIAGNOSIS — Z9861 Coronary angioplasty status: Secondary | ICD-10-CM | POA: Insufficient documentation

## 2017-06-16 DIAGNOSIS — R079 Chest pain, unspecified: Secondary | ICD-10-CM

## 2017-06-16 DIAGNOSIS — I480 Paroxysmal atrial fibrillation: Secondary | ICD-10-CM | POA: Insufficient documentation

## 2017-06-16 LAB — MYOCARDIAL PERFUSION IMAGING
Estimated workload: 7 METS
Exercise duration (min): 6 min
Exercise duration (sec): 20 s
LV dias vol: 108 mL (ref 62–150)
LV sys vol: 47 mL
MPHR: 135 {beats}/min
Peak HR: 126 {beats}/min
Percent HR: 93 %
RPE: 18
Rest HR: 57 {beats}/min
SDS: 2
SRS: 1
SSS: 3
TID: 1.08

## 2017-06-16 MED ORDER — TECHNETIUM TC 99M TETROFOSMIN IV KIT
10.5000 | PACK | Freq: Once | INTRAVENOUS | Status: AC | PRN
  Administered 2017-06-16: 10.5 via INTRAVENOUS
  Filled 2017-06-16: qty 11

## 2017-06-16 MED ORDER — TECHNETIUM TC 99M TETROFOSMIN IV KIT
30.5000 | PACK | Freq: Once | INTRAVENOUS | Status: AC | PRN
  Administered 2017-06-16: 30.5 via INTRAVENOUS
  Filled 2017-06-16: qty 31

## 2017-06-20 ENCOUNTER — Encounter: Payer: Self-pay | Admitting: Family Medicine

## 2017-06-20 ENCOUNTER — Ambulatory Visit (INDEPENDENT_AMBULATORY_CARE_PROVIDER_SITE_OTHER): Payer: Medicare Other | Admitting: Family Medicine

## 2017-06-20 VITALS — BP 132/72 | HR 48 | Temp 98.3°F | Ht 71.0 in | Wt 212.0 lb

## 2017-06-20 DIAGNOSIS — R42 Dizziness and giddiness: Secondary | ICD-10-CM | POA: Diagnosis not present

## 2017-06-20 DIAGNOSIS — I480 Paroxysmal atrial fibrillation: Secondary | ICD-10-CM | POA: Diagnosis not present

## 2017-06-20 DIAGNOSIS — I2510 Atherosclerotic heart disease of native coronary artery without angina pectoris: Secondary | ICD-10-CM | POA: Diagnosis not present

## 2017-06-20 DIAGNOSIS — I495 Sick sinus syndrome: Secondary | ICD-10-CM | POA: Diagnosis not present

## 2017-06-20 NOTE — Progress Notes (Signed)
   Subjective:    Patient ID: [name] [name], male    DOB: 08-20-1929, 83 y.o.   MRN: [id number]  HPI Here for continued bouts of dizziness. He recently had a cardiac workup including a 48 hour Holter and a Myoview perfusion scan, and these came out fine. He does have some occasional bouts of atrial fibrillation, but these have no bearing on his dizziness. He describes feeling off balance and the room sometimes moves a little when he stands up. He still takes Claritin daily.    Review of Systems  Constitutional: Negative.   HENT: Positive for postnasal drip. Negative for sinus pain, sinus pressure and sore throat.   Eyes: Negative.   Respiratory: Negative.   Cardiovascular: Negative.   Neurological: Positive for dizziness.       Objective:   Physical Exam  Constitutional: He is oriented to person, place, and time. He appears well-developed and well-nourished.  Neck: No thyromegaly present.  Cardiovascular: Normal rate, regular rhythm, normal heart sounds and intact distal pulses.   Pulmonary/Chest: Effort normal and breath sounds normal. No respiratory distress. He has no wheezes. He has no rales.  Lymphadenopathy:    He has no cervical adenopathy.  Neurological: He is alert and oriented to person, place, and time. No cranial nerve deficit. Coordination normal.          Assessment & Plan:  Dizziness, probably vertigo. Refer to ENT.  [name], MD

## 2017-06-20 NOTE — Patient Instructions (Signed)
WE NOW OFFER   [name] [name]'s FAST TRACK!!!  SAME DAY Appointments for ACUTE CARE  Such as: Sprains, Injuries, cuts, abrasions, rashes, muscle pain, joint pain, back pain Colds, flu, sore throats, headache, allergies, cough, fever  Ear pain, sinus and eye infections Abdominal pain, nausea, vomiting, diarrhea, upset stomach Animal/insect bites  3 Easy Ways to Schedule: Walk-In Scheduling Call in scheduling Mychart Sign-up: [email or web address]         

## 2017-06-24 DIAGNOSIS — R42 Dizziness and giddiness: Secondary | ICD-10-CM | POA: Diagnosis not present

## 2017-06-24 DIAGNOSIS — K219 Gastro-esophageal reflux disease without esophagitis: Secondary | ICD-10-CM | POA: Diagnosis not present

## 2017-06-24 DIAGNOSIS — H6121 Impacted cerumen, right ear: Secondary | ICD-10-CM | POA: Diagnosis not present

## 2017-06-29 DIAGNOSIS — D1801 Hemangioma of skin and subcutaneous tissue: Secondary | ICD-10-CM | POA: Diagnosis not present

## 2017-06-29 DIAGNOSIS — L821 Other seborrheic keratosis: Secondary | ICD-10-CM | POA: Diagnosis not present

## 2017-06-29 DIAGNOSIS — Z85820 Personal history of malignant melanoma of skin: Secondary | ICD-10-CM | POA: Diagnosis not present

## 2017-06-29 DIAGNOSIS — L570 Actinic keratosis: Secondary | ICD-10-CM | POA: Diagnosis not present

## 2017-06-29 DIAGNOSIS — D225 Melanocytic nevi of trunk: Secondary | ICD-10-CM | POA: Diagnosis not present

## 2017-06-29 DIAGNOSIS — L814 Other melanin hyperpigmentation: Secondary | ICD-10-CM | POA: Diagnosis not present

## 2017-06-30 ENCOUNTER — Ambulatory Visit (INDEPENDENT_AMBULATORY_CARE_PROVIDER_SITE_OTHER): Payer: Medicare Other

## 2017-06-30 DIAGNOSIS — Z5181 Encounter for therapeutic drug level monitoring: Secondary | ICD-10-CM | POA: Diagnosis not present

## 2017-06-30 DIAGNOSIS — Z7901 Long term (current) use of anticoagulants: Secondary | ICD-10-CM

## 2017-06-30 DIAGNOSIS — I4891 Unspecified atrial fibrillation: Secondary | ICD-10-CM

## 2017-06-30 DIAGNOSIS — I2510 Atherosclerotic heart disease of native coronary artery without angina pectoris: Secondary | ICD-10-CM

## 2017-06-30 LAB — POCT INR: INR: 2.6

## 2017-07-17 NOTE — Progress Notes (Signed)
Cardiology Office Note   Date:  07/19/2017   ID:  BRYSON GAVIA, DOB [birth year], MRN [id number]  PCP:  [name], MD  Cardiologist:   [name], MD    Chief Complaint  Patient presents with  . Dizziness      History of Present Illness: TAM DELISLE is a 84 y.o. male who presents for follow up of near syncope.  He was in the ED on late July.  He had no evidence of ischemia.  He was seen in follow up by Mr. Rosalyn Gess and he had a negative Lexiscan Myoview.  He had a Holter in August without acute symptomatic arrhythmia.  He did note that that at the time of his ER visit his heart rate was in the low 50s for high 40s and his, to the mid 50s. He doesn't have any of the dizziness now. He's not had any presyncope or syncope. He denies any chest pressure, neck or arm discomfort. He has no weight gain or edema. He's walking on his treadmill.     Past Medical History:  Diagnosis Date  . Coronary artery disease    a. LHC on 04/06/16 which revealed 40% occl pRCA, 85% occl D1, 75% occl Ramus, 65% occl mLCx, 45% occl pLAD, 90% occl pLAD and nl LV function. s/p PCI/DES to prox LAD. Medical therapy for diag disease.    Marland Kitchen GERD (gastroesophageal reflux disease)   . Glaucoma   . Hemorrhoids    internal  . HTN (hypertension)   . Hx of colonic polyps    tubular adenoma   . Hyperlipidemia   . Kidney stone   . Melanoma of cheek ([place])   . Nephrolithiasis   . Pancolonic diverticulosis   . Paroxysmal atrial fibrillation (HCC)    a. s/p ablation 12/2014: on coumadin    Past Surgical History:  Procedure Laterality Date  . ATRIAL FIBRILLATION ABLATION N/A 11/12/2014   Procedure: ATRIAL FIBRILLATION ABLATION;  Surgeon: [name], MD;  Location: [facility] CATH LAB;  Service: Cardiovascular;  Laterality: N/A;  . BLADDER SURGERY     ruptured blood vessel in the bladder 3 weeks S/P ureter stones removed  . CARDIAC CATHETERIZATION N/A 04/06/2016   Procedure: Left Heart Cath and Coronary  Angiography;  Surgeon: [name], MD;  Location: [place] CV LAB;  Service: Cardiovascular;  Laterality: N/A;  . CARDIAC CATHETERIZATION N/A 04/06/2016   Procedure: Coronary Stent Intervention;  Surgeon: [name], MD;  Location: [place] CV LAB;  Service: Cardiovascular;  Laterality: N/A;  . CATARACT EXTRACTION W/ INTRAOCULAR LENS  IMPLANT, BILATERAL Bilateral   . COLONOSCOPY  06-29-12   per Dr. [name], adenomatous polyps, repeat in 3 yrs   . CORONARY ANGIOPLASTY WITH STENT PLACEMENT  04/06/2016   "1 stent"  . CYSTOSCOPY/RETROGRADE/URETEROSCOPY/STONE EXTRACTION WITH BASKET Left    removed 2 stones from left ureter  . INGUINAL HERNIA REPAIR Right 04/1990   Dr. [name]  . LAPAROSCOPIC CHOLECYSTECTOMY  02/1990   Dr. [name]  . MELANOMA EXCISION Right    "@ cheek near ear"  . TEE WITHOUT CARDIOVERSION N/A 11/11/2014   Procedure: TRANSESOPHAGEAL ECHOCARDIOGRAM (TEE);  Surgeon: [name], MD;  Location: [facility] ENDOSCOPY;  Service: Cardiovascular;  Laterality: N/A;  needs INR before case  . VASECTOMY       Current Outpatient Prescriptions  Medication Sig Dispense Refill  . acetaminophen (TYLENOL) 325 MG tablet Take 325 mg by mouth 2 (two) times daily.     Marland Kitchen  atorvastatin (LIPITOR) 80 MG tablet Take 1 tablet (80 mg total) by mouth daily. (Patient taking differently: Take 80 mg by mouth at bedtime. ) 90 tablet 3  . cetirizine (ZYRTEC) 10 MG tablet Take 10 mg by mouth every morning.     . latanoprost (XALATAN) 0.005 % ophthalmic solution Place 1 drop into both eyes at bedtime.     Marland Kitchen lisinopril (PRINIVIL,ZESTRIL) 10 MG tablet Take 10 mg by mouth at bedtime.     Marland Kitchen loratadine (CLARITIN) 10 MG tablet Take 10 mg by mouth at bedtime.     . metoprolol tartrate (LOPRESSOR) 25 MG tablet Take 0.5 tablets (12.5 mg total) by mouth 2 (two) times daily.    . mometasone (NASONEX) 50 MCG/ACT nasal spray Place 2 sprays into both nostrils daily as needed (allergies).     . Multiple Vitamins-Minerals  (CENTRUM SILVER PO) Take 1 tablet by mouth every morning.     . nitroGLYCERIN (NITROSTAT) 0.4 MG SL tablet Place 1 tablet (0.4 mg total) under the tongue every 5 (five) minutes as needed for chest pain. 30 tablet 12  . pantoprazole (PROTONIX) 40 MG tablet Take 1 tablet (40 mg total) by mouth daily. (Patient taking differently: Take 40 mg by mouth at bedtime. ) 90 tablet 3  . potassium chloride (KLOR-CON) 10 MEQ CR tablet Take 10 mEq by mouth every morning.     . tamsulosin (FLOMAX) 0.4 MG CAPS Take 0.4 mg by mouth 2 (two) times daily.     Marland Kitchen warfarin (COUMADIN) 5 MG tablet TAKE 1 TABLET BY MOUTH DAILY OR AS DIRECTED BY COUMADIN CLINIC (Patient taking differently: TAKE 5MG  BY MOUTH DAILY OR AS DIRECTED BY COUMADIN CLINIC) 90 tablet 1   No current facility-administered medications for this visit.     Allergies:   Sulfa antibiotics and Sulfamethoxazole     ROS:  Please see the history of present illness.   Otherwise, review of systems are positive for none.   All other systems are reviewed and negative.    PHYSICAL EXAM: VS:  BP 122/60   Pulse 64   Ht 5\' 11"  (1.803 m)   Wt 208 lb (94.3 kg)   BMI 29.01 kg/m  , BMI Body mass index is 29.01 kg/m. GENERAL:  Well appearing NECK:  No jugular venous distention, waveform within normal limits, carotid upstroke brisk and symmetric, no bruits, no thyromegaly LUNGS:  Clear to auscultation bilaterally BACK:  No CVA tenderness CHEST:  Unremarkable HEART:  PMI not displaced or sustained,S1 and S2 within normal limits, no S3, no S4, no clicks, no rubs, no murmurs ABD:  Flat, positive bowel sounds normal in frequency in pitch, no bruits, no rebound, no guarding, no midline pulsatile mass, no hepatomegaly, no splenomegaly EXT:  2 plus pulses throughout, no edema, no cyanosis no clubbing    EKG:  EKG is not ordered today.    Recent Labs: 08/25/2016: ALT 24; TSH 1.48 06/03/2017: BUN 14; Creatinine, Ser 1.04; Hemoglobin 12.3; Platelets 185; Potassium  4.3; Sodium 140    Lipid Panel    Component Value Date/Time   CHOL 127 08/25/2016 1005   TRIG 145.0 08/25/2016 1005   HDL 39.60 08/25/2016 1005   CHOLHDL 3 08/25/2016 1005   VLDL 29.0 08/25/2016 1005   LDLCALC 58 08/25/2016 1005      Wt Readings from Last 3 Encounters:  07/18/17 208 lb (94.3 kg)  06/20/17 212 lb (96.2 kg)  06/16/17 210 lb (95.3 kg)      Other studies Reviewed:  Additional studies/ records that were reviewed today include: none. Review of the above records demonstrates:  Please see elsewhere in the note.     ASSESSMENT AND PLAN:  NEAR SYNCOPE:    He has had no further episodes. Should he have any future symptoms I would need to apply an event monitor.  CAD: There is no evidence of ischemia. No further workup is indicated.  ATRIAL FIB:  He's had no symptomatic recurrence.  Mr. DAMARIEN NYMAN has a CHA2DS2 - VASc score of 3.   He tolerates anticoagulation.    HTN:  The blood pressure is at target. No change in medications is indicated. We will continue with therapeutic lifestyle changes (TLC).   Current medicines are reviewed at length with the patient today.  The patient does not have concerns regarding medicines.  The following changes have been made:  no change  Labs/ tests ordered today include: None No orders of the defined types were placed in this encounter.    Disposition:   FU with me in six months.      Signed, [name], MD  07/19/2017 6:12 PM    [facility]

## 2017-07-18 ENCOUNTER — Ambulatory Visit (INDEPENDENT_AMBULATORY_CARE_PROVIDER_SITE_OTHER): Payer: Medicare Other | Admitting: Cardiology

## 2017-07-18 ENCOUNTER — Encounter: Payer: Self-pay | Admitting: Cardiology

## 2017-07-18 VITALS — BP 122/60 | HR 64 | Ht 71.0 in | Wt 208.0 lb

## 2017-07-18 DIAGNOSIS — R55 Syncope and collapse: Secondary | ICD-10-CM

## 2017-07-18 DIAGNOSIS — I481 Persistent atrial fibrillation: Secondary | ICD-10-CM | POA: Diagnosis not present

## 2017-07-18 DIAGNOSIS — I2510 Atherosclerotic heart disease of native coronary artery without angina pectoris: Secondary | ICD-10-CM

## 2017-07-18 DIAGNOSIS — I4819 Other persistent atrial fibrillation: Secondary | ICD-10-CM

## 2017-07-18 NOTE — Patient Instructions (Signed)
Your physician wants you to follow-up in: 6 months with Dr. Hochrein.  You will receive a reminder letter in the mail two months in advance. If you don't receive a letter, please call our office to schedule the follow-up appointment.  

## 2017-07-19 ENCOUNTER — Encounter: Payer: Self-pay | Admitting: Cardiology

## 2017-07-28 ENCOUNTER — Encounter: Payer: Self-pay | Admitting: Family Medicine

## 2017-07-29 ENCOUNTER — Ambulatory Visit (INDEPENDENT_AMBULATORY_CARE_PROVIDER_SITE_OTHER): Payer: Medicare Other | Admitting: Family Medicine

## 2017-07-29 DIAGNOSIS — Z23 Encounter for immunization: Secondary | ICD-10-CM

## 2017-07-31 ENCOUNTER — Encounter (HOSPITAL_COMMUNITY): Payer: Self-pay | Admitting: *Deleted

## 2017-07-31 ENCOUNTER — Emergency Department (HOSPITAL_COMMUNITY)
Admission: EM | Admit: 2017-07-31 | Discharge: 2017-07-31 | Disposition: A | Discharge status: Home / Self Care | Payer: Medicare Other | Attending: Emergency Medicine | Admitting: Emergency Medicine

## 2017-07-31 ENCOUNTER — Emergency Department (HOSPITAL_COMMUNITY): Payer: Medicare Other

## 2017-07-31 DIAGNOSIS — R001 Bradycardia, unspecified: Secondary | ICD-10-CM | POA: Diagnosis not present

## 2017-07-31 DIAGNOSIS — I2510 Atherosclerotic heart disease of native coronary artery without angina pectoris: Secondary | ICD-10-CM | POA: Insufficient documentation

## 2017-07-31 DIAGNOSIS — Z79899 Other long term (current) drug therapy: Secondary | ICD-10-CM | POA: Insufficient documentation

## 2017-07-31 DIAGNOSIS — R5383 Other fatigue: Secondary | ICD-10-CM | POA: Diagnosis not present

## 2017-07-31 DIAGNOSIS — I480 Paroxysmal atrial fibrillation: Secondary | ICD-10-CM | POA: Diagnosis not present

## 2017-07-31 DIAGNOSIS — I10 Essential (primary) hypertension: Secondary | ICD-10-CM | POA: Insufficient documentation

## 2017-07-31 DIAGNOSIS — Z7901 Long term (current) use of anticoagulants: Secondary | ICD-10-CM | POA: Diagnosis not present

## 2017-07-31 DIAGNOSIS — I4891 Unspecified atrial fibrillation: Secondary | ICD-10-CM | POA: Diagnosis not present

## 2017-07-31 DIAGNOSIS — R002 Palpitations: Secondary | ICD-10-CM | POA: Diagnosis present

## 2017-07-31 NOTE — ED Triage Notes (Signed)
Pt states he went into afib at 2pm yesterday and states this is the longest time he has been in it.  Pt is on metoprolol and coumadin

## 2017-07-31 NOTE — ED Provider Notes (Signed)
[place] DEPT Provider Note   CSN: [id number] Arrival date & time: 07/31/17  3154     History   Chief Complaint Chief Complaint  Patient presents with  . Atrial Fibrillation    HPI [name] [name] is a 83 y.o. male.  HPI Patient's age 83 year old male with history of atrial fibrillation. States he ran into a trip for ablation yesterday around 2 PM after doing housework. Has had some mild fatigue but denies any chest pain or shortness of breath. Converted back to normal sinus rhythm in triage. Patient states he is feeling much better. He is asking to be discharged home. No recent illnesses. States he saw his cardiologist a couple weeks ago with blood work performed at that point.  Past Medical History:  Diagnosis Date  . Coronary artery disease    a. LHC on 04/06/16 which revealed 40% occl pRCA, 85% occl D1, 75% occl Ramus, 65% occl mLCx, 45% occl pLAD, 90% occl pLAD and nl LV function. s/p PCI/DES to prox LAD. Medical therapy for diag disease.    Marland Kitchen GERD (gastroesophageal reflux disease)   . Glaucoma   . Hemorrhoids    internal  . HTN (hypertension)   . Hx of colonic polyps    tubular adenoma   . Hyperlipidemia   . Kidney stone   . Melanoma of cheek ([place])   . Nephrolithiasis   . Pancolonic diverticulosis   . Paroxysmal atrial fibrillation (HCC)    a. s/p ablation 12/2014: on coumadin    Patient Active Problem List   Diagnosis Date Noted  . Near syncope 06/10/2017  . Chest pain with moderate risk for cardiac etiology 06/10/2017  . CAD S/P percutaneous coronary angioplasty   . Abnormal stress test   . Paroxysmal atrial fibrillation ([place]) 11/12/2014  . Acute bronchitis 10/06/2014  . Encounter for therapeutic drug monitoring 12/03/2013  . RLL pneumonia ([place]) 11/19/2013  . Palpitations 03/29/2013  . PVC's (premature ventricular contractions) 01/26/2012  . Tachycardia-bradycardia syndrome ([place]) 01/26/2012  . Long term (current) use of anticoagulants 09/06/2011  .  GERD 08/01/2007  . History of colonic polyps 08/01/2007  . Personal history of urinary calculi 08/01/2007  . BPH with urinary obstruction 08/01/2007  . Hyperlipidemia 07/04/2007  . Essential hypertension 07/04/2007  . ALLERGIC RHINITIS 07/04/2007    Past Surgical History:  Procedure Laterality Date  . ATRIAL FIBRILLATION ABLATION N/A 11/12/2014   Procedure: ATRIAL FIBRILLATION ABLATION;  Surgeon: [name], MD;  Location: [facility] CATH LAB;  Service: Cardiovascular;  Laterality: N/A;  . BLADDER SURGERY     ruptured blood vessel in the bladder 3 weeks S/P ureter stones removed  . CARDIAC CATHETERIZATION N/A 04/06/2016   Procedure: Left Heart Cath and Coronary Angiography;  Surgeon: [name], MD;  Location: [place] CV LAB;  Service: Cardiovascular;  Laterality: N/A;  . CARDIAC CATHETERIZATION N/A 04/06/2016   Procedure: Coronary Stent Intervention;  Surgeon: [name], MD;  Location: [place] CV LAB;  Service: Cardiovascular;  Laterality: N/A;  . CATARACT EXTRACTION W/ INTRAOCULAR LENS  IMPLANT, BILATERAL Bilateral   . COLONOSCOPY  06-29-12   per Dr. [name], adenomatous polyps, repeat in 3 yrs   . CORONARY ANGIOPLASTY WITH STENT PLACEMENT  04/06/2016   "1 stent"  . CYSTOSCOPY/RETROGRADE/URETEROSCOPY/STONE EXTRACTION WITH BASKET Left    removed 2 stones from left ureter  . INGUINAL HERNIA REPAIR Right 04/1990   Dr. [name]  . LAPAROSCOPIC CHOLECYSTECTOMY  02/1990   Dr. [name]  . MELANOMA EXCISION Right    "@  cheek near ear"  . TEE WITHOUT CARDIOVERSION N/A 11/11/2014   Procedure: TRANSESOPHAGEAL ECHOCARDIOGRAM (TEE);  Surgeon: [name], MD;  Location: [facility] ENDOSCOPY;  Service: Cardiovascular;  Laterality: N/A;  needs INR before case  . VASECTOMY         Home Medications    Prior to Admission medications   Medication Sig Start Date End Date Taking? Authorizing Provider  acetaminophen (TYLENOL) 325 MG tablet Take 325 mg by mouth 2 (two) times daily.     [provider]  atorvastatin (LIPITOR) 80 MG tablet Take 1 tablet (80 mg total) by mouth daily. Patient taking differently: Take 80 mg by mouth at bedtime.  04/19/16   Eileen Stanford, PA-C  cetirizine (ZYRTEC) 10 MG tablet Take 10 mg by mouth every morning.     [provider]  latanoprost (XALATAN) 0.005 % ophthalmic solution Place 1 drop into both eyes at bedtime.     [provider]  lisinopril (PRINIVIL,ZESTRIL) 10 MG tablet Take 10 mg by mouth at bedtime.     [provider]  loratadine (CLARITIN) 10 MG tablet Take 10 mg by mouth at bedtime.     [provider]  metoprolol tartrate (LOPRESSOR) 25 MG tablet Take 0.5 tablets (12.5 mg total) by mouth 2 (two) times daily. 06/10/17   Erlene Quan, PA-C  mometasone (NASONEX) 50 MCG/ACT nasal spray Place 2 sprays into both nostrils daily as needed (allergies).     [provider]  Multiple Vitamins-Minerals (CENTRUM SILVER PO) Take 1 tablet by mouth every morning.     [provider]  nitroGLYCERIN (NITROSTAT) 0.4 MG SL tablet Place 1 tablet (0.4 mg total) under the tongue every 5 (five) minutes as needed for chest pain. 04/07/16   Eileen Stanford, PA-C  pantoprazole (PROTONIX) 40 MG tablet Take 1 tablet (40 mg total) by mouth daily. Patient taking differently: Take 40 mg by mouth at bedtime.  04/19/16   Eileen Stanford, PA-C  potassium chloride (KLOR-CON) 10 MEQ CR tablet Take 10 mEq by mouth every morning.     [provider]  tamsulosin (FLOMAX) 0.4 MG CAPS Take 0.4 mg by mouth 2 (two) times daily.     [provider]  warfarin (COUMADIN) 5 MG tablet TAKE 1 TABLET BY MOUTH DAILY OR AS DIRECTED BY COUMADIN CLINIC Patient taking differently: TAKE 5MG  BY MOUTH DAILY OR AS DIRECTED BY COUMADIN CLINIC 03/11/17   [name], MD    Family History Family History  Problem Relation Age of Onset  . Heart attack Father 61  . Heart disease Father   . Ovarian cancer  Mother     Social History Social History  Substance Use Topics  . Smoking status: Never Smoker  . Smokeless tobacco: Never Used  . Alcohol use No     Allergies   Sulfa antibiotics and Sulfamethoxazole   Review of Systems Review of Systems  Constitutional: Positive for fatigue. Negative for chills and fever.  Eyes: Negative for visual disturbance.  Respiratory: Negative for cough and shortness of breath.   Cardiovascular: Positive for palpitations. Negative for chest pain and leg swelling.  Gastrointestinal: Negative for abdominal pain, constipation, diarrhea, nausea and vomiting.  Genitourinary: Negative for dysuria, flank pain and frequency.  Musculoskeletal: Negative for back pain, joint swelling, myalgias, neck pain and neck stiffness.  Skin: Negative for rash and wound.  Neurological: Negative for dizziness, syncope, weakness, light-headedness, numbness and headaches.  All other systems reviewed and are negative.  Physical Exam Updated Vital Signs BP 137/67   Pulse 87   Temp 98.4 F (36.9 C) (Oral)   Resp 13   Ht 5\' 11"  (1.803 m)   Wt 94.3 kg (208 lb)   SpO2 99%   BMI 29.01 kg/m   Physical Exam  Constitutional: He is oriented to person, place, and time. He appears well-developed and well-nourished. No distress.  HENT:  Head: Normocephalic and atraumatic.  Mouth/Throat: Oropharynx is clear and moist. No oropharyngeal exudate.  Eyes: Pupils are equal, round, and reactive to light. EOM are normal.  Neck: Normal range of motion. Neck supple. No JVD present.  Cardiovascular: Regular rhythm.   Bradycardia  Pulmonary/Chest: Effort normal and breath sounds normal.  Abdominal: Soft. Bowel sounds are normal. There is no tenderness. There is no rebound and no guarding.  Musculoskeletal: Normal range of motion. He exhibits no edema or tenderness.  No lower extremity swelling, asymmetry or tenderness.  Neurological: He is alert and oriented to person, place, and  time.  5/5 motor in all extremity. Sensation fully intact.  Skin: Skin is warm and dry. Capillary refill takes less than 2 seconds. No rash noted. No erythema.  Psychiatric: He has a normal mood and affect. His behavior is normal.  Nursing note and vitals reviewed.    ED Treatments / Results  Labs (all labs ordered are listed, but only abnormal results are displayed) Labs Reviewed  BASIC METABOLIC PANEL  CBC  PROTIME-INR  I-STAT TROPONIN, ED    EKG  EKG Interpretation  Date/Time:  Sunday July 31 2017 10:52:03 EDT Ventricular Rate:  54 PR Interval:  160 QRS Duration: 76 QT Interval:  420 QTC Calculation: 398 R Axis:   20 Text Interpretation:  Sinus bradycardia with Premature atrial complexes Low voltage QRS Cannot rule out Anterior infarct , age undetermined Abnormal ECG Confirmed by Lita Mains  MD, Seiya Silsby ([id number]) on 07/31/2017 11:11:34 AM       Radiology Dg Chest 2 View  Result Date: 07/31/2017 CLINICAL DATA:  Atrial fibrillation EXAM: CHEST  2 VIEW COMPARISON:  06/03/2017 FINDINGS: Cardiac shadow is within normal limits. The lungs are well aerated bilaterally. No focal infiltrate or sizable effusion is seen. Degenerative changes of the thoracic spine are noted. IMPRESSION: No active cardiopulmonary disease. Electronically Signed   By: Inez Catalina M.D.   On: 07/31/2017 11:08    Procedures Procedures (including critical care time)  Medications Ordered in ED Medications - No data to display   Initial Impression / Assessment and Plan / ED Course  I have reviewed the triage vital signs and the nursing notes.  Pertinent labs & imaging results that were available during my care of the patient were reviewed by me and considered in my medical decision making (see chart for details).     Patient is well-appearing. Baseline bradycardia with normal blood pressure. He is not wanting any further workup in the emergency department. I think this is reasonable given recent  normal evaluation by his cardiologist. Have advised follow-up with his cardiologist and have given strict return precautions.  Final Clinical Impressions(s) / ED Diagnoses   Final diagnoses:  Paroxysmal atrial fibrillation [facility])    New Prescriptions New Prescriptions   No medications on file     Julianne Rice, MD 07/31/17 1126

## 2017-08-01 ENCOUNTER — Telehealth (HOSPITAL_COMMUNITY): Payer: Self-pay | Admitting: *Deleted

## 2017-08-01 NOTE — Telephone Encounter (Signed)
LMOM for pt to clbk to sched appt in afib clinic.  Pt on ED f/u report

## 2017-08-08 ENCOUNTER — Encounter (HOSPITAL_COMMUNITY): Payer: Self-pay | Admitting: Nurse Practitioner

## 2017-08-08 ENCOUNTER — Ambulatory Visit (HOSPITAL_COMMUNITY)
Admission: RE | Admit: 2017-08-08 | Discharge: 2017-08-08 | Disposition: A | Discharge status: Home / Self Care | Payer: Medicare Other | Source: Ambulatory Visit | Attending: Nurse Practitioner | Admitting: Nurse Practitioner

## 2017-08-08 VITALS — BP 134/68 | HR 61 | Ht 71.0 in | Wt 209.4 lb

## 2017-08-08 DIAGNOSIS — I480 Paroxysmal atrial fibrillation: Secondary | ICD-10-CM | POA: Insufficient documentation

## 2017-08-08 DIAGNOSIS — Z79899 Other long term (current) drug therapy: Secondary | ICD-10-CM | POA: Diagnosis not present

## 2017-08-08 DIAGNOSIS — E785 Hyperlipidemia, unspecified: Secondary | ICD-10-CM | POA: Insufficient documentation

## 2017-08-08 DIAGNOSIS — I4891 Unspecified atrial fibrillation: Secondary | ICD-10-CM | POA: Diagnosis present

## 2017-08-08 DIAGNOSIS — Z7901 Long term (current) use of anticoagulants: Secondary | ICD-10-CM | POA: Insufficient documentation

## 2017-08-08 DIAGNOSIS — Z85820 Personal history of malignant melanoma of skin: Secondary | ICD-10-CM | POA: Insufficient documentation

## 2017-08-08 DIAGNOSIS — Z9889 Other specified postprocedural states: Secondary | ICD-10-CM | POA: Insufficient documentation

## 2017-08-08 DIAGNOSIS — I2510 Atherosclerotic heart disease of native coronary artery without angina pectoris: Secondary | ICD-10-CM | POA: Diagnosis not present

## 2017-08-08 DIAGNOSIS — K219 Gastro-esophageal reflux disease without esophagitis: Secondary | ICD-10-CM | POA: Insufficient documentation

## 2017-08-08 DIAGNOSIS — I10 Essential (primary) hypertension: Secondary | ICD-10-CM | POA: Insufficient documentation

## 2017-08-08 MED ORDER — DILTIAZEM HCL 30 MG PO TABS: ORAL_TABLET | ORAL | 1 refills | Status: DC

## 2017-08-08 NOTE — Patient Instructions (Signed)
Your physician has recommended you make the following change in your medication:  1)Cardizem 30mg  -- take 1 tablet every 4 hours AS NEEDED for afib heart rate over 100 as long as top number of blood pressure over 100.

## 2017-08-09 NOTE — Progress Notes (Signed)
Primary Care Physician: [name], MD Referring Physician: [facility] ER f/u   [name] [name] is a 83 y.o. male with a h/o CAD,HTN,paroxtsmal afib , s/p ablation in 2016. He is being seen f/u in the afib clinic for f/u of a recent ER visit for afib that lasted 20 hours. He fortunately converted back to SR in triage. This afib came on after having to clean the house due to the housekeeper quitting a few weeks before that. He reports prior to that he will have an episode of afib every other month that lasts about 3-4 hours. No change in recent health.  Today, he denies symptoms of palpitations, chest pain, shortness of breath, orthopnea, PND, lower extremity edema, dizziness, presyncope, syncope, or neurologic sequela. The patient is tolerating medications without difficulties and is otherwise without complaint today.   Past Medical History:  Diagnosis Date  . Coronary artery disease    a. LHC on 04/06/16 which revealed 40% occl pRCA, 85% occl D1, 75% occl Ramus, 65% occl mLCx, 45% occl pLAD, 90% occl pLAD and nl LV function. s/p PCI/DES to prox LAD. Medical therapy for diag disease.    Marland Kitchen GERD (gastroesophageal reflux disease)   . Glaucoma   . Hemorrhoids    internal  . HTN (hypertension)   . Hx of colonic polyps    tubular adenoma   . Hyperlipidemia   . Kidney stone   . Melanoma of cheek ([place])   . Nephrolithiasis   . Pancolonic diverticulosis   . Paroxysmal atrial fibrillation (HCC)    a. s/p ablation 12/2014: on coumadin   Past Surgical History:  Procedure Laterality Date  . ATRIAL FIBRILLATION ABLATION N/A 11/12/2014   Procedure: ATRIAL FIBRILLATION ABLATION;  Surgeon: [name], MD;  Location: [facility] CATH LAB;  Service: Cardiovascular;  Laterality: N/A;  . BLADDER SURGERY     ruptured blood vessel in the bladder 3 weeks S/P ureter stones removed  . CARDIAC CATHETERIZATION N/A 04/06/2016   Procedure: Left Heart Cath and Coronary Angiography;  Surgeon: [name], MD;  Location: [facility] CV LAB;  Service: Cardiovascular;  Laterality: N/A;  . CARDIAC CATHETERIZATION N/A 04/06/2016   Procedure: Coronary Stent Intervention;  Surgeon: [name], MD;  Location: [place] CV LAB;  Service: Cardiovascular;  Laterality: N/A;  . CATARACT EXTRACTION W/ INTRAOCULAR LENS  IMPLANT, BILATERAL Bilateral   . COLONOSCOPY  06-29-12   per Dr. [name], adenomatous polyps, repeat in 3 yrs   . CORONARY ANGIOPLASTY WITH STENT PLACEMENT  04/06/2016   "1 stent"  . CYSTOSCOPY/RETROGRADE/URETEROSCOPY/STONE EXTRACTION WITH BASKET Left    removed 2 stones from left ureter  . INGUINAL HERNIA REPAIR Right 04/1990   Dr. [name]  . LAPAROSCOPIC CHOLECYSTECTOMY  02/1990   Dr. [name]  . MELANOMA EXCISION Right    "@ cheek near ear"  . TEE WITHOUT CARDIOVERSION N/A 11/11/2014   Procedure: TRANSESOPHAGEAL ECHOCARDIOGRAM (TEE);  Surgeon: [name], MD;  Location: [facility] ENDOSCOPY;  Service: Cardiovascular;  Laterality: N/A;  needs INR before case  . VASECTOMY      Current Outpatient Prescriptions  Medication Sig Dispense Refill  . acetaminophen (TYLENOL) 325 MG tablet Take 325 mg by mouth 2 (two) times daily.     Marland Kitchen atorvastatin (LIPITOR) 80 MG tablet Take 1 tablet (80 mg total) by mouth daily. (Patient taking differently: Take 80 mg by mouth at bedtime. ) 90 tablet 3  . cetirizine (ZYRTEC) 10 MG tablet Take 10 mg by  mouth every morning.     . latanoprost (XALATAN) 0.005 % ophthalmic solution Place 1 drop into both eyes at bedtime.     Marland Kitchen lisinopril (PRINIVIL,ZESTRIL) 10 MG tablet Take 10 mg by mouth at bedtime.     Marland Kitchen loratadine (CLARITIN) 10 MG tablet Take 10 mg by mouth at bedtime.     . metoprolol tartrate (LOPRESSOR) 50 MG tablet Take 12.5 mg by mouth 2 (two) times daily.    . mometasone (NASONEX) 50 MCG/ACT nasal spray Place 2 sprays into both nostrils at bedtime.     . Multiple Vitamins-Minerals (CENTRUM SILVER PO) Take 1 tablet by mouth every morning.     . nitroGLYCERIN (NITROSTAT) 0.4  MG SL tablet Place 1 tablet (0.4 mg total) under the tongue every 5 (five) minutes as needed for chest pain. 30 tablet 12  . pantoprazole (PROTONIX) 40 MG tablet Take 1 tablet (40 mg total) by mouth daily. (Patient taking differently: Take 40 mg by mouth at bedtime. ) 90 tablet 3  . potassium chloride (KLOR-CON) 10 MEQ CR tablet Take 10 mEq by mouth every morning.     . tamsulosin (FLOMAX) 0.4 MG CAPS Take 0.4 mg by mouth 2 (two) times daily.     Marland Kitchen warfarin (COUMADIN) 5 MG tablet TAKE 1 TABLET BY MOUTH DAILY OR AS DIRECTED BY COUMADIN CLINIC (Patient taking differently: Take 5 mg by mouth at bedtime) 90 tablet 1  . diltiazem (CARDIZEM) 30 MG tablet Take 1 tablet every 4 hours AS NEEDED for afib heart rate over 100 45 tablet 1   No current facility-administered medications for this encounter.     Allergies  Allergen Reactions  . Sulfa Antibiotics Other (See Comments)    Severe peeling of the skin on the groin area  . Sulfamethoxazole Other (See Comments)    REACTION: rash    Social History   Social History  . Marital status: Married    Spouse name: N/A  . Number of children: 2  . Years of education: N/A   Occupational History  . Retired    Social History Main Topics  . Smoking status: Never Smoker  . Smokeless tobacco: Never Used  . Alcohol use No  . Drug use: No  . Sexual activity: Not Currently   Other Topics Concern  . Not on file   Social History Narrative  . No narrative on file    Family History  Problem Relation Age of Onset  . Heart attack Father 39  . Heart disease Father   . Ovarian cancer Mother     ROS- All systems are reviewed and negative except as per the HPI above  Physical Exam: Vitals:   08/08/17 1039  BP: 134/68  Pulse: 61  Weight: 209 lb 6.4 oz (95 kg)  Height: 5\' 11"  (1.803 m)   Wt Readings from Last 3 Encounters:  08/08/17 209 lb 6.4 oz (95 kg)  07/31/17 208 lb (94.3 kg)  07/18/17 208 lb (94.3 kg)    Labs: Lab Results  Component  Value Date   NA 140 06/03/2017   K 4.3 06/03/2017   CL 104 06/03/2017   CO2 30 06/03/2017   GLUCOSE 111 (H) 06/03/2017   BUN 14 06/03/2017   CREATININE 1.04 06/03/2017   CALCIUM 9.3 06/03/2017   MG 2.1 03/29/2013   Lab Results  Component Value Date   INR 2.6 06/30/2017   Lab Results  Component Value Date   CHOL 127 08/25/2016   HDL 39.60 08/25/2016  [place] 58 08/25/2016   TRIG 145.0 08/25/2016     GEN- The patient is well appearing, alert and oriented x 3 today.   Head- normocephalic, atraumatic Eyes-  Sclera clear, conjunctiva pink Ears- hearing intact Oropharynx- clear Neck- supple, no JVP Lymph- no cervical lymphadenopathy Lungs- Clear to ausculation bilaterally, normal work of breathing Heart- Regular rate and rhythm, no murmurs, rubs or gallops, PMI not laterally displaced GI- soft, NT, ND, + BS Extremities- no clubbing, cyanosis, or edema MS- no significant deformity or atrophy Skin- no rash or lesion Psych- euthymic mood, full affect Neuro- strength and sensation are intact  EKG-NSR at 61 bpm,. Pr int 158 ms, qrs int 82 ms, qtc 420 ms Epic records reviewed    Assessment and Plan: 1. Paroxysmal afib Overall, afib burden is very infrequent Discussed options, but for now, he wants to  hold the course Will Rx cardizem 30 mg to use for breakthrough afib Continue metoprolol Continue wafarin  for chadsvasc score of at least 3.   He will call if afib burden increases and will discuss options again at that point  [place], [facility] [address] [place], [place] [address] [phone]

## 2017-08-11 ENCOUNTER — Ambulatory Visit (INDEPENDENT_AMBULATORY_CARE_PROVIDER_SITE_OTHER): Payer: Medicare Other | Admitting: *Deleted

## 2017-08-11 DIAGNOSIS — I4891 Unspecified atrial fibrillation: Secondary | ICD-10-CM

## 2017-08-11 DIAGNOSIS — Z5181 Encounter for therapeutic drug level monitoring: Secondary | ICD-10-CM

## 2017-08-11 DIAGNOSIS — Z7901 Long term (current) use of anticoagulants: Secondary | ICD-10-CM | POA: Diagnosis not present

## 2017-08-11 DIAGNOSIS — I2510 Atherosclerotic heart disease of native coronary artery without angina pectoris: Secondary | ICD-10-CM | POA: Diagnosis not present

## 2017-08-11 DIAGNOSIS — I480 Paroxysmal atrial fibrillation: Secondary | ICD-10-CM

## 2017-08-11 DIAGNOSIS — Z9861 Coronary angioplasty status: Secondary | ICD-10-CM | POA: Diagnosis not present

## 2017-08-11 LAB — POCT INR: INR: 2

## 2017-08-23 ENCOUNTER — Encounter: Payer: Self-pay | Admitting: Family Medicine

## 2017-08-23 ENCOUNTER — Other Ambulatory Visit: Payer: Self-pay | Admitting: Cardiology

## 2017-08-23 NOTE — Telephone Encounter (Signed)
Tell him yes she can give [place] a urine sample to check that day

## 2017-08-24 NOTE — Telephone Encounter (Signed)
Yes, we will run a BMET also

## 2017-08-26 ENCOUNTER — Ambulatory Visit (INDEPENDENT_AMBULATORY_CARE_PROVIDER_SITE_OTHER): Payer: Medicare Other | Admitting: Family Medicine

## 2017-08-26 ENCOUNTER — Encounter: Payer: Self-pay | Admitting: Family Medicine

## 2017-08-26 VITALS — BP 128/70 | HR 56 | Temp 98.5°F | Ht 71.0 in | Wt 208.0 lb

## 2017-08-26 DIAGNOSIS — K219 Gastro-esophageal reflux disease without esophagitis: Secondary | ICD-10-CM | POA: Diagnosis not present

## 2017-08-26 DIAGNOSIS — N138 Other obstructive and reflux uropathy: Secondary | ICD-10-CM | POA: Diagnosis not present

## 2017-08-26 DIAGNOSIS — I480 Paroxysmal atrial fibrillation: Secondary | ICD-10-CM

## 2017-08-26 DIAGNOSIS — I2510 Atherosclerotic heart disease of native coronary artery without angina pectoris: Secondary | ICD-10-CM

## 2017-08-26 DIAGNOSIS — N401 Enlarged prostate with lower urinary tract symptoms: Secondary | ICD-10-CM | POA: Diagnosis not present

## 2017-08-26 DIAGNOSIS — Z9861 Coronary angioplasty status: Secondary | ICD-10-CM | POA: Diagnosis not present

## 2017-08-26 DIAGNOSIS — I10 Essential (primary) hypertension: Secondary | ICD-10-CM

## 2017-08-26 DIAGNOSIS — E782 Mixed hyperlipidemia: Secondary | ICD-10-CM

## 2017-08-26 LAB — HEPATIC FUNCTION PANEL
ALK PHOS: 56 U/L (ref 39–117)
ALT: 25 U/L (ref 0–53)
AST: 24 U/L (ref 0–37)
Albumin: 4.4 g/dL (ref 3.5–5.2)
BILIRUBIN DIRECT: 0.1 mg/dL (ref 0.0–0.3)
TOTAL PROTEIN: 6.5 g/dL (ref 6.0–8.3)
Total Bilirubin: 0.6 mg/dL (ref 0.2–1.2)

## 2017-08-26 LAB — BASIC METABOLIC PANEL
BUN: 16 mg/dL (ref 6–23)
CALCIUM: 9.3 mg/dL (ref 8.4–10.5)
CO2: 33 mEq/L — ABNORMAL HIGH (ref 19–32)
Chloride: 99 mEq/L (ref 96–112)
Creatinine, Ser: 0.98 mg/dL (ref 0.40–1.50)
GFR: 77.05 mL/min (ref >60.00)
GLUCOSE: 109 mg/dL — AB (ref 70–99)
POTASSIUM: 4.1 meq/L (ref 3.5–5.1)
SODIUM: 141 meq/L (ref 135–145)

## 2017-08-26 LAB — POC URINALSYSI DIPSTICK (AUTOMATED)
Bilirubin, UA: NEGATIVE
Glucose, UA: NEGATIVE
Ketones, UA: NEGATIVE
Leukocytes, UA: NEGATIVE
NITRITE UA: NEGATIVE
PROTEIN UA: NEGATIVE
UROBILINOGEN UA: 0.2 U/dL
pH, UA: 5.5 (ref 5.0–8.0)

## 2017-08-26 LAB — LIPID PANEL
CHOL/HDL RATIO: 3
CHOLESTEROL: 127 mg/dL (ref 0–200)
HDL: 41 mg/dL (ref >39.00)
LDL CALC: 61 mg/dL (ref 0–99)
NonHDL: 86.01
Triglycerides: 124 mg/dL (ref 0.0–149.0)
VLDL: 24.8 mg/dL (ref 0.0–40.0)

## 2017-08-26 LAB — CBC WITH DIFFERENTIAL/PLATELET
BASOS ABS: 0 10*3/uL (ref 0.0–0.1)
Basophils Relative: 0.2 % (ref 0.0–3.0)
EOS ABS: 0.3 10*3/uL (ref 0.0–0.7)
EOS PCT: 3.6 % (ref 0.0–5.0)
HEMATOCRIT: 40.5 % (ref 39.0–52.0)
Hemoglobin: 13.3 g/dL (ref 13.0–17.0)
LYMPHS PCT: 23.8 % (ref 12.0–46.0)
Lymphs Abs: 1.9 10*3/uL (ref 0.7–4.0)
MCHC: 32.9 g/dL (ref 30.0–36.0)
MCV: 98.5 fl (ref 78.0–100.0)
Monocytes Absolute: 0.8 10*3/uL (ref 0.1–1.0)
Monocytes Relative: 9.4 % (ref 3.0–12.0)
NEUTROS ABS: 5.1 10*3/uL (ref 1.4–7.7)
NEUTROS PCT: 63 % (ref 43.0–77.0)
PLATELETS: 184 10*3/uL (ref 150.0–400.0)
RBC: 4.12 Mil/uL — AB (ref 4.22–5.81)
RDW: 14 % (ref 11.5–15.5)
WBC: 8.1 10*3/uL (ref 4.0–10.5)

## 2017-08-26 LAB — TSH: TSH: 1.36 u[IU]/mL (ref 0.35–4.50)

## 2017-08-26 LAB — PSA: PSA: 1.36 ng/mL (ref 0.10–4.00)

## 2017-08-26 NOTE — Progress Notes (Signed)
   Subjective:    Patient ID: [name] [name], male    DOB: [birth year], 83 y.o.   MRN: [id number]  HPI Here to follow up on issues. He feels well today. He ad another bout of atrial fibrillation that last ed about 20 hours a few weeks ago but this resolved spontaneously. The Cardiology office gave him some Diltiazem 30 mg tablets to use if he goes into a fib with a rate over 100. His BP is stable.    Review of Systems  Constitutional: Negative.   HENT: Negative.   Eyes: Negative.   Respiratory: Negative.   Cardiovascular: Negative.   Gastrointestinal: Negative.   Genitourinary: Negative.   Musculoskeletal: Negative.   Skin: Negative.   Neurological: Negative.   Psychiatric/Behavioral: Negative.        Objective:   Physical Exam  Constitutional: He is oriented to person, place, and time. He appears well-developed and well-nourished. No distress.  HENT:  Head: Normocephalic and atraumatic.  Right Ear: External ear normal.  Left Ear: External ear normal.  Nose: Nose normal.  Mouth/Throat: Oropharynx is clear and moist. No oropharyngeal exudate.  Eyes: Pupils are equal, round, and reactive to light. Conjunctivae and EOM are normal. Right eye exhibits no discharge. Left eye exhibits no discharge. No scleral icterus.  Neck: Neck supple. No JVD present. No tracheal deviation present. No thyromegaly present.  Cardiovascular: Normal rate, regular rhythm, normal heart sounds and intact distal pulses.  Exam reveals no gallop and no friction rub.   No murmur heard. Pulmonary/Chest: Effort normal and breath sounds normal. No respiratory distress. He has no wheezes. He has no rales. He exhibits no tenderness.  Abdominal: Soft. Bowel sounds are normal. He exhibits no distension and no mass. There is no tenderness. There is no rebound and no guarding.  Musculoskeletal: Normal range of motion. He exhibits no edema or tenderness.  Lymphadenopathy:    He has no cervical adenopathy.  Neurological:  He is alert and oriented to person, place, and time. He has normal reflexes. No cranial nerve deficit. He exhibits normal muscle tone. Coordination normal.  Skin: Skin is warm and dry. No rash noted. He is not diaphoretic. No erythema. No pallor.  Psychiatric: He has a normal mood and affect. His behavior is normal. Judgment and thought content normal.          Assessment & Plan:  His HTN is stable. His paroxysmal atrial fibrillation is stable for the time being. GERD is stable. We will gest fasting labs today. He will see Dr. Junious Silk for a Urology follow up next week.  [name], MD

## 2017-08-26 NOTE — Patient Instructions (Signed)
WE NOW OFFER   [facility] FAST TRACK!!!  SAME DAY Appointments for ACUTE CARE  Such as: Sprains, Injuries, cuts, abrasions, rashes, muscle pain, joint pain, back pain Colds, flu, sore throats, headache, allergies, cough, fever  Ear pain, sinus and eye infections Abdominal pain, nausea, vomiting, diarrhea, upset stomach Animal/insect bites  3 Easy Ways to Schedule: Walk-In Scheduling Call in scheduling Mychart Sign-up: [email or web address]         

## 2017-09-05 DIAGNOSIS — R3912 Poor urinary stream: Secondary | ICD-10-CM | POA: Diagnosis not present

## 2017-09-05 DIAGNOSIS — N401 Enlarged prostate with lower urinary tract symptoms: Secondary | ICD-10-CM | POA: Diagnosis not present

## 2017-09-05 DIAGNOSIS — N200 Calculus of kidney: Secondary | ICD-10-CM | POA: Diagnosis not present

## 2017-09-08 ENCOUNTER — Encounter: Payer: Self-pay | Admitting: Family Medicine

## 2017-09-13 NOTE — Telephone Encounter (Signed)
I typically treat UTI's with 7 days of antibiotics. If she starts to have symptoms again let us know

## 2017-09-22 ENCOUNTER — Ambulatory Visit (INDEPENDENT_AMBULATORY_CARE_PROVIDER_SITE_OTHER): Payer: Medicare Other | Admitting: *Deleted

## 2017-09-22 DIAGNOSIS — Z7901 Long term (current) use of anticoagulants: Secondary | ICD-10-CM

## 2017-09-22 DIAGNOSIS — I480 Paroxysmal atrial fibrillation: Secondary | ICD-10-CM

## 2017-09-22 DIAGNOSIS — Z5181 Encounter for therapeutic drug level monitoring: Secondary | ICD-10-CM

## 2017-09-22 DIAGNOSIS — I4891 Unspecified atrial fibrillation: Secondary | ICD-10-CM | POA: Diagnosis not present

## 2017-09-22 LAB — POCT INR: INR: 2.1

## 2017-09-22 NOTE — Patient Instructions (Signed)
Continue on same dosage 5mg s daily.  Recheck in 6 weeks. Call with any questions or concerns [phone]

## 2017-09-23 DIAGNOSIS — H401132 Primary open-angle glaucoma, bilateral, moderate stage: Secondary | ICD-10-CM | POA: Diagnosis not present

## 2017-09-23 DIAGNOSIS — H524 Presbyopia: Secondary | ICD-10-CM | POA: Diagnosis not present

## 2017-09-23 DIAGNOSIS — Z961 Presence of intraocular lens: Secondary | ICD-10-CM | POA: Diagnosis not present

## 2017-10-27 ENCOUNTER — Encounter: Payer: Self-pay | Admitting: Family Medicine

## 2017-10-27 ENCOUNTER — Ambulatory Visit: Payer: Self-pay

## 2017-10-27 ENCOUNTER — Ambulatory Visit (INDEPENDENT_AMBULATORY_CARE_PROVIDER_SITE_OTHER)
Admission: RE | Admit: 2017-10-27 | Discharge: 2017-10-27 | Disposition: A | Discharge status: Home / Self Care | Payer: Medicare Other | Source: Ambulatory Visit | Attending: Family Medicine | Admitting: Family Medicine

## 2017-10-27 ENCOUNTER — Ambulatory Visit (INDEPENDENT_AMBULATORY_CARE_PROVIDER_SITE_OTHER): Payer: Medicare Other | Admitting: Family Medicine

## 2017-10-27 VITALS — BP 140/60 | HR 83 | Temp 99.8°F | Wt 210.8 lb

## 2017-10-27 DIAGNOSIS — I2510 Atherosclerotic heart disease of native coronary artery without angina pectoris: Secondary | ICD-10-CM

## 2017-10-27 DIAGNOSIS — R05 Cough: Secondary | ICD-10-CM

## 2017-10-27 DIAGNOSIS — Z9861 Coronary angioplasty status: Secondary | ICD-10-CM | POA: Diagnosis not present

## 2017-10-27 DIAGNOSIS — R0981 Nasal congestion: Secondary | ICD-10-CM | POA: Diagnosis not present

## 2017-10-27 DIAGNOSIS — R059 Cough, unspecified: Secondary | ICD-10-CM

## 2017-10-27 MED ORDER — HYDROCODONE-HOMATROPINE 5-1.5 MG/5ML PO SYRP: 5.0000 mL | ORAL_SOLUTION | Freq: Three times a day (TID) | ORAL | 0 refills | Status: DC | PRN

## 2017-10-27 MED ORDER — AZITHROMYCIN 250 MG PO TABS: ORAL_TABLET | ORAL | 0 refills | Status: DC

## 2017-10-27 NOTE — Telephone Encounter (Signed)
  Reason for Disposition . SEVERE coughing spells (e.g., whooping sound after coughing, vomiting after coughing)  Answer Assessment - Initial Assessment Questions 1. ONSET: "When did the cough begin?"      Started last wek 2. SEVERITY: "How bad is the cough today?"      Severe 3. RESPIRATORY DISTRESS: "Describe your breathing."      No 4. FEVER: "Do you have a fever?" If so, ask: "What is your temperature, how was it measured, and when did it start?"     Possibly 5. SPUTUM: "Describe the color of your sputum" (clear, white, yellow, green)     No 6. HEMOPTYSIS: "Are you coughing up any blood?" If so ask: "How much?" (flecks, streaks, tablespoons, etc.)     Seeing blood in nasal drainage 7. CARDIAC HISTORY: "Do you have any history of heart disease?" (e.g., heart attack, congestive heart failure)      High blood pressure 8. LUNG HISTORY: "Do you have any history of lung disease?"  (e.g., pulmonary embolus, asthma, emphysema)     No 9. PE RISK FACTORS: "Do you have a history of blood clots?" (or: recent major surgery, recent prolonged travel, bedridden )     No 10. OTHER SYMPTOMS: "Do you have any other symptoms?" (e.g., runny nose, wheezing, chest pain)       Sounds loose 11. PREGNANCY: "Is there any chance you are pregnant?" "When was your last menstrual period?"       No  12. TRAVEL: "Have you traveled out of the country in the last month?" (e.g., travel history, exposures)       No  Protocols used: [place] Pt. States "I'm afraid this is going to go into pneumonia." Appointment made for today.

## 2017-10-27 NOTE — Progress Notes (Signed)
Subjective:    Patient ID: [name] [name], male    DOB: 30-Jul-1929, 83 y.o.   MRN: [id number]  No chief complaint on file.   HPI Patient was seen today for acute concern.  Patient with coughing, nasal congestion, minimal blood noted on tissue after blowing nose, slight fever since yesterday.  Patient is taking the Hydromet with some relief.  Patient endorses increased coughing at night.  Patient using Nasonex, Claritin, Zyrtec.  Of note patient is on Coumadin daily.  Past Medical History:  Diagnosis Date  . Coronary artery disease    a. LHC on 04/06/16 which revealed 40% occl pRCA, 85% occl D1, 75% occl Ramus, 65% occl mLCx, 45% occl pLAD, 90% occl pLAD and nl LV function. s/p PCI/DES to prox LAD. Medical therapy for diag disease.    Marland Kitchen GERD (gastroesophageal reflux disease)   . Glaucoma   . Hemorrhoids    internal  . HTN (hypertension)   . Hx of colonic polyps    tubular adenoma   . Hyperlipidemia   . Kidney stone   . Melanoma of cheek ([place])   . Nephrolithiasis   . Pancolonic diverticulosis   . Paroxysmal atrial fibrillation (HCC)    a. s/p ablation 12/2014: on coumadin    Allergies  Allergen Reactions  . Sulfa Antibiotics Other (See Comments)    Severe peeling of the skin on the groin area  . Sulfamethoxazole Other (See Comments)    REACTION: rash    ROS General: Denies fever, chills, night sweats, changes in weight, changes in appetite  +fever HEENT: Denies headaches, ear pain, changes in vision, rhinorrhea, sore throat  +nasal congestion with minimal blood noted while blowing nose. CV: Denies CP, palpitations, SOB, orthopnea Pulm: Denies SOB, wheezing  +cough GI: Denies abdominal pain, nausea, vomiting, diarrhea, constipation GU: Denies dysuria, hematuria, frequency, vaginal discharge Msk: Denies muscle cramps, joint pains Neuro: Denies weakness, numbness, tingling Skin: Denies rashes, bruising Psych: Denies depression, anxiety, hallucinations     Objective:     Blood pressure 140/60, pulse 83, temperature 99.8 F (37.7 C), temperature source Oral, weight 210 lb 12.8 oz (95.6 kg), SpO2 97 %.   Gen. Pleasant, well-nourished, in no distress, normal affect  HEENT: [place]/AT, face symmetric, no scleral icterus, PERRLA, nares patent without drainage, pharynx with mild erythema or exudate. No cervical lymphadenopathy. Lungs: cough, no accessory muscle use, faint bibasilar rales, no wheezes Cardiovascular: RRR, no m/r/g, no peripheral edema Neuro:  A&Ox3, CN II-XII intact, normal gait Skin:  Warm, no lesions/ rash   Wt Readings from Last 3 Encounters:  10/27/17 210 lb 12.8 oz (95.6 kg)  08/26/17 208 lb (94.3 kg)  08/08/17 209 lb 6.4 oz (95 kg)    Lab Results  Component Value Date   WBC 8.1 08/26/2017   HGB 13.3 08/26/2017   HCT 40.5 08/26/2017   PLT 184.0 08/26/2017   GLUCOSE 109 (H) 08/26/2017   CHOL 127 08/26/2017   TRIG 124.0 08/26/2017   HDL 41.00 08/26/2017   LDLCALC 61 08/26/2017   ALT 25 08/26/2017   AST 24 08/26/2017   NA 141 08/26/2017   K 4.1 08/26/2017   CL 99 08/26/2017   CREATININE 0.98 08/26/2017   BUN 16 08/26/2017   CO2 33 (H) 08/26/2017   TSH 1.36 08/26/2017   PSA 1.36 08/26/2017   INR 2.1 09/22/2017    Assessment/Plan:  Cough  -discussed viral vs bacterial cause given 2 day duration. - Plan: DG Chest 2 View, HYDROcodone-homatropine (HYCODAN) 5-1.5  MG/5ML syrup -Pt upset he was not given an abx prior to CXR being done. -Pt called and notified of negative CXR results.  Discussed sending in a wait and see rx as pt did have faint rales on exam.  Nasal congestion -supportive care -continue nasonex and claritin or zyrtec   F/u prn if symptoms become worse or do not improve.    Grier Mitts, MD

## 2017-10-27 NOTE — Patient Instructions (Addendum)
Cough, Adult  Coughing is a reflex that clears your throat and your airways. Coughing helps to heal and protect your lungs. It is normal to cough occasionally, but a cough that happens with other symptoms or lasts a long time may be a sign of a condition that needs treatment. A cough may last only 2-3 weeks (acute), or it may last longer than 8 weeks (chronic).  What are the causes?  Coughing is commonly caused by:   Breathing in substances that irritate your lungs.   A viral or bacterial respiratory infection.   Allergies.   Asthma.   Postnasal drip.   Smoking.   Acid backing up from the stomach into the esophagus (gastroesophageal reflux).   Certain medicines.   Chronic lung problems, including COPD (or rarely, lung cancer).   Other medical conditions such as heart failure.    Follow these instructions at home:  Pay attention to any changes in your symptoms. Take these actions to help with your discomfort:   Take medicines only as told by your health care provider.  ? If you were prescribed an antibiotic medicine, take it as told by your health care provider. Do not stop taking the antibiotic even if you start to feel better.  ? Talk with your health care provider before you take a cough suppressant medicine.   Drink enough fluid to keep your urine clear or pale yellow.   If the air is dry, use a cold steam vaporizer or humidifier in your bedroom or your home to help loosen secretions.   Avoid anything that causes you to cough at work or at home.   If your cough is worse at night, try sleeping in a semi-upright position.   Avoid cigarette smoke. If you smoke, quit smoking. If you need help quitting, ask your health care provider.   Avoid caffeine.   Avoid alcohol.   Rest as needed.    Contact a health care provider if:   You have new symptoms.   You cough up pus.   Your cough does not get better after 2-3 weeks, or your cough gets worse.   You cannot control your cough with suppressant  medicines and you are losing sleep.   You develop pain that is getting worse or pain that is not controlled with pain medicines.   You have a fever.   You have unexplained weight loss.   You have night sweats.  Get help right away if:   You cough up blood.   You have difficulty breathing.   Your heartbeat is very fast.  This information is not intended to replace advice given to you by your health care provider. Make sure you discuss any questions you have with your health care provider.  Document Released: 04/23/2011 Document Revised: 04/01/2016 Document Reviewed: 01/01/2015  Elsevier Interactive Patient Education  2018 [employer]

## 2017-11-03 ENCOUNTER — Ambulatory Visit (INDEPENDENT_AMBULATORY_CARE_PROVIDER_SITE_OTHER): Payer: Medicare Other | Admitting: *Deleted

## 2017-11-03 DIAGNOSIS — I4891 Unspecified atrial fibrillation: Secondary | ICD-10-CM

## 2017-11-03 DIAGNOSIS — Z7901 Long term (current) use of anticoagulants: Secondary | ICD-10-CM | POA: Diagnosis not present

## 2017-11-03 DIAGNOSIS — Z5181 Encounter for therapeutic drug level monitoring: Secondary | ICD-10-CM

## 2017-11-03 LAB — POCT INR: INR: 1.9

## 2017-11-03 NOTE — Patient Instructions (Addendum)
  Description   Today take 1.5 tablets then continue on same dosage 5mg s daily.  Recheck in 5 weeks. Call with any questions or concerns [phone]

## 2017-11-06 ENCOUNTER — Emergency Department (HOSPITAL_COMMUNITY): Payer: Medicare Other

## 2017-11-06 ENCOUNTER — Encounter (HOSPITAL_COMMUNITY): Payer: Self-pay | Admitting: Emergency Medicine

## 2017-11-06 ENCOUNTER — Emergency Department (HOSPITAL_COMMUNITY)
Admission: EM | Admit: 2017-11-06 | Discharge: 2017-11-06 | Disposition: A | Discharge status: Home / Self Care | Payer: Medicare Other | Attending: Emergency Medicine | Admitting: Emergency Medicine

## 2017-11-06 ENCOUNTER — Other Ambulatory Visit: Payer: Self-pay

## 2017-11-06 DIAGNOSIS — I10 Essential (primary) hypertension: Secondary | ICD-10-CM | POA: Insufficient documentation

## 2017-11-06 DIAGNOSIS — Z7901 Long term (current) use of anticoagulants: Secondary | ICD-10-CM | POA: Insufficient documentation

## 2017-11-06 DIAGNOSIS — I2510 Atherosclerotic heart disease of native coronary artery without angina pectoris: Secondary | ICD-10-CM | POA: Insufficient documentation

## 2017-11-06 DIAGNOSIS — Z955 Presence of coronary angioplasty implant and graft: Secondary | ICD-10-CM | POA: Diagnosis not present

## 2017-11-06 DIAGNOSIS — I480 Paroxysmal atrial fibrillation: Secondary | ICD-10-CM

## 2017-11-06 DIAGNOSIS — Z79899 Other long term (current) drug therapy: Secondary | ICD-10-CM | POA: Diagnosis not present

## 2017-11-06 DIAGNOSIS — I481 Persistent atrial fibrillation: Secondary | ICD-10-CM

## 2017-11-06 DIAGNOSIS — J189 Pneumonia, unspecified organism: Secondary | ICD-10-CM | POA: Diagnosis not present

## 2017-11-06 DIAGNOSIS — R002 Palpitations: Secondary | ICD-10-CM | POA: Insufficient documentation

## 2017-11-06 DIAGNOSIS — I4891 Unspecified atrial fibrillation: Secondary | ICD-10-CM | POA: Diagnosis not present

## 2017-11-06 LAB — BASIC METABOLIC PANEL
ANION GAP: 10 (ref 5–15)
BUN: 23 mg/dL — AB (ref 6–20)
CALCIUM: 9.3 mg/dL (ref 8.9–10.3)
CO2: 26 mmol/L (ref 22–32)
CREATININE: 1.23 mg/dL (ref 0.61–1.24)
Chloride: 103 mmol/L (ref 101–111)
GFR calc Af Amer: 59 mL/min — ABNORMAL LOW (ref >60)
GFR, EST NON AFRICAN AMERICAN: 51 mL/min — AB (ref >60)
GLUCOSE: 174 mg/dL — AB (ref 65–99)
Potassium: 4.5 mmol/L (ref 3.5–5.1)
Sodium: 139 mmol/L (ref 135–145)

## 2017-11-06 LAB — CBC
HCT: 43.8 % (ref 39.0–52.0)
Hemoglobin: 14.2 g/dL (ref 13.0–17.0)
MCH: 31.6 pg (ref 26.0–34.0)
MCHC: 32.4 g/dL (ref 30.0–36.0)
MCV: 97.3 fL (ref 78.0–100.0)
PLATELETS: 288 10*3/uL (ref 150–400)
RBC: 4.5 MIL/uL (ref 4.22–5.81)
RDW: 13.5 % (ref 11.5–15.5)
WBC: 11.8 10*3/uL — ABNORMAL HIGH (ref 4.0–10.5)

## 2017-11-06 LAB — PROTIME-INR
INR: 2.58
PROTHROMBIN TIME: 27.5 s — AB (ref 11.4–15.2)

## 2017-11-06 LAB — TROPONIN I: Troponin I: <0.03 ng/mL (ref <0.03)

## 2017-11-06 MED ORDER — IOPAMIDOL (ISOVUE-300) INJECTION 61%
INTRAVENOUS | Status: AC
Start: 2017-11-06 — End: 2017-11-06
  Administered 2017-11-06: 75 mL
  Filled 2017-11-06: qty 75

## 2017-11-06 MED ORDER — SODIUM CHLORIDE 0.9 % IV BOLUS (SEPSIS)
1000.0000 mL | Freq: Once | INTRAVENOUS | Status: AC
  Administered 2017-11-06: 1000 mL via INTRAVENOUS

## 2017-11-06 NOTE — ED Triage Notes (Signed)
Pt. Stated, I went into A-Fib on Thursday and Im still in A-Fib.  Only other symptom is weakness.

## 2017-11-06 NOTE — Consult Note (Signed)
Cardiology Admission History and Physical:   Patient ID: [name] [name]; MRN: [id number]; DOB: 1929/11/08   Admission date: 11/06/2017  Primary Care Provider: [name], MD Primary Cardiologist: [name], MD  Primary Electrophysiologist:  NA  Chief Complaint:  Atrial fib  Patient Profile:   [name] [name] is a 83 y.o. male with a history of PAF, HTN,  CAD, and PCI to LAD with DES in 03/2016, recent episodes of weakness with neg stress test, BB decreased.    [name] [name] is a 83 y.o. male who is being seen today for the evaluation of atrial fib  at the request of Dr. Regenia Skeeter    History of Present Illness:   [name] [name] has a history of PAF, HTN,  CAD, and PCI to LAD with DES in 03/2016, recent episodes of weakness with neg stress test 06/2017, BB decreased.   Holter monitor with no sustained arrhthymias but short bursts of SVT vs atypical flutter with 2:1 conduction.    Now presents to ER with a fib, went into on the 27th and has not  Converted back to NSR yet  He denies any chest pain.  He denies any shortness of breath,  He has had an upper respiratory tract infection a week or so ago and just completed a course of Z-Pak. He says that he is a little bit more fatigued than usual but otherwise is not symptomatic.  The atrial fibrillation has not slowed him down a bit and is not keeping him from doing his regular daily activities.  He denies any PND or orthopnea.  He exercises regularly.  He has not had any episodes of angina.  He denies any fever.  He has had a cough productive of some yellow clear sputum.  EKG with a fib rate 97 no acute changes otherwise I personally reviewed.  Pt is on coumadin and INR is 2.58  WBC is 11.8, K+ 4.5, Cr 1.23 and Hgb is 14.2 Troponin <0.03 2 V CXR clear     Past Medical History:  Diagnosis Date  . Coronary artery disease    a. LHC on 04/06/16 which revealed 40% occl pRCA, 85% occl D1, 75% occl Ramus, 65% occl mLCx, 45% occl  pLAD, 90% occl pLAD and nl LV function. s/p PCI/DES to prox LAD. Medical therapy for diag disease.    Marland Kitchen GERD (gastroesophageal reflux disease)   . Glaucoma   . Hemorrhoids    internal  . HTN (hypertension)   . Hx of colonic polyps    tubular adenoma   . Hyperlipidemia   . Kidney stone   . Melanoma of cheek ([place])   . Nephrolithiasis   . Pancolonic diverticulosis   . Paroxysmal atrial fibrillation (HCC)    a. s/p ablation 12/2014: on coumadin    Past Surgical History:  Procedure Laterality Date  . ATRIAL FIBRILLATION ABLATION N/A 11/12/2014   Procedure: ATRIAL FIBRILLATION ABLATION;  Surgeon: [name], MD;  Location: [facility] CATH LAB;  Service: Cardiovascular;  Laterality: N/A;  . BLADDER SURGERY     ruptured blood vessel in the bladder 3 weeks S/P ureter stones removed  . CARDIAC CATHETERIZATION N/A 04/06/2016   Procedure: Left Heart Cath and Coronary Angiography;  Surgeon: [name], MD;  Location: [place] CV LAB;  Service: Cardiovascular;  Laterality: N/A;  . CARDIAC CATHETERIZATION N/A 04/06/2016   Procedure: Coronary Stent Intervention;  Surgeon: [name], MD;  Location: [place] CV LAB;  Service: Cardiovascular;  Laterality:  N/A;  . CATARACT EXTRACTION W/ INTRAOCULAR LENS  IMPLANT, BILATERAL Bilateral   . COLONOSCOPY  06-29-12   per Dr. [name], adenomatous polyps, repeat in 3 yrs   . CORONARY ANGIOPLASTY WITH STENT PLACEMENT  04/06/2016   "1 stent"  . CYSTOSCOPY/RETROGRADE/URETEROSCOPY/STONE EXTRACTION WITH BASKET Left    removed 2 stones from left ureter  . INGUINAL HERNIA REPAIR Right 04/1990   Dr. [name]  . LAPAROSCOPIC CHOLECYSTECTOMY  02/1990   Dr. [name]  . MELANOMA EXCISION Right    "@ cheek near ear"  . TEE WITHOUT CARDIOVERSION N/A 11/11/2014   Procedure: TRANSESOPHAGEAL ECHOCARDIOGRAM (TEE);  Surgeon: [name], MD;  Location: [facility] ENDOSCOPY;  Service: Cardiovascular;  Laterality: N/A;  needs INR before case  . VASECTOMY       Medications Prior  to Admission: Prior to Admission medications   Medication Sig Start Date End Date Taking? Authorizing Provider  acetaminophen (TYLENOL) 325 MG tablet Take 325 mg by mouth 2 (two) times daily.    Yes [provider]  atorvastatin (LIPITOR) 80 MG tablet Take 1 tablet (80 mg total) by mouth daily. Patient taking differently: Take 80 mg by mouth at bedtime.  04/19/16  Yes Eileen Stanford, PA-C  cetirizine (ZYRTEC) 10 MG tablet Take 10 mg by mouth every morning.    Yes [provider]  diltiazem (CARDIZEM) 30 MG tablet Take 1 tablet every 4 hours AS NEEDED for afib heart rate over 100 08/08/17  Yes Sherran Needs, NP  finasteride (PROSCAR) 5 MG tablet Take 5 mg daily by mouth.   Yes [provider]  HYDROcodone-homatropine (HYCODAN) 5-1.5 MG/5ML syrup Take 5 mLs by mouth every 8 (eight) hours as needed for cough. 10/27/17  Yes Billie Ruddy, MD  latanoprost (XALATAN) 0.005 % ophthalmic solution Place 1 drop into both eyes at bedtime.    Yes [provider]  lisinopril (PRINIVIL,ZESTRIL) 10 MG tablet Take 10 mg by mouth at bedtime.    Yes [provider]  loratadine (CLARITIN) 10 MG tablet Take 10 mg by mouth at bedtime.    Yes [provider]  metoprolol tartrate (LOPRESSOR) 50 MG tablet Take 25 mg by mouth 2 (two) times daily.    Yes [provider]  mometasone (NASONEX) 50 MCG/ACT nasal spray Place 2 sprays into both nostrils at bedtime.    Yes [provider]  Multiple Vitamins-Minerals (CENTRUM SILVER PO) Take 1 tablet by mouth every morning.    Yes [provider]  pantoprazole (PROTONIX) 40 MG tablet Take 1 tablet (40 mg total) by mouth daily. 04/19/16  Yes Eileen Stanford, PA-C  potassium chloride (KLOR-CON) 10 MEQ CR tablet Take 10 mEq by mouth every morning.    Yes [provider]  tamsulosin (FLOMAX) 0.4 MG CAPS Take 0.4 mg by mouth 2 (two) times daily.    Yes [provider]  warfarin  (COUMADIN) 5 MG tablet TAKE 1 TABLET BY MOUTH DAILY AS DIRECTEDBY COUMADIN CLINIC 08/23/17  Yes Hochrein, Jeneen Rinks, MD  azithromycin (ZITHROMAX) 250 MG tablet Take 2 pills on day 1.  Then take 1 pill daily on days 2-4. 10/27/17   Billie Ruddy, MD  nitroGLYCERIN (NITROSTAT) 0.4 MG SL tablet Place 1 tablet (0.4 mg total) under the tongue every 5 (five) minutes as needed for chest pain. 04/07/16   Eileen Stanford, PA-C     Allergies:    Allergies  Allergen Reactions  . Sulfa Antibiotics Other (See Comments)  Severe peeling of the skin on the groin area  . Sulfamethoxazole Other (See Comments)    REACTION: rash    Social History:   Social History   Socioeconomic History  . Marital status: Married    Spouse name: Not on file  . Number of children: 2  . Years of education: Not on file  . Highest education level: Not on file  Social Needs  . Financial resource strain: Not on file  . Food insecurity - worry: Not on file  . Food insecurity - inability: Not on file  . Transportation needs - medical: Not on file  . Transportation needs - non-medical: Not on file  Occupational History  . Occupation: Retired  Tobacco Use  . Smoking status: Never Smoker  . Smokeless tobacco: Never Used  Substance and Sexual Activity  . Alcohol use: No    Alcohol/week: 0.0 oz  . Drug use: No  . Sexual activity: Not Currently  Other Topics Concern  . Not on file  Social History Narrative  . Not on file    Family History:  The patient's family history includes Heart attack (age of onset: 82) in his father; Heart disease in his father; Ovarian cancer in his mother.    ROS:  Please see the history of present illness.  All other ROS reviewed and negative.     Physical Exam/Data:   Vitals:   11/06/17 1100 11/06/17 1121 11/06/17 1154 11/06/17 1200  BP: 118/81 118/81  128/71  Pulse: (!) 59 69  79  Resp: 14 18  11   Temp:   98 F (36.7 C)   TempSrc:   Oral   SpO2: 97% 96%  94%  Weight:        Height:       No intake or output data in the 24 hours ending 11/06/17 1236 Filed Weights   11/06/17 0935  Weight: 210 lb (95.3 kg)   Body mass index is 29.29 kg/m.  General:  Well nourished, well developed, in no acute distress HEENT: normal Lymph: no adenopathy Neck: no JVD  Endocrine:  No thryomegaly Vascular: No carotid bruits; FA pulses 2+ bilaterally without bruits  Cardiac: Irregularly irregular.  No significant murmur. Lungs: Rales in his right base that do not clear with cough. Abd: soft, nontender, no hepatomegaly  Ext: no edema Musculoskeletal:  No deformities, BUE and BLE strength normal and equal Skin: warm and dry  Neuro:  CNs 2-12 intact, no focal abnormalities noted Psych:  Normal affect     Relevant CV Studies: 06/16/2017  nuc study Study Highlights      The left ventricular ejection fraction is normal (55-65%).  Nuclear stress EF: 56%.  Blood pressure demonstrated a hypertensive response to exercise.  Horizontal ST segment depression ST segment depression of 1.5 mm was noted during stress in the II, III and V6 leads, beginning at 4 minutes of stress, and returning to baseline after 1-5 minutes of recovery.  The study is normal.  This is a low risk study.   Normal exercise nuclear stress test with no evidence for prior infarct or ischemia. There are ST depressions during exercise however no perfusion defect. Excellent exercise capacity for age. Hypertensive BP response to stress.    Cardiac cath 04/06/16 Procedures   Coronary Stent Intervention  Left Heart Cath and Coronary Angiography  Conclusion   1. Prox RCA lesion, 40% stenosed. 2. Ost 1st Diag to 1st Diag lesion, 85% stenosed. 3. Ost Ramus to Ramus lesion, 75%  stenosed. 4. Prox Cx to Mid Cx lesion, 65% stenosed. 5. Prox LAD-1 lesion, 45% stenosed. 6. Prox LAD-2 lesion, 90% stenosed. Post intervention, there is a 0% residual stenosis.    High-grade obstruction in the proximal LAD  and first and second diagonals.  Moderate mid circumflex obstruction.  Mild right coronary obstruction.  Angioplasty and stent of the proximal LAD with reduction in 90% stenosis to 0% with TIMI grade 3 flow. Medical therapy for diagonal disease. Postdilatation balloon size 4.5 mm in diameter within a [employer] DES.  Normal left ventricular function with EF of 65%  RECOMMENDATIONS:  Aspirin, Plavix, and Coumadin 1 month then dropped aspirin.  Plavix and Coumadin for at least 6 months.  IV amiodarone until conversion of atrial fibrillation.  Hopeful discharge in a.m.     Laboratory Data:  Chemistry Recent Labs  Lab 11/06/17 0938  NA 139  K 4.5  CL 103  CO2 26  GLUCOSE 174*  BUN 23*  CREATININE 1.23  CALCIUM 9.3  GFRNONAA 51*  GFRAA 59*  ANIONGAP 10    No results for input(s): PROT, ALBUMIN, AST, ALT, ALKPHOS, BILITOT in the last 168 hours. Hematology Recent Labs  Lab 11/06/17 0938  WBC 11.8*  RBC 4.50  HGB 14.2  HCT 43.8  MCV 97.3  MCH 31.6  MCHC 32.4  RDW 13.5  PLT 288   Cardiac Enzymes Recent Labs  Lab 11/06/17 0958  TROPONINI <0.03   No results for input(s): TROPIPOC in the last 168 hours.  BNPNo results for input(s): BNP, PROBNP in the last 168 hours.  DDimer No results for input(s): DDIMER in the last 168 hours.  Radiology/Studies:  Dg Chest 2 View  Result Date: 11/06/2017 CLINICAL DATA:  Atrial fibrillation. EXAM: CHEST  2 VIEW COMPARISON:  10/27/2017 FINDINGS: The heart size and mediastinal contours are within normal limits. Both lungs are clear. The visualized skeletal structures are unremarkable. IMPRESSION: No active cardiopulmonary disease. Electronically Signed   By: Kerby Moors M.D.   On: 11/06/2017 11:21    Assessment and Plan:   1.  Atrial fib : He presents with atrial fibrillation.  His heart rate is very well controlled.  He does not appear to be all that symptomatic.  I would be very surprised if he were symptomatic  since his rate is so very well controlled.  He complains of generalized fatigue.  On exam, he has rales in his right base and I am concerned that he might have a pneumonia-despite the fact that the x-ray today does not show any evidence of pneumonia.  I would like to do a CT scan of his chest to see if he in fact has pneumonia.  If he does, this pneumonia might be the ex phonation of voice feeling so poorly and not the atrial fibrillation.  From my standpoint he does not need to be admitted to the hospital.  His heart rate is well controlled and is currently therapeutic on his Coumadin.    Given the busy holiday schedule, I am not sure that we would necessarily be able to arrange for a TEE cardioversion tomorrow.  Our best step might be to have him seen in the office by 1 of our physicians assistants and arrange for an outpatient TEE cardioversion either later this week or next week.  Alternatively, since he is really asymptomatic, it may be best to anticoagulate him for 3-4 weeks and then bring him back at that time.  2.  CAD:  with neg troponin hx stent to LAD 03/2016 and normal EF 3.   Recent bradycardia with decrease in BB.  May be tachybrady-  4.   Leukocytosis - a febrile, he does have some rales on exam.  Chest CT did not show any evidence of pneumonia by my review.  Formal interpretation is pending.  For questions or updates, please contact [place] Please consult [email or web address] for contact info under Cardiology/STEMI.    Signed, Cecilie Kicks, NP  11/06/2017 12:36 PM

## 2017-11-06 NOTE — ED Notes (Signed)
Patient transported to X-ray 

## 2017-11-06 NOTE — ED Provider Notes (Signed)
[facility] EMERGENCY DEPARTMENT Provider Note   CSN: [id number] Arrival date & time: 11/06/17  3976     History   Chief Complaint Chief Complaint  Patient presents with  . Atrial Fibrillation  . Weakness    HPI [name] [name] is a 83 y.o. male.  HPI  83 year old male with a history of coronary artery disease and paroxysmal atrial fibrillation presents with A. fib.  He states that typically he will go into A. fib every 3 or 4 months and it will last no longer than about 20 hours.  However he went into it this time on 12/27 and he has remained in A. fib since.  He can feel palpitations and also gets a generalized weakness whenever this occurs.  He denies any chest pain or shortness of breath.  He otherwise has not felt ill.  On 12/27 he had his INR checked and it was 1.9 so he took 7.5 mg of warfarin that day and then went back down to his 5 mg.  Otherwise he has been compliant with all of his meds.  His biggest concern is how long he has been in the atrial fibrillation.  His cardiologist is Dr. Percival Spanish.  He had an ablation by Dr. [name] about 2 years ago  Past Medical History:  Diagnosis Date  . Coronary artery disease    a. LHC on 04/06/16 which revealed 40% occl pRCA, 85% occl D1, 75% occl Ramus, 65% occl mLCx, 45% occl pLAD, 90% occl pLAD and nl LV function. s/p PCI/DES to prox LAD. Medical therapy for diag disease.    Marland Kitchen GERD (gastroesophageal reflux disease)   . Glaucoma   . Hemorrhoids    internal  . HTN (hypertension)   . Hx of colonic polyps    tubular adenoma   . Hyperlipidemia   . Kidney stone   . Melanoma of cheek ([place])   . Nephrolithiasis   . Pancolonic diverticulosis   . Paroxysmal atrial fibrillation (HCC)    a. s/p ablation 12/2014: on coumadin    Patient Active Problem List   Diagnosis Date Noted  . Near syncope 06/10/2017  . Chest pain with moderate risk for cardiac etiology 06/10/2017  . CAD S/P percutaneous coronary angioplasty   .  Abnormal stress test   . Paroxysmal atrial fibrillation ([place]) 11/12/2014  . Acute bronchitis 10/06/2014  . Encounter for therapeutic drug monitoring 12/03/2013  . RLL pneumonia ([place]) 11/19/2013  . Palpitations 03/29/2013  . PVC's (premature ventricular contractions) 01/26/2012  . Tachycardia-bradycardia syndrome ([place]) 01/26/2012  . Long term (current) use of anticoagulants 09/06/2011  . GERD 08/01/2007  . History of colonic polyps 08/01/2007  . Personal history of urinary calculi 08/01/2007  . BPH with urinary obstruction 08/01/2007  . Hyperlipidemia 07/04/2007  . Essential hypertension 07/04/2007  . ALLERGIC RHINITIS 07/04/2007    Past Surgical History:  Procedure Laterality Date  . ATRIAL FIBRILLATION ABLATION N/A 11/12/2014   Procedure: ATRIAL FIBRILLATION ABLATION;  Surgeon: [name], MD;  Location: [facility] CATH LAB;  Service: Cardiovascular;  Laterality: N/A;  . BLADDER SURGERY     ruptured blood vessel in the bladder 3 weeks S/P ureter stones removed  . CARDIAC CATHETERIZATION N/A 04/06/2016   Procedure: Left Heart Cath and Coronary Angiography;  Surgeon: [name], MD;  Location: [facility] CV LAB;  Service: Cardiovascular;  Laterality: N/A;  . CARDIAC CATHETERIZATION N/A 04/06/2016   Procedure: Coronary Stent Intervention;  Surgeon: [name], MD;  Location: [facility]  CV LAB;  Service: Cardiovascular;  Laterality: N/A;  . CATARACT EXTRACTION W/ INTRAOCULAR LENS  IMPLANT, BILATERAL Bilateral   . COLONOSCOPY  06-29-12   per Dr. [name], adenomatous polyps, repeat in 3 yrs   . CORONARY ANGIOPLASTY WITH STENT PLACEMENT  04/06/2016   "1 stent"  . CYSTOSCOPY/RETROGRADE/URETEROSCOPY/STONE EXTRACTION WITH BASKET Left    removed 2 stones from left ureter  . INGUINAL HERNIA REPAIR Right 04/1990   Dr. [name]  . LAPAROSCOPIC CHOLECYSTECTOMY  02/1990   Dr. [name]  . MELANOMA EXCISION Right    "@ cheek near ear"  . TEE WITHOUT CARDIOVERSION N/A 11/11/2014   Procedure:  TRANSESOPHAGEAL ECHOCARDIOGRAM (TEE);  Surgeon: [name], MD;  Location: [facility] ENDOSCOPY;  Service: Cardiovascular;  Laterality: N/A;  needs INR before case  . VASECTOMY         Home Medications    Prior to Admission medications   Medication Sig Start Date End Date Taking? Authorizing Provider  acetaminophen (TYLENOL) 325 MG tablet Take 325 mg by mouth 2 (two) times daily.    Yes [provider]  atorvastatin (LIPITOR) 80 MG tablet Take 1 tablet (80 mg total) by mouth daily. Patient taking differently: Take 80 mg by mouth at bedtime.  04/19/16  Yes Eileen Stanford, PA-C  cetirizine (ZYRTEC) 10 MG tablet Take 10 mg by mouth every morning.    Yes [provider]  diltiazem (CARDIZEM) 30 MG tablet Take 1 tablet every 4 hours AS NEEDED for afib heart rate over 100 08/08/17  Yes Sherran Needs, NP  finasteride (PROSCAR) 5 MG tablet Take 5 mg daily by mouth.   Yes [provider]  HYDROcodone-homatropine (HYCODAN) 5-1.5 MG/5ML syrup Take 5 mLs by mouth every 8 (eight) hours as needed for cough. 10/27/17  Yes Billie Ruddy, MD  latanoprost (XALATAN) 0.005 % ophthalmic solution Place 1 drop into both eyes at bedtime.    Yes [provider]  lisinopril (PRINIVIL,ZESTRIL) 10 MG tablet Take 10 mg by mouth at bedtime.    Yes [provider]  loratadine (CLARITIN) 10 MG tablet Take 10 mg by mouth at bedtime.    Yes [provider]  metoprolol tartrate (LOPRESSOR) 50 MG tablet Take 25 mg by mouth 2 (two) times daily.    Yes [provider]  mometasone (NASONEX) 50 MCG/ACT nasal spray Place 2 sprays into both nostrils at bedtime.    Yes [provider]  Multiple Vitamins-Minerals (CENTRUM SILVER PO) Take 1 tablet by mouth every morning.    Yes [provider]  pantoprazole (PROTONIX) 40 MG tablet Take 1 tablet (40 mg total) by mouth daily. 04/19/16  Yes Eileen Stanford, PA-C  potassium chloride (KLOR-CON) 10 MEQ  CR tablet Take 10 mEq by mouth every morning.    Yes [provider]  tamsulosin (FLOMAX) 0.4 MG CAPS Take 0.4 mg by mouth 2 (two) times daily.    Yes [provider]  warfarin (COUMADIN) 5 MG tablet TAKE 1 TABLET BY MOUTH DAILY AS DIRECTEDBY COUMADIN CLINIC 08/23/17  Yes Hochrein, Jeneen Rinks, MD  azithromycin (ZITHROMAX) 250 MG tablet Take 2 pills on day 1.  Then take 1 pill daily on days 2-4. 10/27/17   Billie Ruddy, MD  nitroGLYCERIN (NITROSTAT) 0.4 MG SL tablet Place 1 tablet (0.4 mg total) under the tongue every 5 (five) minutes as needed for chest pain. 04/07/16   Eileen Stanford, PA-C    Family History Family History  Problem Relation Age  of Onset  . Heart attack Father 16  . Heart disease Father   . Ovarian cancer Mother     Social History Social History   Tobacco Use  . Smoking status: Never Smoker  . Smokeless tobacco: Never Used  Substance Use Topics  . Alcohol use: No    Alcohol/week: 0.0 oz  . Drug use: No     Allergies   Sulfa antibiotics and Sulfamethoxazole   Review of Systems Review of Systems  Constitutional: Positive for fatigue.  Respiratory: Negative for shortness of breath.   Cardiovascular: Positive for palpitations. Negative for chest pain.  Gastrointestinal: Negative for vomiting.  All other systems reviewed and are negative.    Physical Exam Updated Vital Signs BP 119/81   Pulse 85   Temp 98 F (36.7 C) (Oral)   Resp 13   Ht 5\' 11"  (1.803 m)   Wt 95.3 kg (210 lb)   SpO2 97%   BMI 29.29 kg/m   Physical Exam  Constitutional: He is oriented to person, place, and time. He appears well-developed and well-nourished. No distress.  HENT:  Head: Normocephalic and atraumatic.  Right Ear: External ear normal.  Left Ear: External ear normal.  Nose: Nose normal.  Eyes: Right eye exhibits no discharge. Left eye exhibits no discharge.  Neck: Neck supple.  Cardiovascular: Normal rate and normal heart sounds. An irregular  rhythm present.  Pulses:      Radial pulses are 2+ on the right side, and 2+ on the left side.  Pulmonary/Chest: Effort normal and breath sounds normal. He has no wheezes. He has no rales.  Abdominal: Soft. There is no tenderness.  Musculoskeletal: He exhibits no edema.  Neurological: He is alert and oriented to person, place, and time.  Skin: Skin is warm and dry. He is not diaphoretic.  Nursing note and vitals reviewed.    ED Treatments / Results  Labs (all labs ordered are listed, but only abnormal results are displayed) Labs Reviewed  BASIC METABOLIC PANEL - Abnormal; Notable for the following components:      Result Value   Glucose, Bld 174 (*)    BUN 23 (*)    GFR calc non Af Amer 51 (*)    GFR calc Af Amer 59 (*)    All other components within normal limits  CBC - Abnormal; Notable for the following components:   WBC 11.8 (*)    All other components within normal limits  PROTIME-INR - Abnormal; Notable for the following components:   Prothrombin Time 27.5 (*)    All other components within normal limits  TROPONIN I    EKG  EKG Interpretation  Date/Time:  Sunday November 06 2017 09:25:49 EST Ventricular Rate:  97 PR Interval:    QRS Duration: 82 QT Interval:  324 QTC Calculation: 411 R Axis:   19 Text Interpretation:  Atrial fibrillation Anterior infarct , age undetermined Abnormal ECG afib has replaced sinus rhythm compared to Oct 2018 Confirmed by Sherwood Gambler [phone]) on 11/06/2017 9:45:36 AM Also confirmed by Sherwood Gambler [phone]), editor Philomena Doheny [phone])  on 11/06/2017 9:51:40 AM       Radiology Dg Chest 2 View  Result Date: 11/06/2017 CLINICAL DATA:  Atrial fibrillation. EXAM: CHEST  2 VIEW COMPARISON:  10/27/2017 FINDINGS: The heart size and mediastinal contours are within normal limits. Both lungs are clear. The visualized skeletal structures are unremarkable. IMPRESSION: No active cardiopulmonary disease. Electronically Signed   By: Kerby Moors M.D.   On:  11/06/2017 11:21   Ct Chest W Contrast  Result Date: 11/06/2017 CLINICAL DATA:  Pneumonia. Unresolved or complicated. Atrial fibrillation. EXAM: CT CHEST WITH CONTRAST TECHNIQUE: Multidetector CT imaging of the chest was performed during intravenous contrast administration. CONTRAST:  45mL ISOVUE-300 IOPAMIDOL (ISOVUE-300) INJECTION 61% COMPARISON:  11/06/2017 plain film. No comparison chest CT. 09/20/2013 abdominopelvic CT reviewed. FINDINGS: Cardiovascular: Aortic atherosclerosis. Normal heart size, without pericardial effusion. Multivessel coronary artery atherosclerosis. No central pulmonary embolism, on this non-dedicated study. Mediastinum/Nodes: No mediastinal or hilar adenopathy. Lungs/Pleura: No pleural fluid. Minimal subsegmental atelectasis at the lung bases. Upper Abdomen: Cholecystectomy. Normal imaged portions of the liver, spleen, pancreas, adrenal glands, right kidney. Incompletely imaged left renal lesion most consistent with a cyst back in 2014. On the order of 2.4 cm. 6 mm left renal collecting system calculus. Proximal gastric underdistention. Musculoskeletal: Mild bilateral gynecomastia. Moderate thoracic spondylosis. IMPRESSION: 1.  No acute process in the chest. 2. Coronary artery atherosclerosis. Aortic Atherosclerosis (ICD10-I70.0). 3. Bilateral gynecomastia. 4. Left nephrolithiasis. Electronically Signed   By: Abigail Miyamoto M.D.   On: 11/06/2017 16:28    Procedures Procedures (including critical care time)  Medications Ordered in ED Medications  sodium chloride 0.9 % bolus 1,000 mL (0 mLs Intravenous Stopped 11/06/17 1416)  iopamidol (ISOVUE-300) 61 % injection (75 mLs  Contrast Given 11/06/17 1557)     Initial Impression / Assessment and Plan / ED Course  I have reviewed the triage vital signs and the nursing notes.  Pertinent labs & imaging results that were available during my care of the patient were reviewed by me and considered in my medical  decision making (see chart for details).  Clinical Course as of Nov 06 1641  Sun Nov 06, 2017  1204 Patient remains in Afib, but rate controlled. D/w Cards, Dr. Acie Fredrickson, can't cardiovert at this time since INR was slightly low (1.9), 3 days ago. Cards will come eval  [SG]  1420 Dr. Acie Fredrickson asks for CT due to concern for PNA. Patient has gotten over a respiratory illness, still with mild cough, recently finished zpack. Has R rales. CT to help see if PNA, as this would change management and could be cause of afib.  [SG]    Clinical Course User Index [SG] Sherwood Gambler, MD    Patient has remained in A. fib but his rate has been controlled.  Cardiology has come to evaluate patient T scan as above based on concern for possible pneumonia not seen on chest x-ray.  However CT does not show any obvious acute infection.  The patient has no shortness of breath or tachypnea or hypoxia.  Thus, cardiology recommends discharge and outpatient follow-up for cardioversion given that he is not in RVR and does not appear to have acute ischemia or other acute emergent condition from the A. fib.  Discharge home with return precautions.  Final Clinical Impressions(s) / ED Diagnoses   Final diagnoses:  Paroxysmal atrial fibrillation [facility])    ED Discharge Orders    None       Sherwood Gambler, MD 11/06/17 [phone]

## 2017-11-06 NOTE — ED Notes (Signed)
Patient transported to CT 

## 2017-11-07 ENCOUNTER — Telehealth (HOSPITAL_COMMUNITY): Payer: Self-pay | Admitting: *Deleted

## 2017-11-07 NOTE — Telephone Encounter (Signed)
Pt was seen in ED for afib.  Pt cld clinic to discuss with Roderic Palau, NP who was out of clinic.  I offered the patient an afib follow up appt since he is not scheduled to see Hochrein until Feb.  Pt declined and stated he would call Dr. Cherlyn Cushing office for sooner appt

## 2017-11-08 ENCOUNTER — Encounter: Payer: Self-pay | Admitting: Cardiology

## 2017-12-01 ENCOUNTER — Ambulatory Visit (INDEPENDENT_AMBULATORY_CARE_PROVIDER_SITE_OTHER): Payer: Medicare Other | Admitting: Internal Medicine

## 2017-12-01 ENCOUNTER — Encounter: Payer: Self-pay | Admitting: Internal Medicine

## 2017-12-01 VITALS — BP 124/62 | HR 60 | Ht 71.0 in | Wt 209.0 lb

## 2017-12-01 DIAGNOSIS — I480 Paroxysmal atrial fibrillation: Secondary | ICD-10-CM

## 2017-12-01 DIAGNOSIS — I10 Essential (primary) hypertension: Secondary | ICD-10-CM

## 2017-12-01 DIAGNOSIS — Z7901 Long term (current) use of anticoagulants: Secondary | ICD-10-CM | POA: Diagnosis not present

## 2017-12-01 DIAGNOSIS — I2510 Atherosclerotic heart disease of native coronary artery without angina pectoris: Secondary | ICD-10-CM | POA: Diagnosis not present

## 2017-12-01 NOTE — Progress Notes (Signed)
PCP: [name], MD Primary Cardiologist: Dr Percival Spanish Primary EP: Dr Valda Lamb is a 84 y.o. male who presents today for routine electrophysiology followup.  Since last being seen in our clinic, the patient reports doing very well.  He recently presented to [facility] ED with afib (note reviewed).  He has had increasing frequency and duration of afib episodes since I saw him last.  Currently episodes occur about once per month.  He has palpitations and fatigue with afib.  He is unaware of triggers.  Today, he denies symptoms of palpitations, chest pain, shortness of breath,  lower extremity edema, dizziness, presyncope, or syncope.  The patient is otherwise without complaint today.   Past Medical History:  Diagnosis Date  . Coronary artery disease    a. LHC on 04/06/16 which revealed 40% occl pRCA, 85% occl D1, 75% occl Ramus, 65% occl mLCx, 45% occl pLAD, 90% occl pLAD and nl LV function. s/p PCI/DES to prox LAD. Medical therapy for diag disease.    Marland Kitchen GERD (gastroesophageal reflux disease)   . Glaucoma   . Hemorrhoids    internal  . HTN (hypertension)   . Hx of colonic polyps    tubular adenoma   . Hyperlipidemia   . Kidney stone   . Melanoma of cheek ([place])   . Nephrolithiasis   . Pancolonic diverticulosis   . Paroxysmal atrial fibrillation (HCC)    a. s/p ablation 12/2014: on coumadin   Past Surgical History:  Procedure Laterality Date  . ATRIAL FIBRILLATION ABLATION N/A 11/12/2014   Procedure: ATRIAL FIBRILLATION ABLATION;  Surgeon: [name], MD;  Location[facility] CATH LAB;  Service: Cardiovascular;  Laterality: N/A;  . BLADDER SURGERY     ruptured blood vessel in the bladder 3 weeks S/P ureter stones removed  . CARDIAC CATHETERIZATION N/A 04/06/2016   Procedure: Left Heart Cath and Coronary Angiography;  Surgeon: [name], MD;  Location: [facility] CV LAB;  Service: Cardiovascular;  Laterality: N/A;  . CARDIAC CATHETERIZATION N/A 04/06/2016   Procedure: Coronary  Stent Intervention;  Surgeon: [name], MD;  Location: [place] CV LAB;  Service: Cardiovascular;  Laterality: N/A;  . CATARACT EXTRACTION W/ INTRAOCULAR LENS  IMPLANT, BILATERAL Bilateral   . COLONOSCOPY  06-29-12   per Dr. [name], adenomatous polyps, repeat in 3 yrs   . CORONARY ANGIOPLASTY WITH STENT PLACEMENT  04/06/2016   "1 stent"  . CYSTOSCOPY/RETROGRADE/URETEROSCOPY/STONE EXTRACTION WITH BASKET Left    removed 2 stones from left ureter  . INGUINAL HERNIA REPAIR Right 04/1990   Dr. [name]  . LAPAROSCOPIC CHOLECYSTECTOMY  02/1990   Dr. [name]  . MELANOMA EXCISION Right    "@ cheek near ear"  . TEE WITHOUT CARDIOVERSION N/A 11/11/2014   Procedure: TRANSESOPHAGEAL ECHOCARDIOGRAM (TEE);  Surgeon: [name], MD;  Location: [facility] ENDOSCOPY;  Service: Cardiovascular;  Laterality: N/A;  needs INR before case  . VASECTOMY      ROS- all systems are reviewed and negatives except as per HPI above  Current Outpatient Medications  Medication Sig Dispense Refill  . acetaminophen (TYLENOL) 325 MG tablet Take 325 mg by mouth 2 (two) times daily.     Marland Kitchen atorvastatin (LIPITOR) 80 MG tablet Take 1 tablet (80 mg total) by mouth daily. (Patient taking differently: Take 80 mg by mouth at bedtime. ) 90 tablet 3  . cetirizine (ZYRTEC) 10 MG tablet Take 10 mg by mouth every morning.     . diltiazem (CARDIZEM)  30 MG tablet Take 1 tablet every 4 hours AS NEEDED for afib heart rate over 100 45 tablet 1  . finasteride (PROSCAR) 5 MG tablet Take 5 mg daily by mouth.    Marland Kitchen HYDROcodone-homatropine (HYCODAN) 5-1.5 MG/5ML syrup Take 5 mLs by mouth every 8 (eight) hours as needed for cough. 240 mL 0  . latanoprost (XALATAN) 0.005 % ophthalmic solution Place 1 drop into both eyes at bedtime.     Marland Kitchen lisinopril (PRINIVIL,ZESTRIL) 10 MG tablet Take 10 mg by mouth at bedtime.     Marland Kitchen loratadine (CLARITIN) 10 MG tablet Take 10 mg by mouth at bedtime.     . metoprolol tartrate (LOPRESSOR) 50 MG tablet Take 25 mg  by mouth 2 (two) times daily.     . mometasone (NASONEX) 50 MCG/ACT nasal spray Place 2 sprays into both nostrils at bedtime.     . Multiple Vitamins-Minerals (CENTRUM SILVER PO) Take 1 tablet by mouth every morning.     . nitroGLYCERIN (NITROSTAT) 0.4 MG SL tablet Place 1 tablet (0.4 mg total) under the tongue every 5 (five) minutes as needed for chest pain. 30 tablet 12  . pantoprazole (PROTONIX) 40 MG tablet Take 1 tablet (40 mg total) by mouth daily. 90 tablet 3  . potassium chloride (KLOR-CON) 10 MEQ CR tablet Take 10 mEq by mouth every morning.     . tamsulosin (FLOMAX) 0.4 MG CAPS Take 0.4 mg by mouth 2 (two) times daily.     Marland Kitchen warfarin (COUMADIN) 5 MG tablet TAKE 1 TABLET BY MOUTH DAILY AS DIRECTEDBY COUMADIN CLINIC 90 tablet 1   No current facility-administered medications for this visit.     Physical Exam: Vitals:   12/01/17 1133  BP: 124/62  Pulse: 60  Weight: 209 lb (94.8 kg)  Height: 5\' 11"  (1.803 m)    GEN- The patient is well appearing, alert and oriented x 3 today.   Head- normocephalic, atraumatic Eyes-  Sclera clear, conjunctiva pink Ears- hearing intact Oropharynx- clear Lungs- Clear to ausculation bilaterally, normal work of breathing Heart- Regular rate and rhythm, no murmurs, rubs or gallops, PMI not laterally displaced GI- soft, NT, ND, + BS Extremities- no clubbing, cyanosis, or edema  EKG tracing ordered today is personally reviewed and shows sinus rhythm 60 bpm, otherwise normal ekg  Assessment and Plan:  1. Paroxysmal atrial fibrillation Therapeutic strategies for afib including medicine (multaq, tikosyn, amiodarone) and ablation were discussed in detail with the patient today. Risk, benefits, and alternatives to EP study and radiofrequency ablation for afib were also discussed in detail today.  He is considering amiodarone as his most likely option.  He is not interested in hospitalization for tikosyn and worries about costs of multaq (though he does  get his medicines through the [place]).  He is reluctant to reconsider ablation given prior groin hematoma with his prior procedure.  I did inform him that his prior hematoma is not a usual complication and that my expectation was that he would do just fine. He will continue to think about his options and discuss with Dr Percival Spanish when he sees him in March.  He will contact my office if he wishes to proceed with ablation in the interim. I have encouraged him to call the AF clinic rather than go to the ED with AF episodes. chads2vasc score is at least 4.  He is on coumadin  2. CAD No ischemic symptoms No changes  3. HTN Stable No change required today  Follow-up with Dr  Hochrein in march as scheduled. I will see him 6 months after that unless he is having additional issues  [name] MD, [facility] 12/01/2017 12:02 PM

## 2017-12-01 NOTE — Patient Instructions (Addendum)
Medication Instructions:  Your physician recommends that you continue on your current medications as directed. Please refer to the Current Medication list given to you today.  Labwork: None ordered.  Testing/Procedures: None ordered.  Follow-Up: Your physician wants you to follow-up in: September with [name]  Any Other Special Instructions Will Be Listed Below (If Applicable).     If you need a refill on your cardiac medications before your next appointment, please call your pharmacy.

## 2017-12-08 ENCOUNTER — Ambulatory Visit (INDEPENDENT_AMBULATORY_CARE_PROVIDER_SITE_OTHER): Payer: Medicare Other

## 2017-12-08 DIAGNOSIS — Z5181 Encounter for therapeutic drug level monitoring: Secondary | ICD-10-CM | POA: Diagnosis not present

## 2017-12-08 DIAGNOSIS — I480 Paroxysmal atrial fibrillation: Secondary | ICD-10-CM | POA: Diagnosis not present

## 2017-12-08 LAB — POCT INR: INR: 2.4

## 2017-12-08 NOTE — Patient Instructions (Signed)
Description   Continue on same dosage 5mg s daily.  Recheck in 6 weeks. Call with any questions or concerns [phone]

## 2017-12-19 IMAGING — DX DG CHEST 2V
2 series · 2 of 2 positions shown · non-contrast
Comparison: 06/03/2017

CLINICAL DATA: Atrial fibrillation

EXAM:
CHEST  2 VIEW

[chest pa]
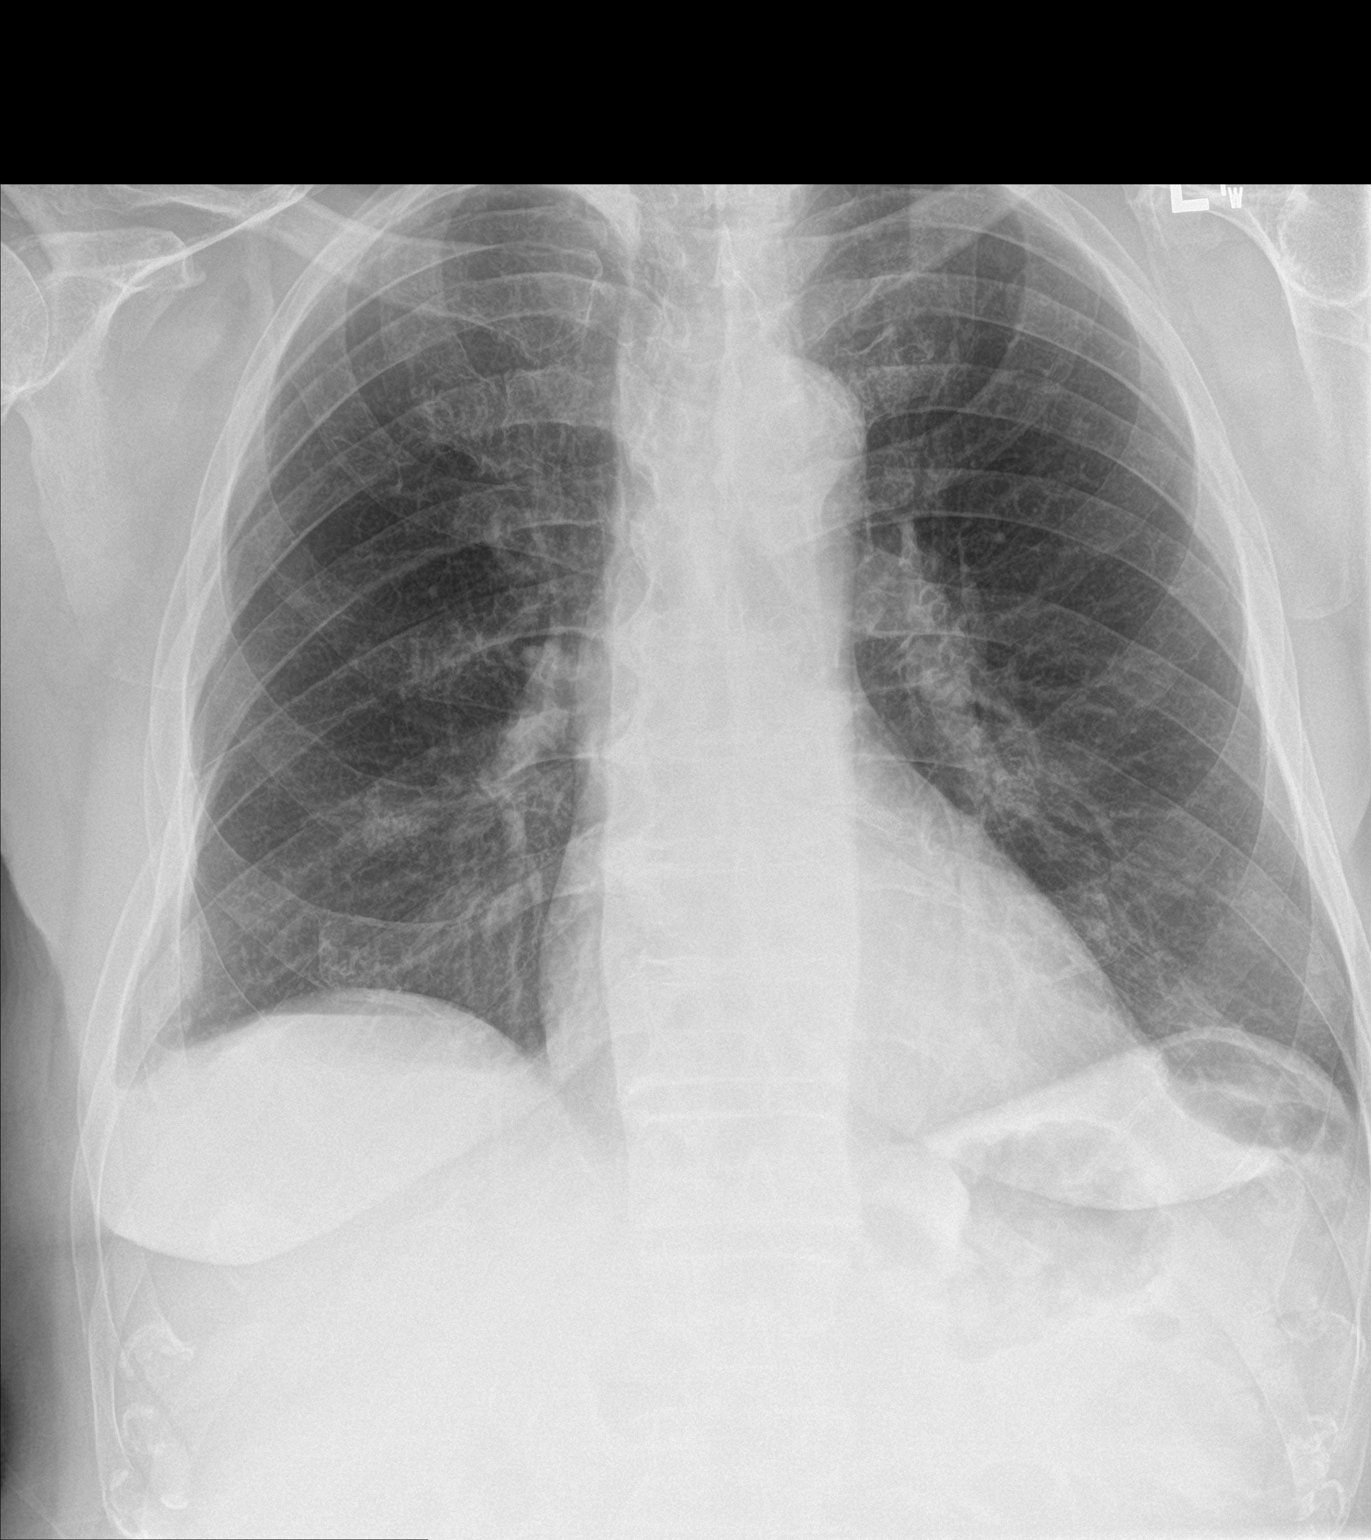

[chest lat]
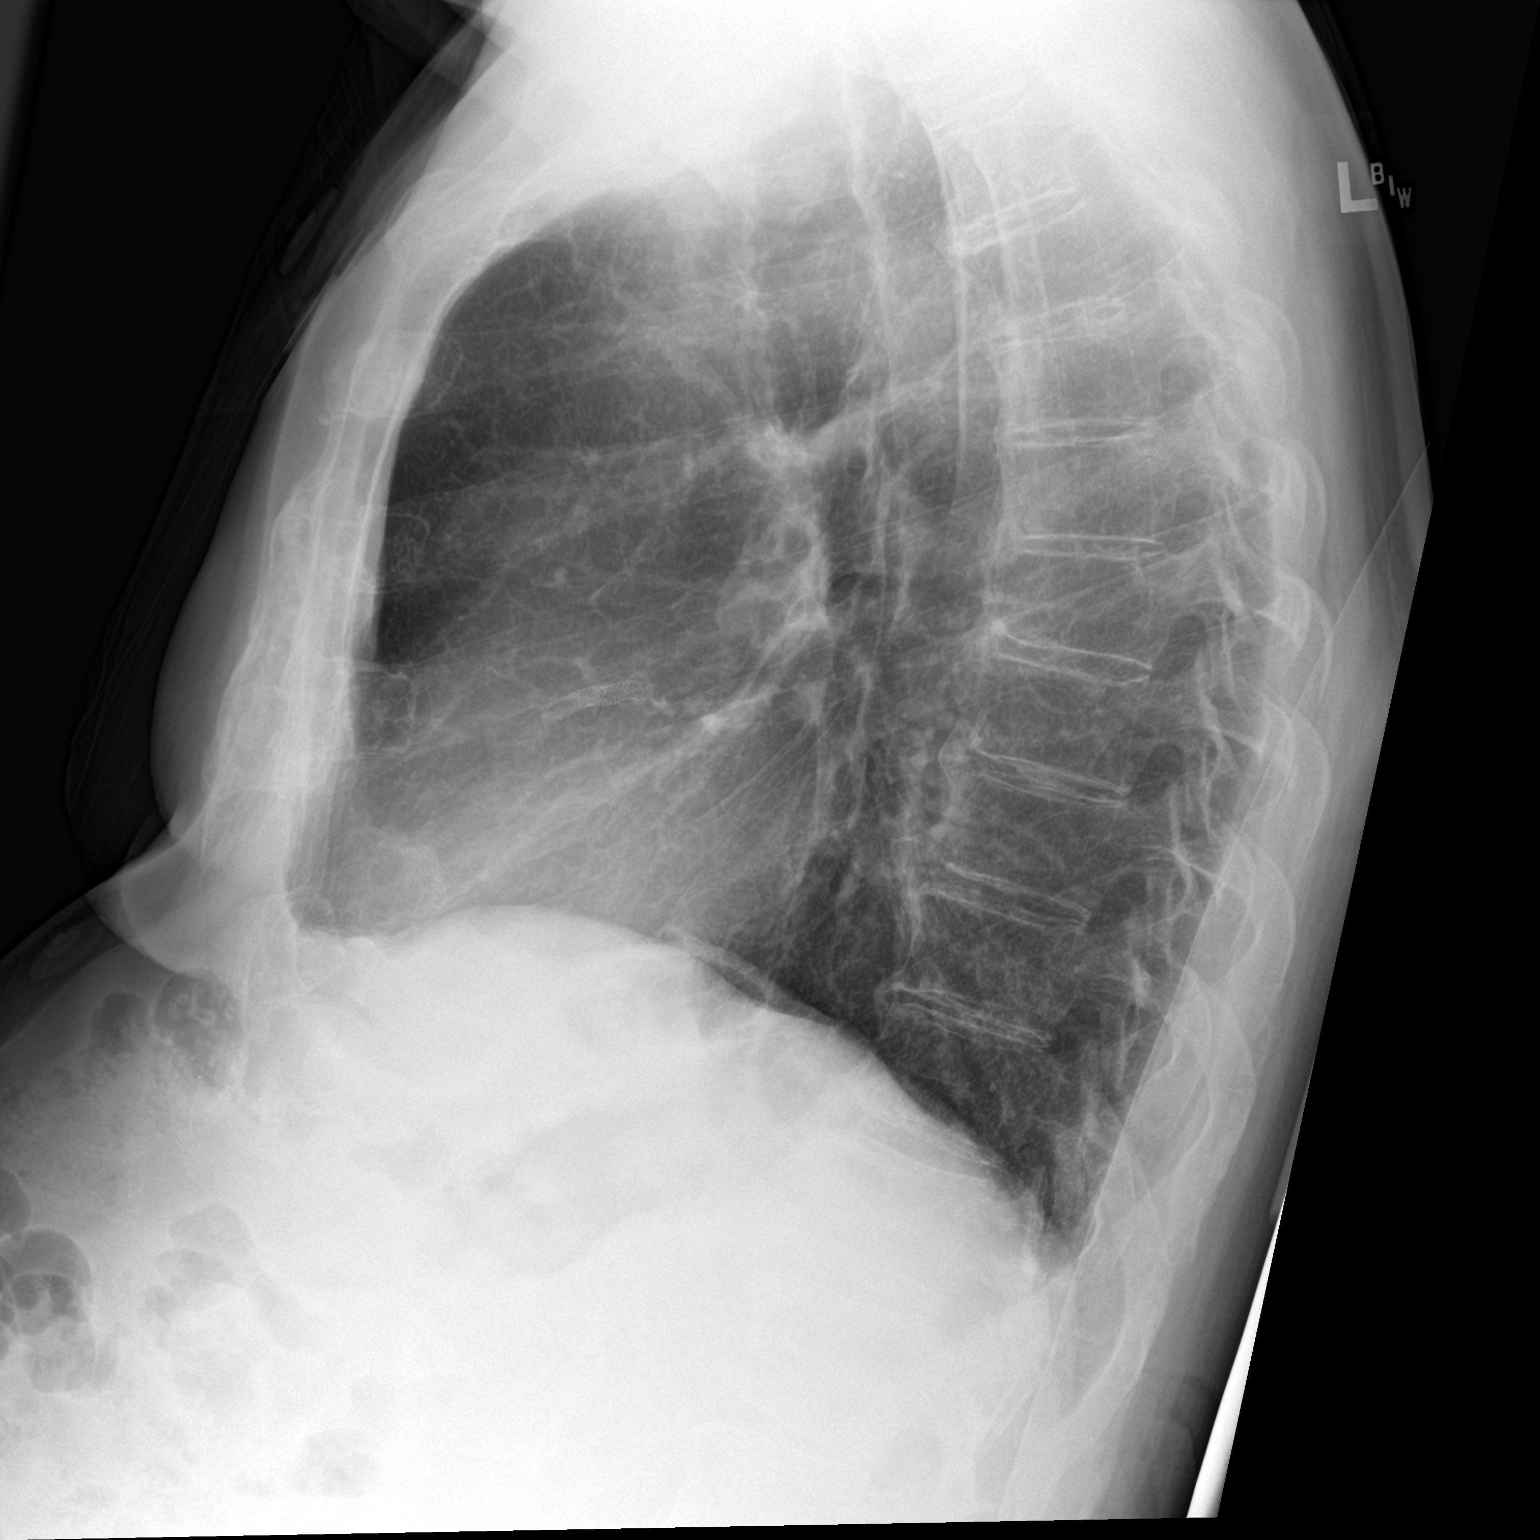

[2 of 2 positions shown; findings below may reference images not displayed]

FINDINGS: Cardiac shadow is within normal limits. The lungs are well aerated
bilaterally. No focal infiltrate or sizable effusion is seen.
Degenerative changes of the thoracic spine are noted.
IMPRESSION: No active cardiopulmonary disease.

## 2017-12-25 ENCOUNTER — Encounter: Payer: Self-pay | Admitting: Family Medicine

## 2017-12-26 NOTE — Telephone Encounter (Signed)
We will call in Cipro for 10 days

## 2017-12-28 DIAGNOSIS — L821 Other seborrheic keratosis: Secondary | ICD-10-CM | POA: Diagnosis not present

## 2017-12-28 DIAGNOSIS — D229 Melanocytic nevi, unspecified: Secondary | ICD-10-CM | POA: Diagnosis not present

## 2017-12-28 DIAGNOSIS — D1801 Hemangioma of skin and subcutaneous tissue: Secondary | ICD-10-CM | POA: Diagnosis not present

## 2017-12-28 DIAGNOSIS — L570 Actinic keratosis: Secondary | ICD-10-CM | POA: Diagnosis not present

## 2017-12-28 DIAGNOSIS — Z85820 Personal history of malignant melanoma of skin: Secondary | ICD-10-CM | POA: Diagnosis not present

## 2018-01-09 DIAGNOSIS — H401132 Primary open-angle glaucoma, bilateral, moderate stage: Secondary | ICD-10-CM | POA: Diagnosis not present

## 2018-01-16 ENCOUNTER — Telehealth: Payer: Self-pay | Admitting: Cardiology

## 2018-01-16 NOTE — Telephone Encounter (Signed)
Closed Encounter  °

## 2018-01-19 ENCOUNTER — Ambulatory Visit (INDEPENDENT_AMBULATORY_CARE_PROVIDER_SITE_OTHER): Payer: Medicare Other | Admitting: *Deleted

## 2018-01-19 ENCOUNTER — Encounter: Payer: Self-pay | Admitting: Family Medicine

## 2018-01-19 DIAGNOSIS — I480 Paroxysmal atrial fibrillation: Secondary | ICD-10-CM

## 2018-01-19 DIAGNOSIS — Z5181 Encounter for therapeutic drug level monitoring: Secondary | ICD-10-CM | POA: Diagnosis not present

## 2018-01-19 LAB — POCT INR: INR: 2.5

## 2018-01-19 NOTE — Patient Instructions (Signed)
Description   Continue on same dosage 5mg s daily.  Recheck in 6 weeks. Call with any questions or concerns [phone]

## 2018-01-23 ENCOUNTER — Ambulatory Visit: Payer: Medicare Other | Admitting: Cardiology

## 2018-01-23 NOTE — Telephone Encounter (Signed)
Noted  

## 2018-01-25 ENCOUNTER — Encounter: Payer: Self-pay | Admitting: Cardiology

## 2018-02-01 NOTE — Progress Notes (Signed)
Cardiology Office Note   Date:  02/02/2018   ID:  [name] [name], DOB 1929/08/21, MRN [id number]  PCP:  [name], MD  Cardiologist:   [name], MD    Chief Complaint  Patient presents with  . Coronary Artery Disease      History of Present Illness: [name] [name] is a 84 y.o. male who presents for follow up of PAF.  He was in the ED on late December with PAF. He saw [name] and is considering ablation vs amiodarone.   Since that appointment and the episode in January he has had no further fibrillation.  He denies any palpitations, presyncope or syncope.  He is had no chest pressure, neck or arm discomfort.  He has had no weight gain or new edema.   He is fatigued.  He is not sleeping well because he will not let his wife go up and down stairs by herself.  The bedroom is upstairs and she does not go to bed until after MN.    Past Medical History:  Diagnosis Date  . Coronary artery disease    a. LHC on 04/06/16 which revealed 40% occl pRCA, 85% occl D1, 75% occl Ramus, 65% occl mLCx, 45% occl pLAD, 90% occl pLAD and nl LV function. s/p PCI/DES to prox LAD. Medical therapy for diag disease.    Marland Kitchen GERD (gastroesophageal reflux disease)   . Glaucoma   . Hemorrhoids    internal  . HTN (hypertension)   . Hx of colonic polyps    tubular adenoma   . Hyperlipidemia   . Kidney stone   . Melanoma of cheek ([place])   . Nephrolithiasis   . Pancolonic diverticulosis   . Paroxysmal atrial fibrillation (HCC)    a. s/p ablation 12/2014: on coumadin    Past Surgical History:  Procedure Laterality Date  . ATRIAL FIBRILLATION ABLATION N/A 11/12/2014   Procedure: ATRIAL FIBRILLATION ABLATION;  Surgeon: [name], MD;  Location: [facility] CATH LAB;  Service: Cardiovascular;  Laterality: N/A;  . BLADDER SURGERY     ruptured blood vessel in the bladder 3 weeks S/P ureter stones removed  . CARDIAC CATHETERIZATION N/A 04/06/2016   Procedure: Left Heart Cath and Coronary Angiography;  Surgeon:  [name], MD;  Location: [place] CV LAB;  Service: Cardiovascular;  Laterality: N/A;  . CARDIAC CATHETERIZATION N/A 04/06/2016   Procedure: Coronary Stent Intervention;  Surgeon: [name], MD;  Location: [place] CV LAB;  Service: Cardiovascular;  Laterality: N/A;  . CATARACT EXTRACTION W/ INTRAOCULAR LENS  IMPLANT, BILATERAL Bilateral   . COLONOSCOPY  06-29-12   per Dr. [name], adenomatous polyps, repeat in 3 yrs   . CORONARY ANGIOPLASTY WITH STENT PLACEMENT  04/06/2016   "1 stent"  . CYSTOSCOPY/RETROGRADE/URETEROSCOPY/STONE EXTRACTION WITH BASKET Left    removed 2 stones from left ureter  . INGUINAL HERNIA REPAIR Right 04/1990   Dr. [name]  . LAPAROSCOPIC CHOLECYSTECTOMY  02/1990   Dr. [name]  . MELANOMA EXCISION Right    "@ cheek near ear"  . TEE WITHOUT CARDIOVERSION N/A 11/11/2014   Procedure: TRANSESOPHAGEAL ECHOCARDIOGRAM (TEE);  Surgeon: [name], MD;  Location: [facility] ENDOSCOPY;  Service: Cardiovascular;  Laterality: N/A;  needs INR before case  . VASECTOMY       Current Outpatient Medications  Medication Sig Dispense Refill  . acetaminophen (TYLENOL) 325 MG tablet Take 325 mg by mouth 2 (two) times daily.     Marland Kitchen atorvastatin (  LIPITOR) 80 MG tablet Take 1 tablet (80 mg total) by mouth daily. (Patient taking differently: Take 80 mg by mouth at bedtime. ) 90 tablet 3  . cetirizine (ZYRTEC) 10 MG tablet Take 10 mg by mouth every morning.     . diltiazem (CARDIZEM) 30 MG tablet Take 1 tablet every 4 hours AS NEEDED for afib heart rate over 100 45 tablet 1  . finasteride (PROSCAR) 5 MG tablet Take 5 mg daily by mouth.    Marland Kitchen HYDROcodone-homatropine (HYCODAN) 5-1.5 MG/5ML syrup Take 5 mLs by mouth every 8 (eight) hours as needed for cough. 240 mL 0  . latanoprost (XALATAN) 0.005 % ophthalmic solution Place 1 drop into both eyes at bedtime.     Marland Kitchen lisinopril (PRINIVIL,ZESTRIL) 10 MG tablet Take 10 mg by mouth at bedtime.     Marland Kitchen loratadine (CLARITIN) 10 MG tablet Take  10 mg by mouth at bedtime.     . metoprolol tartrate (LOPRESSOR) 50 MG tablet Take 25 mg by mouth 2 (two) times daily.     . mometasone (NASONEX) 50 MCG/ACT nasal spray Place 2 sprays into both nostrils at bedtime.     . Multiple Vitamins-Minerals (CENTRUM SILVER PO) Take 1 tablet by mouth every morning.     . nitroGLYCERIN (NITROSTAT) 0.4 MG SL tablet Place 1 tablet (0.4 mg total) under the tongue every 5 (five) minutes as needed for chest pain. 30 tablet 12  . pantoprazole (PROTONIX) 40 MG tablet Take 1 tablet (40 mg total) by mouth daily. 90 tablet 3  . potassium chloride (KLOR-CON) 10 MEQ CR tablet Take 10 mEq by mouth every morning.     . tamsulosin (FLOMAX) 0.4 MG CAPS Take 0.4 mg by mouth 2 (two) times daily.     Marland Kitchen warfarin (COUMADIN) 5 MG tablet TAKE 1 TABLET BY MOUTH DAILY AS DIRECTEDBY COUMADIN CLINIC 90 tablet 1   No current facility-administered medications for this visit.     Allergies:   Sulfa antibiotics and Sulfamethoxazole     ROS:  Please see the history of present illness.   Otherwise, review of systems are positive for none.   All other systems are reviewed and negative.    PHYSICAL EXAM: VS:  BP 137/71   Pulse 64   Ht 5' 11.5" (1.816 m)   Wt 213 lb 12.8 oz (97 kg)   SpO2 96%   BMI 29.40 kg/m  , BMI Body mass index is 29.4 kg/m.  GENERAL:  Well appearing NECK:  No jugular venous distention, waveform within normal limits, carotid upstroke brisk and symmetric, no bruits, no thyromegaly LUNGS:  Clear to auscultation bilaterally CHEST:  Unremarkable HEART:  PMI not displaced or sustained,S1 and S2 within normal limits, no S3, no S4, no clicks, no rubs, no murmurs ABD:  Flat, positive bowel sounds normal in frequency in pitch, no bruits, no rebound, no guarding, no midline pulsatile mass, no hepatomegaly, no splenomegaly EXT:  2 plus pulses throughout, mild edema, no cyanosis no clubbing   EKG:  EKG is not ordered today.   Recent Labs: 08/26/2017: ALT 25; TSH  1.36 11/06/2017: BUN 23; Creatinine, Ser 1.23; Hemoglobin 14.2; Platelets 288; Potassium 4.5; Sodium 139    Lipid Panel    Component Value Date/Time   CHOL 127 08/26/2017 1008   TRIG 124.0 08/26/2017 1008   HDL 41.00 08/26/2017 1008   CHOLHDL 3 08/26/2017 1008   VLDL 24.8 08/26/2017 1008   LDLCALC 61 08/26/2017 1008      Wt Readings  from Last 3 Encounters:  02/02/18 213 lb 12.8 oz (97 kg)  12/01/17 209 lb (94.8 kg)  11/06/17 210 lb (95.3 kg)      Other studies Reviewed: Additional studies/ records that were reviewed today include: EP note. Review of the above records demonstrates:     ASSESSMENT AND PLAN:   CAD:   The patient has no new sypmtoms.  No further cardiovascular testing is indicated.  We will continue with aggressive risk reduction and meds as listed.  ATRIAL FIB:   [name] has a CHA2DS2 - VASc score of 3.   No change in therapy.  He has had no events since Dec.  He will likely choose amiodarone if he has a recurrent palpitations.   HTN:  The blood pressure is at target. No change in medications is indicated. We will continue with therapeutic lifestyle changes (TLC).  I reviewed his BP diary.   FATIGUE:  He is not getting enough sleep as he is caring for his wife.    Current medicines are reviewed at length with the patient today.  The patient does not have concerns regarding medicines.  The following changes have been made:  None  Labs/ tests ordered today include: None  No orders of the defined types were placed in this encounter.    Disposition:   FU with me in 6 months.      Signed, [name], MD  02/02/2018 3:29 PM    [facility]

## 2018-02-02 ENCOUNTER — Encounter: Payer: Self-pay | Admitting: Cardiology

## 2018-02-02 ENCOUNTER — Ambulatory Visit (INDEPENDENT_AMBULATORY_CARE_PROVIDER_SITE_OTHER): Payer: Medicare Other | Admitting: Cardiology

## 2018-02-02 VITALS — BP 137/71 | HR 64 | Ht 71.5 in | Wt 213.8 lb

## 2018-02-02 DIAGNOSIS — I2510 Atherosclerotic heart disease of native coronary artery without angina pectoris: Secondary | ICD-10-CM

## 2018-02-02 DIAGNOSIS — I10 Essential (primary) hypertension: Secondary | ICD-10-CM | POA: Diagnosis not present

## 2018-02-02 DIAGNOSIS — I25118 Atherosclerotic heart disease of native coronary artery with other forms of angina pectoris: Secondary | ICD-10-CM

## 2018-02-02 NOTE — Patient Instructions (Signed)

## 2018-02-14 ENCOUNTER — Encounter: Payer: Self-pay | Admitting: Family Medicine

## 2018-03-02 ENCOUNTER — Ambulatory Visit (INDEPENDENT_AMBULATORY_CARE_PROVIDER_SITE_OTHER): Payer: Medicare Other | Admitting: *Deleted

## 2018-03-02 DIAGNOSIS — Z5181 Encounter for therapeutic drug level monitoring: Secondary | ICD-10-CM | POA: Diagnosis not present

## 2018-03-02 DIAGNOSIS — I480 Paroxysmal atrial fibrillation: Secondary | ICD-10-CM | POA: Diagnosis not present

## 2018-03-02 LAB — POCT INR: INR: 1.6

## 2018-03-02 MED ORDER — WARFARIN SODIUM 5 MG PO TABS
ORAL_TABLET | ORAL | 0 refills | Status: DC
Start: 2018-03-02 — End: 2018-05-26

## 2018-03-02 NOTE — Patient Instructions (Signed)
Description   Today take 7.5mg , then Continue on same dosage 5mg s daily.  Recheck in 2 weeks. Call with any questions or concerns [phone]

## 2018-03-17 ENCOUNTER — Ambulatory Visit (INDEPENDENT_AMBULATORY_CARE_PROVIDER_SITE_OTHER): Payer: Medicare Other

## 2018-03-17 DIAGNOSIS — Z5181 Encounter for therapeutic drug level monitoring: Secondary | ICD-10-CM

## 2018-03-17 DIAGNOSIS — I480 Paroxysmal atrial fibrillation: Secondary | ICD-10-CM

## 2018-03-17 LAB — POCT INR: INR: 2.2

## 2018-03-17 IMAGING — DX DG CHEST 2V
2 series · 2 of 2 positions shown · non-contrast
Comparison: 07/31/2017

CLINICAL DATA: Cough, wheezing and fever 2 days.

EXAM:
CHEST  2 VIEW

[chest pa]
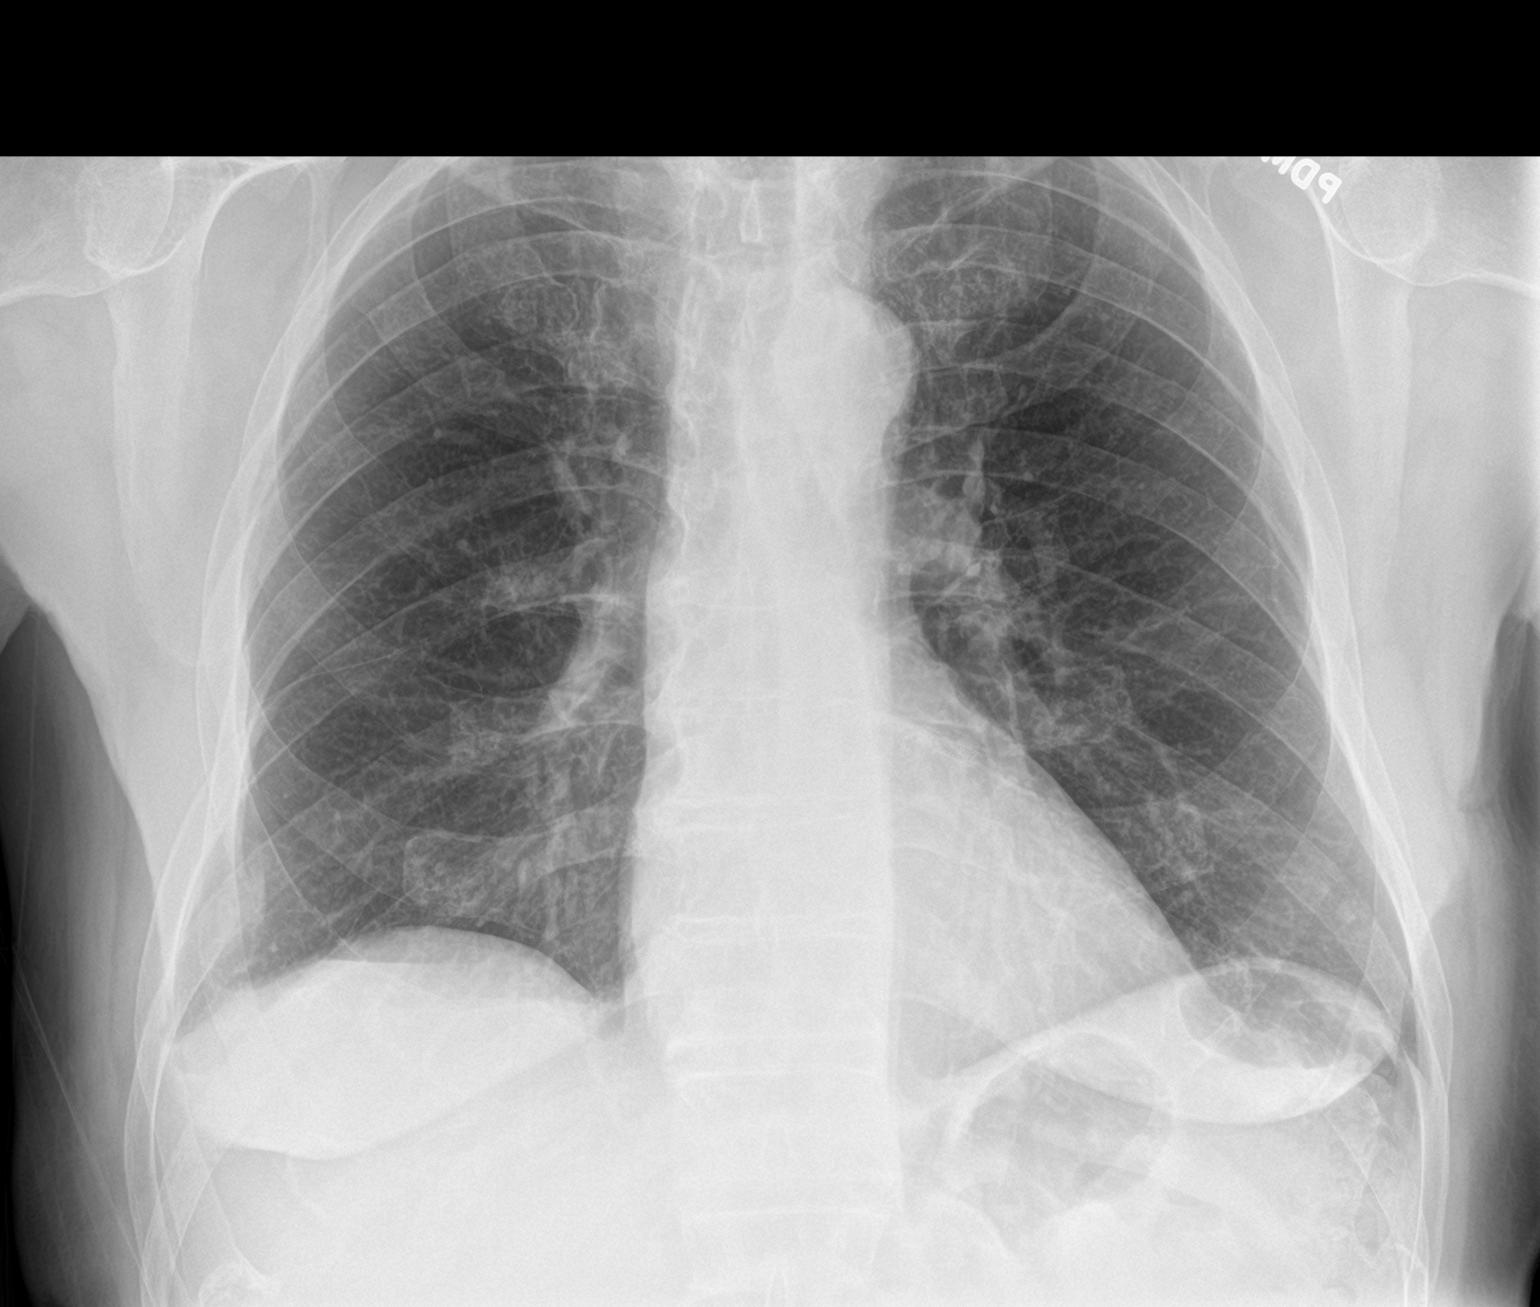

[chest lat]
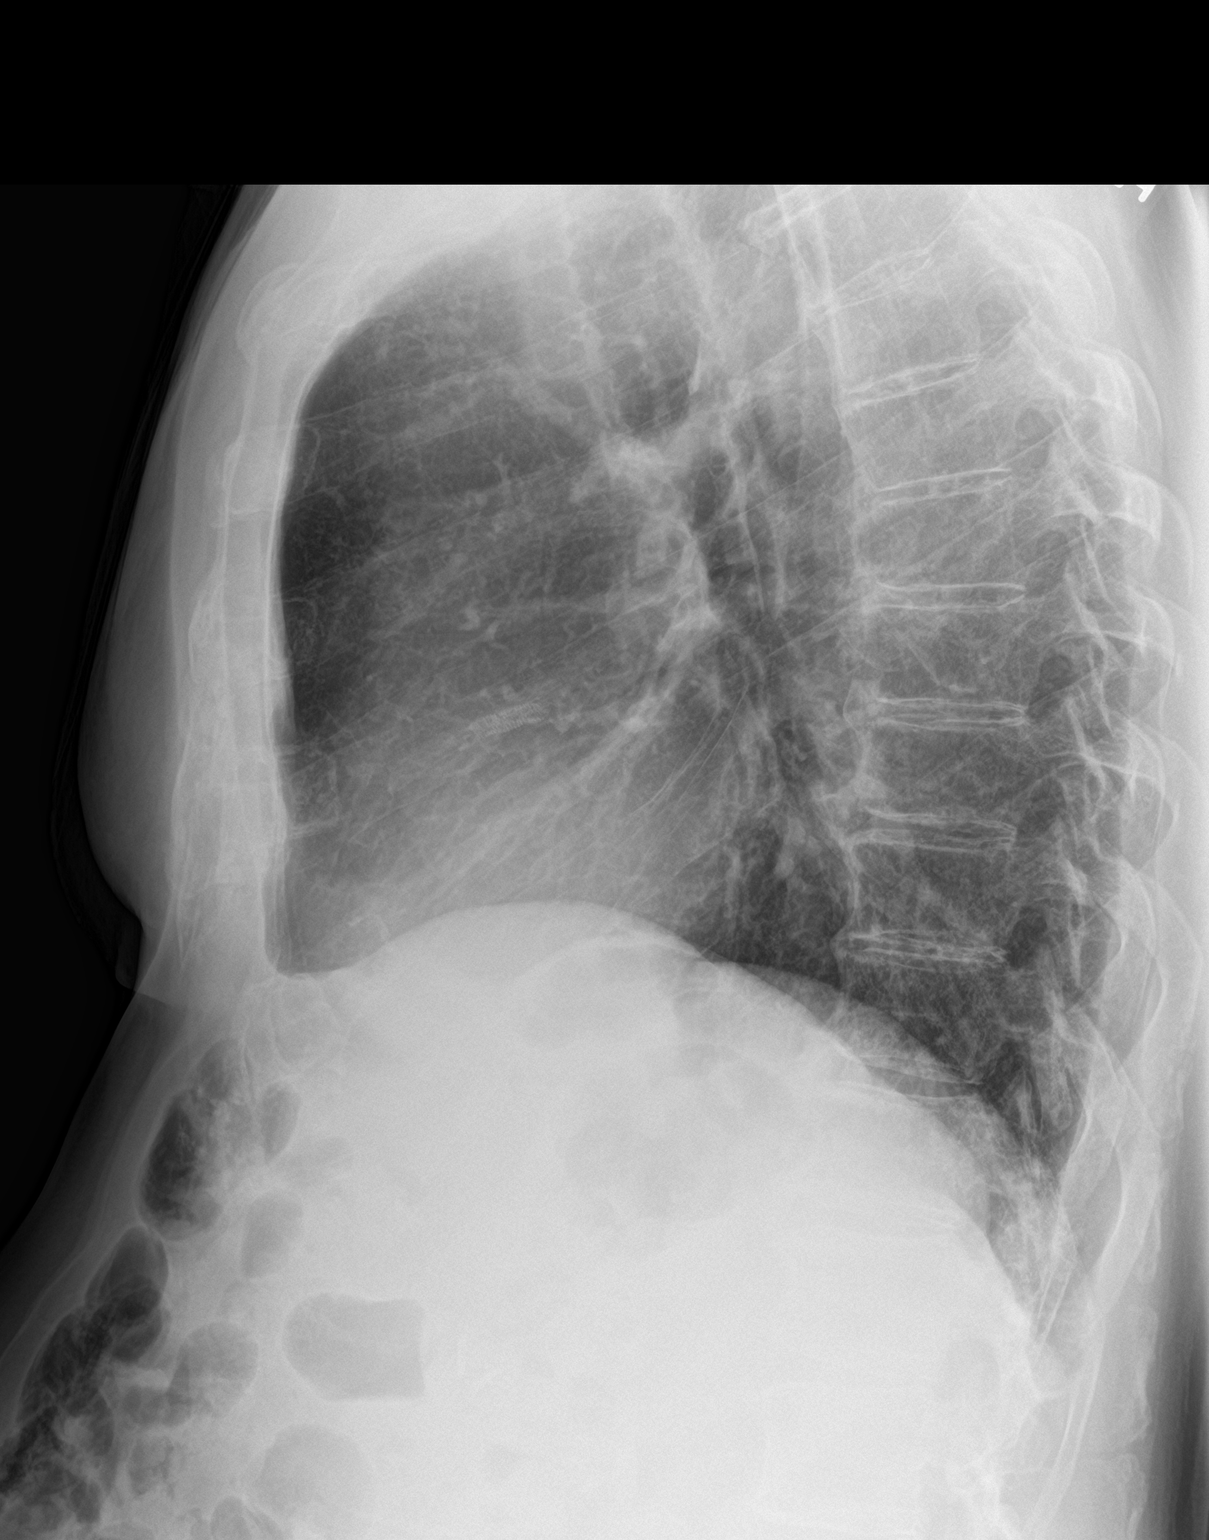

[2 of 2 positions shown; findings below may reference images not displayed]

FINDINGS: Lungs are adequately inflated and otherwise clear. Cardiomediastinal
silhouette is within normal. There is degenerative change of the
spine.
IMPRESSION: No active cardiopulmonary disease.

## 2018-03-17 NOTE — Patient Instructions (Signed)
Description   Continue on same dosage 5mgs daily.  Recheck in 4 weeks. Call with any questions or concerns [phone]      

## 2018-04-11 DIAGNOSIS — H401132 Primary open-angle glaucoma, bilateral, moderate stage: Secondary | ICD-10-CM | POA: Diagnosis not present

## 2018-04-14 ENCOUNTER — Ambulatory Visit (INDEPENDENT_AMBULATORY_CARE_PROVIDER_SITE_OTHER): Payer: Medicare Other | Admitting: Pharmacist

## 2018-04-14 DIAGNOSIS — I480 Paroxysmal atrial fibrillation: Secondary | ICD-10-CM

## 2018-04-14 DIAGNOSIS — Z5181 Encounter for therapeutic drug level monitoring: Secondary | ICD-10-CM

## 2018-04-14 LAB — POCT INR: INR: 2.2 (ref 2.0–3.0)

## 2018-04-14 NOTE — Patient Instructions (Signed)
Description   Continue on same dosage 5mg s daily.  Recheck in 6 weeks. Call with any questions or concerns [phone]

## 2018-04-17 DIAGNOSIS — L819 Disorder of pigmentation, unspecified: Secondary | ICD-10-CM | POA: Diagnosis not present

## 2018-04-17 DIAGNOSIS — L570 Actinic keratosis: Secondary | ICD-10-CM | POA: Diagnosis not present

## 2018-04-17 DIAGNOSIS — C44629 Squamous cell carcinoma of skin of left upper limb, including shoulder: Secondary | ICD-10-CM | POA: Diagnosis not present

## 2018-04-20 DIAGNOSIS — C44629 Squamous cell carcinoma of skin of left upper limb, including shoulder: Secondary | ICD-10-CM | POA: Diagnosis not present

## 2018-05-01 DIAGNOSIS — Z85828 Personal history of other malignant neoplasm of skin: Secondary | ICD-10-CM | POA: Diagnosis not present

## 2018-05-01 DIAGNOSIS — L905 Scar conditions and fibrosis of skin: Secondary | ICD-10-CM | POA: Diagnosis not present

## 2018-05-01 DIAGNOSIS — L570 Actinic keratosis: Secondary | ICD-10-CM | POA: Diagnosis not present

## 2018-05-26 ENCOUNTER — Ambulatory Visit (INDEPENDENT_AMBULATORY_CARE_PROVIDER_SITE_OTHER): Payer: Medicare Other | Admitting: *Deleted

## 2018-05-26 DIAGNOSIS — I480 Paroxysmal atrial fibrillation: Secondary | ICD-10-CM | POA: Diagnosis not present

## 2018-05-26 DIAGNOSIS — Z5181 Encounter for therapeutic drug level monitoring: Secondary | ICD-10-CM

## 2018-05-26 LAB — POCT INR: INR: 1.9 — AB (ref 2.0–3.0)

## 2018-05-26 MED ORDER — WARFARIN SODIUM 5 MG PO TABS: ORAL_TABLET | ORAL | 1 refills | Status: DC

## 2018-06-06 ENCOUNTER — Encounter: Payer: Self-pay | Admitting: Family Medicine

## 2018-06-23 ENCOUNTER — Ambulatory Visit (INDEPENDENT_AMBULATORY_CARE_PROVIDER_SITE_OTHER): Payer: Medicare Other | Admitting: *Deleted

## 2018-06-23 DIAGNOSIS — I480 Paroxysmal atrial fibrillation: Secondary | ICD-10-CM | POA: Diagnosis not present

## 2018-06-23 DIAGNOSIS — Z5181 Encounter for therapeutic drug level monitoring: Secondary | ICD-10-CM

## 2018-06-23 LAB — POCT INR: INR: 2.2 (ref 2.0–3.0)

## 2018-06-23 NOTE — Patient Instructions (Signed)
Description   Continue on same dosage 5mg s daily.  Recheck in 4 weeks. Call with any questions or concerns [phone]

## 2018-06-27 DIAGNOSIS — L819 Disorder of pigmentation, unspecified: Secondary | ICD-10-CM | POA: Diagnosis not present

## 2018-06-27 DIAGNOSIS — L821 Other seborrheic keratosis: Secondary | ICD-10-CM | POA: Diagnosis not present

## 2018-06-27 DIAGNOSIS — Z85820 Personal history of malignant melanoma of skin: Secondary | ICD-10-CM | POA: Diagnosis not present

## 2018-06-27 DIAGNOSIS — L814 Other melanin hyperpigmentation: Secondary | ICD-10-CM | POA: Diagnosis not present

## 2018-06-27 DIAGNOSIS — L570 Actinic keratosis: Secondary | ICD-10-CM | POA: Diagnosis not present

## 2018-06-27 DIAGNOSIS — Z85828 Personal history of other malignant neoplasm of skin: Secondary | ICD-10-CM | POA: Diagnosis not present

## 2018-06-27 DIAGNOSIS — D1801 Hemangioma of skin and subcutaneous tissue: Secondary | ICD-10-CM | POA: Diagnosis not present

## 2018-06-27 DIAGNOSIS — D229 Melanocytic nevi, unspecified: Secondary | ICD-10-CM | POA: Diagnosis not present

## 2018-07-21 ENCOUNTER — Ambulatory Visit (INDEPENDENT_AMBULATORY_CARE_PROVIDER_SITE_OTHER): Payer: Medicare Other | Admitting: Pharmacist

## 2018-07-21 DIAGNOSIS — I480 Paroxysmal atrial fibrillation: Secondary | ICD-10-CM

## 2018-07-21 DIAGNOSIS — Z5181 Encounter for therapeutic drug level monitoring: Secondary | ICD-10-CM | POA: Diagnosis not present

## 2018-07-21 LAB — POCT INR: INR: 1.5 — AB (ref 2.0–3.0)

## 2018-07-21 NOTE — Patient Instructions (Signed)
Description   Take 1.5 tablets today and tomorrow then continue on same dosage 5mg s daily.  Recheck in 2 weeks. Call with any questions or concerns [phone]

## 2018-07-25 ENCOUNTER — Ambulatory Visit (INDEPENDENT_AMBULATORY_CARE_PROVIDER_SITE_OTHER): Payer: Medicare Other | Admitting: *Deleted

## 2018-07-25 DIAGNOSIS — L738 Other specified follicular disorders: Secondary | ICD-10-CM | POA: Diagnosis not present

## 2018-07-25 DIAGNOSIS — Z23 Encounter for immunization: Secondary | ICD-10-CM | POA: Diagnosis not present

## 2018-07-25 DIAGNOSIS — D485 Neoplasm of uncertain behavior of skin: Secondary | ICD-10-CM | POA: Diagnosis not present

## 2018-08-04 ENCOUNTER — Ambulatory Visit (INDEPENDENT_AMBULATORY_CARE_PROVIDER_SITE_OTHER): Payer: Medicare Other | Admitting: *Deleted

## 2018-08-04 DIAGNOSIS — I480 Paroxysmal atrial fibrillation: Secondary | ICD-10-CM | POA: Diagnosis not present

## 2018-08-04 DIAGNOSIS — Z5181 Encounter for therapeutic drug level monitoring: Secondary | ICD-10-CM | POA: Diagnosis not present

## 2018-08-04 LAB — POCT INR: INR: 1.7 — AB (ref 2.0–3.0)

## 2018-08-04 NOTE — Patient Instructions (Addendum)
Take 7.5mg  today then change to 5mg s daily except 7.5mg  on Fridays.  Recheck in 2 weeks. Call with any questions or concerns [phone]

## 2018-08-06 NOTE — Progress Notes (Signed)
Cardiology Office Note   Date:  08/08/2018   ID:  [name] [name], DOB October 08, 1929, MRN [id number]  PCP:  [name], MD  Cardiologist:   [name], MD    Chief Complaint  Patient presents with  . Atrial Fibrillation      History of Present Illness: [name] [name] is a 84 y.o. male who presents for follow up of PAF.  He was in the ED on late December with PAF. He saw [name] and is considering ablation vs amiodarone.  However, he was doing well at the last visit.  He did have one other episode of fibrillation which was short-lived.  He took a Cardizem he went back in regular rhythm.  He thinks the fact that he is drinking electrolytes is kept him in sinus rhythm.  He otherwise does well and exercises on the treadmill.  He is not having any symptoms with this. The patient denies any new symptoms such as chest discomfort, neck or arm discomfort. There has been no new shortness of breath, PND or orthopnea. There have been no reported palpitations, presyncope or syncope.  Interestingly he did have a recently abnormal INR which was low when he found out that chocolates he was eating as part of the keto diet were reported to bind Coumadin.   Past Medical History:  Diagnosis Date  . Coronary artery disease    a. LHC on 04/06/16 which revealed 40% occl pRCA, 85% occl D1, 75% occl Ramus, 65% occl mLCx, 45% occl pLAD, 90% occl pLAD and nl LV function. s/p PCI/DES to prox LAD. Medical therapy for diag disease.    Marland Kitchen GERD (gastroesophageal reflux disease)   . Glaucoma   . Hemorrhoids    internal  . HTN (hypertension)   . Hx of colonic polyps    tubular adenoma   . Hyperlipidemia   . Kidney stone   . Melanoma of cheek ([place])   . Nephrolithiasis   . Pancolonic diverticulosis   . Paroxysmal atrial fibrillation (HCC)    a. s/p ablation 12/2014: on coumadin    Past Surgical History:  Procedure Laterality Date  . ATRIAL FIBRILLATION ABLATION N/A 11/12/2014   Procedure: ATRIAL  FIBRILLATION ABLATION;  Surgeon: [name], MD;  Location: [facility] CATH LAB;  Service: Cardiovascular;  Laterality: N/A;  . BLADDER SURGERY     ruptured blood vessel in the bladder 3 weeks S/P ureter stones removed  . CARDIAC CATHETERIZATION N/A 04/06/2016   Procedure: Left Heart Cath and Coronary Angiography;  Surgeon: [name], MD;  Location: [place] CV LAB;  Service: Cardiovascular;  Laterality: N/A;  . CARDIAC CATHETERIZATION N/A 04/06/2016   Procedure: Coronary Stent Intervention;  Surgeon: [name], MD;  Location: [place] CV LAB;  Service: Cardiovascular;  Laterality: N/A;  . CATARACT EXTRACTION W/ INTRAOCULAR LENS  IMPLANT, BILATERAL Bilateral   . COLONOSCOPY  06-29-12   per Dr. [name], adenomatous polyps, repeat in 3 yrs   . CORONARY ANGIOPLASTY WITH STENT PLACEMENT  04/06/2016   "1 stent"  . CYSTOSCOPY/RETROGRADE/URETEROSCOPY/STONE EXTRACTION WITH BASKET Left    removed 2 stones from left ureter  . INGUINAL HERNIA REPAIR Right 04/1990   Dr. [name]  . LAPAROSCOPIC CHOLECYSTECTOMY  02/1990   Dr. [name]  . MELANOMA EXCISION Right    "@ cheek near ear"  . TEE WITHOUT CARDIOVERSION N/A 11/11/2014   Procedure: TRANSESOPHAGEAL ECHOCARDIOGRAM (TEE);  Surgeon: [name], MD;  Location: [place];  Service: Cardiovascular;  Laterality: N/A;  needs INR before case  . VASECTOMY       Current Outpatient Medications  Medication Sig Dispense Refill  . acetaminophen (TYLENOL) 325 MG tablet Take 325 mg by mouth 2 (two) times daily.     Marland Kitchen atorvastatin (LIPITOR) 80 MG tablet Take 1 tablet (80 mg total) by mouth daily. (Patient taking differently: Take 80 mg by mouth at bedtime. ) 90 tablet 3  . cetirizine (ZYRTEC) 10 MG tablet Take 10 mg by mouth every morning.     . diltiazem (CARDIZEM) 30 MG tablet Take 1 tablet every 4 hours AS NEEDED for afib heart rate over 100 45 tablet 1  . finasteride (PROSCAR) 5 MG tablet Take 5 mg daily by mouth.    Marland Kitchen HYDROcodone-homatropine  (HYCODAN) 5-1.5 MG/5ML syrup Take 5 mLs by mouth every 8 (eight) hours as needed for cough. 240 mL 0  . latanoprost (XALATAN) 0.005 % ophthalmic solution Place 1 drop into both eyes at bedtime.     Marland Kitchen lisinopril (PRINIVIL,ZESTRIL) 10 MG tablet Take 10 mg by mouth at bedtime.     Marland Kitchen loratadine (CLARITIN) 10 MG tablet Take 10 mg by mouth at bedtime.     . metoprolol tartrate (LOPRESSOR) 50 MG tablet Take 25 mg by mouth 2 (two) times daily.     . mometasone (NASONEX) 50 MCG/ACT nasal spray Place 2 sprays into both nostrils at bedtime.     . Multiple Vitamins-Minerals (CENTRUM SILVER PO) Take 1 tablet by mouth every morning.     . nitroGLYCERIN (NITROSTAT) 0.4 MG SL tablet Place 1 tablet (0.4 mg total) under the tongue every 5 (five) minutes as needed for chest pain. 30 tablet 12  . pantoprazole (PROTONIX) 40 MG tablet Take 1 tablet (40 mg total) by mouth daily. 90 tablet 3  . potassium chloride (KLOR-CON) 10 MEQ CR tablet Take 10 mEq by mouth every morning.     . tamsulosin (FLOMAX) 0.4 MG CAPS Take 0.4 mg by mouth 2 (two) times daily.     Marland Kitchen warfarin (COUMADIN) 5 MG tablet TAKE 1 TABLET BY MOUTH DAILY AS DIRECTEDBY COUMADIN CLINIC 90 tablet 1   No current facility-administered medications for this visit.     Allergies:   Sulfa antibiotics and Sulfamethoxazole     ROS:  Please see the history of present illness.   Otherwise, review of systems are positive for no.   All other systems are reviewed and negative.    PHYSICAL EXAM: VS:  BP 120/74 (BP Location: Left Arm, Patient Position: Sitting, Cuff Size: Large)   Pulse (!) 53   Ht 5' 11.5" (1.816 m)   Wt 204 lb (92.5 kg)   BMI 28.06 kg/m  , BMI Body mass index is 28.06 kg/m.  GENERAL:  Well appearing NECK:  No jugular venous distention, waveform within normal limits, carotid upstroke brisk and symmetric, no bruits, no thyromegaly LUNGS:  Clear to auscultation bilaterally CHEST:  Unremarkable HEART:  PMI not displaced or sustained,S1 and S2  within normal limits, no S3, no S4, no clicks, no rubs, no murmurs ABD:  Flat, positive bowel sounds normal in frequency in pitch, no bruits, no rebound, no guarding, no midline pulsatile mass, no hepatomegaly, no splenomegaly EXT:  2 plus pulses throughout, mild leg edema, no cyanosis no clubbing   EKG:  EKG is ordered today. Sinus rhythm, rate 53, axis within normal limits, intervals within normal limits, no acute ST-T wave changes.  Recent Labs: 08/26/2017: ALT 25; TSH 1.36 11/06/2017:  BUN 23; Creatinine, Ser 1.23; Hemoglobin 14.2; Platelets 288; Potassium 4.5; Sodium 139    Lipid Panel    Component Value Date/Time   CHOL 127 08/26/2017 1008   TRIG 124.0 08/26/2017 1008   HDL 41.00 08/26/2017 1008   CHOLHDL 3 08/26/2017 1008   VLDL 24.8 08/26/2017 1008   LDLCALC 61 08/26/2017 1008      Wt Readings from Last 3 Encounters:  08/08/18 204 lb (92.5 kg)  02/02/18 213 lb 12.8 oz (97 kg)  12/01/17 209 lb (94.8 kg)      Other studies Reviewed: Additional studies/ records that were reviewed today include: None Review of the above records demonstrates:      ASSESSMENT AND PLAN:   CAD:   The patient has no new sypmtoms.  No further cardiovascular testing is indicated.  We will continue with aggressive risk reduction and meds as listed.  ATRIAL FIB:   [name] [name] has a CHA2DS2 - VASc score of 3.    No change in therapy.  Of note he has upcoming labs including his CBC and I will defer to [name], MD  HTN:  The blood pressure is at target.  I reviewed his blood pressure diary.  No change in therapy.  Current medicines are reviewed at length with the patient today.  The patient does not have concerns regarding medicines.  The following changes have been made:  None   Labs/ tests ordered today include: None  Orders Placed This Encounter  Procedures  . EKG 12-Lead     Disposition:   FU with me in 6 months.      Signed, [name], MD  08/08/2018 2:12  PM    [facility]

## 2018-08-08 ENCOUNTER — Ambulatory Visit (INDEPENDENT_AMBULATORY_CARE_PROVIDER_SITE_OTHER): Payer: Medicare Other | Admitting: Cardiology

## 2018-08-08 ENCOUNTER — Encounter: Payer: Self-pay | Admitting: Cardiology

## 2018-08-08 VITALS — BP 120/74 | HR 53 | Ht 71.5 in | Wt 204.0 lb

## 2018-08-08 DIAGNOSIS — I480 Paroxysmal atrial fibrillation: Secondary | ICD-10-CM

## 2018-08-08 DIAGNOSIS — L570 Actinic keratosis: Secondary | ICD-10-CM | POA: Diagnosis not present

## 2018-08-08 DIAGNOSIS — I2510 Atherosclerotic heart disease of native coronary artery without angina pectoris: Secondary | ICD-10-CM | POA: Diagnosis not present

## 2018-08-08 NOTE — Patient Instructions (Signed)
Medication Instructions:  Continue current medications  If you need a refill on your cardiac medications before your next appointment, please call your pharmacy.  Labwork: None Ordered   Testing/Procedures: None Ordered   Follow-Up: Your physician wants you to follow-up in: 6 Months You should receive a reminder letter in the mail two months in advance. If you do not receive a letter, please call our office in [phone] to schedule your follow-up appointment.     Thank you for choosing [facility]!!       

## 2018-08-15 DIAGNOSIS — Z961 Presence of intraocular lens: Secondary | ICD-10-CM | POA: Diagnosis not present

## 2018-08-15 DIAGNOSIS — H401132 Primary open-angle glaucoma, bilateral, moderate stage: Secondary | ICD-10-CM | POA: Diagnosis not present

## 2018-08-18 ENCOUNTER — Ambulatory Visit (INDEPENDENT_AMBULATORY_CARE_PROVIDER_SITE_OTHER): Payer: Medicare Other

## 2018-08-18 DIAGNOSIS — Z5181 Encounter for therapeutic drug level monitoring: Secondary | ICD-10-CM | POA: Diagnosis not present

## 2018-08-18 DIAGNOSIS — I480 Paroxysmal atrial fibrillation: Secondary | ICD-10-CM

## 2018-08-18 LAB — POCT INR: INR: 1.8 — AB (ref 2.0–3.0)

## 2018-08-18 NOTE — Patient Instructions (Signed)
Description   Take 10mg  today, then resume same dosage 5mg s daily except 7.5mg  on Fridays.  Recheck in 2 weeks. Call with any questions or concerns [phone]

## 2018-08-29 ENCOUNTER — Encounter: Payer: Self-pay | Admitting: Family Medicine

## 2018-08-29 ENCOUNTER — Ambulatory Visit (INDEPENDENT_AMBULATORY_CARE_PROVIDER_SITE_OTHER): Payer: Medicare Other | Admitting: Family Medicine

## 2018-08-29 VITALS — BP 126/72 | HR 52 | Temp 98.2°F | Ht 72.0 in | Wt 202.2 lb

## 2018-08-29 DIAGNOSIS — K219 Gastro-esophageal reflux disease without esophagitis: Secondary | ICD-10-CM

## 2018-08-29 DIAGNOSIS — N138 Other obstructive and reflux uropathy: Secondary | ICD-10-CM

## 2018-08-29 DIAGNOSIS — I2510 Atherosclerotic heart disease of native coronary artery without angina pectoris: Secondary | ICD-10-CM

## 2018-08-29 DIAGNOSIS — I10 Essential (primary) hypertension: Secondary | ICD-10-CM | POA: Diagnosis not present

## 2018-08-29 DIAGNOSIS — E782 Mixed hyperlipidemia: Secondary | ICD-10-CM | POA: Diagnosis not present

## 2018-08-29 DIAGNOSIS — Z9861 Coronary angioplasty status: Secondary | ICD-10-CM

## 2018-08-29 DIAGNOSIS — I480 Paroxysmal atrial fibrillation: Secondary | ICD-10-CM | POA: Diagnosis not present

## 2018-08-29 DIAGNOSIS — N401 Enlarged prostate with lower urinary tract symptoms: Secondary | ICD-10-CM | POA: Diagnosis not present

## 2018-08-29 LAB — CBC WITH DIFFERENTIAL/PLATELET
Basophils Absolute: 0 10*3/uL (ref 0.0–0.1)
Basophils Relative: 0.3 % (ref 0.0–3.0)
EOS ABS: 0.2 10*3/uL (ref 0.0–0.7)
Eosinophils Relative: 3.3 % (ref 0.0–5.0)
HCT: 38 % — ABNORMAL LOW (ref 39.0–52.0)
HEMOGLOBIN: 12.9 g/dL — AB (ref 13.0–17.0)
Lymphocytes Relative: 29.6 % (ref 12.0–46.0)
Lymphs Abs: 2.2 10*3/uL (ref 0.7–4.0)
MCHC: 34 g/dL (ref 30.0–36.0)
MCV: 97 fl (ref 78.0–100.0)
MONO ABS: 0.7 10*3/uL (ref 0.1–1.0)
Monocytes Relative: 9.5 % (ref 3.0–12.0)
Neutro Abs: 4.2 10*3/uL (ref 1.4–7.7)
Neutrophils Relative %: 57.3 % (ref 43.0–77.0)
Platelets: 156 10*3/uL (ref 150.0–400.0)
RBC: 3.92 Mil/uL — AB (ref 4.22–5.81)
RDW: 13.8 % (ref 11.5–15.5)
WBC: 7.4 10*3/uL (ref 4.0–10.5)

## 2018-08-29 LAB — BASIC METABOLIC PANEL
BUN: 22 mg/dL (ref 6–23)
CALCIUM: 9.4 mg/dL (ref 8.4–10.5)
CO2: 33 meq/L — AB (ref 19–32)
CREATININE: 1.02 mg/dL (ref 0.40–1.50)
Chloride: 103 mEq/L (ref 96–112)
GFR: 73.4 mL/min (ref >60.00)
Glucose, Bld: 102 mg/dL — ABNORMAL HIGH (ref 70–99)
Potassium: 4.1 mEq/L (ref 3.5–5.1)
Sodium: 142 mEq/L (ref 135–145)

## 2018-08-29 LAB — HEPATIC FUNCTION PANEL
ALBUMIN: 4.3 g/dL (ref 3.5–5.2)
ALT: 29 U/L (ref 0–53)
AST: 29 U/L (ref 0–37)
Alkaline Phosphatase: 54 U/L (ref 39–117)
BILIRUBIN DIRECT: 0.1 mg/dL (ref 0.0–0.3)
TOTAL PROTEIN: 6.4 g/dL (ref 6.0–8.3)
Total Bilirubin: 0.7 mg/dL (ref 0.2–1.2)

## 2018-08-29 LAB — POC URINALSYSI DIPSTICK (AUTOMATED)
Bilirubin, UA: NEGATIVE
Glucose, UA: NEGATIVE
Ketones, UA: NEGATIVE
Leukocytes, UA: NEGATIVE
Nitrite, UA: NEGATIVE
PROTEIN UA: NEGATIVE
RBC UA: NEGATIVE
SPEC GRAV UA: 1.025 (ref 1.010–1.025)
UROBILINOGEN UA: 0.2 U/dL
pH, UA: 6 (ref 5.0–8.0)

## 2018-08-29 LAB — PSA: PSA: 0.21 ng/mL (ref 0.10–4.00)

## 2018-08-29 LAB — LIPID PANEL
Cholesterol: 132 mg/dL (ref 0–200)
HDL: 41.6 mg/dL (ref >39.00)
LDL CALC: 69 mg/dL (ref 0–99)
NONHDL: 90.38
Total CHOL/HDL Ratio: 3
Triglycerides: 106 mg/dL (ref 0.0–149.0)
VLDL: 21.2 mg/dL (ref 0.0–40.0)

## 2018-08-29 LAB — TSH: TSH: 1.94 u[IU]/mL (ref 0.35–4.50)

## 2018-08-29 NOTE — Progress Notes (Signed)
   Subjective:    Patient ID: [name] [name], male    DOB: 1929-01-12, 84 y.o.   MRN: [id number]  HPI Here to follow up on issues. He feels fine and has no concerns. He saw Dr. Percival Spanish on 08-08-18 and he was pleased with his progress. The paroxysmal atrial fibrillation is mostly quiet although he will have a short bout from time to time. Heart rate and BP are well controlled. He sees the [place] clinic several times a year, and he will see the [facility] clinic in a few weeks.    Review of Systems  Constitutional: Negative.   HENT: Negative.   Eyes: Negative.   Respiratory: Negative.   Cardiovascular: Negative.   Gastrointestinal: Negative.   Genitourinary: Negative.   Musculoskeletal: Negative.   Skin: Negative.   Neurological: Negative.   Psychiatric/Behavioral: Negative.        Objective:   Physical Exam  Constitutional: He is oriented to person, place, and time. He appears well-developed and well-nourished. No distress.  HENT:  Head: Normocephalic and atraumatic.  Right Ear: External ear normal.  Left Ear: External ear normal.  Nose: Nose normal.  Mouth/Throat: Oropharynx is clear and moist. No oropharyngeal exudate.  Eyes: Pupils are equal, round, and reactive to light. Conjunctivae and EOM are normal. Right eye exhibits no discharge. Left eye exhibits no discharge. No scleral icterus.  Neck: Neck supple. No JVD present. No tracheal deviation present. No thyromegaly present.  Cardiovascular: Normal rate, regular rhythm, normal heart sounds and intact distal pulses. Exam reveals no gallop and no friction rub.  No murmur heard. Pulmonary/Chest: Effort normal and breath sounds normal. No respiratory distress. He has no wheezes. He has no rales. He exhibits no tenderness.  Abdominal: Soft. Bowel sounds are normal. He exhibits no distension and no mass. There is no tenderness. There is no rebound and no guarding.  Musculoskeletal: Normal range of motion. He exhibits no edema or  tenderness.  Lymphadenopathy:    He has no cervical adenopathy.  Neurological: He is alert and oriented to person, place, and time. He has normal reflexes. He displays normal reflexes. No cranial nerve deficit. He exhibits normal muscle tone. Coordination normal.  Skin: Skin is warm and dry. No rash noted. He is not diaphoretic. No erythema. No pallor.  Psychiatric: He has a normal mood and affect. His behavior is normal. Judgment and thought content normal.          Assessment & Plan:  His atrial fibrillation is controlled for the most part. He is in sinus rhythm today. His HTN is stable. We will get fasting labs to check lipids, etc. He will see Urology as above.  [name], MD

## 2018-08-30 ENCOUNTER — Encounter: Payer: Self-pay | Admitting: *Deleted

## 2018-09-01 ENCOUNTER — Ambulatory Visit (INDEPENDENT_AMBULATORY_CARE_PROVIDER_SITE_OTHER): Payer: Medicare Other | Admitting: Pharmacist

## 2018-09-01 DIAGNOSIS — I480 Paroxysmal atrial fibrillation: Secondary | ICD-10-CM | POA: Diagnosis not present

## 2018-09-01 DIAGNOSIS — Z5181 Encounter for therapeutic drug level monitoring: Secondary | ICD-10-CM | POA: Diagnosis not present

## 2018-09-01 LAB — POCT INR: INR: 2.5 (ref 2.0–3.0)

## 2018-09-01 NOTE — Patient Instructions (Signed)
Description   Continue same dosage 5mg s daily except 7.5mg  on Fridays.  Recheck in 3 weeks. Call with any questions or concerns [phone]

## 2018-09-06 DIAGNOSIS — N401 Enlarged prostate with lower urinary tract symptoms: Secondary | ICD-10-CM | POA: Diagnosis not present

## 2018-09-06 DIAGNOSIS — R3912 Poor urinary stream: Secondary | ICD-10-CM | POA: Diagnosis not present

## 2018-09-06 DIAGNOSIS — N200 Calculus of kidney: Secondary | ICD-10-CM | POA: Diagnosis not present

## 2018-09-22 ENCOUNTER — Ambulatory Visit (INDEPENDENT_AMBULATORY_CARE_PROVIDER_SITE_OTHER): Payer: Medicare Other | Admitting: *Deleted

## 2018-09-22 DIAGNOSIS — Z5181 Encounter for therapeutic drug level monitoring: Secondary | ICD-10-CM | POA: Diagnosis not present

## 2018-09-22 DIAGNOSIS — I480 Paroxysmal atrial fibrillation: Secondary | ICD-10-CM

## 2018-09-22 LAB — POCT INR: INR: 2.4 (ref 2.0–3.0)

## 2018-09-22 NOTE — Patient Instructions (Addendum)
Description   Continue same dosage 5mg s daily except 7.5mg  on Fridays.  Recheck in 4 weeks. Call with any questions or concerns [phone]

## 2018-09-25 ENCOUNTER — Other Ambulatory Visit: Payer: Self-pay

## 2018-10-20 ENCOUNTER — Ambulatory Visit (INDEPENDENT_AMBULATORY_CARE_PROVIDER_SITE_OTHER): Payer: Medicare Other | Admitting: *Deleted

## 2018-10-20 DIAGNOSIS — I480 Paroxysmal atrial fibrillation: Secondary | ICD-10-CM | POA: Diagnosis not present

## 2018-10-20 DIAGNOSIS — Z5181 Encounter for therapeutic drug level monitoring: Secondary | ICD-10-CM

## 2018-10-20 LAB — POCT INR: INR: 1.6 — AB (ref 2.0–3.0)

## 2018-10-20 NOTE — Patient Instructions (Signed)
Description   Today take 10mg  (2 tablets) then continue same dosage 5mg s daily except 7.5mg  on Fridays.  Recheck in 3 weeks. Call with any questions or concerns [phone]

## 2018-11-10 ENCOUNTER — Ambulatory Visit (INDEPENDENT_AMBULATORY_CARE_PROVIDER_SITE_OTHER): Payer: Medicare Other

## 2018-11-10 DIAGNOSIS — I480 Paroxysmal atrial fibrillation: Secondary | ICD-10-CM

## 2018-11-10 DIAGNOSIS — Z5181 Encounter for therapeutic drug level monitoring: Secondary | ICD-10-CM

## 2018-11-10 LAB — POCT INR: INR: 2.6 (ref 2.0–3.0)

## 2018-11-10 NOTE — Patient Instructions (Signed)
Description   Continue same dosage 5mg s daily except 7.5mg  on Fridays.  Recheck in 4 weeks. Call with any questions or concerns [phone]

## 2018-11-14 ENCOUNTER — Other Ambulatory Visit: Payer: Self-pay | Admitting: Cardiology

## 2018-12-08 ENCOUNTER — Ambulatory Visit (INDEPENDENT_AMBULATORY_CARE_PROVIDER_SITE_OTHER): Payer: Medicare Other | Admitting: Pharmacist

## 2018-12-08 DIAGNOSIS — Z5181 Encounter for therapeutic drug level monitoring: Secondary | ICD-10-CM | POA: Diagnosis not present

## 2018-12-08 DIAGNOSIS — I480 Paroxysmal atrial fibrillation: Secondary | ICD-10-CM

## 2018-12-08 LAB — POCT INR: INR: 2.3 (ref 2.0–3.0)

## 2018-12-08 NOTE — Patient Instructions (Signed)
Continue same dosage 5mg s daily except 7.5mg  on Fridays.  Recheck in 4 weeks. Call with any questions or concerns [phone]

## 2018-12-18 DIAGNOSIS — H401132 Primary open-angle glaucoma, bilateral, moderate stage: Secondary | ICD-10-CM | POA: Diagnosis not present

## 2019-01-05 ENCOUNTER — Ambulatory Visit (INDEPENDENT_AMBULATORY_CARE_PROVIDER_SITE_OTHER): Payer: Medicare Other

## 2019-01-05 DIAGNOSIS — Z5181 Encounter for therapeutic drug level monitoring: Secondary | ICD-10-CM | POA: Diagnosis not present

## 2019-01-05 DIAGNOSIS — I480 Paroxysmal atrial fibrillation: Secondary | ICD-10-CM | POA: Diagnosis not present

## 2019-01-05 LAB — POCT INR: INR: 2 (ref 2.0–3.0)

## 2019-01-05 NOTE — Patient Instructions (Signed)
Description   Continue on same dosage 5mg s daily except 7.5mg  on Fridays.  Recheck in 5 weeks. Call with any questions or concerns [phone]

## 2019-01-16 ENCOUNTER — Encounter: Payer: Self-pay | Admitting: Family Medicine

## 2019-01-16 ENCOUNTER — Ambulatory Visit (INDEPENDENT_AMBULATORY_CARE_PROVIDER_SITE_OTHER): Payer: Medicare Other | Admitting: Family Medicine

## 2019-01-16 VITALS — BP 128/60 | HR 59 | Temp 98.7°F | Wt 204.4 lb

## 2019-01-16 DIAGNOSIS — R05 Cough: Secondary | ICD-10-CM | POA: Diagnosis not present

## 2019-01-16 DIAGNOSIS — K5732 Diverticulitis of large intestine without perforation or abscess without bleeding: Secondary | ICD-10-CM | POA: Diagnosis not present

## 2019-01-16 DIAGNOSIS — R059 Cough, unspecified: Secondary | ICD-10-CM

## 2019-01-16 MED ORDER — HYDROCODONE-HOMATROPINE 5-1.5 MG/5ML PO SYRP
5.0000 mL | ORAL_SOLUTION | ORAL | 0 refills | Status: DC | PRN
Start: 2019-01-16 — End: 2020-09-16

## 2019-01-16 MED ORDER — CIPROFLOXACIN HCL 500 MG PO TABS: 500.0000 mg | ORAL_TABLET | Freq: Two times a day (BID) | ORAL | 0 refills | Status: DC

## 2019-01-16 MED ORDER — METRONIDAZOLE 500 MG PO TABS: 500.0000 mg | ORAL_TABLET | Freq: Two times a day (BID) | ORAL | 0 refills | Status: DC

## 2019-01-16 NOTE — Progress Notes (Signed)
   Subjective:    Patient ID: [name] [name], male    DOB: November 16, 1928, 85 y.o.   MRN: [id number]  HPI Here for what he thinks is another bout of diverticulitis. He developed intermittent pain in the LLQ yesterday. His BMs are not changed. No fever. No urinary symptoms. He thinks eating a bag of trail mix set this off.   Review of Systems  Constitutional: Negative.   Respiratory: Negative.   Cardiovascular: Negative.   Gastrointestinal: Positive for abdominal pain. Negative for abdominal distention, anal bleeding, blood in stool, constipation, diarrhea, nausea, rectal pain and vomiting.  Genitourinary: Negative.        Objective:   Physical Exam Constitutional:      Appearance: He is well-developed. He is not ill-appearing.  Cardiovascular:     Rate and Rhythm: Normal rate and regular rhythm.     Pulses: Normal pulses.     Heart sounds: Normal heart sounds.  Pulmonary:     Effort: Pulmonary effort is normal.     Breath sounds: Normal breath sounds.  Abdominal:     General: Abdomen is flat. Bowel sounds are normal. There is no distension.     Palpations: There is no mass.     Tenderness: There is no guarding or rebound.     Hernia: No hernia is present.     Comments: Mildly tender in the LLQ  Neurological:     Mental Status: He is alert.           Assessment & Plan:  Diverticulitis, treat with Cipro and Flagyl. Recheck prn. [name], MD

## 2019-01-17 ENCOUNTER — Encounter: Payer: Self-pay | Admitting: Family Medicine

## 2019-01-18 NOTE — Telephone Encounter (Signed)
FYI for Dr. Fry  

## 2019-01-19 ENCOUNTER — Ambulatory Visit (INDEPENDENT_AMBULATORY_CARE_PROVIDER_SITE_OTHER): Payer: Medicare Other | Admitting: Pharmacist

## 2019-01-19 ENCOUNTER — Other Ambulatory Visit: Payer: Self-pay

## 2019-01-19 DIAGNOSIS — I480 Paroxysmal atrial fibrillation: Secondary | ICD-10-CM

## 2019-01-19 DIAGNOSIS — Z5181 Encounter for therapeutic drug level monitoring: Secondary | ICD-10-CM | POA: Diagnosis not present

## 2019-01-19 LAB — POCT INR: INR: 1.6 — AB (ref 2.0–3.0)

## 2019-01-19 NOTE — Patient Instructions (Signed)
Description   Take 2 tablets today and 1.5 tablets tomorrow, then continue on same dosage 5mg s daily except 7.5mg  on Fridays.  Recheck in 3 weeks. Call with any questions or concerns [phone]

## 2019-02-05 ENCOUNTER — Ambulatory Visit: Payer: Medicare Other | Admitting: Cardiology

## 2019-02-06 DIAGNOSIS — D485 Neoplasm of uncertain behavior of skin: Secondary | ICD-10-CM | POA: Diagnosis not present

## 2019-02-06 DIAGNOSIS — L570 Actinic keratosis: Secondary | ICD-10-CM | POA: Diagnosis not present

## 2019-02-06 DIAGNOSIS — L819 Disorder of pigmentation, unspecified: Secondary | ICD-10-CM | POA: Diagnosis not present

## 2019-02-06 DIAGNOSIS — Z85828 Personal history of other malignant neoplasm of skin: Secondary | ICD-10-CM | POA: Diagnosis not present

## 2019-02-06 DIAGNOSIS — L989 Disorder of the skin and subcutaneous tissue, unspecified: Secondary | ICD-10-CM | POA: Diagnosis not present

## 2019-02-08 ENCOUNTER — Telehealth: Payer: Self-pay

## 2019-02-08 NOTE — Telephone Encounter (Signed)

## 2019-02-08 NOTE — Telephone Encounter (Signed)
LMOM FOR PRESCREEN/DRIVE THRU 

## 2019-02-09 ENCOUNTER — Ambulatory Visit (INDEPENDENT_AMBULATORY_CARE_PROVIDER_SITE_OTHER): Payer: Medicare Other | Admitting: Pharmacist

## 2019-02-09 ENCOUNTER — Other Ambulatory Visit: Payer: Self-pay

## 2019-02-09 DIAGNOSIS — I480 Paroxysmal atrial fibrillation: Secondary | ICD-10-CM

## 2019-02-09 DIAGNOSIS — Z5181 Encounter for therapeutic drug level monitoring: Secondary | ICD-10-CM

## 2019-02-09 LAB — POCT INR: INR: 2.4 (ref 2.0–3.0)

## 2019-02-19 ENCOUNTER — Telehealth: Payer: Self-pay | Admitting: Pharmacist

## 2019-02-19 NOTE — Telephone Encounter (Signed)
Mr. Galan called to let us know that [facility] will pay for Eliquis, but they require him to come to [facility] every month-2 months for labs. He states that he will remain on warfarin. Advised agree and will keep upcoming appt as scheduled. He is unable to afford cash price of Eliquis.

## 2019-03-14 ENCOUNTER — Telehealth: Payer: Self-pay

## 2019-03-14 NOTE — Telephone Encounter (Signed)

## 2019-03-16 ENCOUNTER — Other Ambulatory Visit: Payer: Self-pay

## 2019-03-16 ENCOUNTER — Ambulatory Visit (INDEPENDENT_AMBULATORY_CARE_PROVIDER_SITE_OTHER): Payer: Medicare Other | Admitting: Pharmacist

## 2019-03-16 DIAGNOSIS — I480 Paroxysmal atrial fibrillation: Secondary | ICD-10-CM

## 2019-03-16 DIAGNOSIS — Z5181 Encounter for therapeutic drug level monitoring: Secondary | ICD-10-CM

## 2019-03-16 LAB — POCT INR: INR: 2.3 (ref 2.0–3.0)

## 2019-04-04 ENCOUNTER — Telehealth: Payer: Self-pay | Admitting: Cardiology

## 2019-04-04 NOTE — Telephone Encounter (Signed)
MyChart msg sent.

## 2019-04-11 ENCOUNTER — Telehealth: Payer: Self-pay | Admitting: Cardiology

## 2019-04-18 NOTE — Progress Notes (Signed)
Virtual Visit via Telephone Note   This visit type was conducted due to national recommendations for restrictions regarding the COVID-19 Pandemic (e.g. social distancing) in an effort to limit this patient's exposure and mitigate transmission in our community.  Due to his co-morbid illnesses, this patient is at least at moderate risk for complications without adequate follow up.  This format is felt to be most appropriate for this patient at this time.  The patient did not have access to video technology/had technical difficulties with video requiring transitioning to audio format only (telephone).  All issues noted in this document were discussed and addressed.  No physical exam could be performed with this format.  Please refer to the patient's chart for his  consent to telehealth for [facility].   Date:  04/19/2019   ID:  [name] [name], DOB [birth year], MRN [id number]  Patient Location: Home Provider Location: Home  PCP:  [name], MD  Cardiologist:  [name], MD  Electrophysiologist:  Dr. [name]  Evaluation Performed:  Follow-Up Visit  Chief Complaint:  Palpitations  History of Present Illness:    [name] [name] is a 85 y.o. male with who presents for follow up of PAF.  He was in the ED on late December 2018 with PAF. He saw [name] and considered ablation vs amiodarone.  He decided to pursue medical management.    Since I last saw him he has done well.  The patient denies any new symptoms such as chest discomfort, neck or arm discomfort. There has been no new shortness of breath, PND or orthopnea. There have been no reported presyncope or syncope.  He is still walking a treadmill.  He feels atrial fib rarely probably every other month for a few hours.  He takes a Cardizem and this seems to resolve his episodes.    The patient does not have symptoms concerning for COVID-19 infection (fever, chills, cough, or new shortness of breath).    Past Medical History:   Diagnosis Date  . Coronary artery disease    a. LHC on 04/06/16 which revealed 40% occl pRCA, 85% occl D1, 75% occl Ramus, 65% occl mLCx, 45% occl pLAD, 90% occl pLAD and nl LV function. s/p PCI/DES to prox LAD. Medical therapy for diag disease.    Marland Kitchen GERD (gastroesophageal reflux disease)   . Glaucoma   . Hemorrhoids    internal  . HTN (hypertension)   . Hx of colonic polyps    tubular adenoma   . Hyperlipidemia   . Kidney stone   . Melanoma of cheek ([place])   . Nephrolithiasis   . Pancolonic diverticulosis   . Paroxysmal atrial fibrillation (HCC)    a. s/p ablation 12/2014: on coumadin   Past Surgical History:  Procedure Laterality Date  . ATRIAL FIBRILLATION ABLATION N/A 11/12/2014   Procedure: ATRIAL FIBRILLATION ABLATION;  Surgeon: [name], MD;  Location: [facility] CATH LAB;  Service: Cardiovascular;  Laterality: N/A;  . BLADDER SURGERY     ruptured blood vessel in the bladder 3 weeks S/P ureter stones removed  . CARDIAC CATHETERIZATION N/A 04/06/2016   Procedure: Left Heart Cath and Coronary Angiography;  Surgeon: [name], MD;  Location: [facility] CV LAB;  Service: Cardiovascular;  Laterality: N/A;  . CARDIAC CATHETERIZATION N/A 04/06/2016   Procedure: Coronary Stent Intervention;  Surgeon: [name], MD;  Location: [facility] CV LAB;  Service: Cardiovascular;  Laterality: N/A;  . CATARACT EXTRACTION W/ INTRAOCULAR LENS  IMPLANT,  BILATERAL Bilateral   . COLONOSCOPY  06-29-12   per Dr. [name], adenomatous polyps, repeat in 3 yrs   . CORONARY ANGIOPLASTY WITH STENT PLACEMENT  04/06/2016   "1 stent"  . CYSTOSCOPY/RETROGRADE/URETEROSCOPY/STONE EXTRACTION WITH BASKET Left    removed 2 stones from left ureter  . INGUINAL HERNIA REPAIR Right 04/1990   Dr. [name]  . LAPAROSCOPIC CHOLECYSTECTOMY  02/1990   Dr. [name]  . MELANOMA EXCISION Right    "@ cheek near ear"  . TEE WITHOUT CARDIOVERSION N/A 11/11/2014   Procedure: TRANSESOPHAGEAL ECHOCARDIOGRAM (TEE);  Surgeon: [name], MD;  Location: [facility] ENDOSCOPY;  Service: Cardiovascular;  Laterality: N/A;  needs INR before case  . VASECTOMY       Current Meds  Medication Sig  . acetaminophen (TYLENOL) 325 MG tablet Take 325 mg by mouth 2 (two) times daily.   Marland Kitchen atorvastatin (LIPITOR) 80 MG tablet Take 1 tablet (80 mg total) by mouth daily. (Patient taking differently: Take 80 mg by mouth at bedtime. )  . cetirizine (ZYRTEC) 10 MG tablet Take 10 mg by mouth every morning.   . ciprofloxacin (CIPRO) 500 MG tablet Take 1 tablet (500 mg total) by mouth 2 (two) times daily.  Marland Kitchen diltiazem (CARDIZEM) 30 MG tablet Take 1 tablet every 4 hours AS NEEDED for afib heart rate over 100  . finasteride (PROSCAR) 5 MG tablet Take 5 mg daily by mouth.  Marland Kitchen HYDROcodone-homatropine (HYCODAN) 5-1.5 MG/5ML syrup Take 5 mLs by mouth every 4 (four) hours as needed for cough.  . latanoprost (XALATAN) 0.005 % ophthalmic solution Place 1 drop into both eyes at bedtime.   Marland Kitchen lisinopril (PRINIVIL,ZESTRIL) 10 MG tablet Take 10 mg by mouth at bedtime.   Marland Kitchen loratadine (CLARITIN) 10 MG tablet Take 10 mg by mouth at bedtime.   . metoprolol tartrate (LOPRESSOR) 12.5 mg TABS tablet Take 12.5 mg by mouth 2 (two) times daily.  . metroNIDAZOLE (FLAGYL) 500 MG tablet Take 1 tablet (500 mg total) by mouth 2 (two) times daily.  . mometasone (NASONEX) 50 MCG/ACT nasal spray Place 2 sprays into both nostrils at bedtime.   . Multiple Vitamins-Minerals (CENTRUM SILVER PO) Take 1 tablet by mouth every morning.   . nitroGLYCERIN (NITROSTAT) 0.4 MG SL tablet Place 1 tablet (0.4 mg total) under the tongue every 5 (five) minutes as needed for chest pain.  . pantoprazole (PROTONIX) 40 MG tablet Take 1 tablet (40 mg total) by mouth daily.  . potassium chloride (KLOR-CON) 10 MEQ CR tablet Take 10 mEq by mouth every morning.   . tamsulosin (FLOMAX) 0.4 MG CAPS Take 0.4 mg by mouth 2 (two) times daily.   Marland Kitchen warfarin (COUMADIN) 5 MG tablet TAKE 1  TO 1 AND 1/2 TABLETS BY  MOUTH DAILY AS DIRECTEDBY COUMADIN CLINIC     Allergies:   Sulfa antibiotics and Sulfamethoxazole   Social History   Tobacco Use  . Smoking status: Never Smoker  . Smokeless tobacco: Never Used  Substance Use Topics  . Alcohol use: No    Alcohol/week: 0.0 standard drinks  . Drug use: No     Family Hx: The patient's family history includes Heart attack (age of onset: 25) in his father; Heart disease in his father; Ovarian cancer in his mother.  ROS:   Please see the history of present illness.    As stated in the HPI and negative for all other systems.   Prior CV studies:   The following studies were reviewed today:  None  Labs/Other Tests and Data Reviewed:    EKG:  No ECG reviewed.  Recent Labs: 08/29/2018: ALT 29; BUN 22; Creatinine, Ser 1.02; Hemoglobin 12.9; Platelets 156.0; Potassium 4.1; Sodium 142; TSH 1.94   Recent Lipid Panel Lab Results  Component Value Date/Time   CHOL 132 08/29/2018 09:42 AM   TRIG 106.0 08/29/2018 09:42 AM   HDL 41.60 08/29/2018 09:42 AM   CHOLHDL 3 08/29/2018 09:42 AM   LDLCALC 69 08/29/2018 09:42 AM    Wt Readings from Last 3 Encounters:  04/19/19 197 lb (89.4 kg)  01/16/19 204 lb 6 oz (92.7 kg)  08/29/18 202 lb 4 oz (91.7 kg)     Objective:    Vital Signs:  BP (!) 142/65   Pulse (!) 56   Ht 5\' 11"  (1.803 m)   Wt 197 lb (89.4 kg)   BMI 27.48 kg/m    VITAL SIGNS:  reviewed  ASSESSMENT & PLAN:    PAF:   Mr. JUNIOR HUEZO has a CHA2DS2 - VASc score of 3.   No change in therapy.  He has non sustained infrequent episodes.    CAD:  The patient has no new sypmtoms.  No further cardiovascular testing is indicated.  We will continue with aggressive risk reduction and meds as listed.  HTN:  BP well controlled.      COVID-19 Education: The signs and symptoms of COVID-19 were discussed with the patient and how to seek care for testing (follow up with PCP or arrange E-visit).  The importance of social distancing was  discussed today.  Time:   Today, I have spent 16 minutes with the patient with telehealth technology discussing the above problems.     Medication Adjustments/Labs and Tests Ordered: Current medicines are reviewed at length with the patient today.  Concerns regarding medicines are outlined above.   Tests Ordered: No orders of the defined types were placed in this encounter.   Medication Changes: No orders of the defined types were placed in this encounter.   Disposition:  Follow up with me in the office in six months  Signed, [name], MD  04/19/2019 1:48 PM    [place]

## 2019-04-19 ENCOUNTER — Encounter: Payer: Self-pay | Admitting: Cardiology

## 2019-04-19 ENCOUNTER — Telehealth (INDEPENDENT_AMBULATORY_CARE_PROVIDER_SITE_OTHER): Payer: Medicare Other | Admitting: Cardiology

## 2019-04-19 VITALS — BP 142/65 | HR 56 | Ht 71.0 in | Wt 197.0 lb

## 2019-04-19 DIAGNOSIS — I480 Paroxysmal atrial fibrillation: Secondary | ICD-10-CM | POA: Diagnosis not present

## 2019-04-19 DIAGNOSIS — Z7189 Other specified counseling: Secondary | ICD-10-CM

## 2019-04-19 DIAGNOSIS — Z79899 Other long term (current) drug therapy: Secondary | ICD-10-CM

## 2019-04-19 NOTE — Patient Instructions (Signed)
Medication Instructions:  Continue current medications  If you need a refill on your cardiac medications before your next appointment, please call your pharmacy.  Labwork: CBC HERE IN OUR OFFICE AT [facility]  You will NOT need to fast   Take the provided lab slips with you to the lab for your blood draw.   When you have your labs (blood work) drawn today and your tests are completely normal, you will receive your results only by MyChart Message (if you have MyChart) -OR-  A paper copy in the mail.  If you have any lab test that is abnormal or we need to change your treatment, we will call you to review these results.  Testing/Procedures: None Ordered   Follow-Up: You will need a follow up appointment in 6 months.  Please call our office 2 months in advance to schedule this appointment.  You may see [name], MD or one of the following Advanced Practice Providers on your designated Care Team:   Rosaria Ferries, PA-C . Jory Sims, DNP, ANP     At [facility], you and your health needs are our priority.  As part of our continuing mission to provide you with exceptional heart care, we have created designated Provider Care Teams.  These Care Teams include your primary Cardiologist (physician) and Advanced Practice Providers (APPs -  Physician Assistants and Nurse Practitioners) who all work together to provide you with the care you need, when you need it.  Thank you for choosing [facility]!!

## 2019-04-23 ENCOUNTER — Telehealth: Payer: Self-pay

## 2019-04-23 NOTE — Telephone Encounter (Signed)

## 2019-04-26 DIAGNOSIS — H401132 Primary open-angle glaucoma, bilateral, moderate stage: Secondary | ICD-10-CM | POA: Diagnosis not present

## 2019-04-27 ENCOUNTER — Ambulatory Visit (INDEPENDENT_AMBULATORY_CARE_PROVIDER_SITE_OTHER): Payer: Medicare Other

## 2019-04-27 ENCOUNTER — Other Ambulatory Visit: Payer: Self-pay

## 2019-04-27 ENCOUNTER — Other Ambulatory Visit: Payer: Medicare Other | Admitting: *Deleted

## 2019-04-27 DIAGNOSIS — Z5181 Encounter for therapeutic drug level monitoring: Secondary | ICD-10-CM | POA: Diagnosis not present

## 2019-04-27 DIAGNOSIS — I480 Paroxysmal atrial fibrillation: Secondary | ICD-10-CM | POA: Diagnosis not present

## 2019-04-27 DIAGNOSIS — Z79899 Other long term (current) drug therapy: Secondary | ICD-10-CM | POA: Diagnosis not present

## 2019-04-27 LAB — CBC
Hematocrit: 38.2 % (ref 37.5–51.0)
Hemoglobin: 12.5 g/dL — ABNORMAL LOW (ref 13.0–17.7)
MCH: 31.7 pg (ref 26.6–33.0)
MCHC: 32.7 g/dL (ref 31.5–35.7)
MCV: 97 fL (ref 79–97)
Platelets: 209 10*3/uL (ref 150–450)
RBC: 3.94 x10E6/uL — ABNORMAL LOW (ref 4.14–5.80)
RDW: 12.5 % (ref 11.6–15.4)
WBC: 8.9 10*3/uL (ref 3.4–10.8)

## 2019-04-27 LAB — POCT INR: INR: 2.1 (ref 2.0–3.0)

## 2019-04-27 NOTE — Patient Instructions (Signed)
Description   Continue on same dosage 5mg s daily except 7.5mg  on Fridays.  Recheck in 6 weeks. Call with any questions or concerns [phone]

## 2019-05-23 ENCOUNTER — Encounter: Payer: Self-pay | Admitting: Family Medicine

## 2019-05-29 MED ORDER — OLANZAPINE 2.5 MG PO TABS: 2.5000 mg | ORAL_TABLET | Freq: Every day | ORAL | 2 refills | Status: DC

## 2019-05-29 NOTE — Telephone Encounter (Signed)
Tell him I have sent in a rx for Olanazapine for her to take every night at bedtime. He can tell her that I gave it to her to help her sleep. This should help with anxiety and with the hallucinations. We can increase the dose if needed. Have him give [place] an update in 2-3 weeks

## 2019-06-06 ENCOUNTER — Telehealth: Payer: Self-pay | Admitting: *Deleted

## 2019-06-06 NOTE — Telephone Encounter (Signed)

## 2019-06-08 ENCOUNTER — Other Ambulatory Visit: Payer: Self-pay

## 2019-06-08 ENCOUNTER — Ambulatory Visit (INDEPENDENT_AMBULATORY_CARE_PROVIDER_SITE_OTHER): Payer: Medicare Other | Admitting: Pharmacist

## 2019-06-08 DIAGNOSIS — I480 Paroxysmal atrial fibrillation: Secondary | ICD-10-CM

## 2019-06-08 DIAGNOSIS — Z5181 Encounter for therapeutic drug level monitoring: Secondary | ICD-10-CM | POA: Diagnosis not present

## 2019-06-08 LAB — POCT INR: INR: 2 (ref 2.0–3.0)

## 2019-06-08 NOTE — Patient Instructions (Signed)
Description   Continue on same dosage 5mg s daily except 7.5mg  on Fridays.  Recheck in 8 weeks. Call with any questions or concerns [phone]

## 2019-06-16 ENCOUNTER — Encounter: Payer: Self-pay | Admitting: Family Medicine

## 2019-06-18 NOTE — Telephone Encounter (Signed)
Tell him to try giving her 2 tabs of Olanzipine (total of 5 mg) at bedtime. She will need a PMV for any pain medication (this can be virtual if they wish)

## 2019-06-28 ENCOUNTER — Encounter: Payer: Self-pay | Admitting: Family Medicine

## 2019-06-29 NOTE — Telephone Encounter (Signed)
That sounds good, tell him to keep the Olanzipine at one pill at a time. Set up a telephone visit with his wife Edmonia Lynch for pain management

## 2019-07-03 ENCOUNTER — Encounter: Payer: Self-pay | Admitting: Family Medicine

## 2019-07-04 ENCOUNTER — Other Ambulatory Visit: Payer: Self-pay | Admitting: Family Medicine

## 2019-07-04 MED ORDER — HYDROCODONE-ACETAMINOPHEN 10-325 MG PO TABS: 1.0000 | ORAL_TABLET | Freq: Four times a day (QID) | ORAL | 0 refills | Status: DC | PRN

## 2019-07-04 NOTE — Telephone Encounter (Signed)
Note   Pharmacy Calling [facility] [address], [place] - [place] [phone] (Phone) [phone] (Fax)     Reason for call:  Pharmacist inquiring the dx reason for prescribing HYDROcodone-acetaminophen (NORCO) 10-325 MG tablet, please advise

## 2019-07-04 NOTE — Telephone Encounter (Signed)
I resent these to [facility]

## 2019-07-04 NOTE — Telephone Encounter (Signed)
Copied from [place] [id number]. Topic: Quick Communication - Rx Refill/Question >> Jul 04, 2019  4:45 PM Rainey Pines A wrote: Medication: HYDROcodone-acetaminophen (NORCO) 10-325 MG tablet (Patient stated that [facility] refused to refill medication. Patient asked that meds now be sent to  [facility].)  Has the patient contacted their pharmacy? Yes (Agent: If no, request that the patient contact the pharmacy for the refill.) (Agent: If yes, when and what did the pharmacy advise?)Contact PCP  Preferred Pharmacy (with phone number or street name): [facility] - [place], [place]. [phone] (Phone) [phone] (Fax)   Agent: Please be advised that RX refills may take up to 3 business days. We ask that you follow-up with your pharmacy.

## 2019-07-13 ENCOUNTER — Other Ambulatory Visit: Payer: Self-pay | Admitting: Cardiology

## 2019-07-13 NOTE — Telephone Encounter (Signed)
Refill request

## 2019-07-27 ENCOUNTER — Encounter: Payer: Self-pay | Admitting: Family Medicine

## 2019-07-27 ENCOUNTER — Ambulatory Visit (INDEPENDENT_AMBULATORY_CARE_PROVIDER_SITE_OTHER): Payer: Medicare Other

## 2019-07-27 DIAGNOSIS — Z23 Encounter for immunization: Secondary | ICD-10-CM | POA: Diagnosis not present

## 2019-07-27 NOTE — Telephone Encounter (Signed)
Please advise 

## 2019-07-29 NOTE — Telephone Encounter (Signed)
I did research the Relief Factor and it is very questionable as to whether it helps at all. However I do not think it would be dangerous for her to try and it should not interfere with her other medications

## 2019-07-31 ENCOUNTER — Encounter: Payer: Self-pay | Admitting: Family Medicine

## 2019-08-03 ENCOUNTER — Other Ambulatory Visit: Payer: Self-pay

## 2019-08-03 ENCOUNTER — Ambulatory Visit (INDEPENDENT_AMBULATORY_CARE_PROVIDER_SITE_OTHER): Payer: Medicare Other | Admitting: *Deleted

## 2019-08-03 DIAGNOSIS — I480 Paroxysmal atrial fibrillation: Secondary | ICD-10-CM | POA: Diagnosis not present

## 2019-08-03 DIAGNOSIS — Z5181 Encounter for therapeutic drug level monitoring: Secondary | ICD-10-CM

## 2019-08-03 LAB — POCT INR: INR: 3 (ref 2.0–3.0)

## 2019-08-03 NOTE — Patient Instructions (Signed)
Description   Continue on same dosage 5mgs daily except 7.5mg on Fridays.  Recheck in 8 weeks. Call with any questions or concerns [phone]     

## 2019-08-08 DIAGNOSIS — L814 Other melanin hyperpigmentation: Secondary | ICD-10-CM | POA: Diagnosis not present

## 2019-08-08 DIAGNOSIS — D229 Melanocytic nevi, unspecified: Secondary | ICD-10-CM | POA: Diagnosis not present

## 2019-08-08 DIAGNOSIS — L570 Actinic keratosis: Secondary | ICD-10-CM | POA: Diagnosis not present

## 2019-08-08 DIAGNOSIS — L578 Other skin changes due to chronic exposure to nonionizing radiation: Secondary | ICD-10-CM | POA: Diagnosis not present

## 2019-08-08 DIAGNOSIS — L821 Other seborrheic keratosis: Secondary | ICD-10-CM | POA: Diagnosis not present

## 2019-08-08 DIAGNOSIS — L819 Disorder of pigmentation, unspecified: Secondary | ICD-10-CM | POA: Diagnosis not present

## 2019-08-08 DIAGNOSIS — Z85820 Personal history of malignant melanoma of skin: Secondary | ICD-10-CM | POA: Diagnosis not present

## 2019-08-14 ENCOUNTER — Other Ambulatory Visit: Payer: Self-pay | Admitting: Family Medicine

## 2019-08-14 NOTE — Telephone Encounter (Signed)
Please advise for refill?  

## 2019-08-15 NOTE — Telephone Encounter (Signed)
This is an error. I prescribe the Olanzapine for his wife, Deryan Herin does NOT take this medication

## 2019-08-27 DIAGNOSIS — Z961 Presence of intraocular lens: Secondary | ICD-10-CM | POA: Diagnosis not present

## 2019-08-27 DIAGNOSIS — H401132 Primary open-angle glaucoma, bilateral, moderate stage: Secondary | ICD-10-CM | POA: Diagnosis not present

## 2019-08-28 ENCOUNTER — Telehealth: Payer: Self-pay | Admitting: *Deleted

## 2019-08-28 DIAGNOSIS — E782 Mixed hyperlipidemia: Secondary | ICD-10-CM

## 2019-08-28 DIAGNOSIS — N401 Enlarged prostate with lower urinary tract symptoms: Secondary | ICD-10-CM

## 2019-08-28 DIAGNOSIS — N138 Other obstructive and reflux uropathy: Secondary | ICD-10-CM

## 2019-08-28 NOTE — Telephone Encounter (Signed)
It is difficult to tell from his chart as he sees a number of other providers and the [place]. Can you see if he would be ok waiting until his physical with Dr. Sarajane Jews  -let him know Dr. Sarajane Jews is not in the office? If not, we would have to try to base off of last year, but insurance sometimes does not pay unless there is a specific diagnosis. Thank you.

## 2019-08-28 NOTE — Telephone Encounter (Signed)
Noted  

## 2019-08-28 NOTE — Telephone Encounter (Signed)
Copied from [place] [id number]. Topic: General - Other >> Aug 28, 2019 12:57 PM Pauline Good wrote: Reason for CRM: pt want to know if he can get his labs done on Friday morning due to his has appt with his urologist.   Patient called back. Can you please advise what labs need to be drawn by looking at past hx?

## 2019-08-28 NOTE — Telephone Encounter (Signed)
Patient notified of update  and verbalized understanding. Pt was advised to wait until Dr.Fry returns.

## 2019-08-31 ENCOUNTER — Encounter: Payer: Medicare Other | Admitting: Family Medicine

## 2019-09-03 NOTE — Telephone Encounter (Signed)
Please advise 

## 2019-09-03 NOTE — Telephone Encounter (Signed)
I ordered the labs. He can make a lab appt

## 2019-09-04 NOTE — Telephone Encounter (Signed)
Pt has been scheduled.  °

## 2019-09-05 ENCOUNTER — Other Ambulatory Visit (INDEPENDENT_AMBULATORY_CARE_PROVIDER_SITE_OTHER): Payer: Medicare Other

## 2019-09-05 ENCOUNTER — Other Ambulatory Visit: Payer: Self-pay

## 2019-09-05 DIAGNOSIS — N138 Other obstructive and reflux uropathy: Secondary | ICD-10-CM | POA: Diagnosis not present

## 2019-09-05 DIAGNOSIS — E782 Mixed hyperlipidemia: Secondary | ICD-10-CM

## 2019-09-05 DIAGNOSIS — N401 Enlarged prostate with lower urinary tract symptoms: Secondary | ICD-10-CM | POA: Diagnosis not present

## 2019-09-05 LAB — POC URINALSYSI DIPSTICK (AUTOMATED)
Glucose, UA: NEGATIVE
Leukocytes, UA: NEGATIVE
Protein, UA: NEGATIVE
Spec Grav, UA: 1.02 (ref 1.010–1.025)
Urobilinogen, UA: 0.2 E.U./dL
pH, UA: 5.5 (ref 5.0–8.0)

## 2019-09-05 LAB — CBC WITH DIFFERENTIAL/PLATELET
Basophils Absolute: 0.1 10*3/uL (ref 0.0–0.1)
Basophils Relative: 1 % (ref 0.0–3.0)
Eosinophils Absolute: 0.3 10*3/uL (ref 0.0–0.7)
Eosinophils Relative: 2.6 % (ref 0.0–5.0)
HCT: 38.7 % — ABNORMAL LOW (ref 39.0–52.0)
Hemoglobin: 12.9 g/dL — ABNORMAL LOW (ref 13.0–17.0)
Lymphocytes Relative: 21.7 % (ref 12.0–46.0)
Lymphs Abs: 2.5 10*3/uL (ref 0.7–4.0)
MCHC: 33.3 g/dL (ref 30.0–36.0)
MCV: 97.4 fl (ref 78.0–100.0)
Monocytes Absolute: 1 10*3/uL (ref 0.1–1.0)
Monocytes Relative: 8.9 % (ref 3.0–12.0)
Neutro Abs: 7.5 10*3/uL (ref 1.4–7.7)
Neutrophils Relative %: 65.8 % (ref 43.0–77.0)
Platelets: 179 10*3/uL (ref 150.0–400.0)
RBC: 3.97 Mil/uL — ABNORMAL LOW (ref 4.22–5.81)
RDW: 13.3 % (ref 11.5–15.5)
WBC: 11.4 10*3/uL — ABNORMAL HIGH (ref 4.0–10.5)

## 2019-09-05 LAB — HEPATIC FUNCTION PANEL
ALT: 30 U/L (ref 0–53)
AST: 30 U/L (ref 0–37)
Albumin: 4.3 g/dL (ref 3.5–5.2)
Alkaline Phosphatase: 55 U/L (ref 39–117)
Bilirubin, Direct: 0.1 mg/dL (ref 0.0–0.3)
Total Bilirubin: 0.7 mg/dL (ref 0.2–1.2)
Total Protein: 6.5 g/dL (ref 6.0–8.3)

## 2019-09-05 LAB — LIPID PANEL
Cholesterol: 158 mg/dL (ref 0–200)
HDL: 40.9 mg/dL (ref >39.00)
LDL Cholesterol: 85 mg/dL (ref 0–99)
NonHDL: 117.08
Total CHOL/HDL Ratio: 4
Triglycerides: 158 mg/dL — ABNORMAL HIGH (ref 0.0–149.0)
VLDL: 31.6 mg/dL (ref 0.0–40.0)

## 2019-09-05 LAB — BASIC METABOLIC PANEL
BUN: 28 mg/dL — ABNORMAL HIGH (ref 6–23)
CO2: 31 mEq/L (ref 19–32)
Calcium: 9.2 mg/dL (ref 8.4–10.5)
Chloride: 102 mEq/L (ref 96–112)
Creatinine, Ser: 1.08 mg/dL (ref 0.40–1.50)
GFR: 64.5 mL/min (ref >60.00)
Glucose, Bld: 108 mg/dL — ABNORMAL HIGH (ref 70–99)
Potassium: 4.4 mEq/L (ref 3.5–5.1)
Sodium: 140 mEq/L (ref 135–145)

## 2019-09-06 LAB — PSA: PSA: 0.12 ng/mL (ref 0.10–4.00)

## 2019-09-06 LAB — TSH: TSH: 1.68 u[IU]/mL (ref 0.35–4.50)

## 2019-09-07 ENCOUNTER — Encounter: Payer: Self-pay | Admitting: Family Medicine

## 2019-09-07 DIAGNOSIS — N401 Enlarged prostate with lower urinary tract symptoms: Secondary | ICD-10-CM | POA: Diagnosis not present

## 2019-09-07 DIAGNOSIS — N200 Calculus of kidney: Secondary | ICD-10-CM | POA: Diagnosis not present

## 2019-09-07 DIAGNOSIS — R3912 Poor urinary stream: Secondary | ICD-10-CM | POA: Diagnosis not present

## 2019-09-10 DIAGNOSIS — L819 Disorder of pigmentation, unspecified: Secondary | ICD-10-CM | POA: Diagnosis not present

## 2019-09-10 DIAGNOSIS — Z85828 Personal history of other malignant neoplasm of skin: Secondary | ICD-10-CM | POA: Diagnosis not present

## 2019-09-14 ENCOUNTER — Other Ambulatory Visit: Payer: Self-pay

## 2019-09-14 ENCOUNTER — Encounter: Payer: Self-pay | Admitting: Family Medicine

## 2019-09-14 ENCOUNTER — Ambulatory Visit (INDEPENDENT_AMBULATORY_CARE_PROVIDER_SITE_OTHER): Payer: Medicare Other | Admitting: Family Medicine

## 2019-09-14 VITALS — BP 140/78 | HR 67 | Temp 97.9°F | Ht 69.75 in | Wt 206.0 lb

## 2019-09-14 DIAGNOSIS — I10 Essential (primary) hypertension: Secondary | ICD-10-CM | POA: Diagnosis not present

## 2019-09-14 DIAGNOSIS — I480 Paroxysmal atrial fibrillation: Secondary | ICD-10-CM | POA: Diagnosis not present

## 2019-09-14 DIAGNOSIS — Z9861 Coronary angioplasty status: Secondary | ICD-10-CM

## 2019-09-14 DIAGNOSIS — I495 Sick sinus syndrome: Secondary | ICD-10-CM | POA: Diagnosis not present

## 2019-09-14 DIAGNOSIS — K219 Gastro-esophageal reflux disease without esophagitis: Secondary | ICD-10-CM

## 2019-09-14 DIAGNOSIS — I2510 Atherosclerotic heart disease of native coronary artery without angina pectoris: Secondary | ICD-10-CM | POA: Diagnosis not present

## 2019-09-14 DIAGNOSIS — E782 Mixed hyperlipidemia: Secondary | ICD-10-CM | POA: Diagnosis not present

## 2019-09-14 NOTE — Progress Notes (Signed)
Subjective:    Patient ID: [name] [name], male    DOB: May 30, 1929, 85 y.o.   MRN: [id number]  HPI Here to follow up on issues. He is doing well in general. He sees Dr. Junious Silk regularly for urologic checks. His recent PSA was stable. He sees Dr. Percival Spanish twice a year for HTN and PAT, which has been stable. His LDL looks great. He still gets his medications from the [place] clinic. He has been under stress from helping his wife who has a lot of medical problems.    Review of Systems  Constitutional: Negative.   HENT: Negative.   Eyes: Negative.   Respiratory: Negative.   Cardiovascular: Negative.   Gastrointestinal: Negative.   Genitourinary: Negative.   Musculoskeletal: Negative.   Skin: Negative.   Neurological: Negative.   Psychiatric/Behavioral: Negative.        Objective:   Physical Exam Constitutional:      General: He is not in acute distress.    Appearance: He is well-developed. He is not diaphoretic.  HENT:     Head: Normocephalic and atraumatic.     Right Ear: External ear normal.     Left Ear: External ear normal.     Nose: Nose normal.     Mouth/Throat:     Pharynx: No oropharyngeal exudate.  Eyes:     General: No scleral icterus.       Right eye: No discharge.        Left eye: No discharge.     Conjunctiva/sclera: Conjunctivae normal.     Pupils: Pupils are equal, round, and reactive to light.  Neck:     Musculoskeletal: Neck supple.     Thyroid: No thyromegaly.     Vascular: No JVD.     Trachea: No tracheal deviation.  Cardiovascular:     Rate and Rhythm: Normal rate and regular rhythm.     Heart sounds: Normal heart sounds. No murmur. No friction rub. No gallop.   Pulmonary:     Effort: Pulmonary effort is normal. No respiratory distress.     Breath sounds: Normal breath sounds. No wheezing or rales.  Chest:     Chest wall: No tenderness.  Abdominal:     General: Bowel sounds are normal. There is no distension.     Palpations: Abdomen is soft.  There is no mass.     Tenderness: There is no abdominal tenderness. There is no guarding or rebound.  Genitourinary:    Penis: No tenderness.   Musculoskeletal: Normal range of motion.        General: No tenderness.  Lymphadenopathy:     Cervical: No cervical adenopathy.  Skin:    General: Skin is warm and dry.     Coloration: Skin is not pale.     Findings: No erythema or rash.  Neurological:     Mental Status: He is alert and oriented to person, place, and time.     Cranial Nerves: No cranial nerve deficit.     Motor: No abnormal muscle tone.     Coordination: Coordination normal.     Deep Tendon Reflexes: Reflexes are normal and symmetric. Reflexes normal.  Psychiatric:        Behavior: Behavior normal.        Thought Content: Thought content normal.        Judgment: Judgment normal.           Assessment & Plan:  He is doing well. His HTN, CAD, PAT, and GERD  are stable. He will see Dr. Percival Spanish in the next few weeks.  [name], MD

## 2019-09-14 NOTE — Patient Instructions (Signed)
There are no preventive care reminders to display for this patient.  Depression screen [facility] 2/9 08/29/2018 01/20/2017 08/23/2014  Decreased Interest 0 0 0  Down, Depressed, Hopeless 0 0 0  PHQ - 2 Score 0 0 0  Some recent data might be hidden

## 2019-09-28 ENCOUNTER — Other Ambulatory Visit: Payer: Self-pay

## 2019-09-28 ENCOUNTER — Ambulatory Visit (INDEPENDENT_AMBULATORY_CARE_PROVIDER_SITE_OTHER): Payer: Medicare Other | Admitting: *Deleted

## 2019-09-28 DIAGNOSIS — Z5181 Encounter for therapeutic drug level monitoring: Secondary | ICD-10-CM | POA: Diagnosis not present

## 2019-09-28 DIAGNOSIS — I480 Paroxysmal atrial fibrillation: Secondary | ICD-10-CM

## 2019-09-28 LAB — POCT INR: INR: 2 (ref 2.0–3.0)

## 2019-09-28 NOTE — Patient Instructions (Signed)
Description   Continue on same dosage 5mgs daily except 7.5mg on Fridays.  Recheck in 8 weeks. Call with any questions or concerns [phone]     

## 2019-10-03 ENCOUNTER — Other Ambulatory Visit: Payer: Self-pay

## 2019-10-06 ENCOUNTER — Encounter: Payer: Self-pay | Admitting: Family Medicine

## 2019-10-15 DIAGNOSIS — Z7189 Other specified counseling: Secondary | ICD-10-CM | POA: Insufficient documentation

## 2019-10-15 DIAGNOSIS — I2510 Atherosclerotic heart disease of native coronary artery without angina pectoris: Secondary | ICD-10-CM | POA: Insufficient documentation

## 2019-10-15 NOTE — Progress Notes (Signed)
Cardiology Office Note   Date:  10/16/2019   ID:  [name] [name], DOB 1928/12/19, MRN [id number]  PCP:  [name], MD  Cardiologist:   [name], MD   Chief Complaint  Patient presents with  . Atrial Fibrillation      History of Present Illness: [name] [name] is a 85 y.o. male who who presents for follow up of PAF.  He was in the ED on late December 2018 with PAF. He saw [name] and considered ablation vs amiodarone.  He decided to pursue medical management.    Since I last saw him he has done okay.  He has been caring for his wife who has what sounds like nighttime delirium.  He is becoming more frail.  This is putting a stress on him and he is tired but he is very dedicated to her.  He has had only brief episodes of paroxysmal fibrillation with one episode lasted 8 hours. The patient denies any new symptoms such as chest discomfort, neck or arm discomfort. There has been no new shortness of breath, PND or orthopnea. There have been no reported palpitations, presyncope or syncope.    Past Medical History:  Diagnosis Date  . Coronary artery disease    a. LHC on 04/06/16 which revealed 40% occl pRCA, 85% occl D1, 75% occl Ramus, 65% occl mLCx, 45% occl pLAD, 90% occl pLAD and nl LV function. s/p PCI/DES to prox LAD. Medical therapy for diag disease.    Marland Kitchen GERD (gastroesophageal reflux disease)   . Glaucoma   . Hemorrhoids    internal  . HTN (hypertension)   . Hx of colonic polyps    tubular adenoma   . Hyperlipidemia   . Kidney stone   . Melanoma of cheek ([place])   . Nephrolithiasis   . Pancolonic diverticulosis   . Paroxysmal atrial fibrillation (HCC)    a. s/p ablation 12/2014: on coumadin    Past Surgical History:  Procedure Laterality Date  . ATRIAL FIBRILLATION ABLATION N/A 11/12/2014   Procedure: ATRIAL FIBRILLATION ABLATION;  Surgeon: [name], MD;  Location: [facility] CATH LAB;  Service: Cardiovascular;  Laterality: N/A;  . BLADDER SURGERY     ruptured  blood vessel in the bladder 3 weeks S/P ureter stones removed  . CARDIAC CATHETERIZATION N/A 04/06/2016   Procedure: Left Heart Cath and Coronary Angiography;  Surgeon: [name], MD;  Location: [place] CV LAB;  Service: Cardiovascular;  Laterality: N/A;  . CARDIAC CATHETERIZATION N/A 04/06/2016   Procedure: Coronary Stent Intervention;  Surgeon: [name], MD;  Location: [place] CV LAB;  Service: Cardiovascular;  Laterality: N/A;  . CATARACT EXTRACTION W/ INTRAOCULAR LENS  IMPLANT, BILATERAL Bilateral   . COLONOSCOPY  06-29-12   per Dr. [name], adenomatous polyps, repeat in 3 yrs   . CORONARY ANGIOPLASTY WITH STENT PLACEMENT  04/06/2016   "1 stent"  . CYSTOSCOPY/RETROGRADE/URETEROSCOPY/STONE EXTRACTION WITH BASKET Left    removed 2 stones from left ureter  . INGUINAL HERNIA REPAIR Right 04/1990   Dr. [name]  . LAPAROSCOPIC CHOLECYSTECTOMY  02/1990   Dr. [name]  . MELANOMA EXCISION Right    "@ cheek near ear"  . TEE WITHOUT CARDIOVERSION N/A 11/11/2014   Procedure: TRANSESOPHAGEAL ECHOCARDIOGRAM (TEE);  Surgeon: [name], MD;  Location: [facility] ENDOSCOPY;  Service: Cardiovascular;  Laterality: N/A;  needs INR before case  . VASECTOMY       Current Outpatient Medications  Medication Sig Dispense  Refill  . atorvastatin (LIPITOR) 80 MG tablet Take 1 tablet (80 mg total) by mouth daily. (Patient taking differently: Take 80 mg by mouth at bedtime. ) 90 tablet 3  . cetirizine (ZYRTEC) 10 MG tablet Take 10 mg by mouth every morning.     Marland Kitchen CLARITIN 10 MG CAPS     . [employer]S ACETAMINOPHEN 325 MG CAPS 1 Add'l Sig oral Select Frequency    . diltiazem (CARDIZEM) 30 MG tablet Take 1 tablet every 4 hours AS NEEDED for afib heart rate over 100 45 tablet 1  . finasteride (PROSCAR) 5 MG tablet Take 5 mg daily by mouth.    Marland Kitchen HYDROcodone-homatropine (HYCODAN) 5-1.5 MG/5ML syrup Take 5 mLs by mouth every 4 (four) hours as needed for cough. 240 mL 0  . latanoprost (XALATAN) 0.005 % ophthalmic  solution Apply to eye.    Marland Kitchen lisinopril (PRINIVIL,ZESTRIL) 10 MG tablet Take 10 mg by mouth at bedtime. Patient takes 1/2 tab    . metoprolol succinate (TOPROL-XL) 50 MG 24 hr tablet 1 Add'l Sig oral Select Frequency    . metoprolol tartrate (LOPRESSOR) 12.5 mg TABS tablet Take 12.5 mg by mouth 2 (two) times daily.    . mometasone (NASONEX) 50 MCG/ACT nasal spray Place 2 sprays into both nostrils at bedtime.     . Multiple Vitamins-Minerals (CENTRUM SILVER PO) Take 1 tablet by mouth every morning.     . nitroGLYCERIN (NITROSTAT) 0.4 MG SL tablet Place 1 tablet (0.4 mg total) under the tongue every 5 (five) minutes as needed for chest pain. 30 tablet 12  . pantoprazole (PROTONIX) 40 MG tablet Take 1 tablet (40 mg total) by mouth daily. 90 tablet 3  . potassium chloride (KLOR-CON) 10 MEQ CR tablet Take 10 mEq by mouth every morning.     . tamsulosin (FLOMAX) 0.4 MG CAPS Take 0.4 mg by mouth 2 (two) times daily.     . TOLAK 4 % CREA SMARTSIG:1 Sparingly Topical Daily    . warfarin (COUMADIN) 5 MG tablet TAKE 1 TO 1 AND 1/2 TABLETS BY MOUTH DAILY AS DIRECTED BY COUMADIN CLINIC 135 tablet 1   No current facility-administered medications for this visit.     Allergies:   Sulfa antibiotics and Sulfamethoxazole    ROS:  Please see the history of present illness.   Otherwise, review of systems are positive for none.   All other systems are reviewed and negative.    PHYSICAL EXAM: VS:  BP (!) 156/78   Pulse 68   Ht 5' 9.75" (1.772 m)   Wt 208 lb 9.6 oz (94.6 kg)   SpO2 96%   BMI 30.15 kg/m  , BMI Body mass index is 30.15 kg/m. GENERAL:  Well appearing NECK:  No jugular venous distention, waveform within normal limits, carotid upstroke brisk and symmetric, no bruits, no thyromegaly LUNGS:  Clear to auscultation bilaterally BACK:  No CVA tenderness HEART:  PMI not displaced or sustained,S1 and S2 within normal limits, no S3, no S4, no clicks, no rubs, no murmurs ABD:  Flat, positive bowel  sounds normal in frequency in pitch, no bruits, no rebound, no guarding, no midline pulsatile mass, no hepatomegaly, no splenomegaly EXT:  2 plus pulses throughout, no edema, no cyanosis no clubbing   EKG:  EKG is not ordered today.    Recent Labs: 09/05/2019: ALT 30; BUN 28; Creatinine, Ser 1.08; Hemoglobin 12.9; Platelets 179.0; Potassium 4.4; Sodium 140; TSH 1.68    Lipid Panel    Component Value  Date/Time   CHOL 158 09/05/2019 1012   TRIG 158.0 (H) 09/05/2019 1012   HDL 40.90 09/05/2019 1012   CHOLHDL 4 09/05/2019 1012   VLDL 31.6 09/05/2019 1012   LDLCALC 85 09/05/2019 1012      Wt Readings from Last 3 Encounters:  10/16/19 208 lb 9.6 oz (94.6 kg)  09/14/19 206 lb (93.4 kg)  04/19/19 197 lb (89.4 kg)      Other studies Reviewed: Additional studies/ records that were reviewed today include: Labs. Review of the above records demonstrates:  Please see elsewhere in the note.     ASSESSMENT AND PLAN:  PAF:     [name] has a CHA2DS2 - VASc score of 3.     Tolerates anticoagulation no further change in therapy is indicated.   CAD:   The patient has no new sypmtoms.  No further cardiovascular testing is indicated.  We will continue with aggressive risk reduction and meds as listed.  HTN:  BP is slightly elevated today but this is unusual.  We will continue the meds as listed.  I reviewed his blood pressure diary and his pressures are typically excellent.    Current medicines are reviewed at length with the patient today.  The patient does not have concerns regarding medicines.  The following changes have been made:  no change  Labs/ tests ordered today include: None No orders of the defined types were placed in this encounter.    Disposition:   FU with me in six months.     Signed, [name], MD  10/16/2019 2:08 PM    [facility]

## 2019-10-16 ENCOUNTER — Other Ambulatory Visit: Payer: Self-pay

## 2019-10-16 ENCOUNTER — Ambulatory Visit (INDEPENDENT_AMBULATORY_CARE_PROVIDER_SITE_OTHER): Payer: Medicare Other | Admitting: Cardiology

## 2019-10-16 ENCOUNTER — Encounter: Payer: Self-pay | Admitting: Cardiology

## 2019-10-16 VITALS — BP 156/78 | HR 68 | Ht 69.75 in | Wt 208.6 lb

## 2019-10-16 DIAGNOSIS — I2510 Atherosclerotic heart disease of native coronary artery without angina pectoris: Secondary | ICD-10-CM

## 2019-10-16 DIAGNOSIS — I10 Essential (primary) hypertension: Secondary | ICD-10-CM | POA: Diagnosis not present

## 2019-10-16 DIAGNOSIS — I480 Paroxysmal atrial fibrillation: Secondary | ICD-10-CM

## 2019-10-16 DIAGNOSIS — Z7189 Other specified counseling: Secondary | ICD-10-CM

## 2019-10-16 DIAGNOSIS — Z7901 Long term (current) use of anticoagulants: Secondary | ICD-10-CM

## 2019-10-16 NOTE — Patient Instructions (Signed)
Medication Instructions:  Your physician recommends that you continue on your current medications as directed. Please refer to the Current Medication list given to you today.  *If you need a refill on your cardiac medications before your next appointment, please call your pharmacy*  Lab Work: none If you have labs (blood work) drawn today and your tests are completely normal, you will receive your results only by: Marland Kitchen MyChart Message (if you have MyChart) OR . A paper copy in the mail If you have any lab test that is abnormal or we need to change your treatment, we will call you to review the results.  Testing/Procedures: none  Follow-Up: At [facility], you and your health needs are our priority.  As part of our continuing mission to provide you with exceptional heart care, we have created designated Provider Care Teams.  These Care Teams include your primary Cardiologist (physician) and Advanced Practice Providers (APPs -  Physician Assistants and Nurse Practitioners) who all work together to provide you with the care you need, when you need it.  Your next appointment:   6 month(s)  The format for your next appointment:   In Person  Provider:   You may see [name], MD or one of the following Advanced Practice Providers on your designated Care Team:    Rosaria Ferries, PA-C  Jory Sims, DNP, ANP  Cadence Kathlen Mody, NP

## 2019-10-17 ENCOUNTER — Encounter: Payer: Self-pay | Admitting: Family Medicine

## 2019-10-18 NOTE — Telephone Encounter (Signed)
I sent in the Cipro (for Tory Emerald)

## 2019-10-18 NOTE — Telephone Encounter (Signed)
Spoke to [place] to schedule spouse an appt. But he declined. He stated he can barely get pt to the restroom so there is no way he could get her in. Wants to know if Dr.Fry could call something in.

## 2019-11-16 ENCOUNTER — Encounter: Payer: Self-pay | Admitting: Family Medicine

## 2019-11-23 ENCOUNTER — Ambulatory Visit (INDEPENDENT_AMBULATORY_CARE_PROVIDER_SITE_OTHER): Payer: Medicare Other

## 2019-11-23 ENCOUNTER — Other Ambulatory Visit: Payer: Self-pay

## 2019-11-23 DIAGNOSIS — I480 Paroxysmal atrial fibrillation: Secondary | ICD-10-CM

## 2019-11-23 DIAGNOSIS — Z5181 Encounter for therapeutic drug level monitoring: Secondary | ICD-10-CM | POA: Diagnosis not present

## 2019-11-23 LAB — POCT INR: INR: 2.3 (ref 2.0–3.0)

## 2019-11-23 NOTE — Patient Instructions (Signed)
Description   Continue on same dosage 5mg s daily except 7.5mg  on Fridays.  Recheck in 8 weeks. Call with any questions or concerns [phone]

## 2019-12-25 ENCOUNTER — Encounter: Payer: Self-pay | Admitting: Family Medicine

## 2019-12-25 NOTE — Telephone Encounter (Signed)
Tell him to give her 2 tablets of Olanzepine every night instead of one

## 2019-12-28 DIAGNOSIS — H401132 Primary open-angle glaucoma, bilateral, moderate stage: Secondary | ICD-10-CM | POA: Diagnosis not present

## 2020-01-10 DIAGNOSIS — L578 Other skin changes due to chronic exposure to nonionizing radiation: Secondary | ICD-10-CM | POA: Diagnosis not present

## 2020-01-10 DIAGNOSIS — D1801 Hemangioma of skin and subcutaneous tissue: Secondary | ICD-10-CM | POA: Diagnosis not present

## 2020-01-10 DIAGNOSIS — L905 Scar conditions and fibrosis of skin: Secondary | ICD-10-CM | POA: Diagnosis not present

## 2020-01-10 DIAGNOSIS — L821 Other seborrheic keratosis: Secondary | ICD-10-CM | POA: Diagnosis not present

## 2020-01-10 DIAGNOSIS — D229 Melanocytic nevi, unspecified: Secondary | ICD-10-CM | POA: Diagnosis not present

## 2020-01-10 DIAGNOSIS — Z85820 Personal history of malignant melanoma of skin: Secondary | ICD-10-CM | POA: Diagnosis not present

## 2020-01-18 ENCOUNTER — Ambulatory Visit (INDEPENDENT_AMBULATORY_CARE_PROVIDER_SITE_OTHER): Payer: Medicare Other

## 2020-01-18 ENCOUNTER — Other Ambulatory Visit: Payer: Self-pay

## 2020-01-18 DIAGNOSIS — I480 Paroxysmal atrial fibrillation: Secondary | ICD-10-CM | POA: Diagnosis not present

## 2020-01-18 DIAGNOSIS — Z5181 Encounter for therapeutic drug level monitoring: Secondary | ICD-10-CM | POA: Diagnosis not present

## 2020-01-18 LAB — POCT INR: INR: 2.8 (ref 2.0–3.0)

## 2020-01-18 NOTE — Patient Instructions (Signed)
Description   Continue on same dosage 5mg s daily except 7.5mg  on Fridays.  Recheck in 8 weeks. Call with any questions or concerns [phone]

## 2020-01-24 ENCOUNTER — Encounter: Payer: Self-pay | Admitting: Family Medicine

## 2020-01-25 ENCOUNTER — Encounter: Payer: Self-pay | Admitting: Family Medicine

## 2020-01-25 NOTE — Telephone Encounter (Signed)
We will address all this on Monday

## 2020-01-29 ENCOUNTER — Encounter: Payer: Self-pay | Admitting: Family Medicine

## 2020-02-01 ENCOUNTER — Encounter: Payer: Self-pay | Admitting: Family Medicine

## 2020-02-13 ENCOUNTER — Encounter: Payer: Self-pay | Admitting: Family Medicine

## 2020-03-03 ENCOUNTER — Encounter: Payer: Self-pay | Admitting: Family Medicine

## 2020-03-04 NOTE — Telephone Encounter (Signed)
Please change this note to his wife's chart so I can answer it

## 2020-03-14 ENCOUNTER — Other Ambulatory Visit: Payer: Self-pay

## 2020-03-14 ENCOUNTER — Ambulatory Visit (INDEPENDENT_AMBULATORY_CARE_PROVIDER_SITE_OTHER): Payer: Medicare Other | Admitting: *Deleted

## 2020-03-14 DIAGNOSIS — Z5181 Encounter for therapeutic drug level monitoring: Secondary | ICD-10-CM

## 2020-03-14 DIAGNOSIS — I480 Paroxysmal atrial fibrillation: Secondary | ICD-10-CM

## 2020-03-14 LAB — POCT INR: INR: 2.4 (ref 2.0–3.0)

## 2020-03-14 NOTE — Patient Instructions (Signed)
Description   Continue on same dosage 5mg s daily except 7.5mg  on Fridays.  Recheck in 8 weeks. Call with any questions or concerns [phone]

## 2020-04-01 ENCOUNTER — Encounter: Payer: Self-pay | Admitting: Family Medicine

## 2020-04-02 NOTE — Telephone Encounter (Signed)
Francella Solian, [id number]  Please advise in Dr. Barbie Banner absence.

## 2020-04-15 ENCOUNTER — Other Ambulatory Visit: Payer: Self-pay | Admitting: Cardiology

## 2020-05-09 ENCOUNTER — Other Ambulatory Visit: Payer: Self-pay

## 2020-05-09 ENCOUNTER — Ambulatory Visit (INDEPENDENT_AMBULATORY_CARE_PROVIDER_SITE_OTHER): Payer: Medicare Other | Admitting: Pharmacist

## 2020-05-09 DIAGNOSIS — I480 Paroxysmal atrial fibrillation: Secondary | ICD-10-CM | POA: Diagnosis not present

## 2020-05-09 DIAGNOSIS — Z5181 Encounter for therapeutic drug level monitoring: Secondary | ICD-10-CM

## 2020-05-09 LAB — POCT INR: INR: 1.9 — AB (ref 2.0–3.0)

## 2020-05-09 NOTE — Patient Instructions (Signed)
Take 1.5 tablets today (normal dose) and 1.5 tablets tomorrow then continue on same dosage 5mg s daily except 7.5mg  on Fridays.  Recheck in 6 weeks. Call with any questions or concerns [phone]

## 2020-06-20 ENCOUNTER — Other Ambulatory Visit: Payer: Self-pay

## 2020-06-20 ENCOUNTER — Ambulatory Visit (INDEPENDENT_AMBULATORY_CARE_PROVIDER_SITE_OTHER): Payer: Medicare Other | Admitting: *Deleted

## 2020-06-20 DIAGNOSIS — Z5181 Encounter for therapeutic drug level monitoring: Secondary | ICD-10-CM | POA: Diagnosis not present

## 2020-06-20 DIAGNOSIS — I480 Paroxysmal atrial fibrillation: Secondary | ICD-10-CM | POA: Diagnosis not present

## 2020-06-20 LAB — POCT INR: INR: 1.9 — AB (ref 2.0–3.0)

## 2020-06-20 NOTE — Patient Instructions (Signed)
Description   Start taking 1 tablet daily except for 1.5 tablets on Mondays and Fridays. Recheck INR in 3 weeks. Call with any questions or concerns [phone]

## 2020-07-11 ENCOUNTER — Ambulatory Visit (INDEPENDENT_AMBULATORY_CARE_PROVIDER_SITE_OTHER): Payer: Medicare Other | Admitting: *Deleted

## 2020-07-11 ENCOUNTER — Other Ambulatory Visit: Payer: Self-pay

## 2020-07-11 DIAGNOSIS — I480 Paroxysmal atrial fibrillation: Secondary | ICD-10-CM

## 2020-07-11 DIAGNOSIS — Z5181 Encounter for therapeutic drug level monitoring: Secondary | ICD-10-CM

## 2020-07-11 LAB — POCT INR: INR: 2 (ref 2.0–3.0)

## 2020-07-11 NOTE — Patient Instructions (Signed)
Description   Continue 1 tablet daily except for 1.5 tablets on Mondays and Fridays. Recheck INR in 4 weeks. Call with any questions or concerns [phone]

## 2020-07-16 DIAGNOSIS — L905 Scar conditions and fibrosis of skin: Secondary | ICD-10-CM | POA: Diagnosis not present

## 2020-07-16 DIAGNOSIS — Z85828 Personal history of other malignant neoplasm of skin: Secondary | ICD-10-CM | POA: Diagnosis not present

## 2020-07-16 DIAGNOSIS — L570 Actinic keratosis: Secondary | ICD-10-CM | POA: Diagnosis not present

## 2020-07-16 DIAGNOSIS — L819 Disorder of pigmentation, unspecified: Secondary | ICD-10-CM | POA: Diagnosis not present

## 2020-08-08 ENCOUNTER — Ambulatory Visit (INDEPENDENT_AMBULATORY_CARE_PROVIDER_SITE_OTHER): Payer: Medicare Other | Admitting: *Deleted

## 2020-08-08 ENCOUNTER — Other Ambulatory Visit: Payer: Self-pay

## 2020-08-08 DIAGNOSIS — I480 Paroxysmal atrial fibrillation: Secondary | ICD-10-CM | POA: Diagnosis not present

## 2020-08-08 DIAGNOSIS — Z5181 Encounter for therapeutic drug level monitoring: Secondary | ICD-10-CM

## 2020-08-08 LAB — POCT INR: INR: 2.5 (ref 2.0–3.0)

## 2020-08-08 NOTE — Patient Instructions (Signed)
Description   Continue taking Warfarin 1 tablet daily except for 1.5 tablets on Mondays and Fridays. Recheck INR in 5 weeks. Call with any questions or concerns [phone]

## 2020-08-11 ENCOUNTER — Telehealth: Payer: Self-pay | Admitting: Family Medicine

## 2020-08-11 NOTE — Progress Notes (Signed)
  Chronic Care Management   Outreach Note  08/11/2020 Name: [name] [name] MRN: [id number] DOB: 18-Oct-1929  Referred by: [name], MD Reason for referral : No chief complaint on file.   An unsuccessful telephone outreach was attempted today. The patient was referred to the pharmacist for assistance with care management and care coordination.   Follow Up Plan:   Carley Perdue [employer] Scheduler

## 2020-08-12 ENCOUNTER — Telehealth: Payer: Self-pay | Admitting: Family Medicine

## 2020-08-12 NOTE — Progress Notes (Signed)
  Chronic Care Management   Note  08/12/2020 Name: [name] [name] MRN: [id number] DOB: 22-Aug-1929  [name] [name] is a 86 y.o. year old male who is a primary care patient of [name], MD. I reached out to [name] by phone today in response to a referral sent by [name] [name] PCP, [name], MD.   [name] [name] was given information about Chronic Care Management services today including:  1. CCM service includes personalized support from designated clinical staff supervised by his physician, including individualized plan of care and coordination with other care providers 2. 24/7 contact phone numbers for assistance for urgent and routine care needs. 3. Service will only be billed when office clinical staff spend 20 minutes or more in a month to coordinate care. 4. Only one practitioner may furnish and bill the service in a calendar month. 5. The patient may stop CCM services at any time (effective at the end of the month) by phone call to the office staff.   Patient agreed to services and verbal consent obtained.   Follow up plan:   Carley Perdue [employer] Scheduler

## 2020-08-13 DIAGNOSIS — Z23 Encounter for immunization: Secondary | ICD-10-CM | POA: Diagnosis not present

## 2020-08-27 NOTE — Progress Notes (Signed)
Cardiology Office Note   Date:  08/28/2020   ID:  [name] [name], DOB 16-May-1929, MRN [id number]  PCP:  [name], MD  Cardiologist:   [name], MD   Chief Complaint  Patient presents with  . Atrial Fibrillation      History of Present Illness: [name] [name] is a 86 y.o. male who who presents for follow up of PAF.  He was in the ED on late December 2018 with PAF. He saw [name] and considered ablation vs amiodarone.  He decided to pursue medical management.    Since I last saw him his wife of 53 years time. He is doing okay. He is taking care of himself and walking on the treadmill daily. He started to do a lot of traveling now. He has been out of rhythm twice he says once for an hour and once for 7 h. He will take a p.o. Cardizem and it will eventually go back into regular rhythm. He has had no chest pressure, neck or arm discomfort. Has had no new shortness of breath, PND or orthopnea. He has chronic lower extremity swelling.  Past Medical History:  Diagnosis Date  . Coronary artery disease    a. LHC on 04/06/16 which revealed 40% occl pRCA, 85% occl D1, 75% occl Ramus, 65% occl mLCx, 45% occl pLAD, 90% occl pLAD and nl LV function. s/p PCI/DES to prox LAD. Medical therapy for diag disease.    Marland Kitchen GERD (gastroesophageal reflux disease)   . Glaucoma   . Hemorrhoids    internal  . HTN (hypertension)   . Hx of colonic polyps    tubular adenoma   . Hyperlipidemia   . Kidney stone   . Melanoma of cheek ([place])   . Nephrolithiasis   . Pancolonic diverticulosis   . Paroxysmal atrial fibrillation (HCC)    a. s/p ablation 12/2014: on coumadin    Past Surgical History:  Procedure Laterality Date  . ATRIAL FIBRILLATION ABLATION N/A 11/12/2014   Procedure: ATRIAL FIBRILLATION ABLATION;  Surgeon: [name], MD;  Location: [facility] CATH LAB;  Service: Cardiovascular;  Laterality: N/A;  . BLADDER SURGERY     ruptured blood vessel in the bladder 3 weeks S/P ureter stones  removed  . CARDIAC CATHETERIZATION N/A 04/06/2016   Procedure: Left Heart Cath and Coronary Angiography;  Surgeon: [name], MD;  Location: [place] CV LAB;  Service: Cardiovascular;  Laterality: N/A;  . CARDIAC CATHETERIZATION N/A 04/06/2016   Procedure: Coronary Stent Intervention;  Surgeon: [name], MD;  Location: [place] CV LAB;  Service: Cardiovascular;  Laterality: N/A;  . CATARACT EXTRACTION W/ INTRAOCULAR LENS  IMPLANT, BILATERAL Bilateral   . COLONOSCOPY  06-29-12   per Dr. [name], adenomatous polyps, repeat in 3 yrs   . CORONARY ANGIOPLASTY WITH STENT PLACEMENT  04/06/2016   "1 stent"  . CYSTOSCOPY/RETROGRADE/URETEROSCOPY/STONE EXTRACTION WITH BASKET Left    removed 2 stones from left ureter  . INGUINAL HERNIA REPAIR Right 04/1990   Dr. [name]  . LAPAROSCOPIC CHOLECYSTECTOMY  02/1990   Dr. [name]  . MELANOMA EXCISION Right    "@ cheek near ear"  . TEE WITHOUT CARDIOVERSION N/A 11/11/2014   Procedure: TRANSESOPHAGEAL ECHOCARDIOGRAM (TEE);  Surgeon: [name], MD;  Location: [facility] ENDOSCOPY;  Service: Cardiovascular;  Laterality: N/A;  needs INR before case  . VASECTOMY       Current Outpatient Medications  Medication Sig Dispense Refill  . atorvastatin (LIPITOR) 80  MG tablet Take 1 tablet (80 mg total) by mouth daily. (Patient taking differently: Take 80 mg by mouth at bedtime. ) 90 tablet 3  . cetirizine (ZYRTEC) 10 MG tablet Take 10 mg by mouth every morning.     Marland Kitchen CLARITIN 10 MG CAPS     . CVS ACETAMINOPHEN 325 MG CAPS 1 Add'l Sig oral Select Frequency    . diltiazem (CARDIZEM) 30 MG tablet Take 1 tablet every 4 hours AS NEEDED for afib heart rate over 100 45 tablet 1  . finasteride (PROSCAR) 5 MG tablet Take 5 mg daily by mouth.    Marland Kitchen HYDROcodone-homatropine (HYCODAN) 5-1.5 MG/5ML syrup Take 5 mLs by mouth every 4 (four) hours as needed for cough. 240 mL 0  . latanoprost (XALATAN) 0.005 % ophthalmic solution Apply to eye.    Marland Kitchen lisinopril  (PRINIVIL,ZESTRIL) 10 MG tablet Take 10 mg by mouth at bedtime. Patient takes 1/2 tab    . metoprolol succinate (TOPROL-XL) 50 MG 24 hr tablet 1 Add'l Sig oral Select Frequency    . metoprolol tartrate (LOPRESSOR) 12.5 mg TABS tablet Take 12.5 mg by mouth 2 (two) times daily.    . mometasone (NASONEX) 50 MCG/ACT nasal spray Place 2 sprays into both nostrils at bedtime.     . Multiple Vitamins-Minerals (CENTRUM SILVER PO) Take 1 tablet by mouth every morning.     . nitroGLYCERIN (NITROSTAT) 0.4 MG SL tablet Place 1 tablet (0.4 mg total) under the tongue every 5 (five) minutes as needed for chest pain. 30 tablet 12  . pantoprazole (PROTONIX) 40 MG tablet Take 1 tablet (40 mg total) by mouth daily. 90 tablet 3  . potassium chloride (KLOR-CON) 10 MEQ CR tablet Take 10 mEq by mouth every morning.     . tamsulosin (FLOMAX) 0.4 MG CAPS Take 0.4 mg by mouth 2 (two) times daily.     . TOLAK 4 % CREA SMARTSIG:1 Sparingly Topical Daily    . warfarin (COUMADIN) 5 MG tablet TAKE 1 AND 1/2 TABLETS BY MOUTH DAILY ASDIRECTED 135 tablet 1   No current facility-administered medications for this visit.    Allergies:   Sulfa antibiotics and Sulfamethoxazole    ROS:  Please see the history of present illness.   Otherwise, review of systems are positive for none.   All other systems are reviewed and negative.    PHYSICAL EXAM: VS:  BP (!) 158/75   Pulse 68   Ht 5\' 11"  (1.803 m)   Wt 203 lb 9.6 oz (92.4 kg)   SpO2 97%   BMI 28.40 kg/m  , BMI Body mass index is 28.4 kg/m. GENERAL:  Well appearing NECK:  No jugular venous distention, waveform within normal limits, carotid upstroke brisk and symmetric, no bruits, no thyromegaly LUNGS:  Clear to auscultation bilaterally CHEST:  Unremarkable HEART:  PMI not displaced or sustained,S1 and S2 within normal limits, no S3, no S4, no clicks, no rubs, no murmurs ABD:  Flat, positive bowel sounds normal in frequency in pitch, no bruits, no rebound, no guarding, no  midline pulsatile mass, no hepatomegaly, no splenomegaly EXT:  2 plus pulses throughout, no edema, no cyanosis no clubbing   EKG:  EKG is ordered today. Sinus rhythm, rate 68, axis within normal limits, intervals within normal limits, no acute ST-T wave changes.   Recent Labs: 09/05/2019: ALT 30; BUN 28; Creatinine, Ser 1.08; Hemoglobin 12.9; Platelets 179.0; Potassium 4.4; Sodium 140; TSH 1.68    Lipid Panel    Component Value  Date/Time   CHOL 158 09/05/2019 1012   TRIG 158.0 (H) 09/05/2019 1012   HDL 40.90 09/05/2019 1012   CHOLHDL 4 09/05/2019 1012   VLDL 31.6 09/05/2019 1012   LDLCALC 85 09/05/2019 1012      Wt Readings from Last 3 Encounters:  08/28/20 203 lb 9.6 oz (92.4 kg)  10/16/19 208 lb 9.6 oz (94.6 kg)  09/14/19 206 lb (93.4 kg)      Other studies Reviewed: Additional studies/ records that were reviewed today include: Non. Review of the above records demonstrates:  Please see elsewhere in the note.     ASSESSMENT AND PLAN:  PAF:     [name] has a CHA2DS2 - VASc score of 3.    Will be checking a CBC. He tolerates anticoagulation and his short runs of atrial fib.  CAD:   He has had no chest discomfort. He will continue with risk reduction.  HTN:  BP is slightly elevated today but this is unusual. I reviewed his blood pressure readings from home and they are all within target.  Current medicines are reviewed at length with the patient today.  The patient does not have concerns regarding medicines.  The following changes have been made:  None  Labs/ tests ordered today include:   Orders Placed This Encounter  Procedures  . EKG 12-Lead     Disposition:   FU with me in 6 months.     Signed, [name], MD  08/28/2020 3:33 PM    [facility]

## 2020-08-28 ENCOUNTER — Encounter: Payer: Self-pay | Admitting: Cardiology

## 2020-08-28 ENCOUNTER — Ambulatory Visit (INDEPENDENT_AMBULATORY_CARE_PROVIDER_SITE_OTHER): Payer: Medicare Other | Admitting: Cardiology

## 2020-08-28 ENCOUNTER — Other Ambulatory Visit: Payer: Self-pay

## 2020-08-28 VITALS — BP 158/75 | HR 68 | Ht 71.0 in | Wt 203.6 lb

## 2020-08-28 DIAGNOSIS — I480 Paroxysmal atrial fibrillation: Secondary | ICD-10-CM

## 2020-08-28 DIAGNOSIS — I10 Essential (primary) hypertension: Secondary | ICD-10-CM | POA: Diagnosis not present

## 2020-08-28 DIAGNOSIS — I2510 Atherosclerotic heart disease of native coronary artery without angina pectoris: Secondary | ICD-10-CM | POA: Diagnosis not present

## 2020-08-28 NOTE — Patient Instructions (Signed)
Medication Instructions:  No changes *If you need a refill on your cardiac medications before your next appointment, please call your pharmacy*   Lab Work: Lab work at PCP in around 2 weeks If you have labs (blood work) drawn today and your tests are completely normal, you will receive your results only by: Marland Kitchen MyChart Message (if you have MyChart) OR . A paper copy in the mail If you have any lab test that is abnormal or we need to change your treatment, we will call you to review the results.   Testing/Procedures: None ordered   Follow-Up: At [facility], you and your health needs are our priority.  As part of our continuing mission to provide you with exceptional heart care, we have created designated Provider Care Teams.  These Care Teams include your primary Cardiologist (physician) and Advanced Practice Providers (APPs -  Physician Assistants and Nurse Practitioners) who all work together to provide you with the care you need, when you need it.  We recommend signing up for the patient portal called "MyChart".  Sign up information is provided on this After Visit Summary.  MyChart is used to connect with patients for Virtual Visits (Telemedicine).  Patients are able to view lab/test results, encounter notes, upcoming appointments, etc.  Non-urgent messages can be sent to your provider as well.   To learn more about what you can do with MyChart, go to [email or web address].    Your next appointment:   6 month(s)  The format for your next appointment:   In Person  Provider:   [name], MD   Other Instructions None

## 2020-08-29 DIAGNOSIS — Z961 Presence of intraocular lens: Secondary | ICD-10-CM | POA: Diagnosis not present

## 2020-08-29 DIAGNOSIS — H401132 Primary open-angle glaucoma, bilateral, moderate stage: Secondary | ICD-10-CM | POA: Diagnosis not present

## 2020-09-07 DIAGNOSIS — Z23 Encounter for immunization: Secondary | ICD-10-CM | POA: Diagnosis not present

## 2020-09-12 ENCOUNTER — Ambulatory Visit (INDEPENDENT_AMBULATORY_CARE_PROVIDER_SITE_OTHER): Payer: Medicare Other | Admitting: *Deleted

## 2020-09-12 ENCOUNTER — Other Ambulatory Visit: Payer: Self-pay

## 2020-09-12 DIAGNOSIS — I480 Paroxysmal atrial fibrillation: Secondary | ICD-10-CM | POA: Diagnosis not present

## 2020-09-12 DIAGNOSIS — Z5181 Encounter for therapeutic drug level monitoring: Secondary | ICD-10-CM | POA: Diagnosis not present

## 2020-09-12 LAB — POCT INR: INR: 2.2 (ref 2.0–3.0)

## 2020-09-12 NOTE — Patient Instructions (Signed)
Description   Continue taking Warfarin 1 tablet daily except for 1.5 tablets on Mondays and Fridays.  Recheck INR in 6 weeks. Call with any questions or concerns [phone]      

## 2020-09-15 ENCOUNTER — Ambulatory Visit (INDEPENDENT_AMBULATORY_CARE_PROVIDER_SITE_OTHER): Payer: Medicare Other | Admitting: Family Medicine

## 2020-09-15 ENCOUNTER — Other Ambulatory Visit: Payer: Self-pay

## 2020-09-15 ENCOUNTER — Encounter: Payer: Self-pay | Admitting: Family Medicine

## 2020-09-15 VITALS — BP 120/70 | HR 60 | Temp 98.4°F | Ht 70.0 in | Wt 198.8 lb

## 2020-09-15 DIAGNOSIS — N401 Enlarged prostate with lower urinary tract symptoms: Secondary | ICD-10-CM

## 2020-09-15 DIAGNOSIS — I2510 Atherosclerotic heart disease of native coronary artery without angina pectoris: Secondary | ICD-10-CM

## 2020-09-15 DIAGNOSIS — I10 Essential (primary) hypertension: Secondary | ICD-10-CM | POA: Diagnosis not present

## 2020-09-15 DIAGNOSIS — R739 Hyperglycemia, unspecified: Secondary | ICD-10-CM | POA: Diagnosis not present

## 2020-09-15 DIAGNOSIS — I480 Paroxysmal atrial fibrillation: Secondary | ICD-10-CM

## 2020-09-15 DIAGNOSIS — Z9861 Coronary angioplasty status: Secondary | ICD-10-CM

## 2020-09-15 DIAGNOSIS — E782 Mixed hyperlipidemia: Secondary | ICD-10-CM

## 2020-09-15 DIAGNOSIS — N138 Other obstructive and reflux uropathy: Secondary | ICD-10-CM

## 2020-09-15 DIAGNOSIS — K219 Gastro-esophageal reflux disease without esophagitis: Secondary | ICD-10-CM

## 2020-09-15 NOTE — Progress Notes (Signed)
Subjective:    Patient ID: [name] [name], male    DOB: Apr 05, 1929, 86 y.o.   MRN: [id number]  HPI Here to follow up on issues. He saw Dr. Percival Spanish last month to follow up on PAF and HTN. He was doing well. He has felt 2 episodes of fibrillation in the past 6 months. Each was brief and each was ended by taking a prn Cardizem. His BP has been stable at home. He walks 30 minutes on his treadmill 2-3 days a week. His wife passed away earlier this year, and this has been tough for him. But he seems to have adjusted fairly well. He still keep sin touch with friends and goes to church every week. He will see Dr. Junious Silk tomorrow for a urology follow up.   Review of Systems  Constitutional: Negative.   HENT: Negative.   Eyes: Negative.   Respiratory: Negative.   Cardiovascular: Negative.   Gastrointestinal: Negative.   Genitourinary: Negative.   Musculoskeletal: Negative.   Skin: Negative.   Neurological: Negative.   Psychiatric/Behavioral: Negative.        Objective:   Physical Exam Constitutional:      General: He is not in acute distress.    Appearance: He is well-developed. He is not diaphoretic.  HENT:     Head: Normocephalic and atraumatic.     Right Ear: External ear normal.     Left Ear: External ear normal.     Nose: Nose normal.     Mouth/Throat:     Pharynx: No oropharyngeal exudate.  Eyes:     General: No scleral icterus.       Right eye: No discharge.        Left eye: No discharge.     Conjunctiva/sclera: Conjunctivae normal.     Pupils: Pupils are equal, round, and reactive to light.  Neck:     Thyroid: No thyromegaly.     Vascular: No JVD.     Trachea: No tracheal deviation.  Cardiovascular:     Rate and Rhythm: Normal rate and regular rhythm.     Heart sounds: Normal heart sounds. No murmur heard.  No friction rub. No gallop.   Pulmonary:     Effort: Pulmonary effort is normal. No respiratory distress.     Breath sounds: Normal breath sounds. No wheezing  or rales.  Chest:     Chest wall: No tenderness.  Abdominal:     General: Bowel sounds are normal. There is no distension.     Palpations: Abdomen is soft. There is no mass.     Tenderness: There is no abdominal tenderness. There is no guarding or rebound.  Genitourinary:    Penis: No tenderness.   Musculoskeletal:        General: No tenderness. Normal range of motion.     Cervical back: Neck supple.  Lymphadenopathy:     Cervical: No cervical adenopathy.  Skin:    General: Skin is warm and dry.     Coloration: Skin is not pale.     Findings: No erythema or rash.  Neurological:     Mental Status: He is alert and oriented to person, place, and time.     Cranial Nerves: No cranial nerve deficit.     Motor: No abnormal muscle tone.     Coordination: Coordination normal.     Deep Tendon Reflexes: Reflexes are normal and symmetric. Reflexes normal.  Psychiatric:        Behavior: Behavior normal.  Thought Content: Thought content normal.        Judgment: Judgment normal.           Assessment & Plan:  He is adjusting well after losing his wife. His HTN and PAF are stable. His BPH is stable. We will get fasting labs to check lipids, PSA, etc. He is UTD on immunizations. [name], MD

## 2020-09-16 ENCOUNTER — Encounter: Payer: Self-pay | Admitting: Family Medicine

## 2020-09-16 DIAGNOSIS — R059 Cough, unspecified: Secondary | ICD-10-CM

## 2020-09-16 LAB — HEPATIC FUNCTION PANEL
AG Ratio: 1.8 (calc) (ref 1.0–2.5)
ALT: 34 U/L (ref 9–46)
AST: 41 U/L — ABNORMAL HIGH (ref 10–35)
Albumin: 4.4 g/dL (ref 3.6–5.1)
Alkaline phosphatase (APISO): 76 U/L (ref 35–144)
Bilirubin, Direct: 0.2 mg/dL (ref 0.0–0.2)
Globulin: 2.5 g/dL (ref 1.9–3.7)
Indirect Bilirubin: 0.6 mg/dL (ref 0.2–1.2)
Total Bilirubin: 0.8 mg/dL (ref 0.2–1.2)
Total Protein: 6.9 g/dL (ref 6.1–8.1)

## 2020-09-16 LAB — BASIC METABOLIC PANEL WITH GFR
BUN: 19 mg/dL (ref 7–25)
CO2: 27 mmol/L (ref 20–32)
Calcium: 9.7 mg/dL (ref 8.6–10.3)
Chloride: 98 mmol/L (ref 98–110)
Creat: 1.07 mg/dL (ref 0.70–1.11)
GFR, Est African American: 71 mL/min/{1.73_m2} (ref >=60)
GFR, Est Non African American: 61 mL/min/{1.73_m2} (ref >=60)
Glucose, Bld: 111 mg/dL — ABNORMAL HIGH (ref 65–99)
Potassium: 5.1 mmol/L (ref 3.5–5.3)
Sodium: 137 mmol/L (ref 135–146)

## 2020-09-16 LAB — CBC WITH DIFFERENTIAL/PLATELET
Absolute Monocytes: 959 cells/uL — ABNORMAL HIGH (ref 200–950)
Basophils Absolute: 22 cells/uL (ref 0–200)
Basophils Relative: 0.2 %
Eosinophils Absolute: 294 cells/uL (ref 15–500)
Eosinophils Relative: 2.7 %
HCT: 42.1 % (ref 38.5–50.0)
Hemoglobin: 14.1 g/dL (ref 13.2–17.1)
Lymphs Abs: 1711 cells/uL (ref 850–3900)
MCH: 32.9 pg (ref 27.0–33.0)
MCHC: 33.5 g/dL (ref 32.0–36.0)
MCV: 98.1 fL (ref 80.0–100.0)
MPV: 10.4 fL (ref 7.5–12.5)
Monocytes Relative: 8.8 %
Neutro Abs: 7913 cells/uL — ABNORMAL HIGH (ref 1500–7800)
Neutrophils Relative %: 72.6 %
Platelets: 227 10*3/uL (ref 140–400)
RBC: 4.29 10*6/uL (ref 4.20–5.80)
RDW: 12.1 % (ref 11.0–15.0)
Total Lymphocyte: 15.7 %
WBC: 10.9 10*3/uL — ABNORMAL HIGH (ref 3.8–10.8)

## 2020-09-16 LAB — PSA: PSA: 0.19 ng/mL (ref <=4.0)

## 2020-09-16 LAB — HEMOGLOBIN A1C
Hgb A1c MFr Bld: 6.2 %{Hb} — ABNORMAL HIGH
Mean Plasma Glucose: 131 (calc)
eAG (mmol/L): 7.3 (calc)

## 2020-09-16 LAB — LIPID PANEL
Cholesterol: 143 mg/dL
HDL: 47 mg/dL
LDL Cholesterol (Calc): 74 mg/dL
Non-HDL Cholesterol (Calc): 96 mg/dL
Total CHOL/HDL Ratio: 3 (calc)
Triglycerides: 135 mg/dL

## 2020-09-16 LAB — TSH: TSH: 1.93 mIU/L (ref 0.40–4.50)

## 2020-09-16 MED ORDER — HYDROCODONE-HOMATROPINE 5-1.5 MG/5ML PO SYRP: 5.0000 mL | ORAL_SOLUTION | ORAL | 0 refills | Status: DC | PRN

## 2020-09-16 MED ORDER — AZITHROMYCIN 250 MG PO TABS: ORAL_TABLET | ORAL | 0 refills | Status: DC

## 2020-09-16 NOTE — Telephone Encounter (Signed)
I sent in a Zpack and the cough syrup

## 2020-09-17 ENCOUNTER — Encounter: Payer: Self-pay | Admitting: Family Medicine

## 2020-09-18 NOTE — Telephone Encounter (Signed)
Use Delsym for the cough. Add Mucinex 1200 mg BID

## 2020-09-21 ENCOUNTER — Encounter: Payer: Self-pay | Admitting: Family Medicine

## 2020-09-22 MED ORDER — AZITHROMYCIN 250 MG PO TABS
ORAL_TABLET | ORAL | 0 refills | Status: DC
Start: 2020-09-22 — End: 2020-10-14

## 2020-09-22 NOTE — Telephone Encounter (Signed)
Please call in another Zpack

## 2020-09-23 ENCOUNTER — Encounter: Payer: Self-pay | Admitting: Family Medicine

## 2020-09-23 DIAGNOSIS — I10 Essential (primary) hypertension: Secondary | ICD-10-CM

## 2020-09-23 DIAGNOSIS — R059 Cough, unspecified: Secondary | ICD-10-CM

## 2020-09-23 DIAGNOSIS — I2510 Atherosclerotic heart disease of native coronary artery without angina pectoris: Secondary | ICD-10-CM

## 2020-09-23 MED ORDER — HYDROCODONE-HOMATROPINE 5-1.5 MG/5ML PO SYRP: 5.0000 mL | ORAL_SOLUTION | ORAL | 0 refills | Status: DC | PRN

## 2020-09-23 NOTE — Telephone Encounter (Signed)
I sent it to the [facility]

## 2020-10-13 ENCOUNTER — Telehealth: Payer: Self-pay | Admitting: Pharmacist

## 2020-10-13 NOTE — Progress Notes (Signed)
Chronic Care Management Pharmacy Assistant   Name: [name] [name]  MRN: [id number] DOB: 09/10/1929  Reason for Encounter: Initial questions   Patient Questions:  1.  Have you seen any other providers since your last visit? No  2.  Any changes in your medicines or health? No   [name] [name],  86 y.o. , male presents for their Initial CCM visit with the clinical pharmacist via telephone.  PCP : [name] Morale, MD  Allergies:   Allergies  Allergen Reactions  . Sulfa Antibiotics Other (See Comments)    Severe peeling of the skin on the groin area  . Sulfamethoxazole Other (See Comments)    REACTION: rash    Medications: Outpatient Encounter Medications as of 10/13/2020  Medication Sig  . atorvastatin (LIPITOR) 80 MG tablet Take 1 tablet (80 mg total) by mouth daily. (Patient taking differently: Take 80 mg by mouth at bedtime. )  . azithromycin (ZITHROMAX Z-PAK) 250 MG tablet As directed  . cetirizine (ZYRTEC) 10 MG tablet Take 10 mg by mouth every morning.   Marland Kitchen CLARITIN 10 MG CAPS   . [facility] ACETAMINOPHEN 325 MG CAPS 1 Add'l Sig oral Select Frequency  . diltiazem (CARDIZEM) 30 MG tablet Take 1 tablet every 4 hours AS NEEDED for afib heart rate over 100  . finasteride (PROSCAR) 5 MG tablet Take 5 mg daily by mouth.  Marland Kitchen HYDROcodone-homatropine (HYCODAN) 5-1.5 MG/5ML syrup Take 5 mLs by mouth every 4 (four) hours as needed for cough.  . latanoprost (XALATAN) 0.005 % ophthalmic solution Apply to eye.  Marland Kitchen lisinopril (PRINIVIL,ZESTRIL) 10 MG tablet Take 10 mg by mouth at bedtime. Patient takes 1/2 tab  . metoprolol succinate (TOPROL-XL) 50 MG 24 hr tablet 1 Add'l Sig oral Select Frequency  . metoprolol tartrate (LOPRESSOR) 12.5 mg TABS tablet Take 12.5 mg by mouth 2 (two) times daily.  . mometasone (NASONEX) 50 MCG/ACT nasal spray Place 2 sprays into both nostrils at bedtime.   . Multiple Vitamins-Minerals (CENTRUM SILVER PO) Take 1 tablet by mouth every morning.   . nitroGLYCERIN  (NITROSTAT) 0.4 MG SL tablet Place 1 tablet (0.4 mg total) under the tongue every 5 (five) minutes as needed for chest pain.  . pantoprazole (PROTONIX) 40 MG tablet Take 1 tablet (40 mg total) by mouth daily.  . potassium chloride (KLOR-CON) 10 MEQ CR tablet Take 10 mEq by mouth every morning.   . tamsulosin (FLOMAX) 0.4 MG CAPS Take 0.4 mg by mouth 2 (two) times daily.   . TOLAK 4 % CREA SMARTSIG:1 Sparingly Topical Daily  . warfarin (COUMADIN) 5 MG tablet TAKE 1 AND 1/2 TABLETS BY MOUTH DAILY ASDIRECTED   No facility-administered encounter medications on file as of 10/13/2020.    Current Diagnosis: Patient Active Problem List   Diagnosis Date Noted  . Coronary artery disease involving native coronary artery of native heart without angina pectoris 10/15/2019  . Educated about COVID-19 virus infection 10/15/2019  . Near syncope 06/10/2017  . Chest pain with moderate risk for cardiac etiology 06/10/2017  . CAD S/P percutaneous coronary angioplasty   . Abnormal stress test   . Paroxysmal atrial fibrillation ([place]) 11/12/2014  . Encounter for therapeutic drug monitoring 12/03/2013  . PVC's (premature ventricular contractions) 01/26/2012  . Tachycardia-bradycardia syndrome ([place]) 01/26/2012  . Long term (current) use of anticoagulants 09/06/2011  . GERD 08/01/2007  . History of colonic polyps 08/01/2007  . Personal history of urinary calculi 08/01/2007  . BPH with urinary obstruction 08/01/2007  .  Hyperlipidemia 07/04/2007  . Essential hypertension 07/04/2007  . ALLERGIC RHINITIS 07/04/2007    Goals Addressed   None     Follow-Up:  Pharmacist Review   Have you seen any other providers since your last visit? **no Any changes in your medications or health? no Any side effects from any medications? no Do you have an symptoms or problems not managed by your medications? no Any concerns about your health right now? no Has your provider asked that you check blood pressure, blood  sugar, or follow special diet at home? Yes, The patient stated that he does check his blood pressure 2/3 times weekly his last 3 reading are 124/63, 134/62, 135/65 Do you get any type of exercise on a regular basis? Yes, The patient stated that he walks on his treadmill 2/3 times weekly.  Can you think of a goal you would like to reach for your health? No Do you have any problems getting your medications? no Is there anything that you would like to discuss during the appointment? No  Please bring medications and supplements to appointment   [name] [name], [facility]  Practice Team Manager/ CPA (Clinical Pharmacist Assistant) [phone]

## 2020-10-14 ENCOUNTER — Ambulatory Visit: Payer: Medicare Other | Admitting: Pharmacist

## 2020-10-14 DIAGNOSIS — I10 Essential (primary) hypertension: Secondary | ICD-10-CM

## 2020-10-14 DIAGNOSIS — N138 Other obstructive and reflux uropathy: Secondary | ICD-10-CM

## 2020-10-14 NOTE — Chronic Care Management (AMB) (Signed)
Chronic Care Management Pharmacy  Name: [name] [name]  MRN: [id number] DOB: May 13, 1929  Initial Planning Appointment: completed 10/13/20  Initial Questions: 1. Have you seen any other providers since your last visit? n/a 2. Any changes in your medicines or health? No   Chief Complaint/ HPI  [name] [name],  86 y.o. , male presents for their Initial CCM visit with the clinical pharmacist via telephone due to COVID-19 Pandemic.  PCP : [name] Morale, MD  Their chronic conditions include: HTN, Afib, HLD, CAD, BPH, GERD, allergic rhinitis, glaucoma  Office Visits: -09/15/20 [name] Penna, MD: Patient presented for annual exam. A1c elevated at 6.2%. All other labs were WNL.   Consult Visit: -09/12/20 [name] Shock, RN (cardiology): Patient presented for anti-coag visit. INR 2.2, goal 2-3. Warfarin dose of 7.5 mg on Mon and Fri and 5 mg on all other days. Follow up in 6 weeks.  -08/29/20 [name] [name] (ophthalmology): Patient presented for eye exam.  -08/28/20 [name] Breeding, MD (cardiology): Patient presented for Afib follow up. Follow up in 6 months.  -04/28/20 [name] [name] (ophthalmology): Patient presented for glaucoma evaluation.  -01/10/20 [name] [name] (dermatology): Patient presented for follow up. Unable to access notes.  Medications: Outpatient Encounter Medications as of 10/14/2020  Medication Sig  . atorvastatin (LIPITOR) 80 MG tablet Take 1 tablet (80 mg total) by mouth daily. (Patient taking differently: Take 80 mg by mouth at bedtime. )  . cetirizine (ZYRTEC) 10 MG tablet Take 10 mg by mouth every morning.   [name] [name] CLARITIN 10 MG CAPS Take 1 capsule by mouth daily.   . [facility] ACETAMINOPHEN 325 MG CAPS Take 1 capsule by mouth 2 (two) times daily.   [name] [name] diltiazem (CARDIZEM) 30 MG tablet Take 1 tablet every 4 hours AS NEEDED for afib heart rate over 100  . finasteride (PROSCAR) 5 MG tablet Take 5 mg daily by mouth.  . latanoprost (XALATAN) 0.005 % ophthalmic solution Place 1 drop into  both eyes at bedtime.   [name] [name] lisinopril (PRINIVIL,ZESTRIL) 10 MG tablet Take 10 mg by mouth at bedtime.   . metoprolol tartrate (LOPRESSOR) 12.5 mg TABS tablet Take 12.5 mg by mouth 2 (two) times daily.  . mometasone (NASONEX) 50 MCG/ACT nasal spray Place 2 sprays into both nostrils daily as needed (allergies).   . Multiple Vitamins-Minerals (CENTRUM SILVER PO) Take 1 tablet by mouth every morning.   . nitroGLYCERIN (NITROSTAT) 0.4 MG SL tablet Place 1 tablet (0.4 mg total) under the tongue every 5 (five) minutes as needed for chest pain.  . pantoprazole (PROTONIX) 40 MG tablet Take 1 tablet (40 mg total) by mouth daily.  . potassium chloride (KLOR-CON) 10 MEQ CR tablet Take 10 mEq by mouth every morning.   . tamsulosin (FLOMAX) 0.4 MG CAPS Take 0.4 mg by mouth 2 (two) times daily.   . TOLAK 4 % CREA Apply 1 application topically daily as needed.   . warfarin (COUMADIN) 5 MG tablet TAKE 1 AND 1/2 TABLETS BY MOUTH DAILY ASDIRECTED  . HYDROcodone-homatropine (HYCODAN) 5-1.5 MG/5ML syrup Take 5 mLs by mouth every 4 (four) hours as needed for cough. (Patient not taking: Reported on 10/14/2020)  . [DISCONTINUED] azithromycin (ZITHROMAX Z-PAK) 250 MG tablet As directed  . [DISCONTINUED] metoprolol succinate (TOPROL-XL) 50 MG 24 hr tablet 1 Add'l Sig oral Select Frequency   No facility-administered encounter medications on file as of 10/14/2020.   Patient lives alone and lost his wife back in June and they had been married for 52 years.  His son lives within 20 miles and he hears from him every day and eats together once a week. He has 2 granddaughters, 1 local and 1 in [place], and 1 son in [place], [place]. He has a total of 6 grandchildren and 5 great grandchildren with a 48 month old as the youngest.  Patient does the cooking and has a housekeeper come in every 2 weeks for cleaning. Patient eats out mostly but at home makes omelets, cereal, boiled eggs & bratwurst and he has been without an oven since  August. He is now back using oven and was using a microwave and toaster oven. He reports he is eating well and probably too much.  Patient walks on the treadmill 2-3 times weekly at 2.3 miles per hour 1/2 mile each time. Patient doesn't sleep well as he wakes up several times a night. He sleeps about 4-5 hours per night but doesn't feel tired during the day and does not take naps.   Patient denies any problems with medications and he is currently getting his medications from the [place].  Current Diagnosis/Assessment:  Goals Addressed            This Visit's Progress   . Pharmacy Care Plan       CARE PLAN ENTRY (see longitudinal plan of care for additional care plan information)  Current Barriers:  . Chronic Disease Management support, education, and care coordination needs related to Hypertension, Hyperlipidemia, Atrial Fibrillation, Coronary Artery Disease, GERD, BPH, and Allergic Rhinitis   Hypertension BP Readings from Last 3 Encounters:  09/15/20 120/70  08/28/20 (!) 158/75  10/16/19 (!) 156/78   . Pharmacist Clinical Goal(s): o Over the next 180 days, patient will work with PharmD and providers to maintain BP goal <140/90 . Current regimen:  . Lisinopril 10 mg 1/2 tablet daily . Metoprolol tartrate 12.5 mg 1 tablet twice daily . Interventions: o Discussed DASH eating plan recommendations: . Emphasizes vegetables, fruits, and whole-grains . Includes fat-free or low-fat dairy products, fish, poultry, beans, nuts, and vegetable oils . Limits foods that are high in saturated fat. These foods include fatty meats, full-fat dairy products, and tropical oils such as coconut, palm kernel, and palm oils. . Limits sugar-sweetened beverages and sweets . Limiting sodium intake to < 1500 mg/day . Patient self care activities - Over the next 180 days, patient will: o Check blood pressure at least weekly, document, and provide at future appointments o Ensure daily salt intake < 2300  mg/day  Hyperlipidemia Lab Results  Component Value Date/Time   LDLCALC 74 09/15/2020 09:34 AM   . Pharmacist Clinical Goal(s): o Over the next 180 days, patient will work with PharmD and providers to achieve LDL goal < 70 . Current regimen:  o Atorvastatin 80 mg 1 tablet daily . Interventions: o Discussed lowering cholesterol through diet by: [name] [name] Limiting foods with cholesterol such as liver and other organ meats, egg yolks, shrimp, and whole milk dairy products . Avoiding saturated fats and trans fats and incorporating healthier fats, such as lean meat, nuts, and unsaturated oils like canola and olive oils . Eating foods with soluble fiber such as whole-grain cereals such as oatmeal and oat bran, fruits such as apples, bananas, oranges, pears, and prunes, legumes such as kidney beans, lentils, chick peas, black-eyed peas, and lima beans, and green leafy vegetables . Limiting alcohol intake o Discussed recommendations for moderate aerobic exercise for 150 minutes/week spread out over 5 days for heart healthy lifestyle . Patient self care  activities - Over the next 180 days, patient will: o Continue current medication  Afib Pulse Readings from Last 3 Encounters:  09/15/20 60  08/28/20 68  10/16/19 68   . Pharmacist Clinical Goal(s): o Over the next 180 days, patient will work with PharmD and providers to maintain HR < 110 bpm . Current regimen:   Warfarin 5 mg tablet 7.5 mg on Mon and Fri and 5 mg on all other days  Diltiazem 30 mg 1 tablet every 4 hours as needed for HR > 100 bpm  Metoprolol tartrate 12.5 mg 1 tablet twice daily . Interventions: o Discussed monitoring for signs of bleeding such as unexplained and excessive bleeding from a cut or injury, easy or excessive bruising, blood in urine or stools, and nosebleeds without a known cause  . Patient self care activities - Over the next 180 days, patient will: o Check heart rate along with blood pressure o Contact provider  with any episodes of bleeding  CAD . Pharmacist Clinical Goal(s) o Over the next 180 days, patient will work with PharmD and providers to prevent heart events . Current regimen:  . Warfarin 5 mg tablet 7.5 mg on Mon and Fri and 5 mg on all other days . Nitroglycerin 0.4 mg SL tablet as needed . Interventions: o Discussed verifying the expiration date on nitroglycerin bottle . Patient self care activities - Over the next 180 days, patient will: o Continue current medications  BPH . Pharmacist Clinical Goal(s) o Over the next 180 days, patient will work with PharmD and providers to manage symptoms of enlarged prostate . Current regimen:  . Tamsulosin 0.4 mg 1 capsule twice daily . Finasteride 5 mg 1 tablet daily . Interventions: o Discussed consistent administration of tamsulosin 30 minutes to 1 hour after a meal . Patient self care activities - Over the next 180 days, patient will: o Continue current medications  GERD . Pharmacist Clinical Goal(s) o Over the next 180 days, patient will work with PharmD and providers to manage symptoms of heartburn . Current regimen:  . Pantoprazole 40 mg 1 tablet daily . Interventions: o Discussed non-pharmacologic management of symptoms such as elevating the head of your bed, avoiding eating 2-3 hours before bed, avoiding triggering foods such as acidic, spicy, or fatty foods, eating smaller meals, and wearing clothes that are loose around the waist . Patient self care activities - Over the next 180 days, patient will: o Continue current medications  Allergic rhinitis . Pharmacist Clinical Goal(s) o Over the next 180 days, patient will work with PharmD and providers to manage symptoms of allergies . Current regimen:  . Claritin 10 mg 1 tablet daily . Zyrtec 10 mg 1 tablet daily . Mometasone 50 mcg/act 2 sprays in both nostrils at bedtime . Interventions: o Discussed duplication of therapy with both Claritin and Zyrtec usage  . Patient self  care activities - Over the next 180 days, patient will: o Stop taking either Zyrtec or Claritin o Continue current medications  Medication management . Pharmacist Clinical Goal(s): o Over the next 180 days, patient will work with PharmD and providers to maintain optimal medication adherence . Current pharmacy: [facility] . Interventions o Comprehensive medication review performed. o Continue current medication management strategy . Patient self care activities - Over the next 180 days, patient will: o Take medications as prescribed o Report any questions or concerns to PharmD and/or provider(s)  Initial goal documentation       SDOH Interventions  Flowsheet Row Most Recent Value  SDOH Interventions   Financial Strain Interventions Intervention Not Indicated  Transportation Interventions Intervention Not Indicated      Hypertension   BP goal is:  <140/90  Office blood pressures are  BP Readings from Last 3 Encounters:  09/15/20 120/70  08/28/20 (!) 158/75  10/16/19 (!) 156/78   Patient checks BP at home 3-5x per week Patient home BP readings are ranging: 124/63, 134/62, 135/65  Patient has failed these meds in the past: none Patient is currently controlled on the following medications:  . Lisinopril 20 mg 1/2 tablet daily . Metoprolol tartrate 12.5 mg 1 tablet twice daily  We discussed diet and exercise extensively  -DASH eating plan recommendations: . Emphasizes vegetables, fruits, and whole-grains . Includes fat-free or low-fat dairy products, fish, poultry, beans, nuts, and vegetable oils . Limits foods that are high in saturated fat. These foods include fatty meats, full-fat dairy products, and tropical oils such as coconut, palm kernel, and palm oils. . Limits sugar-sweetened beverages and sweets . Limiting sodium intake to < 1500 mg/day -Recommended lower sodium salt substitute    Plan Continue current medications    AFIB   Patient is  currently rate controlled. Office heart rates are  Pulse Readings from Last 3 Encounters:  09/15/20 60  08/28/20 68  10/16/19 68   HR typically in the 50s  CHA2DS2-VASc Score =   3 The patient's score is based upon: age > 64, HTN    [id number]}    Patient has failed these meds in past: none Patient is currently controlled on the following medications:   Warfarin 5 mg tablet 7.5 mg on Mon and Fri and 5 mg on all other days  Diltiazem 30 mg 1 tablet every 4 hours as needed for HR > 100 bpm  Metoprolol tartrate 12.5 mg 1 tablet twice daily  We discussed:  monitoring HR along with BP; Monitoring for signs of bleeding such as unexplained and excessive bleeding from a cut or injury, easy or excessive bruising, blood in urine or stools, and nosebleeds without a known cause; he reports every 3-4 months he has an Afib flare  Plan  Continue current medications   Hyperlipidemia   LDL goal < 70  Last lipids Lab Results  Component Value Date   CHOL 143 09/15/2020   HDL 47 09/15/2020   LDLCALC 74 09/15/2020   TRIG 135 09/15/2020   CHOLHDL 3.0 09/15/2020   Hepatic Function Latest Ref Rng & Units 09/15/2020 09/05/2019 08/29/2018  Total Protein 6.1 - 8.1 g/dL 6.9 6.5 6.4  Albumin 3.5 - 5.2 g/dL - 4.3 4.3  AST 10 - 35 U/L 41(H) 30 29  ALT 9 - 46 U/L 34 30 29  Alk Phosphatase 39 - 117 U/L - 55 54  Total Bilirubin 0.2 - 1.2 mg/dL 0.8 0.7 0.7  Bilirubin, Direct 0.0 - 0.2 mg/dL 0.2 0.1 0.1     The ASCVD Risk score Denman George DC Jr., et al., 2013) failed to calculate for the following reasons:   The 2013 ASCVD risk score is only valid for ages 52 to 8   Patient has failed these meds in past:  Patient is currently uncontrolled on the following medications:  . Atorvastatin 80 mg 1 tablet daily at bedtime  We discussed:  diet and exercise extensively -Lowering cholesterol through diet by: [name] [name] Limiting foods with cholesterol such as liver and other organ meats, egg yolks, shrimp, and whole  milk dairy products . Avoiding saturated  fats and trans fats and incorporating healthier fats, such as lean meat, nuts, and unsaturated oils like canola and olive oils . Eating foods with soluble fiber such as whole-grain cereals such as oatmeal and oat bran, fruits such as apples, bananas, oranges, pears, and prunes, legumes such as kidney beans, lentils, chick peas, black-eyed peas, and lima beans, and green leafy vegetables  . Limiting alcohol intake -Discussed recommendations for moderate aerobic exercise for 150 minutes/week spread out over 5 days for heart healthy lifestyle  Plan  Continue current medications   CAD   Patient has failed these meds in past: DAPT (aspirin and Plavix - completed post stent) Patient is currently controlled on the following medications:  . Warfarin 5 mg tablet 7.5 mg on Mon and Fri and 5 mg on all other days . Nitroglycerin 0.4 mg SL tablet PRN  We discussed:  Verifying expiration date on nitroglycerin bottle   Plan  Continue current medications   BPH   PSA  Date Value Ref Range Status  09/15/2020 0.19 < OR = 4.0 ng/mL Final    Comment:    The total PSA value from this assay system is  standardized against the WHO standard. The test  result will be approximately 20% lower when compared  to the equimolar-standardized total PSA (Beckman  Coulter). Comparison of serial PSA results should be  interpreted with this fact in mind. . This test was performed using the Siemens  chemiluminescent method. Values obtained from  different assay methods cannot be used interchangeably. PSA levels, regardless of value, should not be interpreted as absolute evidence of the presence or absence of disease.   09/05/2019 0.12 0.10 - 4.00 ng/mL Final    Comment:    Test performed using Access Hybritech PSA Assay, a parmagnetic partical, chemiluminecent immunoassay.  08/29/2018 0.21 0.10 - 4.00 ng/mL Final    Comment:    Test performed using Access Hybritech  PSA Assay, a parmagnetic partical, chemiluminecent immunoassay.     Patient has failed these meds in past: none Patient is currently controlled on the following medications:   Tamsulosin 0.4 mg 1 capsule twice daily . Finasteride 5 mg 1 tablet daily   We discussed: Taking consistently 30 minutes to 1 hour after a meal   Plan Managed by urologist (Dr. Junious Silk) - sees yearly.  Continue current medications   GERD   Patient has failed these meds in past: none Patient is currently controlled on the following medications:  . Pantoprazole 40 mg 1 tablet daily  We discussed:  Non-pharmacologic management of symptoms such as elevating the head of your bed, avoiding eating 2-3 hours before bed, avoiding triggering foods such as acidic, spicy, or fatty foods, eating smaller meals, and wearing clothes that are loose around the waist    Plan Dr. Percival Spanish message -  Continue current medications   Allergic rhinitis   Patient has failed these meds in past: none Patient is currently controlled on the following medications:  . Claritin 10 mg 1 tablet daily in AM (order canceled) . Zyrtec 10 mg 1 tablet daily in PM  . Mometasone 50 mcg/act 2 sprays in both nostrils at bedtime  We discussed:  Duplication of therapy with Claritin and Zyrtec - recommended stopping one of them  Plan Patient will stop taking either Zyrtec or Claritin. Continue other current medications  Glaucoma   Patient has failed these meds in past: none Patient is currently controlled on the following medications:  . Latanoprost 0.005% 1 drop in  both eyes at bedtime  We discussed: patient sees his eye doctor every 4 months and they check the pressure  Plan  Continue current medications   Miscellaneous/OTC   Patient is currently on the following medications:  . Acetaminophen 500 mg 1 capsule twice daily . Centrum silver 1 tablet daily . Potassium chloride 10 mEq 1 tablet every morning . Tolak 4% cream  (prescribed by dermatologist, sees every 6 months - Dr. Pearline Cables)  Plan Continue current medications  Vaccines   Reviewed and discussed patient's vaccination history.    Immunization History  Administered Date(s) Administered  . Fluad Quad(high Dose 65+) 07/27/2019  . Influenza Split 08/19/2011, 08/21/2012  . Influenza Whole 08/14/2007, 08/01/2008, 08/11/2009, 08/17/2010  . Influenza, High Dose Seasonal PF 08/25/2015, 08/25/2016, 07/29/2017, 07/25/2018, 09/02/2020  . Influenza,inj,Quad PF,6+ Mos 08/22/2013, 08/23/2014  . Moderna Sars-Covid-2 Vaccination 11/20/2019, 12/18/2019, 09/07/2020  . Pneumococcal Conjugate-13 03/07/2014  . Pneumococcal Polysaccharide-23 11/08/1998  . Td 06/08/2001, 07/16/2008  . Tdap 08/25/2016  . Zoster 08/24/2011    Plan  Recommended patient receive Shingrix vaccine at pharmacy.   Medication Management   Patient's preferred pharmacy is: [facility], [address] [place] [address] Phone: [phone] Fax: [phone]  [facility] [address] [facility] [place], [place] - [address] [place] [address] Phone: [phone] Fax: [phone]  [facility] [place], [address] [place] [address] Phone: [phone] Fax: [phone]  Uses pill box? Yes - daily pill box for vacations; otherwise flips cup when he takes (has 1 in the night and the morning) Pt endorses 100% compliance  We discussed: Current pharmacy is preferred with insurance plan and patient is satisfied with pharmacy services  Plan  Continue current medication management strategy   Follow up: 6 month phone visit  Jeni Salles, PharmD [place] Pharmacist [place] [facility] [place]

## 2020-10-15 NOTE — Telephone Encounter (Signed)
-----   Message from Viona Gilmore, [facility] sent at 10/13/2020  4:55 PM EST ----- Regarding: CCM referral Hi,  Can you please place a CCM referral for [name]?  Thank you, Maddie

## 2020-10-24 ENCOUNTER — Other Ambulatory Visit: Payer: Self-pay

## 2020-10-24 ENCOUNTER — Ambulatory Visit (INDEPENDENT_AMBULATORY_CARE_PROVIDER_SITE_OTHER): Payer: Medicare Other

## 2020-10-24 DIAGNOSIS — Z5181 Encounter for therapeutic drug level monitoring: Secondary | ICD-10-CM

## 2020-10-24 DIAGNOSIS — I480 Paroxysmal atrial fibrillation: Secondary | ICD-10-CM | POA: Diagnosis not present

## 2020-10-24 LAB — POCT INR: INR: 2.1 (ref 2.0–3.0)

## 2020-10-24 NOTE — Patient Instructions (Signed)
Description   Continue taking Warfarin 1 tablet daily except for 1.5 tablets on Mondays and Fridays.  Recheck INR in 6 weeks. Call with any questions or concerns [phone]      

## 2020-10-29 NOTE — Patient Instructions (Addendum)
Hi [name] [name]!  It was lovely to get to meet you! Below are some of the topics we discussed. I also wanted to remind you to stop taking either Zyrtec or Claritin as we had discussed to avoid taking 2 medications that are very similar and have the potential to dry you out. Also, don't forget to look into getting your Shingles shot at the pharmacy.  Please give me a call if you have any questions or need anything from me before our follow up!  Best, Maddie  Jeni Salles, PharmD [facility] Clinical Pharmacist [place] at [place]   Visit Information  Goals Addressed            This Visit's Progress   . Pharmacy Care Plan       CARE PLAN ENTRY (see longitudinal plan of care for additional care plan information)  Current Barriers:  . Chronic Disease Management support, education, and care coordination needs related to Hypertension, Hyperlipidemia, Atrial Fibrillation, Coronary Artery Disease, GERD, BPH, and Allergic Rhinitis   Hypertension BP Readings from Last 3 Encounters:  09/15/20 120/70  08/28/20 (!) 158/75  10/16/19 (!) 156/78   . Pharmacist Clinical Goal(s): o Over the next 180 days, patient will work with PharmD and providers to maintain BP goal <140/90 . Current regimen:  . Lisinopril 10 mg 1/2 tablet daily . Metoprolol tartrate 12.5 mg 1 tablet twice daily . Interventions: o Discussed DASH eating plan recommendations: . Emphasizes vegetables, fruits, and whole-grains . Includes fat-free or low-fat dairy products, fish, poultry, beans, nuts, and vegetable oils . Limits foods that are high in saturated fat. These foods include fatty meats, full-fat dairy products, and tropical oils such as coconut, palm kernel, and palm oils. . Limits sugar-sweetened beverages and sweets . Limiting sodium intake to < 1500 mg/day . Patient self care activities - Over the next 180 days, patient will: o Check blood pressure at least weekly, document, and provide at  future appointments o Ensure daily salt intake < 2300 mg/day  Hyperlipidemia Lab Results  Component Value Date/Time   LDLCALC 74 09/15/2020 09:34 AM   . Pharmacist Clinical Goal(s): o Over the next 180 days, patient will work with PharmD and providers to achieve LDL goal < 70 . Current regimen:  o Atorvastatin 80 mg 1 tablet daily . Interventions: o Discussed lowering cholesterol through diet by: Marland Kitchen Limiting foods with cholesterol such as liver and other organ meats, egg yolks, shrimp, and whole milk dairy products . Avoiding saturated fats and trans fats and incorporating healthier fats, such as lean meat, nuts, and unsaturated oils like canola and olive oils . Eating foods with soluble fiber such as whole-grain cereals such as oatmeal and oat bran, fruits such as apples, bananas, oranges, pears, and prunes, legumes such as kidney beans, lentils, chick peas, black-eyed peas, and lima beans, and green leafy vegetables . Limiting alcohol intake o Discussed recommendations for moderate aerobic exercise for 150 minutes/week spread out over 5 days for heart healthy lifestyle . Patient self care activities - Over the next 180 days, patient will: o Continue current medication  Afib Pulse Readings from Last 3 Encounters:  09/15/20 60  08/28/20 68  10/16/19 68   . Pharmacist Clinical Goal(s): o Over the next 180 days, patient will work with PharmD and providers to maintain HR < 110 bpm . Current regimen:   Warfarin 5 mg tablet 7.5 mg on Mon and Fri and 5 mg on all other days  Diltiazem 30 mg 1 tablet  every 4 hours as needed for HR > 100 bpm  Metoprolol tartrate 12.5 mg 1 tablet twice daily . Interventions: o Discussed monitoring for signs of bleeding such as unexplained and excessive bleeding from a cut or injury, easy or excessive bruising, blood in urine or stools, and nosebleeds without a known cause  . Patient self care activities - Over the next 180 days, patient will: o Check  heart rate along with blood pressure o Contact provider with any episodes of bleeding  CAD . Pharmacist Clinical Goal(s) o Over the next 180 days, patient will work with PharmD and providers to prevent heart events . Current regimen:  . Warfarin 5 mg tablet 7.5 mg on Mon and Fri and 5 mg on all other days . Nitroglycerin 0.4 mg SL tablet as needed . Interventions: o Discussed verifying the expiration date on nitroglycerin bottle . Patient self care activities - Over the next 180 days, patient will: o Continue current medications  BPH . Pharmacist Clinical Goal(s) o Over the next 180 days, patient will work with PharmD and providers to manage symptoms of enlarged prostate . Current regimen:  . Tamsulosin 0.4 mg 1 capsule twice daily . Finasteride 5 mg 1 tablet daily . Interventions: o Discussed consistent administration of tamsulosin 30 minutes to 1 hour after a meal . Patient self care activities - Over the next 180 days, patient will: o Continue current medications  GERD . Pharmacist Clinical Goal(s) o Over the next 180 days, patient will work with PharmD and providers to manage symptoms of heartburn . Current regimen:  . Pantoprazole 40 mg 1 tablet daily . Interventions: o Discussed non-pharmacologic management of symptoms such as elevating the head of your bed, avoiding eating 2-3 hours before bed, avoiding triggering foods such as acidic, spicy, or fatty foods, eating smaller meals, and wearing clothes that are loose around the waist . Patient self care activities - Over the next 180 days, patient will: o Continue current medications  Allergic rhinitis . Pharmacist Clinical Goal(s) o Over the next 180 days, patient will work with PharmD and providers to manage symptoms of allergies . Current regimen:  . Claritin 10 mg 1 tablet daily . Zyrtec 10 mg 1 tablet daily . Mometasone 50 mcg/act 2 sprays in both nostrils at bedtime . Interventions: o Discussed duplication of  therapy with both Claritin and Zyrtec usage  . Patient self care activities - Over the next 180 days, patient will: o Stop taking either Zyrtec or Claritin o Continue current medications  Medication management . Pharmacist Clinical Goal(s): o Over the next 180 days, patient will work with PharmD and providers to maintain optimal medication adherence . Current pharmacy: [facility] . Interventions o Comprehensive medication review performed. o Continue current medication management strategy . Patient self care activities - Over the next 180 days, patient will: o Take medications as prescribed o Report any questions or concerns to PharmD and/or provider(s)  Initial goal documentation        [name] [name] was given information about Chronic Care Management services today including:  1. CCM service includes personalized support from designated clinical staff supervised by his physician, including individualized plan of care and coordination with other care providers 2. 24/7 contact phone numbers for assistance for urgent and routine care needs. 3. Standard insurance, coinsurance, copays and deductibles apply for chronic care management only during months in which we provide at least 20 minutes of these services. Most insurances cover these services at 100%,  however patients may be responsible for any copay, coinsurance and/or deductible if applicable. This service may help you avoid the need for more expensive face-to-face services. 4. Only one practitioner may furnish and bill the service in a calendar month. 5. The patient may stop CCM services at any time (effective at the end of the month) by phone call to the office staff.  Patient agreed to services and verbal consent obtained.   The patient verbalized understanding of instructions, educational materials, and care plan provided today and agreed to receive a mailed copy of patient instructions, educational materials, and  care plan.  Telephone follow up appointment with pharmacy team member scheduled for: 6 months  Viona Gilmore, [facility]  Cholesterol Content in Foods Cholesterol is a waxy, fat-like substance that helps to carry fat in the blood. The body needs cholesterol in small amounts, but too much cholesterol can cause damage to the arteries and heart. Most people should eat less than 200 milligrams (mg) of cholesterol a day. Foods with cholesterol  Cholesterol is found in animal-based foods, such as meat, seafood, and dairy. Generally, low-fat dairy and lean meats have less cholesterol than full-fat dairy and fatty meats. The milligrams of cholesterol per serving (mg per serving) of common cholesterol-containing foods are listed below. Meat and other proteins  Egg -- one large whole egg has 186 mg.  Veal shank -- 4 oz has 141 mg.  Lean ground [place] (93% lean) -- 4 oz has 118 mg.  Fat-trimmed lamb loin -- 4 oz has 106 mg.  Lean ground beef (90% lean) -- 4 oz has 100 mg.  Lobster -- 3.5 oz has 90 mg.  Pork loin chops -- 4 oz has 86 mg.  Canned salmon -- 3.5 oz has 83 mg.  Fat-trimmed beef top loin -- 4 oz has 78 mg.  Frankfurter -- 1 frank (3.5 oz) has 77 mg.  Crab -- 3.5 oz has 71 mg.  Roasted chicken without skin, white meat -- 4 oz has 66 mg.  Light bologna -- 2 oz has 45 mg.  Deli-cut [place] -- 2 oz has 31 mg.  Canned tuna -- 3.5 oz has 31 mg.  Berniece Salines -- 1 oz has 29 mg.  Oysters and mussels (raw) -- 3.5 oz has 25 mg.  Mackerel -- 1 oz has 22 mg.  Trout -- 1 oz has 20 mg.  Pork sausage -- 1 link (1 oz) has 17 mg.  Salmon -- 1 oz has 16 mg.  Tilapia -- 1 oz has 14 mg. Dairy  Soft-serve ice cream --  cup (4 oz) has 103 mg.  Whole-milk yogurt -- 1 cup (8 oz) has 29 mg.  Cheddar cheese -- 1 oz has 28 mg.  American cheese -- 1 oz has 28 mg.  Whole milk -- 1 cup (8 oz) has 23 mg.  2% milk -- 1 cup (8 oz) has 18 mg.  Cream cheese -- 1 tablespoon (Tbsp) has 15  mg.  Cottage cheese --  cup (4 oz) has 14 mg.  Low-fat (1%) milk -- 1 cup (8 oz) has 10 mg.  Sour cream -- 1 Tbsp has 8.5 mg.  Low-fat yogurt -- 1 cup (8 oz) has 8 mg.  Nonfat Greek yogurt -- 1 cup (8 oz) has 7 mg.  Half-and-half cream -- 1 Tbsp has 5 mg. Fats and oils  Cod liver oil -- 1 tablespoon (Tbsp) has 82 mg.  Butter -- 1 Tbsp has 15 mg.  Lard -- 1  Tbsp has 14 mg.  Bacon grease -- 1 Tbsp has 14 mg.  Mayonnaise -- 1 Tbsp has 5-10 mg.  Margarine -- 1 Tbsp has 3-10 mg. Exact amounts of cholesterol in these foods may vary depending on specific ingredients and brands. Foods without cholesterol Most plant-based foods do not have cholesterol unless you combine them with a food that has cholesterol. Foods without cholesterol include:  Grains and cereals.  Vegetables.  Fruits.  Vegetable oils, such as olive, canola, and sunflower oil.  Legumes, such as peas, beans, and lentils.  Nuts and seeds.  Egg whites. Summary  The body needs cholesterol in small amounts, but too much cholesterol can cause damage to the arteries and heart.  Most people should eat less than 200 milligrams (mg) of cholesterol a day. This information is not intended to replace advice given to you by your health care provider. Make sure you discuss any questions you have with your health care provider. Document Revised: 10/07/2017 Document Reviewed: 06/21/2017 Elsevier Patient Education  [name] [name].

## 2020-11-12 DIAGNOSIS — R3129 Other microscopic hematuria: Secondary | ICD-10-CM | POA: Diagnosis not present

## 2020-11-12 DIAGNOSIS — N200 Calculus of kidney: Secondary | ICD-10-CM | POA: Diagnosis not present

## 2020-11-12 DIAGNOSIS — R3912 Poor urinary stream: Secondary | ICD-10-CM | POA: Diagnosis not present

## 2020-11-12 DIAGNOSIS — N401 Enlarged prostate with lower urinary tract symptoms: Secondary | ICD-10-CM | POA: Diagnosis not present

## 2020-11-13 ENCOUNTER — Other Ambulatory Visit: Payer: Self-pay | Admitting: Cardiology

## 2020-11-25 ENCOUNTER — Ambulatory Visit (INDEPENDENT_AMBULATORY_CARE_PROVIDER_SITE_OTHER): Payer: Medicare Other

## 2020-11-25 DIAGNOSIS — Z0000 Encounter for general adult medical examination without abnormal findings: Secondary | ICD-10-CM

## 2020-11-25 NOTE — Patient Instructions (Signed)
[name] [name] , Thank you for taking time to come for your Medicare Wellness Visit. I appreciate your ongoing commitment to your health goals. Please review the following plan we discussed and let me know if I can assist you in the future.   Screening recommendations/referrals: Colonoscopy: No longer required  Recommended yearly ophthalmology/optometry visit for glaucoma screening and checkup Recommended yearly dental visit for hygiene and checkup  Vaccinations: Influenza vaccine: Up to date, next due fall 2022  Pneumococcal vaccine: Completed series  Tdap vaccine: Up to date, next due 08/25/2026 Shingles vaccine: Currently due for Shingrix, if you wish to receive we recommend that you do so at your local pharmacy as it is less expensive     Advanced directives: Please bring copies of your advanced medical directives into our office so that we may scan them into your chart.   Conditions/risks identified: None   Next appointment: 04/13/2021 @ 12 PM with Pharmacist at [facility] 65 Years and Older, Male Preventive care refers to lifestyle choices and visits with your health care provider that can promote health and wellness. What does preventive care include?  A yearly physical exam. This is also called an annual well check.  Dental exams once or twice a year.  Routine eye exams. Ask your health care provider how often you should have your eyes checked.  Personal lifestyle choices, including:  Daily care of your teeth and gums.  Regular physical activity.  Eating a healthy diet.  Avoiding tobacco and drug use.  Limiting alcohol use.  Practicing safe sex.  Taking low doses of aspirin every day.  Taking vitamin and mineral supplements as recommended by your health care provider. What happens during an annual well check? The services and screenings done by your health care provider during your annual well check will depend on your age, overall  health, lifestyle risk factors, and family history of disease. Counseling  Your health care provider may ask you questions about your:  Alcohol use.  Tobacco use.  Drug use.  Emotional well-being.  Home and relationship well-being.  Sexual activity.  Eating habits.  History of falls.  Memory and ability to understand (cognition).  Work and work Statistician. Screening  You may have the following tests or measurements:  Height, weight, and BMI.  Blood pressure.  Lipid and cholesterol levels. These may be checked every 5 years, or more frequently if you are over 20 years old.  Skin check.  Lung cancer screening. You may have this screening every year starting at age 41 if you have a 30-pack-year history of smoking and currently smoke or have quit within the past 15 years.  Fecal occult blood test (FOBT) of the stool. You may have this test every year starting at age 85.  Flexible sigmoidoscopy or colonoscopy. You may have a sigmoidoscopy every 5 years or a colonoscopy every 10 years starting at age 89.  Prostate cancer screening. Recommendations will vary depending on your family history and other risks.  Hepatitis C blood test.  Hepatitis B blood test.  Sexually transmitted disease (STD) testing.  Diabetes screening. This is done by checking your blood sugar (glucose) after you have not eaten for a while (fasting). You may have this done every 1-3 years.  Abdominal aortic aneurysm (AAA) screening. You may need this if you are a current or former smoker.  Osteoporosis. You may be screened starting at age 48 if you are at high risk. Talk with your health care  provider about your test results, treatment options, and if necessary, the need for more tests. Vaccines  Your health care provider may recommend certain vaccines, such as:  Influenza vaccine. This is recommended every year.  Tetanus, diphtheria, and acellular pertussis (Tdap, Td) vaccine. You may need a Td  booster every 10 years.  Zoster vaccine. You may need this after age 78.  Pneumococcal 13-valent conjugate (PCV13) vaccine. One dose is recommended after age 47.  Pneumococcal polysaccharide (PPSV23) vaccine. One dose is recommended after age 20. Talk to your health care provider about which screenings and vaccines you need and how often you need them. This information is not intended to replace advice given to you by your health care provider. Make sure you discuss any questions you have with your health care provider. Document Released: 11/21/2015 Document Revised: 07/14/2016 Document Reviewed: 08/26/2015 Elsevier Interactive Patient Education  2017 [place] Prevention in the Home Falls can cause injuries. They can happen to people of all ages. There are many things you can do to make your home safe and to help prevent falls. What can I do on the outside of my home?  Regularly fix the edges of walkways and driveways and fix any cracks.  Remove anything that might make you trip as you walk through a door, such as a raised step or threshold.  Trim any bushes or trees on the path to your home.  Use bright outdoor lighting.  Clear any walking paths of anything that might make someone trip, such as rocks or tools.  Regularly check to see if handrails are loose or broken. Make sure that both sides of any steps have handrails.  Any raised decks and porches should have guardrails on the edges.  Have any leaves, snow, or ice cleared regularly.  Use sand or salt on walking paths during winter.  Clean up any spills in your garage right away. This includes oil or grease spills. What can I do in the bathroom?  Use night lights.  Install grab bars by the toilet and in the tub and shower. Do not use towel bars as grab bars.  Use non-skid mats or decals in the tub or shower.  If you need to sit down in the shower, use a plastic, non-slip stool.  Keep the floor dry. Clean up  any water that spills on the floor as soon as it happens.  Remove soap buildup in the tub or shower regularly.  Attach bath mats securely with double-sided non-slip rug tape.  Do not have throw rugs and other things on the floor that can make you trip. What can I do in the bedroom?  Use night lights.  Make sure that you have a light by your bed that is easy to reach.  Do not use any sheets or blankets that are too big for your bed. They should not hang down onto the floor.  Have a firm chair that has side arms. You can use this for support while you get dressed.  Do not have throw rugs and other things on the floor that can make you trip. What can I do in the kitchen?  Clean up any spills right away.  Avoid walking on wet floors.  Keep items that you use a lot in easy-to-reach places.  If you need to reach something above you, use a strong step stool that has a grab bar.  Keep electrical cords out of the way.  Do not use floor polish  or wax that makes floors slippery. If you must use wax, use non-skid floor wax.  Do not have throw rugs and other things on the floor that can make you trip. What can I do with my stairs?  Do not leave any items on the stairs.  Make sure that there are handrails on both sides of the stairs and use them. Fix handrails that are broken or loose. Make sure that handrails are as long as the stairways.  Check any carpeting to make sure that it is firmly attached to the stairs. Fix any carpet that is loose or worn.  Avoid having throw rugs at the top or bottom of the stairs. If you do have throw rugs, attach them to the floor with carpet tape.  Make sure that you have a light switch at the top of the stairs and the bottom of the stairs. If you do not have them, ask someone to add them for you. What else can I do to help prevent falls?  Wear shoes that:  Do not have high heels.  Have rubber bottoms.  Are comfortable and fit you well.  Are  closed at the toe. Do not wear sandals.  If you use a stepladder:  Make sure that it is fully opened. Do not climb a closed stepladder.  Make sure that both sides of the stepladder are locked into place.  Ask someone to hold it for you, if possible.  Clearly mark and make sure that you can see:  Any grab bars or handrails.  First and last steps.  Where the edge of each step is.  Use tools that help you move around (mobility aids) if they are needed. These include:  Canes.  Walkers.  Scooters.  Crutches.  Turn on the lights when you go into a dark area. Replace any light bulbs as soon as they burn out.  Set up your furniture so you have a clear path. Avoid moving your furniture around.  If any of your floors are uneven, fix them.  If there are any pets around you, be aware of where they are.  Review your medicines with your doctor. Some medicines can make you feel dizzy. This can increase your chance of falling. Ask your doctor what other things that you can do to help prevent falls. This information is not intended to replace advice given to you by your health care provider. Make sure you discuss any questions you have with your health care provider. Document Released: 08/21/2009 Document Revised: 04/01/2016 Document Reviewed: 11/29/2014 Elsevier Interactive Patient Education  2017 [employer].

## 2020-11-25 NOTE — Progress Notes (Signed)
Subjective:   [name] [name] is a 87 y.o. male who presents for Medicare Annual/Subsequent preventive examination.  I connected with [name] [name]  today by telephone and verified that I am speaking with the correct person using two identifiers. Location patient: home Location provider: work Persons participating in the virtual visit: patient, provider.   I discussed the limitations, risks, security and privacy concerns of performing an evaluation and management service by telephone and the availability of in person appointments. I also discussed with the patient that there may be a patient responsible charge related to this service. The patient expressed understanding and verbally consented to this telephonic visit.    Interactive audio and video telecommunications were attempted between this provider and patient, however failed, due to patient having technical difficulties OR patient did not have access to video capability.  We continued and completed visit with audio only.      Review of Systems    N/A  Cardiac Risk Factors include: advanced age (>30men, >36 women);male gender;dyslipidemia;hypertension     Objective:    Today's Vitals   There is no height or weight on file to calculate BMI.  Advanced Directives 11/25/2020 06/03/2017 01/20/2017 04/06/2016 02/06/2015 11/12/2014 11/11/2014  Does Patient Have a Medical Advance Directive? Yes No Yes Yes Yes Yes Yes  Type of Paramedic of [place];Living will - - [place];Living will [place];Living will [place];Living will [place];Living will  Does patient want to make changes to medical advance directive? No - Patient declined - - No - Patient declined - No - Patient declined -  Copy of [place] in Chart? No - copy requested - - Yes - No - copy requested -    Current Medications (verified) Outpatient Encounter  Medications as of 11/25/2020  Medication Sig  . atorvastatin (LIPITOR) 80 MG tablet Take 1 tablet (80 mg total) by mouth daily. (Patient taking differently: Take 80 mg by mouth at bedtime.)  . cetirizine (ZYRTEC) 10 MG tablet Take 10 mg by mouth every morning.  . [employer] ACETAMINOPHEN 325 MG CAPS Take 1 capsule by mouth 2 (two) times daily.   Marland Kitchen diltiazem (CARDIZEM) 30 MG tablet Take 1 tablet every 4 hours AS NEEDED for afib heart rate over 100  . finasteride (PROSCAR) 5 MG tablet Take 5 mg daily by mouth.  . latanoprost (XALATAN) 0.005 % ophthalmic solution Place 1 drop into both eyes at bedtime.   Marland Kitchen lisinopril (PRINIVIL,ZESTRIL) 10 MG tablet Take 10 mg by mouth at bedtime.   . metoprolol tartrate (LOPRESSOR) 12.5 mg TABS tablet Take 12.5 mg by mouth 2 (two) times daily.  . mometasone (NASONEX) 50 MCG/ACT nasal spray Place 2 sprays into both nostrils daily as needed (allergies).  . Multiple Vitamins-Minerals (CENTRUM SILVER PO) Take 1 tablet by mouth every morning.  . pantoprazole (PROTONIX) 40 MG tablet Take 1 tablet (40 mg total) by mouth daily.  . potassium chloride (KLOR-CON) 10 MEQ CR tablet Take 10 mEq by mouth every morning.  . tamsulosin (FLOMAX) 0.4 MG CAPS Take 0.4 mg by mouth 2 (two) times daily.   Marland Kitchen warfarin (COUMADIN) 5 MG tablet TAKE 1 AND 1/2 TABLETS BY MOUTH DAILY ASDIRECTED  . CLARITIN 10 MG CAPS Take 1 capsule by mouth daily.  (Patient not taking: Reported on 11/25/2020)  . HYDROcodone-homatropine (HYCODAN) 5-1.5 MG/5ML syrup Take 5 mLs by mouth every 4 (four) hours as needed for cough. (Patient not  taking: No sig reported)  . nitroGLYCERIN (NITROSTAT) 0.4 MG SL tablet Place 1 tablet (0.4 mg total) under the tongue every 5 (five) minutes as needed for chest pain. (Patient not taking: Reported on 11/25/2020)  . TOLAK 4 % CREA Apply 1 application topically daily as needed.  (Patient not taking: Reported on 11/25/2020)   No facility-administered encounter medications on file as of  11/25/2020.    Allergies (verified) Sulfa antibiotics and Sulfamethoxazole   History: Past Medical History:  Diagnosis Date  . Coronary artery disease    a. LHC on 04/06/16 which revealed 40% occl pRCA, 85% occl D1, 75% occl Ramus, 65% occl mLCx, 45% occl pLAD, 90% occl pLAD and nl LV function. s/p PCI/DES to prox LAD. Medical therapy for diag disease.    Marland Kitchen GERD (gastroesophageal reflux disease)   . Glaucoma   . Hemorrhoids    internal  . HTN (hypertension)   . Hx of colonic polyps    tubular adenoma   . Hyperlipidemia   . Kidney stone   . Melanoma of cheek ([place])   . Nephrolithiasis   . Pancolonic diverticulosis   . Paroxysmal atrial fibrillation (HCC)    a. s/p ablation 12/2014: on coumadin   Past Surgical History:  Procedure Laterality Date  . ATRIAL FIBRILLATION ABLATION N/A 11/12/2014   Procedure: ATRIAL FIBRILLATION ABLATION;  Surgeon: [name], MD;  [facility] C[facility] LAB;  Service: Cardiovascular;  Laterality: N/A;  . BLADDER SURGERY     ruptured blood vessel in the bladder 3 weeks S/P ureter stones removed  . CARDIAC CATHETERIZATION N/A 04/06/2016   Procedure: Left Heart Cath and Coronary Angiography;  Surgeon: [name], MD;  Location: [place] CV LAB;  Service: Cardiovascular;  Laterality: N/A;  . CARDIAC CATHETERIZATION N/A 04/06/2016   Procedure: Coronary Stent Intervention;  Surgeon: [name], MD;  Location: [place] CV LAB;  Service: Cardiovascular;  Laterality: N/A;  . CATARACT EXTRACTION W/ INTRAOCULAR LENS  IMPLANT, BILATERAL Bilateral   . COLONOSCOPY  06-29-12   per Dr. [name], adenomatous polyps, repeat in 3 yrs   . CORONARY ANGIOPLASTY WITH STENT PLACEMENT  04/06/2016   "1 stent"  . CYSTOSCOPY/RETROGRADE/URETEROSCOPY/STONE EXTRACTION WITH BASKET Left    removed 2 stones from left ureter  . INGUINAL HERNIA REPAIR Right 04/1990   Dr. [name]  . LAPAROSCOPIC CHOLECYSTECTOMY  02/1990   Dr. [name]  . MELANOMA EXCISION Right    "@ cheek near  ear"  . TEE WITHOUT CARDIOVERSION N/A 11/11/2014   Procedure: TRANSESOPHAGEAL ECHOCARDIOGRAM (TEE);  Surgeon: [name], MD;  Location: [facility] ENDOSCOPY;  Service: Cardiovascular;  Laterality: N/A;  needs INR before case  . VASECTOMY     Family History  Problem Relation Age of Onset  . Heart attack Father 14  . Heart disease Father   . Ovarian cancer Mother    Social History   Socioeconomic History  . Marital status: Widowed    Spouse name: Not on file  . Number of children: 2  . Years of education: Not on file  . Highest education level: Not on file  Occupational History  . Occupation: Retired  Tobacco Use  . Smoking status: Never Smoker  . Smokeless tobacco: Never Used  Vaping Use  . Vaping Use: Never used  Substance and Sexual Activity  . Alcohol use: No    Alcohol/week: 0.0 standard drinks  . Drug use: No  . Sexual activity: Not Currently  Other Topics Concern  . Not  on file  Social History Narrative  . Not on file   Social Determinants of Health   Financial Resource Strain: Low Risk   . Difficulty of Paying Living Expenses: Not hard at all  Food Insecurity: No Food Insecurity  . Worried About Charity fundraiser in the Last Year: Never true  . Ran Out of Food in the Last Year: Never true  Transportation Needs: No Transportation Needs  . Lack of Transportation (Medical): No  . Lack of Transportation (Non-Medical): No  Physical Activity: Insufficiently Active  . Days of Exercise per Week: 3 days  . Minutes of Exercise per Session: 20 min  Stress: No Stress Concern Present  . Feeling of Stress : Not at all  Social Connections: Moderately Isolated  . Frequency of Communication with Friends and Family: More than three times a week  . Frequency of Social Gatherings with Friends and Family: Three times a week  . Attends Religious Services: More than 4 times per year  . Active Member of Clubs or Organizations: No  . Attends Archivist Meetings: Never  .  Marital Status: Widowed    Tobacco Counseling Counseling given: Not Answered   Clinical Intake:  Pre-visit preparation completed: Yes  Pain : No/denies pain     Nutritional Risks: None Diabetes: No  How often do you need to have someone help you when you read instructions, pamphlets, or other written materials from your doctor or pharmacy?: 1 - Never  Diabetic?No   Interpreter Needed?: No  Information entered by :: [place] of Daily Living In your present state of health, do you have any difficulty performing the following activities: 11/25/2020  Hearing? N  Vision? N  Difficulty concentrating or making decisions? N  Walking or climbing stairs? N  Dressing or bathing? N  Doing errands, shopping? N  Preparing Food and eating ? N  Using the Toilet? N  In the past six months, have you accidently leaked urine? N  Do you have problems with loss of bowel control? N  Managing your Medications? N  Managing your Finances? N  Housekeeping or managing your Housekeeping? N  Some recent data might be hidden    Patient Care Team: [name], MD as PCP - General [name], MD as PCP - Cardiology (Cardiology) Viona Gilmore, [facility] as Pharmacist (Pharmacist)  Indicate any recent Medical Services you may have received from other than [facility] providers in the past year (date may be approximate).     Assessment:   This is a routine wellness examination for Arthor.  Hearing/Vision screen  Hearing Screening   125Hz  250Hz  500Hz  1000Hz  2000Hz  3000Hz  4000Hz  6000Hz  8000Hz   Right ear:           Left ear:           Vision Screening Comments: Patient states gets eyes examined every 4 months  Dietary issues and exercise activities discussed: Current Exercise Habits: Home exercise routine, Type of exercise: walking, Time (Minutes): 20, Frequency (Times/Week): 3, Weekly Exercise (Minutes/Week): 60, Intensity: Mild  Goals    . Exercise 3x per week (30 min per  time)    . patient     May go to the beach again.     Marland Kitchen Pharmacy Care Plan     CARE PLAN ENTRY (see longitudinal plan of care for additional care plan information)  Current Barriers:  . Chronic Disease Management support, education, and care coordination needs related to Hypertension, Hyperlipidemia, Atrial  Fibrillation, Coronary Artery Disease, GERD, BPH, and Allergic Rhinitis   Hypertension BP Readings from Last 3 Encounters:  09/15/20 120/70  08/28/20 (!) 158/75  10/16/19 (!) 156/78   . Pharmacist Clinical Goal(s): o Over the next 180 days, patient will work with PharmD and providers to maintain BP goal <140/90 . Current regimen:  . Lisinopril 10 mg 1/2 tablet daily . Metoprolol tartrate 12.5 mg 1 tablet twice daily . Interventions: o Discussed DASH eating plan recommendations: . Emphasizes vegetables, fruits, and whole-grains . Includes fat-free or low-fat dairy products, fish, poultry, beans, nuts, and vegetable oils . Limits foods that are high in saturated fat. These foods include fatty meats, full-fat dairy products, and tropical oils such as coconut, palm kernel, and palm oils. . Limits sugar-sweetened beverages and sweets . Limiting sodium intake to < 1500 mg/day . Patient self care activities - Over the next 180 days, patient will: o Check blood pressure at least weekly, document, and provide at future appointments o Ensure daily salt intake < 2300 mg/day  Hyperlipidemia Lab Results  Component Value Date/Time   LDLCALC 74 09/15/2020 09:34 AM   . Pharmacist Clinical Goal(s): o Over the next 180 days, patient will work with PharmD and providers to achieve LDL goal < 70 . Current regimen:  o Atorvastatin 80 mg 1 tablet daily . Interventions: o Discussed lowering cholesterol through diet by: Marland Kitchen Limiting foods with cholesterol such as liver and other organ meats, egg yolks, shrimp, and whole milk dairy products . Avoiding saturated fats and trans fats and  incorporating healthier fats, such as lean meat, nuts, and unsaturated oils like canola and olive oils . Eating foods with soluble fiber such as whole-grain cereals such as oatmeal and oat bran, fruits such as apples, bananas, oranges, pears, and prunes, legumes such as kidney beans, lentils, chick peas, black-eyed peas, and lima beans, and green leafy vegetables . Limiting alcohol intake o Discussed recommendations for moderate aerobic exercise for 150 minutes/week spread out over 5 days for heart healthy lifestyle . Patient self care activities - Over the next 180 days, patient will: o Continue current medication  Afib Pulse Readings from Last 3 Encounters:  09/15/20 60  08/28/20 68  10/16/19 68   . Pharmacist Clinical Goal(s): o Over the next 180 days, patient will work with PharmD and providers to maintain HR < 110 bpm . Current regimen:   Warfarin 5 mg tablet 7.5 mg on Mon and Fri and 5 mg on all other days  Diltiazem 30 mg 1 tablet every 4 hours as needed for HR > 100 bpm  Metoprolol tartrate 12.5 mg 1 tablet twice daily . Interventions: o Discussed monitoring for signs of bleeding such as unexplained and excessive bleeding from a cut or injury, easy or excessive bruising, blood in urine or stools, and nosebleeds without a known cause  . Patient self care activities - Over the next 180 days, patient will: o Check heart rate along with blood pressure o Contact provider with any episodes of bleeding  CAD . Pharmacist Clinical Goal(s) o Over the next 180 days, patient will work with PharmD and providers to prevent heart events . Current regimen:  . Warfarin 5 mg tablet 7.5 mg on Mon and Fri and 5 mg on all other days . Nitroglycerin 0.4 mg SL tablet as needed . Interventions: o Discussed verifying the expiration date on nitroglycerin bottle . Patient self care activities - Over the next 180 days, patient will: o Continue current medications  BPH . Pharmacist Clinical  Goal(s) o Over the next 180 days, patient will work with PharmD and providers to manage symptoms of enlarged prostate . Current regimen:  . Tamsulosin 0.4 mg 1 capsule twice daily . Finasteride 5 mg 1 tablet daily . Interventions: o Discussed consistent administration of tamsulosin 30 minutes to 1 hour after a meal . Patient self care activities - Over the next 180 days, patient will: o Continue current medications  GERD . Pharmacist Clinical Goal(s) o Over the next 180 days, patient will work with PharmD and providers to manage symptoms of heartburn . Current regimen:  . Pantoprazole 40 mg 1 tablet daily . Interventions: o Discussed non-pharmacologic management of symptoms such as elevating the head of your bed, avoiding eating 2-3 hours before bed, avoiding triggering foods such as acidic, spicy, or fatty foods, eating smaller meals, and wearing clothes that are loose around the waist . Patient self care activities - Over the next 180 days, patient will: o Continue current medications  Allergic rhinitis . Pharmacist Clinical Goal(s) o Over the next 180 days, patient will work with PharmD and providers to manage symptoms of allergies . Current regimen:  . Claritin 10 mg 1 tablet daily . Zyrtec 10 mg 1 tablet daily . Mometasone 50 mcg/act 2 sprays in both nostrils at bedtime . Interventions: o Discussed duplication of therapy with both Claritin and Zyrtec usage  . Patient self care activities - Over the next 180 days, patient will: o Stop taking either Zyrtec or Claritin o Continue current medications  Medication management . Pharmacist Clinical Goal(s): o Over the next 180 days, patient will work with PharmD and providers to maintain optimal medication adherence . Current pharmacy: [facility] . Interventions o Comprehensive medication review performed. o Continue current medication management strategy . Patient self care activities - Over the next 180 days,  patient will: o Take medications as prescribed o Report any questions or concerns to PharmD and/or provider(s)  Initial goal documentation       Depression Screen PHQ 2/9 Scores 11/25/2020 09/15/2020 08/29/2018 01/20/2017 08/23/2014  PHQ - 2 Score 0 0 0 0 0  PHQ- 9 Score 0 - - - -    Fall Risk Fall Risk  11/25/2020 09/15/2020 10/03/2019 09/25/2018 08/29/2018  Falls in the past year? 0 0 0 0 No  Comment - - [facility] Telephone Survey: data to providers prior to load [employer] Telephone Survey: data to providers prior to load -  Number falls in past yr: 0 0 - - -  Injury with Fall? 0 0 - - -  Risk for fall due to : No Fall Risks No Fall Risks - - -  Follow up Falls evaluation completed;Falls prevention discussed Falls evaluation completed - - -    FALL RISK PREVENTION PERTAINING TO THE HOME:  Any stairs in or around the home? Yes  If so, are there any without handrails? No  Home free of loose throw rugs in walkways, pet beds, electrical cords, etc? Yes  Adequate lighting in your home to reduce risk of falls? Yes   ASSISTIVE DEVICES UTILIZED TO PREVENT FALLS:  Life alert? No  Use of a cane, walker or w/c? No  Grab bars in the bathroom? No  Shower chair or bench in shower? No  Elevated toilet seat or a handicapped toilet? Yes    Cognitive Function: MMSE - Mini Mental State Exam 01/20/2017  Not completed: (No Data)        Immunizations  Immunization History  Administered Date(s) Administered  . Fluad Quad(high Dose 65+) 07/27/2019  . Influenza Split 08/19/2011, 08/21/2012  . Influenza Whole 08/14/2007, 08/01/2008, 08/11/2009, 08/17/2010  . Influenza, High Dose Seasonal PF 08/25/2015, 08/25/2016, 07/29/2017, 07/25/2018, 09/02/2020  . Influenza,inj,Quad PF,6+ Mos 08/22/2013, 08/23/2014  . Moderna Sars-Covid-2 Vaccination 11/20/2019, 12/18/2019, 09/07/2020  . Pneumococcal Conjugate-13 03/07/2014  . Pneumococcal Polysaccharide-23 11/08/1998  . Td 06/08/2001, 07/16/2008  . Tdap  08/25/2016  . Zoster 08/24/2011    TDAP status: Up to date  Flu Vaccine status: Up to date  Pneumococcal vaccine status: Up to date  Covid-19 vaccine status: Completed vaccines  Qualifies for Shingles Vaccine? Yes   Zostavax completed Yes   Shingrix Completed?: No.    Education has been provided regarding the importance of this vaccine. Patient has been advised to call insurance company to determine out of pocket expense if they have not yet received this vaccine. Advised may also receive vaccine at local pharmacy or Health Dept. Verbalized acceptance and understanding.  Screening Tests Health Maintenance  Topic Date Due  . TETANUS/TDAP  08/25/2026  . INFLUENZA VACCINE  Completed  . COVID-19 Vaccine  Completed  . PNA vac Low Risk Adult  Completed    Health Maintenance  There are no preventive care reminders to display for this patient.  Colorectal cancer screening: No longer required.   Lung Cancer Screening: (Low Dose CT Chest recommended if Age 33-80 years, 30 pack-year currently smoking OR have quit w/in 15years.) does not qualify.   Lung Cancer Screening Referral: N/A   Additional Screening:  Hepatitis C Screening: does not qualify;   Vision Screening: Recommended annual ophthalmology exams for early detection of glaucoma and other disorders of the eye. Is the patient up to date with their annual eye exam?  Yes  Who is the provider or what is the name of the office in which the patient attends annual eye exams? Dr.Shapiro  If pt is not established with a provider, would they like to be referred to a provider to establish care? No .   Dental Screening: Recommended annual dental exams for proper oral hygiene  Community Resource Referral / Chronic Care Management: CRR required this visit?  No   CCM required this visit?  No      Plan:     I have personally reviewed and noted the following in the patient's chart:   . Medical and social history . Use of  alcohol, tobacco or illicit drugs  . Current medications and supplements . Functional ability and status . Nutritional status . Physical activity . Advanced directives . List of other physicians . Hospitalizations, surgeries, and ER visits in previous 12 months . Vitals . Screenings to include cognitive, depression, and falls . Referrals and appointments  In addition, I have reviewed and discussed with patient certain preventive protocols, quality metrics, and best practice recommendations. A written personalized care plan for preventive services as well as general preventive health recommendations were provided to patient.     Ofilia Neas, LPN   05/20/4579   Nurse Notes: None

## 2020-12-05 ENCOUNTER — Ambulatory Visit (INDEPENDENT_AMBULATORY_CARE_PROVIDER_SITE_OTHER): Payer: Medicare Other

## 2020-12-05 ENCOUNTER — Other Ambulatory Visit: Payer: Self-pay

## 2020-12-05 DIAGNOSIS — Z5181 Encounter for therapeutic drug level monitoring: Secondary | ICD-10-CM | POA: Diagnosis not present

## 2020-12-05 DIAGNOSIS — I480 Paroxysmal atrial fibrillation: Secondary | ICD-10-CM | POA: Diagnosis not present

## 2020-12-05 LAB — POCT INR: INR: 2.3 (ref 2.0–3.0)

## 2020-12-05 NOTE — Patient Instructions (Signed)
-   Continue taking Warfarin 1 tablet daily except for 1.5 tablets on Mondays and Fridays.  - Recheck INR in 6 weeks.  Call with any questions or concerns [phone]

## 2020-12-08 DIAGNOSIS — N200 Calculus of kidney: Secondary | ICD-10-CM | POA: Diagnosis not present

## 2020-12-08 DIAGNOSIS — R3121 Asymptomatic microscopic hematuria: Secondary | ICD-10-CM | POA: Diagnosis not present

## 2020-12-08 DIAGNOSIS — N400 Enlarged prostate without lower urinary tract symptoms: Secondary | ICD-10-CM | POA: Diagnosis not present

## 2020-12-26 ENCOUNTER — Other Ambulatory Visit: Payer: Self-pay

## 2020-12-29 ENCOUNTER — Ambulatory Visit (INDEPENDENT_AMBULATORY_CARE_PROVIDER_SITE_OTHER): Payer: Medicare Other | Admitting: Family Medicine

## 2020-12-29 ENCOUNTER — Encounter: Payer: Self-pay | Admitting: Family Medicine

## 2020-12-29 ENCOUNTER — Other Ambulatory Visit: Payer: Self-pay

## 2020-12-29 VITALS — BP 142/70 | HR 61 | Temp 98.3°F | Ht 70.0 in | Wt 207.2 lb

## 2020-12-29 DIAGNOSIS — M25512 Pain in left shoulder: Secondary | ICD-10-CM | POA: Diagnosis not present

## 2020-12-29 DIAGNOSIS — G8929 Other chronic pain: Secondary | ICD-10-CM

## 2020-12-29 DIAGNOSIS — G629 Polyneuropathy, unspecified: Secondary | ICD-10-CM | POA: Diagnosis not present

## 2020-12-29 NOTE — Progress Notes (Signed)
   Subjective:    Patient ID: [name] [name], male    DOB: 30-Dec-1928, 87 y.o.   MRN: [id number]  HPI Here for several issues. First he has been having pain and stiffness in both hands for about one year, but over the past few weeks he has also had numbness and tingling in the fingers of both hands. No weakness or swelling. In addition the pain in his left shoulder has gotten much worse. He has trouble reaching above his head with the left arm.    Review of Systems  Constitutional: Negative.   Respiratory: Negative.   Cardiovascular: Negative.   Musculoskeletal: Positive for arthralgias.  Neurological: Positive for numbness.       Objective:   Physical Exam Constitutional:      Appearance: Normal appearance.  Cardiovascular:     Rate and Rhythm: Normal rate and regular rhythm.     Pulses: Normal pulses.     Heart sounds: Normal heart sounds.  Pulmonary:     Effort: Pulmonary effort is normal.     Breath sounds: Normal breath sounds.  Musculoskeletal:     Comments: The left shoulder is not tender but shows crepitus. ROM is quite limited by pain. Both hands appear normal, skin is warm and pink. Normal radial pulses and capillary refill.   Neurological:     Mental Status: He is alert.           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 We will set up nerve conduction studies soon. We will check a B12 level today to investigate a possible neuropathy. His labs were normal otherwise last November, with an A1c of 6.2. we will refer to Orthopedics for the left shoulder pain. [name], MD

## 2020-12-30 DIAGNOSIS — H401132 Primary open-angle glaucoma, bilateral, moderate stage: Secondary | ICD-10-CM | POA: Diagnosis not present

## 2020-12-30 LAB — VITAMIN B12: Vitamin B-12: 707 pg/mL (ref 211–911)

## 2021-01-06 ENCOUNTER — Other Ambulatory Visit: Payer: Self-pay

## 2021-01-06 ENCOUNTER — Encounter: Payer: Self-pay | Admitting: Neurology

## 2021-01-06 DIAGNOSIS — G629 Polyneuropathy, unspecified: Secondary | ICD-10-CM

## 2021-01-12 DIAGNOSIS — M7542 Impingement syndrome of left shoulder: Secondary | ICD-10-CM | POA: Diagnosis not present

## 2021-01-13 DIAGNOSIS — Z85828 Personal history of other malignant neoplasm of skin: Secondary | ICD-10-CM | POA: Diagnosis not present

## 2021-01-13 DIAGNOSIS — L821 Other seborrheic keratosis: Secondary | ICD-10-CM | POA: Diagnosis not present

## 2021-01-13 DIAGNOSIS — D229 Melanocytic nevi, unspecified: Secondary | ICD-10-CM | POA: Diagnosis not present

## 2021-01-13 DIAGNOSIS — L570 Actinic keratosis: Secondary | ICD-10-CM | POA: Diagnosis not present

## 2021-01-13 DIAGNOSIS — L905 Scar conditions and fibrosis of skin: Secondary | ICD-10-CM | POA: Diagnosis not present

## 2021-01-13 DIAGNOSIS — L814 Other melanin hyperpigmentation: Secondary | ICD-10-CM | POA: Diagnosis not present

## 2021-01-13 DIAGNOSIS — Z85820 Personal history of malignant melanoma of skin: Secondary | ICD-10-CM | POA: Diagnosis not present

## 2021-01-16 ENCOUNTER — Other Ambulatory Visit: Payer: Self-pay

## 2021-01-16 ENCOUNTER — Ambulatory Visit (INDEPENDENT_AMBULATORY_CARE_PROVIDER_SITE_OTHER): Payer: Medicare Other

## 2021-01-16 DIAGNOSIS — I480 Paroxysmal atrial fibrillation: Secondary | ICD-10-CM | POA: Diagnosis not present

## 2021-01-16 DIAGNOSIS — Z5181 Encounter for therapeutic drug level monitoring: Secondary | ICD-10-CM

## 2021-01-16 LAB — POCT INR: INR: 2.2 (ref 2.0–3.0)

## 2021-01-16 NOTE — Patient Instructions (Signed)
Description   Continue taking Warfarin 1 tablet daily except for 1.5 tablets on Mondays and Fridays.  Recheck INR in 6 weeks. Call with any questions or concerns [phone]

## 2021-01-20 ENCOUNTER — Other Ambulatory Visit: Payer: Self-pay

## 2021-01-20 ENCOUNTER — Ambulatory Visit (INDEPENDENT_AMBULATORY_CARE_PROVIDER_SITE_OTHER): Payer: Medicare Other | Admitting: Neurology

## 2021-01-20 DIAGNOSIS — G629 Polyneuropathy, unspecified: Secondary | ICD-10-CM | POA: Diagnosis not present

## 2021-01-20 DIAGNOSIS — G5603 Carpal tunnel syndrome, bilateral upper limbs: Secondary | ICD-10-CM

## 2021-01-20 DIAGNOSIS — G5622 Lesion of ulnar nerve, left upper limb: Secondary | ICD-10-CM

## 2021-01-20 NOTE — Procedures (Signed)
[facility]  [address]  [place], [place] [address] Tel: [phone] Fax:  [phone] Test Date:  01/20/2021  Patient: [name] [name] DOB: 1929-10-28 Physician: Narda Amber, DO  Sex: Male Height: 5\' 10"  Ref Phys: [name], M.D.  ID#: [id number]   Technician:    Patient Complaints: This is a 87 year old man referred for evaluation of bilateral hand numbness and tingling.  NCV & EMG Findings: Extensive electrodiagnostic testing of the right upper extremity and additional studies of the left shows:  1. Median sensory response on the right is absent and shows prolonged latency on the left (4.5 ms).  Ulnar sensory response on the right is normal and shows prolonged latency on the left (3.3 ms).  Bilateral radial sensory responses are within normal limits. 2. Right median motor response shows severely prolonged latency (6.6 ms) and reduced amplitude (2.1 mV).  Left median motor response shows prolonged latency  (4.3 ms) and normal amplitude.  Right ulnar motor responses within normal limits.  Left ulnar motor response shows mildly slowed conduction velocity across the elbow (A Elbow-B Elbow, 48 m/s).   3. Chronic motor axonal loss changes are isolated to the right abductor pollicis brevis muscle.  These findings are not present on the left side.  Impression: 1. Right median neuropathy at or distal to the wrist (severe), consistent with a clinical diagnosis of carpal tunnel syndrome.   2. Left median neuropathy at or distal to the wrist (moderate), consistent with a clinical diagnosis of carpal tunnel syndrome.   3. Left ulnar neuropathy with slowing across the elbow, purely demyelinating, mild. 4. There is no evidence of a sensorimotor polyneuropathy or cervical radiculopathy affecting the upper extremities.    ___________________________ Narda Amber, DO    Nerve Conduction Studies Anti Sensory Summary Table   Stim Site NR Peak (ms) Norm Peak (ms) P-T Amp  (V) Norm P-T Amp  Left Median Anti Sensory (2nd Digit)  33C  Wrist    4.5 <3.8 10.4 >10  Right Median Anti Sensory (2nd Digit)  33C  Wrist NR  <3.8  >10  Left Radial Anti Sensory (Base 1st Digit)  33C  Wrist    2.3 <2.8 12.0 >10  Right Radial Anti Sensory (Base 1st Digit)  33C  Wrist    2.3 <2.8 16.9 >10  Left Ulnar Anti Sensory (5th Digit)  33C  Wrist    3.3 <3.2 7.4 >5  Right Ulnar Anti Sensory (5th Digit)  33C  Wrist    3.0 <3.2 6.9 >5   Motor Summary Table   Stim Site NR Onset (ms) Norm Onset (ms) O-P Amp (mV) Norm O-P Amp Site1 Site2 Delta-0 (ms) Dist (cm) Vel (m/s) Norm Vel (m/s)  Left Median Motor (Abd Poll Brev)  33C  Wrist    4.3 <4.0 8.0 >5 Elbow Wrist 5.2 30.0 58 >50  Elbow    9.5  7.6         Right Median Motor (Abd Poll Brev)  33C  Wrist    6.6 <4.0 2.1 >5 Elbow Wrist 5.8 30.0 52 >50  Elbow    12.4  1.8         Left Ulnar Motor (Abd Dig Minimi)  33C  Wrist    2.6 <3.1 9.0 >7 B Elbow Wrist 3.7 24.0 65 >50  B Elbow    6.3  8.7  A Elbow B Elbow 2.1 10.0 48 >50  A Elbow    8.4  8.7  Right Ulnar Motor (Abd Dig Minimi)  33C  Wrist    2.7 <3.1 10.8 >7 B Elbow Wrist 4.3 25.0 58 >50  B Elbow    7.0  10.4  A Elbow B Elbow 1.6 10.0 63 >50  A Elbow    8.6  10.1          EMG   Side Muscle Ins Act Fibs Psw Fasc Number Recrt Dur Dur. Amp Amp. Poly Poly. Comment  Right 1stDorInt Nml Nml Nml Nml Nml Nml Nml Nml Nml Nml Nml Nml N/A  Right Abd Poll Brev Nml Nml Nml Nml 3- Rapid All 1+ All 1+ All 1+ N/A  Right PronatorTeres Nml Nml Nml Nml Nml Nml Nml Nml Nml Nml Nml Nml N/A  Right Biceps Nml Nml Nml Nml Nml Nml Nml Nml Nml Nml Nml Nml N/A  Right Triceps Nml Nml Nml Nml Nml Nml Nml Nml Nml Nml Nml Nml N/A  Right Deltoid Nml Nml Nml Nml Nml Nml Nml Nml Nml Nml Nml Nml N/A  Left 1stDorInt Nml Nml Nml Nml Nml Nml Nml Nml Nml Nml Nml Nml N/A  Left Abd Poll Brev Nml Nml Nml Nml Nml Nml Nml Nml Nml Nml Nml Nml N/A  Left PronatorTeres Nml Nml Nml Nml Nml Nml Nml Nml Nml  Nml Nml Nml N/A  Left Biceps Nml Nml Nml Nml Nml Nml Nml Nml Nml Nml Nml Nml N/A  Left Triceps Nml Nml Nml Nml Nml Nml Nml Nml Nml Nml Nml Nml N/A  Left Deltoid Nml Nml Nml Nml Nml Nml Nml Nml Nml Nml Nml Nml N/A  Left FlexCarpiUln Nml Nml Nml Nml Nml Nml Nml Nml Nml Nml Nml Nml N/A      Waveforms:

## 2021-01-26 ENCOUNTER — Telehealth: Payer: Self-pay | Admitting: Pharmacist

## 2021-01-26 NOTE — Chronic Care Management (AMB) (Signed)
Chronic Care Management Pharmacy Assistant   Name: [name] [name]  MRN: [id number] DOB: [birth year]  Reason for Encounter: Disease State   Conditions to be addressed/monitored: HTN  Recent office visits:  . 02.21.2022 [name], MD Family Medicine  Recent consult visits:  . 02.22.2022 Hyman Hopes, MD Ophthalmology  Hospital visits:  None in previous 6 months  Medications: Outpatient Encounter Medications as of 01/26/2021  Medication Sig  . atorvastatin (LIPITOR) 80 MG tablet Take 1 tablet (80 mg total) by mouth daily. (Patient taking differently: Take 80 mg by mouth at bedtime.)  . cetirizine (ZYRTEC) 10 MG tablet Take 10 mg by mouth every morning.  Marland Kitchen CLARITIN 10 MG CAPS Take 1 capsule by mouth daily.  . [facility] ACETAMINOPHEN 325 MG CAPS Take 1 capsule by mouth 2 (two) times daily.   Marland Kitchen diltiazem (CARDIZEM) 30 MG tablet Take 1 tablet every 4 hours AS NEEDED for afib heart rate over 100  . finasteride (PROSCAR) 5 MG tablet Take 5 mg daily by mouth.  Marland Kitchen HYDROcodone-homatropine (HYCODAN) 5-1.5 MG/5ML syrup Take 5 mLs by mouth every 4 (four) hours as needed for cough.  . latanoprost (XALATAN) 0.005 % ophthalmic solution Place 1 drop into both eyes at bedtime.   Marland Kitchen lisinopril (PRINIVIL,ZESTRIL) 10 MG tablet Take 10 mg by mouth at bedtime.   . metoprolol tartrate (LOPRESSOR) 12.5 mg TABS tablet Take 12.5 mg by mouth 2 (two) times daily.  . mometasone (NASONEX) 50 MCG/ACT nasal spray Place 2 sprays into both nostrils daily as needed (allergies).  . Multiple Vitamins-Minerals (CENTRUM SILVER PO) Take 1 tablet by mouth every morning.  . nitroGLYCERIN (NITROSTAT) 0.4 MG SL tablet Place 1 tablet (0.4 mg total) under the tongue every 5 (five) minutes as needed for chest pain.  . pantoprazole (PROTONIX) 40 MG tablet Take 1 tablet (40 mg total) by mouth daily.  . potassium chloride (KLOR-CON) 10 MEQ CR tablet Take 10 mEq by mouth every morning.  . tamsulosin (FLOMAX) 0.4 MG CAPS Take  0.4 mg by mouth 2 (two) times daily.   . TOLAK 4 % CREA Apply 1 application topically daily as needed.  . warfarin (COUMADIN) 5 MG tablet TAKE 1 AND 1/2 TABLETS BY MOUTH DAILY ASDIRECTED   No facility-administered encounter medications on file as of 01/26/2021.   Reviewed chart prior to disease state call. Spoke with patient regarding BP  Recent Office Vitals: BP Readings from Last 3 Encounters:  12/29/20 (!) 142/70  09/15/20 120/70  08/28/20 (!) 158/75   Pulse Readings from Last 3 Encounters:  12/29/20 61  09/15/20 60  08/28/20 68    Wt Readings from Last 3 Encounters:  12/29/20 207 lb 3.2 oz (94 kg)  09/15/20 198 lb 12.8 oz (90.2 kg)  08/28/20 203 lb 9.6 oz (92.4 kg)     Kidney Function Lab Results  Component Value Date/Time   CREATININE 1.07 09/15/2020 09:34 AM   CREATININE 1.08 09/05/2019 10:12 AM   CREATININE 1.02 08/29/2018 09:42 AM   CREATININE 1.02 03/15/2016 11:18 AM   GFR 64.50 09/05/2019 10:12 AM   GFRNONAA 61 09/15/2020 09:34 AM   GFRAA 71 09/15/2020 09:34 AM    BMP Latest Ref Rng & Units 09/15/2020 09/05/2019 08/29/2018  Glucose 65 - 99 mg/dL 111(H) 108(H) 102(H)  BUN 7 - 25 mg/dL 19 28(H) 22  Creatinine 0.70 - 1.11 mg/dL 1.07 1.08 1.02  BUN/Creat Ratio 6 - 22 (calc) NOT APPLICABLE - -  Sodium 163 - 146 mmol/L 137  140 142  Potassium 3.5 - 5.3 mmol/L 5.1 4.4 4.1  Chloride 98 - 110 mmol/L 98 102 103  CO2 20 - 32 mmol/L 27 31 33(H)  Calcium 8.6 - 10.3 mg/dL 9.7 9.2 9.4   . Current antihypertensive regimen:  o Lisinopril 10 mg 1/2 tablet daily o Metoprolol tartrate 12.5 mg 1 tablet twice daily . How often are you checking your Blood Pressure? 1-2x per week . Current home BP readings:  - 02/24 142/73 - 03/06 136/76 - 03/09 138/76 What recent interventions/DTPs have been made by any provider to improve Blood Pressure control since last CPP Visit: None . Any recent hospitalizations or ED visits since last visit with CPP? No . What diet changes have been  made to improve Blood Pressure Control?  o None . What exercise is being done to improve your Blood Pressure Control?  o None  Adherence Review: Is the patient currently on ACE/ARB medication? No Does the patient have >5 day gap between last estimated fill dates? No  I spoke with the patient and discussed medication adherence. He has no issues currently with his current medication. He states he was recently diagnosed with carpal tunnel. He states that his hands have been feeling better for the past two weeks. No recent changes to his medications. He denies ED visits since his last CPP follow-up. The patient denies any side effects with his medication. Also, denies any problems with his current pharmacy   Neita Goodnight) Mare Ferrari, [place] Assistant [phone]

## 2021-01-30 ENCOUNTER — Encounter: Payer: Self-pay | Admitting: Family Medicine

## 2021-01-30 MED ORDER — DOCUSATE SODIUM 100 MG PO CAPS: 100.0000 mg | ORAL_CAPSULE | Freq: Two times a day (BID) | ORAL | 5 refills | Status: DC

## 2021-01-30 MED ORDER — CIPROFLOXACIN HCL 500 MG PO TABS: 500.0000 mg | ORAL_TABLET | Freq: Two times a day (BID) | ORAL | 0 refills | Status: DC

## 2021-01-30 NOTE — Telephone Encounter (Signed)
I sent in for Cipro and Colace

## 2021-02-04 ENCOUNTER — Other Ambulatory Visit: Payer: Self-pay

## 2021-02-04 ENCOUNTER — Encounter: Payer: Self-pay | Admitting: Family Medicine

## 2021-02-04 DIAGNOSIS — R609 Edema, unspecified: Secondary | ICD-10-CM

## 2021-02-04 MED ORDER — FUROSEMIDE 20 MG PO TABS: ORAL_TABLET | ORAL | 1 refills | Status: DC

## 2021-02-04 NOTE — Telephone Encounter (Signed)
Call in Lasix 20 mg to take once a day as needed for fluid retention, #90 with one rf

## 2021-02-04 NOTE — Telephone Encounter (Signed)
Make him an in person OV to see me

## 2021-02-04 NOTE — Telephone Encounter (Signed)
Pt is scheduled for an office visit on 02/06/2021

## 2021-02-06 ENCOUNTER — Ambulatory Visit (INDEPENDENT_AMBULATORY_CARE_PROVIDER_SITE_OTHER): Payer: Medicare Other | Admitting: Family Medicine

## 2021-02-06 ENCOUNTER — Other Ambulatory Visit: Payer: Self-pay

## 2021-02-06 ENCOUNTER — Encounter: Payer: Self-pay | Admitting: Family Medicine

## 2021-02-06 VITALS — BP 118/68 | HR 73 | Temp 98.3°F | Wt 194.0 lb

## 2021-02-06 DIAGNOSIS — K5900 Constipation, unspecified: Secondary | ICD-10-CM | POA: Diagnosis not present

## 2021-02-06 MED ORDER — POLYETHYLENE GLYCOL 3350 17 GM/SCOOP PO POWD: 17.0000 g | Freq: Every day | ORAL | 1 refills | Status: DC

## 2021-02-06 NOTE — Progress Notes (Signed)
   Subjective:    Patient ID: [name] [name], male    DOB: 11/30/1928, 87 y.o.   MRN: [id number]  HPI Here for 4 weeks of constipation. He is able to pass a small stool every 2-3 days. He had some LLQ pain consistent with diverticulitis last week so we started him on Cipro. The pain has now resolved. He denies any nausea or fever. He drinks plenty of water every day. He has tried Milk of Magnesia a few times as well as Dulcolux, with no improvement. Appetite is normal.    Review of Systems  Constitutional: Negative.   Respiratory: Negative.   Cardiovascular: Negative.   Gastrointestinal: Positive for constipation. Negative for abdominal distention, abdominal pain, anal bleeding, blood in stool, diarrhea, nausea, rectal pain and vomiting.  Genitourinary: Negative.        Objective:   Physical Exam Constitutional:      Appearance: Normal appearance. He is not ill-appearing.  Cardiovascular:     Rate and Rhythm: Normal rate and regular rhythm.     Pulses: Normal pulses.     Heart sounds: Normal heart sounds.  Pulmonary:     Effort: Pulmonary effort is normal.     Breath sounds: Normal breath sounds.  Abdominal:     General: Abdomen is flat. Bowel sounds are normal. There is no distension.     Palpations: Abdomen is soft. There is no mass.     Tenderness: There is no abdominal tenderness. There is no guarding or rebound.     Hernia: No hernia is present.  Genitourinary:    Rectum: Normal.  Neurological:     Mental Status: He is alert.           Assessment & Plan:  Constipation. He will start using Miralax every day, and he will add MOM one tbsp every 6 hours as needed of he feels constipated. Recheck as needed.  [name], MD

## 2021-02-18 ENCOUNTER — Encounter: Payer: Medicare Other | Admitting: Neurology

## 2021-02-24 ENCOUNTER — Ambulatory Visit (INDEPENDENT_AMBULATORY_CARE_PROVIDER_SITE_OTHER): Payer: Medicare Other | Admitting: Family Medicine

## 2021-02-24 ENCOUNTER — Encounter: Payer: Self-pay | Admitting: Family Medicine

## 2021-02-24 ENCOUNTER — Other Ambulatory Visit: Payer: Self-pay

## 2021-02-24 VITALS — BP 118/70 | HR 82 | Temp 98.6°F | Wt 192.4 lb

## 2021-02-24 DIAGNOSIS — K56600 Partial intestinal obstruction, unspecified as to cause: Secondary | ICD-10-CM

## 2021-02-24 DIAGNOSIS — R933 Abnormal findings on diagnostic imaging of other parts of digestive tract: Secondary | ICD-10-CM | POA: Diagnosis not present

## 2021-02-24 DIAGNOSIS — K5900 Constipation, unspecified: Secondary | ICD-10-CM | POA: Diagnosis not present

## 2021-02-24 NOTE — Progress Notes (Signed)
   Subjective:    Patient ID: [name] [name], male    DOB: 1929-10-02, 87 y.o.   MRN: [id number]  HPI Here for constipation and with concerns that he may have a bowel obstruction. His current issues started about 3 weeks ago. He felt that he was bloated and he belched and passed flatus more than usual. His stools became much smaller and loose, and he was having a stool only once every 2-3 days. This is very unusual for him. He is drinking plenty of water. He has been using Miralax once daily, and he is taking Milk of Magnesia 4-5 times a day. He denies any pain or nausea or fever.    Review of Systems  Constitutional: Negative.   Respiratory: Negative.   Cardiovascular: Negative.   Gastrointestinal: Positive for abdominal distention and constipation. Negative for abdominal pain, anal bleeding, blood in stool, diarrhea, nausea, rectal pain and vomiting.  Genitourinary: Negative.        Objective:   Physical Exam Constitutional:      Appearance: Normal appearance. He is not ill-appearing.  Cardiovascular:     Rate and Rhythm: Normal rate and regular rhythm.     Pulses: Normal pulses.     Heart sounds: Normal heart sounds.  Pulmonary:     Effort: Pulmonary effort is normal.     Breath sounds: Normal breath sounds.  Abdominal:     General: There is distension.     Palpations: There is no mass.     Tenderness: There is no abdominal tenderness. There is no guarding or rebound.     Hernia: No hernia is present.     Comments: Bowel sounds are hyperdynamic   Genitourinary:    Prostate: Normal.     Rectum: Normal. Guaiac result negative.  Neurological:     Mental Status: He is alert.           Assessment & Plan:  Constipation, we need to rule out an obstruction. Get labs today and we will set up a contrasted CT of the abdomen and pelvis soon. He will use one bottle of magnesium citrate tonight.  [name], MD

## 2021-02-25 LAB — CBC WITH DIFFERENTIAL/PLATELET
Basophils Absolute: 0.1 10*3/uL (ref 0.0–0.1)
Basophils Relative: 1.3 % (ref 0.0–3.0)
Eosinophils Absolute: 0.1 10*3/uL (ref 0.0–0.7)
Eosinophils Relative: 0.9 % (ref 0.0–5.0)
HCT: 40.4 % (ref 39.0–52.0)
Hemoglobin: 13.8 g/dL (ref 13.0–17.0)
Lymphocytes Relative: 20.4 % (ref 12.0–46.0)
Lymphs Abs: 1.8 10*3/uL (ref 0.7–4.0)
MCHC: 34.2 g/dL (ref 30.0–36.0)
MCV: 95.7 fl (ref 78.0–100.0)
Monocytes Absolute: 0.8 10*3/uL (ref 0.1–1.0)
Monocytes Relative: 9.1 % (ref 3.0–12.0)
Neutro Abs: 5.9 10*3/uL (ref 1.4–7.7)
Neutrophils Relative %: 68.3 % (ref 43.0–77.0)
Platelets: 195 10*3/uL (ref 150.0–400.0)
RBC: 4.22 Mil/uL (ref 4.22–5.81)
RDW: 14.7 % (ref 11.5–15.5)
WBC: 8.6 10*3/uL (ref 4.0–10.5)

## 2021-02-25 LAB — BASIC METABOLIC PANEL
BUN: 20 mg/dL (ref 6–23)
CO2: 31 mEq/L (ref 19–32)
Calcium: 9.4 mg/dL (ref 8.4–10.5)
Chloride: 100 mEq/L (ref 96–112)
Creatinine, Ser: 1.09 mg/dL (ref 0.40–1.50)
GFR: 60.11 mL/min (ref >60.00)
Glucose, Bld: 94 mg/dL (ref 70–99)
Potassium: 4.4 mEq/L (ref 3.5–5.1)
Sodium: 139 mEq/L (ref 135–145)

## 2021-02-26 ENCOUNTER — Ambulatory Visit
Admission: RE | Admit: 2021-02-26 | Discharge: 2021-02-26 | Disposition: A | Discharge status: Home / Self Care | Payer: Medicare Other | Source: Ambulatory Visit | Attending: Family Medicine | Admitting: Family Medicine

## 2021-02-26 ENCOUNTER — Other Ambulatory Visit: Payer: Self-pay

## 2021-02-26 DIAGNOSIS — K56600 Partial intestinal obstruction, unspecified as to cause: Secondary | ICD-10-CM

## 2021-02-26 DIAGNOSIS — N400 Enlarged prostate without lower urinary tract symptoms: Secondary | ICD-10-CM | POA: Diagnosis not present

## 2021-02-26 DIAGNOSIS — K5750 Diverticulosis of both small and large intestine without perforation or abscess without bleeding: Secondary | ICD-10-CM | POA: Diagnosis not present

## 2021-02-26 DIAGNOSIS — K5900 Constipation, unspecified: Secondary | ICD-10-CM | POA: Diagnosis not present

## 2021-02-26 DIAGNOSIS — N200 Calculus of kidney: Secondary | ICD-10-CM | POA: Diagnosis not present

## 2021-02-26 MED ORDER — IOPAMIDOL (ISOVUE-300) INJECTION 61%
100.0000 mL | Freq: Once | INTRAVENOUS | Status: AC | PRN
  Administered 2021-02-26: 100 mL via INTRAVENOUS

## 2021-02-27 ENCOUNTER — Ambulatory Visit (INDEPENDENT_AMBULATORY_CARE_PROVIDER_SITE_OTHER): Payer: Medicare Other | Admitting: *Deleted

## 2021-02-27 ENCOUNTER — Other Ambulatory Visit: Payer: Self-pay

## 2021-02-27 DIAGNOSIS — Z5181 Encounter for therapeutic drug level monitoring: Secondary | ICD-10-CM | POA: Diagnosis not present

## 2021-02-27 DIAGNOSIS — I480 Paroxysmal atrial fibrillation: Secondary | ICD-10-CM | POA: Diagnosis not present

## 2021-02-27 LAB — POCT INR: INR: 2.6 (ref 2.0–3.0)

## 2021-02-27 NOTE — Addendum Note (Signed)
Addended by: [name] A on: 02/27/2021 05:11 PM   Modules accepted: Orders

## 2021-02-27 NOTE — Patient Instructions (Signed)
Description   Continue taking Warfarin 1 tablet daily except for 1.5 tablets on Mondays and Fridays.  Recheck INR in 6 weeks. Call with any questions or concerns [phone]      

## 2021-02-28 NOTE — Progress Notes (Signed)
Cardiology Office Note   Date:  03/02/2021   ID:  [name] [name], DOB 09/06/1929, MRN [id number]  PCP:  [name], MD  Cardiologist:   [name], MD   Chief Complaint  Patient presents with  . Follow-up  . Atrial Fibrillation      History of Present Illness: [name] [name] is a 87 y.o. male who who presents for follow up of PAF.  He was in the ED on late December 2018 with PAF. He saw [name] and considered ablation vs amiodarone.  He decided to pursue medical management.    Since I last saw him he has done well.  He had 1 episode of fibrillation he says when he exerted himself try to get bed covered on.  Last for about 12 to 13 hours.  This was on March 30.  The rate went up to about 100.  Otherwise he has done well.  He exercises 4 times a week on a treadmill or so and he has no chest discomfort, neck or arm discomfort.  Has had no shortness of breath, PND or orthopnea.  He has had constipation and he had a CT with some mild thickening of his large intestine.  He has had about a 10 pound weight loss over the last 6 months.  He is going to have follow-up with Dr. Sarajane Jews to determine next steps.  He would consent to colonoscopy if suggested.  Past Medical History:  Diagnosis Date  . Coronary artery disease    a. LHC on 04/06/16 which revealed 40% occl pRCA, 85% occl D1, 75% occl Ramus, 65% occl mLCx, 45% occl pLAD, 90% occl pLAD and nl LV function. s/p PCI/DES to prox LAD. Medical therapy for diag disease.    Marland Kitchen GERD (gastroesophageal reflux disease)   . Glaucoma   . Hemorrhoids    internal  . HTN (hypertension)   . Hx of colonic polyps    tubular adenoma   . Hyperlipidemia   . Kidney stone   . Melanoma of cheek ([place])   . Nephrolithiasis   . Pancolonic diverticulosis   . Paroxysmal atrial fibrillation (HCC)    a. s/p ablation 12/2014: on coumadin    Past Surgical History:  Procedure Laterality Date  . ATRIAL FIBRILLATION ABLATION N/A 11/12/2014   Procedure: ATRIAL  FIBRILLATION ABLATION;  Surgeon: [name], MD;  Location: [facility] CATH LAB;  Service: Cardiovascular;  Laterality: N/A;  . BLADDER SURGERY     ruptured blood vessel in the bladder 3 weeks S/P ureter stones removed  . CARDIAC CATHETERIZATION N/A 04/06/2016   Procedure: Left Heart Cath and Coronary Angiography;  Surgeon: [name], MD;  Location: [facility] CV LAB;  Service: Cardiovascular;  Laterality: N/A;  . CARDIAC CATHETERIZATION N/A 04/06/2016   Procedure: Coronary Stent Intervention;  Surgeon: [name], MD;  Location: [facility] CV LAB;  Service: Cardiovascular;  Laterality: N/A;  . CATARACT EXTRACTION W/ INTRAOCULAR LENS  IMPLANT, BILATERAL Bilateral   . COLONOSCOPY  06-29-12   per Dr. [name], adenomatous polyps, repeat in 3 yrs   . CORONARY ANGIOPLASTY WITH STENT PLACEMENT  04/06/2016   "1 stent"  . CYSTOSCOPY/RETROGRADE/URETEROSCOPY/STONE EXTRACTION WITH BASKET Left    removed 2 stones from left ureter  . INGUINAL HERNIA REPAIR Right 04/1990   Dr. [name]  . LAPAROSCOPIC CHOLECYSTECTOMY  02/1990   Dr. [name]  . MELANOMA EXCISION Right    "@ cheek near ear"  . TEE WITHOUT CARDIOVERSION  N/A 11/11/2014   Procedure: TRANSESOPHAGEAL ECHOCARDIOGRAM (TEE);  Surgeon: [name], MD;  Location: [facility] ENDOSCOPY;  Service: Cardiovascular;  Laterality: N/A;  needs INR before case  . VASECTOMY       Current Outpatient Medications  Medication Sig Dispense Refill  . atorvastatin (LIPITOR) 80 MG tablet Take 1 tablet (80 mg total) by mouth daily. (Patient taking differently: Take 80 mg by mouth at bedtime.) 90 tablet 3  . cetirizine (ZYRTEC) 10 MG tablet Take 10 mg by mouth every morning.    . ciprofloxacin (CIPRO) 500 MG tablet Take 1 tablet (500 mg total) by mouth 2 (two) times daily. 20 tablet 0  . CLARITIN 10 MG CAPS Take 1 capsule by mouth daily.    . [facility]S ACETAMINOPHEN 325 MG CAPS Take 1 capsule by mouth 2 (two) times daily.     Marland Kitchen diltiazem (CARDIZEM) 30 MG tablet Take 1 tablet  every 4 hours AS NEEDED for afib heart rate over 100 45 tablet 1  . docusate sodium (COLACE) 100 MG capsule Take 1 capsule (100 mg total) by mouth 2 (two) times daily. 60 capsule 5  . finasteride (PROSCAR) 5 MG tablet Take 5 mg daily by mouth.    . furosemide (LASIX) 20 MG tablet Take 1 tab daily as needed for fluid retention. 90 tablet 1  . HYDROcodone-homatropine (HYCODAN) 5-1.5 MG/5ML syrup Take 5 mLs by mouth every 4 (four) hours as needed for cough. 240 mL 0  . latanoprost (XALATAN) 0.005 % ophthalmic solution Place 1 drop into both eyes at bedtime.     Marland Kitchen lisinopril (PRINIVIL,ZESTRIL) 10 MG tablet Take 10 mg by mouth at bedtime.     . metoprolol tartrate (LOPRESSOR) 12.5 mg TABS tablet Take 12.5 mg by mouth 2 (two) times daily.    . mometasone (NASONEX) 50 MCG/ACT nasal spray Place 2 sprays into both nostrils daily as needed (allergies).    . Multiple Vitamins-Minerals (CENTRUM SILVER PO) Take 1 tablet by mouth every morning.    . nitroGLYCERIN (NITROSTAT) 0.4 MG SL tablet Place 1 tablet (0.4 mg total) under the tongue every 5 (five) minutes as needed for chest pain. 30 tablet 12  . pantoprazole (PROTONIX) 40 MG tablet Take 1 tablet (40 mg total) by mouth daily. 90 tablet 3  . polyethylene glycol powder (GLYCOLAX/MIRALAX) 17 GM/SCOOP powder Take 17 g by mouth daily. 3350 g 1  . potassium chloride (KLOR-CON) 10 MEQ CR tablet Take 10 mEq by mouth every morning.    . tamsulosin (FLOMAX) 0.4 MG CAPS Take 0.4 mg by mouth 2 (two) times daily.     . TOLAK 4 % CREA Apply 1 application topically daily as needed.    . warfarin (COUMADIN) 5 MG tablet TAKE 1 AND 1/2 TABLETS BY MOUTH DAILY ASDIRECTED 135 tablet 1   No current facility-administered medications for this visit.    Allergies:   Sulfa antibiotics and Sulfamethoxazole    ROS:  Please see the history of present illness.   Otherwise, review of systems are positive for none.   All other systems are reviewed and negative.    PHYSICAL  EXAM: VS:  BP (!) 142/72   Ht 5\' 11"  (1.803 m)   Wt 188 lb 12.8 oz (85.6 kg)   SpO2 98%   BMI 26.33 kg/m  , BMI Body mass index is 26.33 kg/m. GENERAL:  Well appearing NECK:  No jugular venous distention, waveform within normal limits, carotid upstroke brisk and symmetric, no bruits, no thyromegaly LUNGS:  Clear to auscultation bilaterally CHEST:  Unremarkable HEART:  PMI not displaced or sustained,S1 and S2 within normal limits, no S3, no S4, no clicks, no rubs, no murmurs ABD:  Flat, positive bowel sounds normal in frequency in pitch, no bruits, no rebound, no guarding, no midline pulsatile mass, no hepatomegaly, no splenomegaly EXT:  2 plus pulses throughout, no edema, no cyanosis no clubbing  EKG:  EKG is not ordered today.    Recent Labs: 09/15/2020: ALT 34; TSH 1.93 02/24/2021: BUN 20; Creatinine, Ser 1.09; Hemoglobin 13.8; Platelets 195.0; Potassium 4.4; Sodium 139    Lipid Panel    Component Value Date/Time   CHOL 143 09/15/2020 0934   TRIG 135 09/15/2020 0934   HDL 47 09/15/2020 0934   CHOLHDL 3.0 09/15/2020 0934   VLDL 31.6 09/05/2019 1012   LDLCALC 74 09/15/2020 0934      Wt Readings from Last 3 Encounters:  03/02/21 188 lb 12.8 oz (85.6 kg)  02/24/21 192 lb 6.4 oz (87.3 kg)  02/06/21 194 lb (88 kg)      Other studies Reviewed: Additional studies/ records that were reviewed today include: Labs, CT. Review of the above records demonstrates:  Please see elsewhere in the note.     ASSESSMENT AND PLAN:  PAF:     [name] [name] has a CHA2DS2 - VASc score of 3.    He has had 1 symptomatic paroxysms that was not particular bothersome to him and self-limited.  No change in therapy.  He likes taking his warfarin and does not want a switch to a DOAC.  CAD:   The patient has no new sypmtoms.  No further cardiovascular testing is indicated.  We will continue with aggressive risk reduction and meds as listed.  HTN:  BP is elevated but I reviewed his home  readings and it is typically at target.  No change in therapy.   Current medicines are reviewed at length with the patient today.  The patient does not have concerns regarding medicines.  The following changes have been made:  None  Labs/ tests ordered today include:  None  No orders of the defined types were placed in this encounter.    Disposition:   FU with me in 6 months.     Signed, [name], MD  03/02/2021 10:53 AM    [facility]

## 2021-03-02 ENCOUNTER — Other Ambulatory Visit: Payer: Self-pay

## 2021-03-02 ENCOUNTER — Encounter: Payer: Self-pay | Admitting: Cardiology

## 2021-03-02 ENCOUNTER — Ambulatory Visit (INDEPENDENT_AMBULATORY_CARE_PROVIDER_SITE_OTHER): Payer: Medicare Other | Admitting: Cardiology

## 2021-03-02 ENCOUNTER — Encounter: Payer: Self-pay | Admitting: Family Medicine

## 2021-03-02 VITALS — BP 142/72 | Ht 71.0 in | Wt 188.8 lb

## 2021-03-02 DIAGNOSIS — I10 Essential (primary) hypertension: Secondary | ICD-10-CM

## 2021-03-02 DIAGNOSIS — I480 Paroxysmal atrial fibrillation: Secondary | ICD-10-CM | POA: Diagnosis not present

## 2021-03-02 DIAGNOSIS — I2510 Atherosclerotic heart disease of native coronary artery without angina pectoris: Secondary | ICD-10-CM

## 2021-03-02 MED ORDER — WARFARIN SODIUM 5 MG PO TABS: ORAL_TABLET | ORAL | 0 refills | Status: DC

## 2021-03-02 NOTE — Patient Instructions (Addendum)
Medication Instructions:  ?Your physician recommends that you continue on your current medications as directed. Please refer to the Current Medication list given to you today. ? ?*If you need a refill on your cardiac medications before your next appointment, please call your pharmacy* ? ?Lab Work: ?NONE ordered at this time of appointment  ? ?If you have labs (blood work) drawn today and your tests are completely normal, you will receive your results only by: ?MyChart Message (if you have MyChart) OR ?A paper copy in the mail ?If you have any lab test that is abnormal or we need to change your treatment, we will call you to review the results. ? ?Testing/Procedures: ?NONE ordered at this time of appointment  ? ?Follow-Up: ?At [facility], you and your health needs are our priority.  As part of our continuing mission to provide you with exceptional heart care, we have created designated Provider Care Teams.  These Care Teams include your primary Cardiologist (physician) and Advanced Practice Providers (APPs -  Physician Assistants and Nurse Practitioners) who all work together to provide you with the care you need, when you need it. ? ?Your next appointment:   ?6 month(s) ? ?The format for your next appointment:   ?In Person ? ?Provider:   ?James Hochrein, MD   ? ?Other Instructions ? ? ?

## 2021-03-03 ENCOUNTER — Encounter: Payer: Self-pay | Admitting: Family Medicine

## 2021-03-03 ENCOUNTER — Encounter: Payer: Self-pay | Admitting: Physician Assistant

## 2021-03-03 NOTE — Telephone Encounter (Signed)
As I said in my Result Note, I want him to see Dr. [name] (GI) because I think he needs an endoscopy of some sort. I have done the referral

## 2021-03-03 NOTE — Telephone Encounter (Signed)
Spoke with pt verbalized of Dr Sarajane Jews advise to see GI Dr [name]

## 2021-03-03 NOTE — Telephone Encounter (Signed)
Yes continue to take both of these

## 2021-03-04 ENCOUNTER — Telehealth: Payer: Self-pay

## 2021-03-04 NOTE — Telephone Encounter (Signed)
I suggest he see Nicoletta Ba (the PA) on May 11 as scheduled. She is very good. At that point I think things will move quickly, because I do not think he needs a full colonoscopy. Dr. [name] could likely do a flexible sigmoidoscopy instead, which would be quicker to schedule

## 2021-03-04 NOTE — Telephone Encounter (Signed)
Dr Sarajane Jews advise was sent to pt on MyChart

## 2021-03-05 ENCOUNTER — Other Ambulatory Visit: Payer: Medicare Other

## 2021-03-08 ENCOUNTER — Encounter: Payer: Self-pay | Admitting: Family Medicine

## 2021-03-09 ENCOUNTER — Encounter: Payer: Self-pay | Admitting: Family Medicine

## 2021-03-09 NOTE — Telephone Encounter (Signed)
See my answer to his other message this morning.

## 2021-03-09 NOTE — Telephone Encounter (Signed)
That sounds good. Hopefull he can manage until he sees GI on May 11

## 2021-03-18 ENCOUNTER — Ambulatory Visit (INDEPENDENT_AMBULATORY_CARE_PROVIDER_SITE_OTHER): Payer: Medicare Other | Admitting: Physician Assistant

## 2021-03-18 ENCOUNTER — Encounter: Payer: Self-pay | Admitting: Physician Assistant

## 2021-03-18 ENCOUNTER — Other Ambulatory Visit: Payer: Self-pay

## 2021-03-18 ENCOUNTER — Other Ambulatory Visit: Payer: Medicare Other

## 2021-03-18 ENCOUNTER — Ambulatory Visit (INDEPENDENT_AMBULATORY_CARE_PROVIDER_SITE_OTHER)
Admission: RE | Admit: 2021-03-18 | Discharge: 2021-03-18 | Disposition: A | Discharge status: Home / Self Care | Payer: Medicare Other | Source: Ambulatory Visit | Attending: Physician Assistant | Admitting: Physician Assistant

## 2021-03-18 ENCOUNTER — Telehealth: Payer: Self-pay

## 2021-03-18 VITALS — BP 124/68 | HR 64 | Ht 71.0 in | Wt 189.0 lb

## 2021-03-18 DIAGNOSIS — R194 Change in bowel habit: Secondary | ICD-10-CM

## 2021-03-18 DIAGNOSIS — K6389 Other specified diseases of intestine: Secondary | ICD-10-CM | POA: Diagnosis not present

## 2021-03-18 DIAGNOSIS — K5900 Constipation, unspecified: Secondary | ICD-10-CM

## 2021-03-18 DIAGNOSIS — R933 Abnormal findings on diagnostic imaging of other parts of digestive tract: Secondary | ICD-10-CM

## 2021-03-18 NOTE — Progress Notes (Signed)
Subjective:    Patient ID: [name] [name], male    DOB: [birth year], 87 y.o.   MRN: [id number]  HPI [name] [name] is a pleasant 87 year old white male, known to Dr. [name] and last seen here in 2016.  He is referred today by Dr. Sarajane Jews for significant change in bowel habits and abnormal CT. Patient has history of hypertension, atrial fibrillation for which she is on Coumadin, GERD, hyperlipidemia and coronary artery disease status post prior PTCA. He last had colonoscopy in August 2013 with removal of 5 sessile polyps all 3 to 10 mm in size and was noted to have severe diverticular disease throughout the colon as well as internal hemorrhoids.  Path on the polyps showed 3 of them to be tubular adenomas and there were 2 diminutive polyps which were not recovered. No follow-up due to age.  Patient says his current symptoms had onset about 6 to 8 weeks ago with significant constipation which is new for him.  He is going as long as 5 days without bowel movements.  He had taken a bottle of mag citrate without any significant results.  He is currently taking a combination of 4 tablespoons of milk of magnesia and a glass of MiraLAX daily and says he was able to pass small amounts of stool but is not passing any significant amount of stool.  He has not noted any melena or hematochezia.  He has no complaints of abdominal pain and no abdominal distention.  No rectal pain.  He has been eating okay though not as much as usual and has not had any nausea or vomiting. Labs were done April 2022 with normal CBC and be met CT of the abdomen and pelvis 02/27/2021 shows bilateral nonobstructive kidney stones, there is wall thickening near the rectosigmoid junction probably due to under distention, further evaluation to exclude malignancy recommended.  He has an enlarged prostate deforming the bladder base.   Review of Systems Pertinent positive and negative review of systems were noted in the above HPI section.  All other review of  systems was otherwise negative.  Outpatient Encounter Medications as of 03/18/2021  Medication Sig  . atorvastatin (LIPITOR) 80 MG tablet Take 1 tablet (80 mg total) by mouth daily. (Patient taking differently: Take 80 mg by mouth at bedtime.)  . CLARITIN 10 MG CAPS Take 1 capsule by mouth daily.  . [employer] ACETAMINOPHEN 325 MG CAPS Take 1 capsule by mouth 2 (two) times daily.   Marland Kitchen diltiazem (CARDIZEM) 30 MG tablet Take 1 tablet every 4 hours AS NEEDED for afib heart rate over 100  . docusate sodium (COLACE) 100 MG capsule Take 1 capsule (100 mg total) by mouth 2 (two) times daily.  . finasteride (PROSCAR) 5 MG tablet Take 5 mg daily by mouth.  . furosemide (LASIX) 20 MG tablet Take 1 tab daily as needed for fluid retention.  Marland Kitchen HYDROcodone-homatropine (HYCODAN) 5-1.5 MG/5ML syrup Take 5 mLs by mouth every 4 (four) hours as needed for cough.  . latanoprost (XALATAN) 0.005 % ophthalmic solution Place 1 drop into both eyes at bedtime.   Marland Kitchen lisinopril (PRINIVIL,ZESTRIL) 10 MG tablet Take 10 mg by mouth at bedtime.   . magnesium hydroxide (MILK OF MAGNESIA) 400 MG/5ML suspension Take 30 mLs by mouth daily as needed for mild constipation.  . metoprolol tartrate (LOPRESSOR) 12.5 mg TABS tablet Take 12.5 mg by mouth 2 (two) times daily.  . mometasone (NASONEX) 50 MCG/ACT nasal spray Place 2 sprays into both nostrils daily as  needed (allergies).  . Multiple Vitamins-Minerals (CENTRUM SILVER PO) Take 1 tablet by mouth every morning.  . nitroGLYCERIN (NITROSTAT) 0.4 MG SL tablet Place 1 tablet (0.4 mg total) under the tongue every 5 (five) minutes as needed for chest pain.  . pantoprazole (PROTONIX) 40 MG tablet Take 1 tablet (40 mg total) by mouth daily.  . polyethylene glycol powder (GLYCOLAX/MIRALAX) 17 GM/SCOOP powder Take 17 g by mouth daily.  . potassium chloride (KLOR-CON) 10 MEQ CR tablet Take 10 mEq by mouth every morning.  . tamsulosin (FLOMAX) 0.4 MG CAPS Take 0.4 mg by mouth 2 (two) times daily.   .  TOLAK 4 % CREA Apply 1 application topically daily as needed.  . warfarin (COUMADIN) 5 MG tablet Take 1 to 2 tablets daily as directed by the coumadin clinic  . [DISCONTINUED] cetirizine (ZYRTEC) 10 MG tablet Take 10 mg by mouth every morning.  . [DISCONTINUED] ciprofloxacin (CIPRO) 500 MG tablet Take 1 tablet (500 mg total) by mouth 2 (two) times daily.   No facility-administered encounter medications on file as of 03/18/2021.   Allergies  Allergen Reactions  . Sulfa Antibiotics Other (See Comments)    Severe peeling of the skin on the groin area  . Sulfamethoxazole Other (See Comments)    REACTION: rash   Patient Active Problem List   Diagnosis Date Noted  . Constipation 02/06/2021  . Coronary artery disease involving native coronary artery of native heart without angina pectoris 10/15/2019  . Educated about COVID-19 virus infection 10/15/2019  . Near syncope 06/10/2017  . Chest pain with moderate risk for cardiac etiology 06/10/2017  . CAD S/P percutaneous coronary angioplasty   . Abnormal stress test   . Paroxysmal atrial fibrillation ([place]) 11/12/2014  . Encounter for therapeutic drug monitoring 12/03/2013  . PVC's (premature ventricular contractions) 01/26/2012  . Tachycardia-bradycardia syndrome ([place]) 01/26/2012  . Long term (current) use of anticoagulants 09/06/2011  . GERD 08/01/2007  . History of colonic polyps 08/01/2007  . Personal history of urinary calculi 08/01/2007  . BPH with urinary obstruction 08/01/2007  . Hyperlipidemia 07/04/2007  . Essential hypertension 07/04/2007  . ALLERGIC RHINITIS 07/04/2007   Social History   Socioeconomic History  . Marital status: Widowed    Spouse name: Not on file  . Number of children: 2  . Years of education: Not on file  . Highest education level: Not on file  Occupational History  . Occupation: Retired  Tobacco Use  . Smoking status: Never Smoker  . Smokeless tobacco: Never Used  Vaping Use  . Vaping Use: Never used   Substance and Sexual Activity  . Alcohol use: No    Alcohol/week: 0.0 standard drinks  . Drug use: No  . Sexual activity: Not Currently  Other Topics Concern  . Not on file  Social History Narrative  . Not on file   Social Determinants of Health   Financial Resource Strain: Low Risk   . Difficulty of Paying Living Expenses: Not hard at all  Food Insecurity: No Food Insecurity  . Worried About Charity fundraiser in the Last Year: Never true  . Ran Out of Food in the Last Year: Never true  Transportation Needs: No Transportation Needs  . Lack of Transportation (Medical): No  . Lack of Transportation (Non-Medical): No  Physical Activity: Insufficiently Active  . Days of Exercise per Week: 3 days  . Minutes of Exercise per Session: 20 min  Stress: No Stress Concern Present  . Feeling of Stress :  Not at all  Social Connections: Moderately Isolated  . Frequency of Communication with Friends and Family: More than three times a week  . Frequency of Social Gatherings with Friends and Family: Three times a week  . Attends Religious Services: More than 4 times per year  . Active Member of Clubs or Organizations: No  . Attends Archivist Meetings: Never  . Marital Status: Widowed  Intimate Partner Violence: Not At Risk  . Fear of Current or Ex-Partner: No  . Emotionally Abused: No  . Physically Abused: No  . Sexually Abused: No    Mr. Sevey family history includes Heart attack (age of onset: 66) in his father; Heart disease in his father; Ovarian cancer in his mother.      Objective:    Vitals:   03/18/21 1336  BP: 124/68  Pulse: 64    Physical Exam Well-developed well-nourished elderly WM in no acute distress.  Height, Weight,189 BMI 26.3  HEENT; nontraumatic normocephalic, EOMI, PE R LA, sclera anicteric. Oropharynx;not done Neck; supple, no JVD Cardiovascular; regular rate and rhythm with S1-S2, no murmur rub or gallop Pulmonary; Clear  bilaterally Abdomen; soft, nondistended, somewhat doughy feeling, bowel sounds are hyperactive no focal tenderness, no palpable mass or hepatosplenomegaly,  Rectal; not done today patient reports done by Dr. Sarajane Jews and negative Skin; benign exam, no jaundice rash or appreciable lesions Extremities; no clubbing cyanosis or edema skin warm and dry Neuro/Psych; alert and oriented x4, grossly nonfocal mood and affect appropriate       Assessment & Plan:   #97 87 year old white male with abrupt change in bowel habits about 6 to 8 weeks ago with new onset constipation which has persisted with significant difficulty, despite 4 tablespoons of milk of magnesia daily and 17 g of MiraLAX in 8 ounces of water daily. CT of the abdomen pelvis done on 4 10/29/2021 showed some thickening in the junction of the rectosigmoid rule out secondary to under distention but cannot   rule out colonic lesion.  Patient has known severe diverticular disease, and prior history of adenomatous colon polyps with last colonoscopy August 2013  #2 chronic anticoagulation-on Coumadin #3 atrial fibrillation #4 coronary artery disease status post PTCA #5.  Hypertension #6.  GERD #7.  Hyperlipidemia  Plan; Patient will be scheduled for colonoscopy with Dr. [name].  Procedure was discussed in detail with the patient including indications risk and benefits and he is agreeable to proceed. He  will need to hold Coumadin for 5 days prior to colonoscopy.  We will communicate with his cardiologist Dr. Percival Spanish to assure this is reasonable for this patient.  Will check plain abdominal films today Patient will do a MiraLAX purge either tomorrow or Friday, then resume 4 tablespoons of milk of magnesia daily and 1 dose of MiraLAX daily.     [name] Matusek Genia Harold PA-C 03/18/2021   Cc: [name], MD

## 2021-03-18 NOTE — Telephone Encounter (Signed)
[facility] Pre-operative Risk Assessment     Request for surgical clearance:     Endoscopy Procedure  What type of surgery is being performed?     Colonoscopy  When is this surgery scheduled?     04/03/2021  What type of clearance is required ?   Pharmacy  Are there any medications that need to be held prior to surgery and how long? Coumadin starting 5 days prior may need a bridge  Practice name and name of physician performing surgery?      [facility]  What is your office phone and fax number?      Phone- [phone]  [phone]  Anesthesia type (None, local, MAC, general) ?       MAC

## 2021-03-18 NOTE — Patient Instructions (Signed)
If you are age 85 or older, your body mass index should be between 23-30. Your Body mass index is 26.36 kg/m. If this is out of the aforementioned range listed, please consider follow up with your Primary Care Provider.  Your provider has requested that you go to the basement level for a X-Ray before leaving today. Press "B" on the elevator. The imaging is located straight ahead as you exit the elevator.   Amy Esterwood, PA-C recommends that you complete a bowel purge (to clean out your bowels). Please do the following: Purchase a bottle of Miralax over the counter as well as a box of 5 mg dulcolax tablets. Take 4 dulcolax tablets. Wait 1 hour. You will then drink 6-8 capfuls of Miralax mixed in an adequate amount of water/juice/gatorade (you may choose which of these liquids to drink) over the next 2-3 hours. You should expect results within 1 to 6 hours after completing the bowel purge.  After you have completed your Miralax purge this week, go back to 4 tablespoons of Metamucil and 1 capful of Miralax daily  You will be contaced by our office prior to your procedure for directions on holding your Coumadin/Warfarin.  If you do not hear from our office 1 week prior to your scheduled procedure, please call [phone] to discuss.  Follow up pending the results of your Colonoscopy or as needed.  Thank you for entrusting me with your care and choosing [facility].  Amy Esterwood, PA-C

## 2021-03-19 ENCOUNTER — Telehealth: Payer: Self-pay | Admitting: *Deleted

## 2021-03-19 NOTE — Telephone Encounter (Signed)
Patient with diagnosis of atrial fibrillation on warfarin for anticoagulation.    Procedure: colonoscoppy Date of procedure: 04/03/2021   CHA2DS2-VASc Score = 4  This indicates a 4.8% annual risk of stroke. The patient's score is based upon: CHF History: No HTN History: Yes Diabetes History: No Stroke History: No Vascular Disease History: Yes Age Score: 2 Gender Score: 0   CrCl 55.7 Platelet count 195    Per office protocol, patient can hold warfain for 5 days prior to procedure.    Patient will not need bridging with Lovenox (enoxaparin) around procedure.

## 2021-03-19 NOTE — Telephone Encounter (Signed)
Pt called and LMOM stating that he has a colonoscopy scheduled for 5/27 and was was told to hold warfarin prior to procedure. Clearance note from 5/11- OKAY for pt to hold warfarin 5 days prior to procedure- no Lovenox needed.   Gave pt the following instructions:  -5/22-5/26: Hold warfarin -5/27: Evening of procedure- resume warfarin in the evening or as directed by doctor (take an extra half tablet with usual dose for 2 days then resume normal dose).  Pt verbalized understanding.   Pt normal dose of warfarin: 5mg  daily except for 7.5 mg on Mondays and Fridays.   Pt scheduled to come in office on 6/3 to have INR checked.

## 2021-03-19 NOTE — Telephone Encounter (Signed)
    KOLYN ROZARIO DOB:  06-11-1929  MRN:  [id number]   Primary Cardiologist: [name], MD  Chart reviewed as part of pre-operative protocol coverage. PHarmacy clearance request only received. Per pharmacy review and office protocols, Mr. ANDRAE CLAUNCH may hold Warfarin 5 days prior to the planned procedure. He will not need bridging with Lovenox.  I will route this recommendation to the requesting party via Epic fax function and remove from pre-op pool.  Please call with questions.  Loel Dubonnet, NP 03/19/2021, 9:27 AM

## 2021-03-24 NOTE — Telephone Encounter (Signed)
Called to speak with patient about holding his Warfarin. He has already been advised by the coumadin clinic about holding his warfarin starting 5 days prior to his Colonoscopy, with no need for a Lovenox bridge.

## 2021-04-02 DIAGNOSIS — H401132 Primary open-angle glaucoma, bilateral, moderate stage: Secondary | ICD-10-CM | POA: Diagnosis not present

## 2021-04-03 ENCOUNTER — Encounter: Payer: Self-pay | Admitting: Internal Medicine

## 2021-04-03 ENCOUNTER — Other Ambulatory Visit: Payer: Self-pay

## 2021-04-03 ENCOUNTER — Ambulatory Visit (AMBULATORY_SURGERY_CENTER): Payer: Medicare Other | Admitting: Internal Medicine

## 2021-04-03 VITALS — BP 132/67 | HR 61 | Temp 98.2°F | Resp 19 | Ht 71.0 in | Wt 189.0 lb

## 2021-04-03 DIAGNOSIS — K648 Other hemorrhoids: Secondary | ICD-10-CM | POA: Diagnosis not present

## 2021-04-03 DIAGNOSIS — K5730 Diverticulosis of large intestine without perforation or abscess without bleeding: Secondary | ICD-10-CM | POA: Diagnosis not present

## 2021-04-03 DIAGNOSIS — D123 Benign neoplasm of transverse colon: Secondary | ICD-10-CM

## 2021-04-03 DIAGNOSIS — D120 Benign neoplasm of cecum: Secondary | ICD-10-CM | POA: Diagnosis not present

## 2021-04-03 DIAGNOSIS — R933 Abnormal findings on diagnostic imaging of other parts of digestive tract: Secondary | ICD-10-CM | POA: Diagnosis not present

## 2021-04-03 DIAGNOSIS — D124 Benign neoplasm of descending colon: Secondary | ICD-10-CM | POA: Diagnosis not present

## 2021-04-03 DIAGNOSIS — D122 Benign neoplasm of ascending colon: Secondary | ICD-10-CM | POA: Diagnosis not present

## 2021-04-03 DIAGNOSIS — K5900 Constipation, unspecified: Secondary | ICD-10-CM

## 2021-04-03 DIAGNOSIS — R194 Change in bowel habit: Secondary | ICD-10-CM

## 2021-04-03 MED ORDER — SODIUM CHLORIDE 0.9 % IV SOLN: 500.0000 mL | Freq: Once | INTRAVENOUS | Status: DC

## 2021-04-03 NOTE — Progress Notes (Signed)
pt tolerated well. VSS. awake and to recovery. Report given to RN.  

## 2021-04-03 NOTE — Patient Instructions (Addendum)
I found and removed 6 tiny benign-appearing polyps and saw diverticulosis. Your hemorrhoids are slightly enlarged.   No cancer.  I will let you know results but do not expect any bad news.  Please continue your bowel regimen as you are  Resume warfarin tonight - extra 1/2 tablet tonight and tomorrow night then usual dose and schedule.  Have INR checked 6/3.  I appreciate the opportunity to care for you. Gatha Mayer, MD, [facility]   Handouts given for polyps, diverticulosis and hemorrhoids.  YOU HAD AN ENDOSCOPIC PROCEDURE TODAY AT [facility]:   Refer to the procedure report that was given to you for any specific questions about what was found during the examination.  If the procedure report does not answer your questions, please call your gastroenterologist to clarify.  If you requested that your care partner not be given the details of your procedure findings, then the procedure report has been included in a sealed envelope for you to review at your convenience later.  YOU SHOULD EXPECT: Some feelings of bloating in the abdomen. Passage of more gas than usual.  Walking can help get rid of the air that was put into your GI tract during the procedure and reduce the bloating. If you had a lower endoscopy (such as a colonoscopy or flexible sigmoidoscopy) you may notice spotting of blood in your stool or on the toilet paper. If you underwent a bowel prep for your procedure, you may not have a normal bowel movement for a few days.  Please Note:  You might notice some irritation and congestion in your nose or some drainage.  This is from the oxygen used during your procedure.  There is no need for concern and it should clear up in a day or so.  SYMPTOMS TO REPORT IMMEDIATELY:   Following lower endoscopy (colonoscopy or flexible sigmoidoscopy):  Excessive amounts of blood in the stool  Significant tenderness or worsening of abdominal pains  Swelling of the abdomen that is  new, acute  Fever of 100F or higher  For urgent or emergent issues, a gastroenterologist can be reached at any hour by calling [phone]. Do not use MyChart messaging for urgent concerns.    DIET:  We do recommend a small meal at first, but then you may proceed to your regular diet.  Drink plenty of fluids but you should avoid alcoholic beverages for 24 hours.  ACTIVITY:  You should plan to take it easy for the rest of today and you should NOT DRIVE or use heavy machinery until tomorrow (because of the sedation medicines used during the test).    FOLLOW UP: Our staff will call the number listed on your records 48-72 hours following your procedure to check on you and address any questions or concerns that you may have regarding the information given to you following your procedure. If we do not reach you, we will leave a message.  We will attempt to reach you two times.  During this call, we will ask if you have developed any symptoms of COVID 19. If you develop any symptoms (ie: fever, flu-like symptoms, shortness of breath, cough etc.) before then, please call [phone].  If you test positive for Covid 19 in the 2 weeks post procedure, please call and report this information to [place].    If any biopsies were taken you will be contacted by phone or by letter within the next 1-3 weeks.  Please call us at [phone] if  you have not heard about the biopsies in 3 weeks.    SIGNATURES/CONFIDENTIALITY: You and/or your care partner have signed paperwork which will be entered into your electronic medical record.  These signatures attest to the fact that that the information above on your After Visit Summary has been reviewed and is understood.  Full responsibility of the confidentiality of this discharge information lies with you and/or your care-partner.

## 2021-04-03 NOTE — Progress Notes (Signed)
Called to room to assist during endoscopic procedure.  Patient ID and intended procedure confirmed with present staff. Received instructions for my participation in the procedure from the performing physician.  

## 2021-04-03 NOTE — Op Note (Signed)
[place] Patient Name: [name] [name] Procedure Date: 04/03/2021 7:38 AM MRN: [id number] Endoscopist: Gatha Mayer , MD Age: 87 Referring MD:  Date of Birth: 03/18/1929 Gender: Male Account #: [id number] Procedure:                Colonoscopy Indications:              Abnormal CT of the GI tract Medicines:                Propofol per Anesthesia, Monitored Anesthesia Care Procedure:                Pre-Anesthesia Assessment:                           - Prior to the procedure, a History and Physical                            was performed, and patient medications and                            allergies were reviewed. The patient's tolerance of                            previous anesthesia was also reviewed. The risks                            and benefits of the procedure and the sedation                            options and risks were discussed with the patient.                            All questions were answered, and informed consent                            was obtained. Prior Anticoagulants: The patient                            last took Coumadin (warfarin) 5 days prior to the                            procedure. ASA Grade Assessment: II - A patient                            with mild systemic disease. After reviewing the                            risks and benefits, the patient was deemed in                            satisfactory condition to undergo the procedure.                           After obtaining informed consent, the colonoscope  was passed under direct vision. Throughout the                            procedure, the patient's blood pressure, pulse, and                            oxygen saturations were monitored continuously. The                            Colonoscope was introduced through the anus and                            advanced to the the cecum, identified by                            appendiceal orifice  and ileocecal valve. The                            colonoscopy was performed without difficulty. The                            patient tolerated the procedure well. The quality                            of the bowel preparation was good. The ileocecal                            valve, appendiceal orifice, and rectum were                            photographed. Scope In: 7:46:06 AM Scope Out: 8:04:56 AM Scope Withdrawal Time: 0 hours 13 minutes 14 seconds  Total Procedure Duration: 0 hours 18 minutes 50 seconds  Findings:                 The perianal and digital rectal examinations were                            normal. Pertinent negatives include normal prostate                            (size, shape, and consistency).                           Six sessile polyps were found in the descending                            colon, transverse colon, ascending colon and cecum.                            The polyps were diminutive in size. These polyps                            were removed with a cold snare. Resection and  retrieval were complete. Verification of patient                            identification for the specimen was done. Estimated                            blood loss was minimal.                           Many small and large-mouthed diverticula were found                            in the sigmoid colon, descending colon, transverse                            colon and ascending colon.                           External and internal hemorrhoids were found.                           The exam was otherwise without abnormality on                            direct and retroflexion views. Complications:            No immediate complications. Estimated Blood Loss:     Estimated blood loss was minimal. Impression:               - Six diminutive polyps in the descending colon, in                            the transverse colon, in the ascending colon  and in                            the cecum, removed with a cold snare. Resected and                            retrieved.                           - Severe diverticulosis in the sigmoid colon, in                            the descending colon, in the transverse colon and                            in the ascending colon.                           - External and internal hemorrhoids.                           - The examination was otherwise normal on direct  and retroflexion views. Recommendation:           - Patient has a contact number available for                            emergencies. The signs and symptoms of potential                            delayed complications were discussed with the                            patient. Return to normal activities tomorrow.                            Written discharge instructions were provided to the                            patient.                           - Resume previous diet.                           - Resume Coumadin (warfarin) at prior dose today.                           - Await pathology results.                           - No repeat colonoscopy due to age.                           - Continue current bowel regimen Gatha Mayer, MD 04/03/2021 8:15:56 AM This report has been signed electronically.

## 2021-04-07 ENCOUNTER — Telehealth: Payer: Self-pay | Admitting: *Deleted

## 2021-04-07 NOTE — Telephone Encounter (Signed)
  Follow up Call-  Call back number 04/03/2021  Post procedure Call Back phone  # [phone]  Permission to leave phone message Yes  Some recent data might be hidden     Patient questions:  Do you have a fever, pain , or abdominal swelling? No. Pain Score  0 *  Have you tolerated food without any problems? Yes.    Have you been able to return to your normal activities? Yes.    Do you have any questions about your discharge instructions: Diet   No. Medications  No. Follow up visit  No.  Do you have questions or concerns about your Care? No.  Actions: * If pain score is 4 or above: No action needed, pain <4.  1. Have you developed a fever since your procedure? no  2.   Have you had an respiratory symptoms (SOB or cough) since your procedure? no  3.   Have you tested positive for COVID 19 since your procedure no  4.   Have you had any family members/close contacts diagnosed with the COVID 19 since your procedure?  no   If yes to any of these questions please route to Joylene John, RN and Joella Prince, RN

## 2021-04-10 ENCOUNTER — Telehealth: Payer: Self-pay | Admitting: Pharmacist

## 2021-04-10 ENCOUNTER — Other Ambulatory Visit: Payer: Self-pay

## 2021-04-10 ENCOUNTER — Ambulatory Visit (INDEPENDENT_AMBULATORY_CARE_PROVIDER_SITE_OTHER): Payer: Medicare Other | Admitting: Pharmacist

## 2021-04-10 DIAGNOSIS — Z5181 Encounter for therapeutic drug level monitoring: Secondary | ICD-10-CM

## 2021-04-10 DIAGNOSIS — I480 Paroxysmal atrial fibrillation: Secondary | ICD-10-CM

## 2021-04-10 LAB — POCT INR: INR: 1.8 — AB (ref 2.0–3.0)

## 2021-04-10 NOTE — Patient Instructions (Signed)
Description   Take 2 tablets today, then continue taking Warfarin 1 tablet daily except for 1.5 tablets on Mondays and Fridays.  Recheck INR in 3 weeks. Call with any questions or concerns [phone]

## 2021-04-10 NOTE — Chronic Care Management (AMB) (Signed)
Date- Patient called to remind of appointment with Watt Climes on 06.06.2022 at 12:00 pm  Patient aware of appointment date, time, and type of appointment ( telephone ). Patient aware to have/bring all medications, supplements, blood pressure and/or blood sugar logs to visit.  Questions: Have you had any recent office visit or specialist visit outside of [facility]? Yes, he seen ophthalmology Are there any concerns you would like to discuss during your office visit? None Are you having any problems obtaining your medications? No If patient has any PAP medications ask if they are having any problems getting their PAP medication or refill?  Star Rating Drug: None  Any gaps in medications fill history?  Maia Breslow, [place] Pharmacist Assistant [phone]

## 2021-04-13 ENCOUNTER — Ambulatory Visit (INDEPENDENT_AMBULATORY_CARE_PROVIDER_SITE_OTHER): Payer: Medicare Other | Admitting: Pharmacist

## 2021-04-13 DIAGNOSIS — N138 Other obstructive and reflux uropathy: Secondary | ICD-10-CM | POA: Diagnosis not present

## 2021-04-13 DIAGNOSIS — I10 Essential (primary) hypertension: Secondary | ICD-10-CM

## 2021-04-13 DIAGNOSIS — N401 Enlarged prostate with lower urinary tract symptoms: Secondary | ICD-10-CM | POA: Diagnosis not present

## 2021-04-13 NOTE — Progress Notes (Signed)
Chronic Care Management Pharmacy Note  04/13/2021 Name:  [name] [name] MRN:  [id number] DOB:  December 05, 1928  Summary: -BP at goal <140/90 per home readings -Constipation has mostly improved with GI cocktail  Recommendations/Changes made from today's visit: -Recommended trial of probiotic to help regulate digestive health -Recommended continued physical activity/exercise to help with constipation  Plan: -Follow up in 6 months   Subjective: [name] [name] is an 87 y.o. year old male who is a primary patient of [name], MD.  The CCM team was consulted for assistance with disease management and care coordination needs.    Engaged with patient by telephone for follow up visit in response to provider referral for pharmacy case management and/or care coordination services.   Consent to Services:  The patient was given information about Chronic Care Management services, agreed to services, and gave verbal consent prior to initiation of services.  Please see initial visit note for detailed documentation.   Patient Care Team: [name], MD as PCP - Cheral Bay, MD as PCP - Cardiology (Cardiology) Viona Gilmore, [facility] as Pharmacist (Pharmacist)  Recent office visits: 02/24/21 [name], MD: Patient presented for constipation. Recommended using 1 bottle of magnesium citrate tonight. Plan for CT of abdomen and pelvis.  02/24/21 [name], MD: Patient presented for constipation. Recommended adding milk of magnesia on to regimen of Miralax.  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 Placed order for nerve conduction studies.  11/25/20 Ofilia Neas, LPN: Patient presented for AWV.  Recent consult visits: 04/10/21 Fuller Canada, [facility] (cardiology): Patient presented for anti-coag visit. INR 1.8, goal 2-3. Boosted dose x 1 day. Continued 7.5 mg (5 mg x 1.5) every Mon, Fri; 5 mg (5 mg x 1) all other days.  04/03/21 Patient presented  for colonoscopy.  04/02/21 [name] (ophthalmology): Patient presented for glaucoma evaluation.   03/18/21 Amy Easterwood, PA-C (gastro): Patient presented for constipation follow up. Plan for colonoscopy.  03/02/21 [name], MD (cardiology): Patient presented for Afib follow up.   12/30/20 [name] (ophthalmology): Patient presented for glaucoma evaluation.   12/08/20 Festus Aloe, MD (urology): Unable to access notes.   Hospital visits: None in previous 6 months   Objective:  Lab Results  Component Value Date   CREATININE 1.09 02/24/2021   BUN 20 02/24/2021   GFR 60.11 02/24/2021   GFRNONAA 61 09/15/2020   GFRAA 71 09/15/2020   NA 139 02/24/2021   K 4.4 02/24/2021   CALCIUM 9.4 02/24/2021   CO2 31 02/24/2021   GLUCOSE 94 02/24/2021    Lab Results  Component Value Date/Time   HGBA1C 6.2 (H) 09/15/2020 09:34 AM   GFR 60.11 02/24/2021 04:25 PM   GFR 64.50 09/05/2019 10:12 AM    Last diabetic Eye exam: No results found for: HMDIABEYEEXA  Last diabetic Foot exam: No results found for: HMDIABFOOTEX   Lab Results  Component Value Date   CHOL 143 09/15/2020   HDL 47 09/15/2020   LDLCALC 74 09/15/2020   TRIG 135 09/15/2020   CHOLHDL 3.0 09/15/2020    Hepatic Function Latest Ref Rng & Units 09/15/2020 09/05/2019 08/29/2018  Total Protein 6.1 - 8.1 g/dL 6.9 6.5 6.4  Albumin 3.5 - 5.2 g/dL - 4.3 4.3  AST 10 - 35 U/L 41(H) 30 29  ALT 9 - 46 U/L 34 30 29  Alk Phosphatase 39 - 117 U/L - 55 54  Total Bilirubin 0.2 - 1.2 mg/dL 0.8 0.7  0.7  Bilirubin, Direct 0.0 - 0.2 mg/dL 0.2 0.1 0.1    Lab Results  Component Value Date/Time   TSH 1.93 09/15/2020 09:34 AM   TSH 1.68 09/05/2019 10:12 AM    CBC Latest Ref Rng & Units 02/24/2021 09/15/2020 09/05/2019  WBC 4.0 - 10.5 K/uL 8.6 10.9(H) 11.4(H)  Hemoglobin 13.0 - 17.0 g/dL 13.8 14.1 12.9(L)  Hematocrit 39.0 - 52.0 % 40.4 42.1 38.7(L)  Platelets 150.0 - 400.0 K/uL 195.0 227 179.0    Lab Results   Component Value Date/Time   VD25OH 37 03/13/2014 10:26 AM    Clinical ASCVD: Yes  The ASCVD Risk score Mikey Bussing DC Jr., et al., 2013) failed to calculate for the following reasons:   The 2013 ASCVD risk score is only valid for ages 48 to 65    Depression screen PHQ 2/9 11/25/2020 09/15/2020 08/29/2018  Decreased Interest 0 0 0  Down, Depressed, Hopeless 0 0 0  PHQ - 2 Score 0 0 0  Altered sleeping 0 - -  Tired, decreased energy 0 - -  Change in appetite 0 - -  Feeling bad or failure about yourself  0 - -  Trouble concentrating 0 - -  Moving slowly or fidgety/restless 0 - -  Suicidal thoughts 0 - -  PHQ-9 Score 0 - -  Difficult doing work/chores Not difficult at all - -  Some recent data might be hidden     CHA2DS2/VAS Stroke Risk Points  Current as of 2 hours ago     4 >= 2 Points: High Risk  1 - 1.99 Points: Medium Risk  0 Points: Low Risk    Last Change: N/A      Details    This score determines the patient's risk of having a stroke if the  patient has atrial fibrillation.       Points Metrics  0 Has Congestive Heart Failure:  No    Current as of 2 hours ago  1 Has Vascular Disease:  Yes    Current as of 2 hours ago  1 Has Hypertension:  Yes    Current as of 2 hours ago  2 Age:  8    Current as of 2 hours ago  0 Has Diabetes:  No    Current as of 2 hours ago  0 Had Stroke:  No  Had TIA:  No  Had Thromboembolism:  No    Current as of 2 hours ago  0 Male:  No    Current as of 2 hours ago      Social History   Tobacco Use  Smoking Status Never Smoker  Smokeless Tobacco Never Used   BP Readings from Last 3 Encounters:  04/03/21 132/67  03/18/21 124/68  03/02/21 (!) 142/72   Pulse Readings from Last 3 Encounters:  04/03/21 61  03/18/21 64  02/24/21 82   Wt Readings from Last 3 Encounters:  04/03/21 189 lb (85.7 kg)  03/18/21 189 lb (85.7 kg)  03/02/21 188 lb 12.8 oz (85.6 kg)   BMI Readings from Last 3 Encounters:  04/03/21 26.36 kg/m   03/18/21 26.36 kg/m  03/02/21 26.33 kg/m    Assessment/Interventions: Review of patient past medical history, allergies, medications, health status, including review of consultants reports, laboratory and other test data, was performed as part of comprehensive evaluation and provision of chronic care management services.   SDOH:  (Social Determinants of Health) assessments and interventions performed: No  SDOH Screenings   Alcohol Screen: Low  Risk   . Last Alcohol Screening Score (AUDIT): 0  Depression (PHQ2-9): Low Risk   . PHQ-2 Score: 0  Financial Resource Strain: Low Risk   . Difficulty of Paying Living Expenses: Not hard at all  Food Insecurity: No Food Insecurity  . Worried About Charity fundraiser in the Last Year: Never true  . Ran Out of Food in the Last Year: Never true  Housing: Low Risk   . Last Housing Risk Score: 0  Physical Activity: Insufficiently Active  . Days of Exercise per Week: 3 days  . Minutes of Exercise per Session: 20 min  Social Connections: Moderately Isolated  . Frequency of Communication with Friends and Family: More than three times a week  . Frequency of Social Gatherings with Friends and Family: Three times a week  . Attends Religious Services: More than 4 times per year  . Active Member of Clubs or Organizations: No  . Attends Archivist Meetings: Never  . Marital Status: Widowed  Stress: No Stress Concern Present  . Feeling of Stress : Not at all  Tobacco Use: Low Risk   . Smoking Tobacco Use: Never Smoker  . Smokeless Tobacco Use: Never Used  Transportation Needs: No Transportation Needs  . Lack of Transportation (Medical): No  . Lack of Transportation (Non-Medical): No    CCM Care Plan  Allergies  Allergen Reactions  . Sulfa Antibiotics Other (See Comments)    Severe peeling of the skin on the groin area  . Sulfamethoxazole Other (See Comments)    REACTION: rash    Medications Reviewed Today    Reviewed by Judee Clara, CRNA (Certified Registered Nurse Anesthetist) on 04/03/21 at [phone]  Med List Status: <None>  Medication Order Taking? Sig Documenting Provider Last Dose Status Informant  0.9 %  sodium chloride infusion [id number]   Gatha Mayer, MD  Active   atorvastatin (LIPITOR) 80 MG tablet [id number] Yes Take 1 tablet (80 mg total) by mouth daily.  Patient taking differently: Take 80 mg by mouth at bedtime.   Eileen Stanford, PA-C 04/02/2021 Active   CLARITIN 10 MG CAPS [id number] Yes Take 1 capsule by mouth daily. [provider] 04/02/2021 Active   [facility] ACETAMINOPHEN 325 MG CAPS [id number] Yes Take 1 capsule by mouth 2 (two) times daily.  [provider] 04/02/2021 Active   diltiazem (CARDIZEM) 30 MG tablet [id number] No Take 1 tablet every 4 hours AS NEEDED for afib heart rate over 100  Patient not taking: Reported on 04/03/2021   Sherran Needs, NP Not Taking Active Self  docusate sodium (COLACE) 100 MG capsule [id number] Yes Take 1 capsule (100 mg total) by mouth 2 (two) times daily. [name], MD 04/02/2021 Active   finasteride (PROSCAR) 5 MG tablet [id number] Yes Take 5 mg daily by mouth. [provider] 04/02/2021 Active Self  furosemide (LASIX) 20 MG tablet [id number] No Take 1 tab daily as needed for fluid retention.  Patient not taking: Reported on 04/03/2021   [name], MD Not Taking Active   HYDROcodone-homatropine [facility]) 5-1.5 MG/5ML syrup [id number] No Take 5 mLs by mouth every 4 (four) hours as needed for cough.  Patient not taking: Reported on 04/03/2021   [name], MD Not Taking Active   latanoprost (XALATAN) 0.005 % ophthalmic solution [id number] Yes Place 1 drop into both eyes at bedtime.  [provider] 04/02/2021 Active   lisinopril (PRINIVIL,ZESTRIL) 10 MG tablet [id number] Yes Take 10 mg  by mouth at bedtime.  [provider] 04/02/2021 Active Self  magnesium hydroxide (MILK OF MAGNESIA) 400 MG/5ML suspension [id number] No  Take 30 mLs by mouth daily as needed for mild constipation.  Patient not taking: Reported on 04/03/2021   [provider] Not Taking Active   metoprolol tartrate (LOPRESSOR) 12.5 mg TABS tablet [id number] Yes Take 12.5 mg by mouth 2 (two) times daily. [provider] 04/02/2021 Active   mometasone (NASONEX) 50 MCG/ACT nasal spray [id number] Yes Place 2 sprays into both nostrils daily as needed (allergies). [provider] 04/02/2021 Active Self  Multiple Vitamins-Minerals (CENTRUM SILVER PO) [id number] No Take 1 tablet by mouth every morning.  Patient not taking: Reported on 04/03/2021   [provider] Not Taking Active Self  nitroGLYCERIN (NITROSTAT) 0.4 MG SL tablet [id number] No Place 1 tablet (0.4 mg total) under the tongue every 5 (five) minutes as needed for chest pain.  Patient not taking: Reported on 04/03/2021   Eileen Stanford, PA-C Not Taking Active   pantoprazole (PROTONIX) 40 MG tablet [id number] Yes Take 1 tablet (40 mg total) by mouth daily. Eileen Stanford, PA-C 04/02/2021 Active Self  polyethylene glycol powder (GLYCOLAX/MIRALAX) 17 GM/SCOOP powder [id number] No Take 17 g by mouth daily.  Patient not taking: Reported on 04/03/2021   [name], MD Not Taking Active   potassium chloride (KLOR-CON) 10 MEQ CR tablet [id number] Yes Take 10 mEq by mouth every morning. [provider] Past Week Active Self  tamsulosin (FLOMAX) 0.4 MG CAPS [id number] Yes Take 0.4 mg by mouth 2 (two) times daily.  [provider] 04/02/2021 Active Self  TOLAK 4 % CREA [id number] Yes Apply 1 application topically daily as needed. [provider] 04/02/2021 Active   warfarin (COUMADIN) 5 MG tablet [id number] No Take 1 to 2 tablets daily as directed by the coumadin clinic [name], MD 03/28/2021 Active           Patient Active Problem List   Diagnosis Date Noted  . Constipation 02/06/2021  . Coronary artery disease involving native coronary  artery of native heart without angina pectoris 10/15/2019  . Educated about COVID-19 virus infection 10/15/2019  . Near syncope 06/10/2017  . Chest pain with moderate risk for cardiac etiology 06/10/2017  . CAD S/P percutaneous coronary angioplasty   . Abnormal stress test   . Paroxysmal atrial fibrillation ([place]) 11/12/2014  . Encounter for therapeutic drug monitoring 12/03/2013  . PVC's (premature ventricular contractions) 01/26/2012  . Tachycardia-bradycardia syndrome ([place]) 01/26/2012  . Long term (current) use of anticoagulants 09/06/2011  . GERD 08/01/2007  . History of colonic polyps 08/01/2007  . Personal history of urinary calculi 08/01/2007  . BPH with urinary obstruction 08/01/2007  . Hyperlipidemia 07/04/2007  . Essential hypertension 07/04/2007  . ALLERGIC RHINITIS 07/04/2007    Immunization History  Administered Date(s) Administered  . Fluad Quad(high Dose 65+) 07/27/2019  . Influenza Split 08/19/2011, 08/21/2012  . Influenza Whole 08/14/2007, 08/01/2008, 08/11/2009, 08/17/2010  . Influenza, High Dose Seasonal PF 08/25/2015, 08/25/2016, 07/29/2017, 07/25/2018, 09/02/2020  . Influenza,inj,Quad PF,6+ Mos 08/22/2013, 08/23/2014  . Moderna Sars-Covid-2 Vaccination 11/20/2019, 12/18/2019, 09/07/2020  . Pneumococcal Conjugate-13 03/07/2014  . Pneumococcal Polysaccharide-23 11/08/1998  . Td 06/08/2001, 07/16/2008  . Tdap 08/25/2016  . Zoster, Live 08/24/2011    Conditions to be addressed/monitored:  Hypertension, Hyperlipidemia, Atrial Fibrillation, Coronary Artery Disease, GERD, BPH, Allergic Rhinitis and Glaucoma  Care Plan : [place]  Updates made by Viona Gilmore, [facility] since 04/13/2021  12:00 AM    Problem: Problem: Hypertension, Hyperlipidemia, Atrial Fibrillation, Coronary Artery Disease, GERD, BPH, Allergic Rhinitis and Glaucoma     Long-Range Goal: Patient-Specific Goal   Start Date: 04/13/2021  Expected End Date: 04/13/2022  This Visit's Progress:  On track  Priority: High  Note:   Current Barriers:  . Unable to independently monitor therapeutic efficacy  Pharmacist Clinical Goal(s):  Marland Kitchen Patient will achieve adherence to monitoring guidelines and medication adherence to achieve therapeutic efficacy through collaboration with PharmD and provider.   Interventions: . 1:1 collaboration with [name], MD regarding development and update of comprehensive plan of care as evidenced by provider attestation and co-signature . Inter-disciplinary care team collaboration (see longitudinal plan of care) . Comprehensive medication review performed; medication list updated in electronic medical record  Hypertension (BP goal <140/90) -Controlled -Current treatment:  Lisinopril 20 mg 1/2 tablet daily  Metoprolol tartrate 12.5 mg 1 tablet twice daily -Medications previously tried: none  -Current home readings: 134/67, pulse 53, 130/80 62, 124/62 HR 52 -Current dietary habits: no changes -Current exercise habits: slightly less active lately -Denies hypotensive/hypertensive symptoms -Educated on BP goals and benefits of medications for prevention of heart attack, stroke and kidney damage; Exercise goal of 150 minutes per week; Importance of home blood pressure monitoring; Proper BP monitoring technique; -Counseled to monitor BP at home weekly, document, and provide log at future appointments -Counseled on diet and exercise extensively Recommended to continue current medication  Hyperlipidemia: (LDL goal < 70) -Not ideally controlled -Current treatment: . Atorvastatin 80 mg 1 tablet daily at bedtime -Medications previously tried: none -Current dietary patterns: did not discuss -Current exercise habits: slightly less active lately -Educated on Cholesterol goals;  Benefits of statin for ASCVD risk reduction; Exercise goal of 150 minutes per week; -Counseled on diet and exercise extensively Recommended to continue current  medication  Atrial Fibrillation (Goal: prevent stroke and major bleeding) -Controlled -CHADSVASC: 3 -Current treatment: . Rate control:  Diltiazem 30 mg 1 tablet every 4 hours as needed for HR > 100 bpm, Metoprolol tartrate 12.5 mg 1 tablet twice daily . Anticoagulation:  Warfarin 5 mg tablet 7.5 mg on Mon and Fri and 5 mg on all other days -Medications previously tried: none -Home BP and HR readings: refer to above  -Counseled on importance of adherence to anticoagulant exactly as prescribed; avoidance of NSAIDs due to increased bleeding risk with anticoagulants; -Recommended to continue current medication  CAD (Goal: prevent heart events) -Controlled -Current treatment   Warfarin 5 mg tablet 7.5 mg on Mon and Fri and 5 mg on all other days  Nitroglycerin 0.4 mg SL tablet as needed  Atorvastatin 80 mg 1 tablet daily -Medications previously tried: none  -Recommended to continue current medication  Lower extremity edema (Goal: minimize swelling) -Controlled -Current treatment  . Furosemide 20 mg daily as needed -Medications previously tried: none  -Recommended to continue current medication  BPH (Goal: minimize symptoms) -Controlled -Current treatment   Tamsulosin 0.4 mg 1 capsule twice daily  Finasteride 5 mg 1 tablet daily  -Medications previously tried: none  -Recommended to continue current medication  GERD (Goal: minimize symptoms) -Controlled -Current treatment  . Pantoprazole 40 mg 1 tablet daily -Medications previously tried: none  -Recommended to continue current medication  Allergic rhinitis (Goal: minimize symptoms) -Controlled -Current treatment   Claritin 10 mg 1 tablet daily in the morning  Mometasone 50 mcg/act 2 sprays in both nostrils at bedtime -Medications previously tried: Zyrtec (duplication)  -Recommended to continue  current medication  Glaucoma (Goal: lower intraocular pressure) -Controlled -Current treatment  . Latanoprost 0.005% 1  drop in both eyes at bedtime -Medications previously tried: none  -Recommended to continue current medication   Health Maintenance -Vaccine gaps: Shingrix, COVID booster -Current therapy:   Acetaminophen 500 mg 1 capsule twice daily  Centrum silver 1 tablet daily  Potassium chloride 10 mEq 1 tablet every morning  Tolak 4% cream (prescribed by dermatologist, sees every 6 months - Dr. Pearline Cables) -Educated on Cost vs benefit of each product must be carefully weighed by individual consumer -Patient is satisfied with current therapy and denies issues -Recommended to continue current medication  Patient Goals/Self-Care Activities . Patient will:  - take medications as prescribed check blood pressure weekly, document, and provide at future appointments target a minimum of 150 minutes of moderate intensity exercise weekly  Follow Up Plan: Telephone follow up appointment with care management team member scheduled for: 6 months       Medication Assistance: None required.  Patient affirms current coverage meets needs.  Compliance/Adherence/Medication fill history: Care Gaps: Shingrix, COVID booster  Star-Rating Drugs: Atorvastatin 80 mg - unable to access dispense report (filled at [place]) Lisinopril 10 mg - unable to access dispense report (filled at [place])  Patient's preferred pharmacy is:  [facility], [address] [place] [address] Phone: [phone] Fax: [phone]  [facility] [address] [facility] [place], [place] - [address] [place] [address] Phone: [phone] Fax: [phone]  [facility] [place], [address] [place] [address] Phone: [phone] Fax: [phone]  Uses pill box? Yes - daily pill box for vacations; otherwise flips cup when he takes  (has 1 in the night and the morning) Pt endorses 100% compliance  We discussed: Current pharmacy is preferred with insurance plan and patient is satisfied with pharmacy services Patient decided to: Continue current medication management strategy  Care Plan and Follow Up Patient Decision:  Patient agrees to Care Plan and Follow-up.  Plan: Telephone follow up appointment with care management team member scheduled for:  6 months  Jeni Salles, PharmD [place] [facility] [place] [phone]  6 months - best time of day

## 2021-04-13 NOTE — Patient Instructions (Signed)
Hi [name] [name],  It was great to get to speak with you again! Below is a summary of some of the topics we discussed.   Please reach out to me if you have any questions or need anything before our follow up!  Best, [name] [name]  [name] [name], PharmD, [facility]  Visit Information  Goals Addressed   None    Patient Care Plan: CCM Pharmacy Care Plan    Problem Identified: Problem: Hypertension, Hyperlipidemia, Atrial Fibrillation, Coronary Artery Disease, GERD, BPH, Allergic Rhinitis and Glaucoma     Long-Range Goal: Patient-Specific Goal   Start Date: 04/13/2021  Expected End Date: 04/13/2022  This Visit's Progress: On track  Priority: High  Note:   Current Barriers:  . Unable to independently monitor therapeutic efficacy  Pharmacist Clinical Goal(s):  Marland Kitchen Patient will achieve adherence to monitoring guidelines and medication adherence to achieve therapeutic efficacy through collaboration with PharmD and provider.   Interventions: . 1:1 collaboration with [name], MD regarding development and update of comprehensive plan of care as evidenced by provider attestation and co-signature . Inter-disciplinary care team collaboration (see longitudinal plan of care) . Comprehensive medication review performed; medication list updated in electronic medical record  Hypertension (BP goal <140/90) -Controlled -Current treatment:  Lisinopril 20 mg 1/2 tablet daily  Metoprolol tartrate 12.5 mg 1 tablet twice daily -Medications previously tried: none  -Current home readings: 134/67, pulse 53, 130/80 62, 124/62 HR 52 -Current dietary habits: no changes -Current exercise habits: slightly less active lately -Denies hypotensive/hypertensive symptoms -Educated on BP goals and benefits of medications for prevention of heart attack, stroke and kidney damage; Exercise goal of 150 minutes per week; Importance of home blood pressure  monitoring; Proper BP monitoring technique; -Counseled to monitor BP at home weekly, document, and provide log at future appointments -Counseled on diet and exercise extensively Recommended to continue current medication  Hyperlipidemia: (LDL goal < 70) -Not ideally controlled -Current treatment: . Atorvastatin 80 mg 1 tablet daily at bedtime -Medications previously tried: none -Current dietary patterns: did not discuss -Current exercise habits: slightly less active lately -Educated on Cholesterol goals;  Benefits of statin for ASCVD risk reduction; Exercise goal of 150 minutes per week; -Counseled on diet and exercise extensively Recommended to continue current medication  Atrial Fibrillation (Goal: prevent stroke and major bleeding) -Controlled -CHADSVASC: 3 -Current treatment: . Rate control:  Diltiazem 30 mg 1 tablet every 4 hours as needed for HR > 100 bpm, Metoprolol tartrate 12.5 mg 1 tablet twice daily . Anticoagulation:  Warfarin 5 mg tablet 7.5 mg on Mon and Fri and 5 mg on all other days -Medications previously tried: none -Home BP and HR readings: refer to above  -Counseled on importance of adherence to anticoagulant exactly as prescribed; avoidance of NSAIDs due to increased bleeding risk with anticoagulants; -Recommended to continue current medication  CAD (Goal: prevent heart events) -Controlled -Current treatment   Warfarin 5 mg tablet 7.5 mg on Mon and Fri and 5 mg on all other days  Nitroglycerin 0.4 mg SL tablet as needed  Atorvastatin 80 mg 1 tablet daily -Medications previously tried: none  -Recommended to continue current medication  Lower extremity edema (Goal: minimize swelling) -Controlled -Current treatment  . Furosemide 20 mg daily as needed -Medications previously tried: none  -Recommended to continue current medication  BPH (Goal: minimize symptoms) -Controlled -Current treatment   Tamsulosin 0.4 mg 1 capsule twice  daily  Finasteride 5 mg 1 tablet daily  -  Medications previously tried: none  -Recommended to continue current medication  GERD (Goal: minimize symptoms) -Controlled -Current treatment  . Pantoprazole 40 mg 1 tablet daily -Medications previously tried: none  -Recommended to continue current medication  Allergic rhinitis (Goal: minimize symptoms) -Controlled -Current treatment   Claritin 10 mg 1 tablet daily in the morning  Mometasone 50 mcg/act 2 sprays in both nostrils at bedtime -Medications previously tried: Zyrtec (duplication)  -Recommended to continue current medication  Glaucoma (Goal: lower intraocular pressure) -Controlled -Current treatment  . Latanoprost 0.005% 1 drop in both eyes at bedtime -Medications previously tried: none  -Recommended to continue current medication   Health Maintenance -Vaccine gaps: Shingrix, COVID booster -Current therapy:   Acetaminophen 500 mg 1 capsule twice daily  Centrum silver 1 tablet daily  Potassium chloride 10 mEq 1 tablet every morning  Tolak 4% cream (prescribed by dermatologist, sees every 6 months - Dr. Pearline Cables) -Educated on Cost vs benefit of each product must be carefully weighed by individual consumer -Patient is satisfied with current therapy and denies issues -Recommended to continue current medication  Patient Goals/Self-Care Activities . Patient will:  - take medications as prescribed check blood pressure weekly, document, and provide at future appointments target a minimum of 150 minutes of moderate intensity exercise weekly  Follow Up Plan: Telephone follow up appointment with care management team member scheduled for: 6 months       Patient verbalizes understanding of instructions provided today and agrees to view in [place].  Telephone follow up appointment with pharmacy team member scheduled for: Dec 5th @ 12 pm  Viona Gilmore, [facility]

## 2021-04-21 ENCOUNTER — Encounter: Payer: Self-pay | Admitting: Family Medicine

## 2021-04-21 ENCOUNTER — Encounter: Payer: Self-pay | Admitting: Internal Medicine

## 2021-04-21 ENCOUNTER — Other Ambulatory Visit: Payer: Self-pay

## 2021-04-21 ENCOUNTER — Ambulatory Visit
Admission: EM | Admit: 2021-04-21 | Discharge: 2021-04-21 | Disposition: A | Discharge status: Home / Self Care | Payer: Medicare Other | Attending: Family Medicine | Admitting: Family Medicine

## 2021-04-21 ENCOUNTER — Ambulatory Visit (INDEPENDENT_AMBULATORY_CARE_PROVIDER_SITE_OTHER): Payer: Medicare Other

## 2021-04-21 ENCOUNTER — Telehealth (INDEPENDENT_AMBULATORY_CARE_PROVIDER_SITE_OTHER): Payer: Medicare Other | Admitting: Family Medicine

## 2021-04-21 DIAGNOSIS — R0981 Nasal congestion: Secondary | ICD-10-CM | POA: Diagnosis not present

## 2021-04-21 DIAGNOSIS — R509 Fever, unspecified: Secondary | ICD-10-CM

## 2021-04-21 DIAGNOSIS — U071 COVID-19: Secondary | ICD-10-CM

## 2021-04-21 DIAGNOSIS — R059 Cough, unspecified: Secondary | ICD-10-CM

## 2021-04-21 DIAGNOSIS — Z20822 Contact with and (suspected) exposure to covid-19: Secondary | ICD-10-CM | POA: Diagnosis not present

## 2021-04-21 MED ORDER — PAXLOVID 10 X 150 MG & 10 X 100MG PO TBPK: 2.0000 | ORAL_TABLET | Freq: Two times a day (BID) | ORAL | 0 refills | Status: AC

## 2021-04-21 MED ORDER — BENZONATATE 100 MG PO CAPS: 100.0000 mg | ORAL_CAPSULE | Freq: Three times a day (TID) | ORAL | 0 refills | Status: DC | PRN

## 2021-04-21 NOTE — Patient Instructions (Signed)
Seek evaluation at the urgent care this evening as we discussed.  Would advise requesting testing for COVID and flu and if positive, please request that they provide treatment as prompt treatment is needed with these diagnoses.  I did copy this note to your primary care doctor as requested.  -I sent the medication(s) we discussed to your pharmacy: Meds ordered this encounter  Medications   benzonatate (TESSALON PERLES) 100 MG capsule    Sig: Take 1 capsule (100 mg total) by mouth 3 (three) times daily as needed.    Dispense:  20 capsule    Refill:  0     I hope you are feeling better soon!  It was nice to meet you today. I help [facility] out with telemedicine visits on Tuesdays and Thursdays and am available for visits on those days. If you have any concerns or questions following this visit please schedule a follow up visit with your Primary Care doctor or seek care at a local urgent care clinic to avoid delays in care.

## 2021-04-21 NOTE — ED Provider Notes (Signed)
[facility]    CSN: [id number] Arrival date & time: 04/21/21  1854      History   Chief Complaint Chief Complaint  Patient presents with   Cough   Covid Positive    HPI [name] [name] is a 87 y.o. male.   HPI Patient presents with URI symptoms including cough, nasal congestion, and fever. Denies worrisome symptoms of shortness of breath, weakness, N&V, or chest pain. 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 He became symptomatic today. took a home COVID test which resulted as positive. High Risk for COVID complication due to cardiovascular disease, age, and hyperlipidemia. Past Medical History:  Diagnosis Date   Coronary artery disease    a. LHC on 04/06/16 which revealed 40% occl pRCA, 85% occl D1, 75% occl Ramus, 65% occl mLCx, 45% occl pLAD, 90% occl pLAD and nl LV function. s/p PCI/DES to prox LAD. Medical therapy for diag disease.     GERD (gastroesophageal reflux disease)    Glaucoma    Hemorrhoids    internal   HTN (hypertension)    Hx of colonic polyps    tubular adenoma    Hyperlipidemia    Kidney stone    Melanoma of cheek ([place])    Nephrolithiasis    Pancolonic diverticulosis    Paroxysmal atrial fibrillation ([place])    a. s/p ablation 12/2014: on coumadin    Patient Active Problem List   Diagnosis Date Noted   Constipation 02/06/2021   Coronary artery disease involving native coronary artery of native heart without angina pectoris 10/15/2019   Educated about COVID-19 virus infection 10/15/2019   Near syncope 06/10/2017   Chest pain with moderate risk for cardiac etiology 06/10/2017   CAD S/P percutaneous coronary angioplasty    Abnormal stress test    Paroxysmal atrial fibrillation ([place]) 11/12/2014   Encounter for therapeutic drug monitoring 12/03/2013   PVC's (premature ventricular contractions) 01/26/2012   Tachycardia-bradycardia syndrome ([place]) 01/26/2012   Long term  (current) use of anticoagulants 09/06/2011   GERD 08/01/2007   History of colonic polyps 08/01/2007   Personal history of urinary calculi 08/01/2007   BPH with urinary obstruction 08/01/2007   Hyperlipidemia 07/04/2007   Essential hypertension 07/04/2007   ALLERGIC RHINITIS 07/04/2007    Past Surgical History:  Procedure Laterality Date   ATRIAL FIBRILLATION ABLATION N/A 11/12/2014   Procedure: ATRIAL FIBRILLATION ABLATION;  Surgeon: [name], MD;  Location: [facility] CATH LAB;  Service: Cardiovascular;  Laterality: N/A;   BLADDER SURGERY     ruptured blood vessel in the bladder 3 weeks S/P ureter stones removed   CARDIAC CATHETERIZATION N/A 04/06/2016   Procedure: Left Heart Cath and Coronary Angiography;  Surgeon: [name], MD;  Location: [place] CV LAB;  Service: Cardiovascular;  Laterality: N/A;   CARDIAC CATHETERIZATION N/A 04/06/2016   Procedure: Coronary Stent Intervention;  Surgeon: [name], MD;  Location: [place] CV LAB;  Service: Cardiovascular;  Laterality: N/A;   CATARACT EXTRACTION W/ INTRAOCULAR LENS  IMPLANT, BILATERAL Bilateral    COLONOSCOPY  06-29-12   per Dr. [name], adenomatous polyps, repeat in 3 yrs    CORONARY ANGIOPLASTY WITH STENT PLACEMENT  04/06/2016   "1 stent"   CYSTOSCOPY/RETROGRADE/URETEROSCOPY/STONE EXTRACTION WITH BASKET Left    removed 2 stones from left ureter   INGUINAL HERNIA REPAIR Right 04/1990   Dr. [name]   LAPAROSCOPIC CHOLECYSTECTOMY  [phone]   Dr. [name]  MELANOMA EXCISION Right    "@ cheek near ear"   TEE WITHOUT CARDIOVERSION N/A 11/11/2014   Procedure: TRANSESOPHAGEAL ECHOCARDIOGRAM (TEE);  Surgeon: [name], MD;  Location: [facility] ENDOSCOPY;  Service: Cardiovascular;  Laterality: N/A;  needs INR before case   VASECTOMY         Home Medications    Prior to Admission medications   Medication Sig Start Date End Date Taking? Authorizing Provider  HYDROcodone-homatropine (HYCODAN) 5-1.5 MG/5ML syrup Take 5 mLs by  mouth every 4 (four) hours as needed for cough. 09/23/20  Yes [name], MD  atorvastatin (LIPITOR) 80 MG tablet Take 1 tablet (80 mg total) by mouth daily. Patient taking differently: Take 80 mg by mouth at bedtime. 04/19/16   Eileen Stanford, PA-C  benzonatate (TESSALON PERLES) 100 MG capsule Take 1 capsule (100 mg total) by mouth 3 (three) times daily as needed. 04/21/21   Colin Benton R, DO  CLARITIN 10 MG CAPS Take 1 capsule by mouth daily. 08/08/19   [provider]  [employer] ACETAMINOPHEN 325 MG CAPS Take 1 capsule by mouth 2 (two) times daily.  08/08/19   [provider]  diltiazem (CARDIZEM) 30 MG tablet Take 1 tablet every 4 hours AS NEEDED for afib heart rate over 100 Patient not taking: Reported on 04/03/2021 08/08/17   Sherran Needs, NP  docusate sodium (COLACE) 100 MG capsule Take 1 capsule (100 mg total) by mouth 2 (two) times daily. 01/30/21   [name], MD  finasteride (PROSCAR) 5 MG tablet Take 5 mg daily by mouth.    [provider]  furosemide (LASIX) 20 MG tablet Take 1 tab daily as needed for fluid retention. 02/04/21   [name], MD  latanoprost (XALATAN) 0.005 % ophthalmic solution Place 1 drop into both eyes at bedtime.     [provider]  lisinopril (PRINIVIL,ZESTRIL) 10 MG tablet Take 10 mg by mouth at bedtime.     [provider]  metoprolol tartrate (LOPRESSOR) 12.5 mg TABS tablet Take 12.5 mg by mouth 2 (two) times daily.    [provider]  mometasone (NASONEX) 50 MCG/ACT nasal spray Place 2 sprays into both nostrils daily as needed (allergies).    [provider]  Multiple Vitamins-Minerals (CENTRUM SILVER PO) Take 1 tablet by mouth every morning.    [provider]  pantoprazole (PROTONIX) 40 MG tablet Take 1 tablet (40 mg total) by mouth daily. 04/19/16   Eileen Stanford, PA-C  potassium chloride (KLOR-CON) 10 MEQ CR tablet Take 10 mEq by mouth every morning.    [provider]  tamsulosin (FLOMAX) 0.4 MG CAPS Take 0.4 mg by mouth 2 (two) times daily.     [provider]  TOLAK 4 % CREA Apply 1 application topically daily as needed. 08/08/19   [provider]  warfarin (COUMADIN) 5 MG tablet Take 1 to 2 tablets daily as directed by the coumadin clinic 03/02/21   [name], MD    Family History Family History  Problem Relation Age of Onset   Heart attack Father 88   Heart disease Father    Ovarian cancer Mother     Social History Social History   Tobacco Use   Smoking status: Never   Smokeless tobacco: Never  Vaping Use   Vaping Use: Never used  Substance Use Topics   Alcohol use: No    Alcohol/week: 0.0 standard drinks   Drug use: No  Allergies   Sulfa antibiotics and Sulfamethoxazole   Review of Systems Review of Systems Pertinent negatives listed in HPI   Physical Exam Triage Vital Signs ED Triage Vitals  Enc Vitals Group     BP 04/21/21 1956 (!) 179/68     Pulse Rate 04/21/21 1956 85     Resp 04/21/21 1956 18     Temp 04/21/21 1956 (!) 100.7 F (38.2 C)     Temp src --      SpO2 04/21/21 1956 94 %     Weight --      Height --      Head Circumference --      Peak Flow --      Pain Score 04/21/21 1957 0     Pain Loc --      Pain Edu? --      Excl. in [place]? --    No data found.  Updated Vital Signs BP (!) 179/68 (BP Location: Left Arm)   Pulse 85   Temp (!) 100.7 F (38.2 C)   Resp 18   SpO2 94%   Visual Acuity Right Eye Distance:   Left Eye Distance:   Bilateral Distance:    Right Eye Near:   Left Eye Near:    Bilateral Near:     Physical Exam Constitutional:      General: He is not in acute distress.    Appearance: He is ill-appearing. He is not toxic-appearing.  HENT:     Head: Normocephalic.     Nose: Congestion and rhinorrhea present.  Eyes:     Extraocular Movements: Extraocular movements intact.     Pupils: Pupils are equal, round, and reactive to light.  Cardiovascular:      Rate and Rhythm: Rhythm irregular.     Comments: Regular-irregular rhythm, normal rate. Pulmonary:     Breath sounds: Rhonchi present.  Musculoskeletal:     Cervical back: Normal range of motion.  Skin:    General: Skin is warm.     Capillary Refill: Capillary refill takes less than 2 seconds.  Neurological:     General: No focal deficit present.     Mental Status: He is alert and oriented to person, place, and time.  Psychiatric:        Mood and Affect: Mood normal.        Behavior: Behavior normal.        Judgment: Judgment normal.     UC Treatments / Results  Labs (all labs ordered are listed, but only abnormal results are displayed) Labs Reviewed  COVID-19, FLU A+B NAA    EKG   Radiology DG Chest 2 View  Result Date: 04/21/2021 CLINICAL DATA:  COVID cough EXAM: CHEST - 2 VIEW COMPARISON:  11/06/2017 FINDINGS: Bronchitic changes without focal opacity, pleural effusion or pneumothorax. Normal cardiomediastinal silhouette. No pneumothorax. IMPRESSION: No active cardiopulmonary disease. Electronically Signed   By: Donavan Foil M.D.   On: 04/21/2021 20:23    Procedures Procedures (including critical care time)  Medications Ordered in UC Medications - No data to display  Initial Impression / Assessment and Plan / UC Course  I have reviewed the triage vital signs and the nursing notes.  Pertinent labs & imaging results that were available during my care of the patient were reviewed by me and considered in my medical decision making (see chart for details).    COVID positive home test. Chest x-ray negative for pneumonia. Given high risk commodities, will initiate  Antiviral treatment  with Paxlovid. GFR > 60, full dose prescribed. Red flag precautions given warranting ER evaluation. Continue benzonatate pearls for cough. Patient stable for home management. Final Clinical Impressions(s) / UC Diagnoses   Final diagnoses:  [id number] virus infection  Cough  Fever,  unspecified     Discharge Instructions      Your test for COVID-19 was positive, meaning that you were infected with the novel coronavirus and could give the germ to others.  Please continue isolation at home for at least 10 days since the start of your symptoms. If you do not have symptoms, please isolate at home for 10 days from the day you were tested. Once you complete your 10 day quarantine, you may return to normal activities as long as you've not had a fever for over 24 hours(without taking fever reducing medicine) and your symptoms are improving. Please continue good preventive care measures, including:  frequent hand-washing, avoid touching your face, cover coughs/sneezes, stay out of crowds and keep a 6 foot distance from others.  Go to the nearest hospital emergency room if fever/cough/breathlessness are severe or illness seems like a threat to life.        ED Prescriptions     Medication Sig Dispense Auth. Provider   nirmatrelvir/ritonavir EUA, renal dosing, (PAXLOVID) TBPK Take 2 tablets by mouth 2 (two) times daily for 5 days. Patient GFR is >60 Take nirmatrelvir (150 mg) one tablet twice daily for 5 days and ritonavir (100 mg) one tablet twice daily for 5 days. 20 tablet Scot Jun, FNP      PDMP not reviewed this encounter.   Scot Jun, FNP 04/23/21 [phone]

## 2021-04-21 NOTE — Discharge Instructions (Addendum)

## 2021-04-21 NOTE — ED Triage Notes (Signed)
Pt c/o cough, sore throat, and low grade temp since yesterday. States had a positive home covid test.

## 2021-04-21 NOTE — Progress Notes (Signed)
Virtual Visit via Telephone Note  I connected with [name] [name] on 04/21/21 at  6:00 PM EDT by telephone and verified that I am speaking with the correct person using two identifiers.   I discussed the limitations, risks, security and privacy concerns of performing an evaluation and management service by telephone and the availability of in person appointments. I also discussed with the patient that there may be a patient responsible charge related to this service. The patient expressed understanding and agreed to proceed.  Location patient: home, [place] Location provider: work or home office Participants present for the call: patient, provider Patient did not have a visit with me in the prior 7 days to address this/these issue(s).   History of Present Illness:  Acute telemedicine visit for a "cold": -Onset: yesterday, he was possibly exposed to Covid recently -Symptoms include: scratchy throat, cough, nasal congestion, low grade fever, mild body aches -Denies:NVD, CP, SOB, inability to eat/drink/get out of bed -he is requesting higher dose of hydrocodone cough syrup and a zpack -Pertinent past medical history: see below -Pertinent medication allergies: Allergies  Allergen Reactions   Sulfa Antibiotics Other (See Comments)    Severe peeling of the skin on the groin area   Sulfamethoxazole Other (See Comments)    REACTION: rash  -COVID-19 vaccine status: had 2 doses + a booster   Observations/Objective: Patient sounds cheerful and well on the phone. I do not appreciate any SOB. Speech and thought processing are grossly intact. Patient reported vitals:  Assessment and Plan:  Nasal congestion  Cough  -we discussed possible serious and likely etiologies, options for evaluation and workup, limitations of telemedicine visit vs in person visit, treatment, treatment risks and precautions.  Query possible COVID-19, influenza, viral upper respiratory illness versus other.  He wanted a  Z-Pak and a narcotic cough syrup, however did explain that I am not to prescribe narcotic containing medications per telemedicine protocols and that a Z-Pak would not be appropriate treatment if this is COVID or influenza.  Given he has high risk, did advise COVID testing.  Discussed options for further evaluation and he agreed to go to an urgent care this evening as his PCP office is not available.  We also did want me to go ahead and send in Tessalon for cough in case this is helpful.  He also wanted me to send a message to his primary care doctor to keep him in the loop.  Copy PCP on this note. Did let the patient know that I only do telemedicine shifts for [facility] on Tuesdays and Thursdays and advised a follow up visit with PCP or at an [facility] if has further questions or concerns.   Follow Up Instructions:  I did not refer this patient for an OV with me in the next 24 hours for this/these issue(s).  I discussed the assessment and treatment plan with the patient. The patient was provided an opportunity to ask questions and all were answered. The patient agreed with the plan and demonstrated an understanding of the instructions.   I spent 18 minutes on the date of this visit in the care of this patient. See summary of tasks completed to properly care for this patient in the detailed notes above which also included counseling of above, review of PMH, medications, allergies, evaluation of the patient and ordering and/or  instructing patient on testing and care options.     Lucretia Kern, DO

## 2021-04-22 ENCOUNTER — Telehealth: Payer: Self-pay | Admitting: Family Medicine

## 2021-04-22 ENCOUNTER — Encounter: Payer: Self-pay | Admitting: Family Medicine

## 2021-04-22 NOTE — Telephone Encounter (Signed)
Please advise 

## 2021-04-22 NOTE — Telephone Encounter (Signed)
Tell him to get tested for Covid. If this is negative, set up an in person OV

## 2021-04-22 NOTE — Telephone Encounter (Signed)
Patient is calling and stated that he just tested positive for covid and wanted to see if provider can sent a prescription for  HYDROcodone-homatropine [facility]) 5-1.5 MG/5ML syrup   [facility] [place], [address]  [place], [place] [address]  Phone:  [phone]  Fax:  [phone]  CB is [phone]

## 2021-04-22 NOTE — Telephone Encounter (Signed)
Noted  

## 2021-04-23 LAB — COVID-19, FLU A+B NAA
Influenza A, NAA: NOT DETECTED
Influenza B, NAA: NOT DETECTED
SARS-CoV-2, NAA: DETECTED — AB

## 2021-04-23 MED ORDER — HYDROCODONE BIT-HOMATROP MBR 5-1.5 MG/5ML PO SOLN: 5.0000 mL | ORAL | 0 refills | Status: DC | PRN

## 2021-04-23 NOTE — Telephone Encounter (Signed)
Spoke with pt verbalized understanding 

## 2021-04-23 NOTE — Telephone Encounter (Signed)
Done

## 2021-04-28 ENCOUNTER — Encounter: Payer: Self-pay | Admitting: Family Medicine

## 2021-04-29 NOTE — Telephone Encounter (Signed)
No, there is no need for an antibiotic now

## 2021-05-01 ENCOUNTER — Ambulatory Visit (INDEPENDENT_AMBULATORY_CARE_PROVIDER_SITE_OTHER): Payer: Medicare Other | Admitting: Pharmacist

## 2021-05-01 ENCOUNTER — Other Ambulatory Visit: Payer: Self-pay

## 2021-05-01 DIAGNOSIS — I480 Paroxysmal atrial fibrillation: Secondary | ICD-10-CM

## 2021-05-01 DIAGNOSIS — Z5181 Encounter for therapeutic drug level monitoring: Secondary | ICD-10-CM | POA: Diagnosis not present

## 2021-05-01 LAB — POCT INR: INR: 1.7 — AB (ref 2.0–3.0)

## 2021-05-01 NOTE — Patient Instructions (Signed)
Description   Take 2 tablets today, then continue taking Warfarin 1 tablet daily except for 1.5 tablets on Mondays and Fridays.  Recheck INR in 3 weeks. Call with any questions or concerns [phone]

## 2021-05-22 ENCOUNTER — Ambulatory Visit (INDEPENDENT_AMBULATORY_CARE_PROVIDER_SITE_OTHER): Payer: Medicare Other | Admitting: Pharmacist

## 2021-05-22 ENCOUNTER — Other Ambulatory Visit: Payer: Self-pay

## 2021-05-22 DIAGNOSIS — Z5181 Encounter for therapeutic drug level monitoring: Secondary | ICD-10-CM

## 2021-05-22 DIAGNOSIS — I480 Paroxysmal atrial fibrillation: Secondary | ICD-10-CM | POA: Diagnosis not present

## 2021-05-22 LAB — POCT INR: INR: 1.9 — AB (ref 2.0–3.0)

## 2021-05-22 NOTE — Patient Instructions (Signed)
Description   Take 2 tablets today, then start taking Warfarin 1 tablet daily except for 1.5 tablets on Mondays, Wednesdays, and Fridays.  Recheck INR in 3 weeks. Call with any questions or concerns [phone]

## 2021-06-01 DIAGNOSIS — N401 Enlarged prostate with lower urinary tract symptoms: Secondary | ICD-10-CM | POA: Diagnosis not present

## 2021-06-01 DIAGNOSIS — N200 Calculus of kidney: Secondary | ICD-10-CM | POA: Diagnosis not present

## 2021-06-01 DIAGNOSIS — R3912 Poor urinary stream: Secondary | ICD-10-CM | POA: Diagnosis not present

## 2021-06-06 ENCOUNTER — Telehealth: Payer: Self-pay | Admitting: Physician Assistant

## 2021-06-06 NOTE — Telephone Encounter (Signed)
Pt granddaughter called stating pt has had a HR 130-150s since about noon yesterday. He is not symptomatic. He has taken cardizem 30 mg since yesterday. BP 107/80. He is a little unsteady after taking cardizem, but not near-syncope or syncope. No chest pain. So symptoms of CHF.   Looking at INR, he has had subtherapeutic readings and coumadin dosing was recently increased. I do not think he would be a DCCV candidate if he came to the ER without a TEE. He also states he has discussed this with Dr. Percival Spanish in the past and wants to try to "wait it out" at home since past DCCV failed. We discussed possibly holding tonight's dose of lisinopril to allow room for additional doses of cardizem tonight and tomorrow. We also discussed that if HR sustained and he became symptomatic, we can bring him into the ED for IV mediation to control his rate.   While we were on the phone, his HR dropped to the 100s, then 70s. Granddaughter states pulse feels regular. He may have converted while we were on the phone. Since he has been in Afib for approximately 24 hrs, we discussed taking an additional cardizem at the 4 hr mark if his HR is above 70. After careful discussion with his granddaughter, I felt very comfortable discussing if-then scenarios and encouraged her to call back if she wanted to talk through options.   I will send a message to Dr. Percival Spanish and triage to see if we can have him seen early next week.   Ledora Bottcher, PA-C 06/06/2021, 1:38 PM [facility] [address] [place],  [address]

## 2021-06-08 NOTE — Telephone Encounter (Signed)
Spoke with pt, he is feeling fine at this time and does not feel he needs to be seen prior to his 6 months follow up in October. Patient voiced understanding to call if he has further problems.

## 2021-06-09 ENCOUNTER — Emergency Department (HOSPITAL_COMMUNITY): Payer: Medicare Other

## 2021-06-09 ENCOUNTER — Observation Stay (HOSPITAL_COMMUNITY)
Admission: EM | Admit: 2021-06-09 | Discharge: 2021-06-10 | Disposition: A | Discharge status: Home / Self Care | Payer: Medicare Other | Attending: Cardiology | Admitting: Cardiology

## 2021-06-09 ENCOUNTER — Encounter (HOSPITAL_COMMUNITY): Payer: Self-pay | Admitting: Emergency Medicine

## 2021-06-09 ENCOUNTER — Other Ambulatory Visit: Payer: Self-pay

## 2021-06-09 DIAGNOSIS — E785 Hyperlipidemia, unspecified: Secondary | ICD-10-CM | POA: Diagnosis present

## 2021-06-09 DIAGNOSIS — U071 COVID-19: Secondary | ICD-10-CM | POA: Insufficient documentation

## 2021-06-09 DIAGNOSIS — I509 Heart failure, unspecified: Secondary | ICD-10-CM

## 2021-06-09 DIAGNOSIS — I480 Paroxysmal atrial fibrillation: Secondary | ICD-10-CM | POA: Diagnosis not present

## 2021-06-09 DIAGNOSIS — K219 Gastro-esophageal reflux disease without esophagitis: Secondary | ICD-10-CM | POA: Diagnosis present

## 2021-06-09 DIAGNOSIS — I4891 Unspecified atrial fibrillation: Secondary | ICD-10-CM | POA: Diagnosis present

## 2021-06-09 DIAGNOSIS — R0602 Shortness of breath: Secondary | ICD-10-CM

## 2021-06-09 DIAGNOSIS — I5031 Acute diastolic (congestive) heart failure: Secondary | ICD-10-CM | POA: Insufficient documentation

## 2021-06-09 DIAGNOSIS — I10 Essential (primary) hypertension: Secondary | ICD-10-CM | POA: Diagnosis not present

## 2021-06-09 DIAGNOSIS — Z7901 Long term (current) use of anticoagulants: Secondary | ICD-10-CM | POA: Diagnosis not present

## 2021-06-09 DIAGNOSIS — Z79899 Other long term (current) drug therapy: Secondary | ICD-10-CM | POA: Insufficient documentation

## 2021-06-09 DIAGNOSIS — I110 Hypertensive heart disease with heart failure: Secondary | ICD-10-CM | POA: Diagnosis not present

## 2021-06-09 DIAGNOSIS — N401 Enlarged prostate with lower urinary tract symptoms: Secondary | ICD-10-CM | POA: Diagnosis present

## 2021-06-09 DIAGNOSIS — I2510 Atherosclerotic heart disease of native coronary artery without angina pectoris: Secondary | ICD-10-CM | POA: Diagnosis not present

## 2021-06-09 DIAGNOSIS — N138 Other obstructive and reflux uropathy: Secondary | ICD-10-CM | POA: Diagnosis present

## 2021-06-09 DIAGNOSIS — R609 Edema, unspecified: Secondary | ICD-10-CM

## 2021-06-09 DIAGNOSIS — R531 Weakness: Secondary | ICD-10-CM | POA: Diagnosis present

## 2021-06-09 LAB — BASIC METABOLIC PANEL
Anion gap: 6 (ref 5–15)
BUN: 19 mg/dL (ref 8–23)
CO2: 28 mmol/L (ref 22–32)
Calcium: 9.3 mg/dL (ref 8.9–10.3)
Chloride: 103 mmol/L (ref 98–111)
Creatinine, Ser: 1.09 mg/dL (ref 0.61–1.24)
GFR, Estimated: >60 mL/min (ref >60)
Glucose, Bld: 159 mg/dL — ABNORMAL HIGH (ref 70–99)
Potassium: 4 mmol/L (ref 3.5–5.1)
Sodium: 137 mmol/L (ref 135–145)

## 2021-06-09 LAB — PROTIME-INR
INR: 2.9 — ABNORMAL HIGH (ref 0.8–1.2)
Prothrombin Time: 30.1 seconds — ABNORMAL HIGH (ref 11.4–15.2)

## 2021-06-09 LAB — CBC
HCT: 40.4 % (ref 39.0–52.0)
Hemoglobin: 13.2 g/dL (ref 13.0–17.0)
MCH: 32.9 pg (ref 26.0–34.0)
MCHC: 32.7 g/dL (ref 30.0–36.0)
MCV: 100.7 fL — ABNORMAL HIGH (ref 80.0–100.0)
Platelets: 200 10*3/uL (ref 150–400)
RBC: 4.01 MIL/uL — ABNORMAL LOW (ref 4.22–5.81)
RDW: 13.5 % (ref 11.5–15.5)
WBC: 8.7 10*3/uL (ref 4.0–10.5)
nRBC: 0 % (ref 0.0–0.2)

## 2021-06-09 LAB — RESP PANEL BY RT-PCR (FLU A&B, COVID) ARPGX2
Influenza A by PCR: NEGATIVE
Influenza B by PCR: NEGATIVE
SARS Coronavirus 2 by RT PCR: POSITIVE — AB

## 2021-06-09 LAB — TROPONIN I (HIGH SENSITIVITY)
Troponin I (High Sensitivity): 4 ng/L (ref <18)
Troponin I (High Sensitivity): 4 ng/L (ref <18)

## 2021-06-09 LAB — BRAIN NATRIURETIC PEPTIDE: B Natriuretic Peptide: 35.1 pg/mL (ref 0.0–100.0)

## 2021-06-09 LAB — MAGNESIUM: Magnesium: 2.2 mg/dL (ref 1.7–2.4)

## 2021-06-09 MED ORDER — LATANOPROST 0.005 % OP SOLN
1.0000 [drp] | Freq: Every day | OPHTHALMIC | Status: DC
  Administered 2021-06-09: 1 [drp] via OPHTHALMIC
  Filled 2021-06-09: qty 2.5

## 2021-06-09 MED ORDER — FLUOROURACIL 4 % EX CREA: 1.0000 "application " | TOPICAL_CREAM | Freq: Every day | CUTANEOUS | Status: DC | PRN

## 2021-06-09 MED ORDER — TAMSULOSIN HCL 0.4 MG PO CAPS
0.4000 mg | ORAL_CAPSULE | Freq: Two times a day (BID) | ORAL | Status: DC
  Administered 2021-06-09 – 2021-06-10 (×2): 0.4 mg via ORAL
  Filled 2021-06-09 (×2): qty 1

## 2021-06-09 MED ORDER — FUROSEMIDE 10 MG/ML IJ SOLN
40.0000 mg | Freq: Once | INTRAMUSCULAR | Status: AC
  Administered 2021-06-09: 40 mg via INTRAVENOUS
  Filled 2021-06-09: qty 4

## 2021-06-09 MED ORDER — FINASTERIDE 5 MG PO TABS
5.0000 mg | ORAL_TABLET | Freq: Every day | ORAL | Status: DC
  Administered 2021-06-09 – 2021-06-10 (×2): 5 mg via ORAL
  Filled 2021-06-09 (×2): qty 1

## 2021-06-09 MED ORDER — ONDANSETRON HCL 4 MG/2ML IJ SOLN: 4.0000 mg | Freq: Four times a day (QID) | INTRAMUSCULAR | Status: DC | PRN

## 2021-06-09 MED ORDER — WARFARIN SODIUM 5 MG PO TABS
5.0000 mg | ORAL_TABLET | ORAL | Status: DC
  Administered 2021-06-09: 5 mg via ORAL
  Filled 2021-06-09: qty 1

## 2021-06-09 MED ORDER — DILTIAZEM HCL 60 MG PO TABS
30.0000 mg | ORAL_TABLET | ORAL | Status: DC | PRN
  Filled 2021-06-09: qty 1

## 2021-06-09 MED ORDER — ATORVASTATIN CALCIUM 80 MG PO TABS
80.0000 mg | ORAL_TABLET | Freq: Every day | ORAL | Status: DC
  Administered 2021-06-09: 80 mg via ORAL
  Filled 2021-06-09: qty 1

## 2021-06-09 MED ORDER — WARFARIN SODIUM 7.5 MG PO TABS
7.5000 mg | ORAL_TABLET | ORAL | Status: DC
  Administered 2021-06-10: 7.5 mg via ORAL
  Filled 2021-06-09: qty 1

## 2021-06-09 MED ORDER — ACETAMINOPHEN 325 MG PO TABS: 650.0000 mg | ORAL_TABLET | Freq: Four times a day (QID) | ORAL | Status: DC | PRN

## 2021-06-09 MED ORDER — ACETAMINOPHEN 325 MG PO TABS: 650.0000 mg | ORAL_TABLET | ORAL | Status: DC | PRN

## 2021-06-09 MED ORDER — PANTOPRAZOLE SODIUM 40 MG PO TBEC
40.0000 mg | DELAYED_RELEASE_TABLET | Freq: Every day | ORAL | Status: DC
  Administered 2021-06-09 – 2021-06-10 (×2): 40 mg via ORAL
  Filled 2021-06-09 (×2): qty 1

## 2021-06-09 MED ORDER — WARFARIN - PHARMACIST DOSING INPATIENT: Freq: Every day | Status: DC

## 2021-06-09 MED ORDER — METOPROLOL TARTRATE 12.5 MG HALF TABLET
12.5000 mg | ORAL_TABLET | Freq: Two times a day (BID) | ORAL | Status: DC
  Administered 2021-06-09 – 2021-06-10 (×2): 12.5 mg via ORAL
  Filled 2021-06-09 (×2): qty 1

## 2021-06-09 MED ORDER — LISINOPRIL 10 MG PO TABS
10.0000 mg | ORAL_TABLET | Freq: Every day | ORAL | Status: DC
  Administered 2021-06-09: 10 mg via ORAL
  Filled 2021-06-09: qty 1

## 2021-06-09 NOTE — ED Notes (Addendum)
Report given to Noralyn Pick, RN

## 2021-06-09 NOTE — Progress Notes (Addendum)
ANTICOAGULATION CONSULT NOTE - Initial Consult  Pharmacy Consult for Warfarin Indication: atrial fibrillation  Allergies  Allergen Reactions   Sulfa Antibiotics Other (See Comments)    Severe peeling of the skin on the groin area   Sulfamethoxazole Other (See Comments)    REACTION: rash    Vital Signs: Temp: 98.4 F (36.9 C) (08/02 1309) Temp Source: Oral (08/02 1309) BP: 147/67 (08/02 1615) Pulse Rate: 59 (08/02 1615)  Labs: Recent Labs    06/09/21 1316  HGB 13.2  HCT 40.4  PLT 200  LABPROT 30.1*  INR 2.9*  CREATININE 1.09  TROPONINIHS 4    CrCl cannot be calculated (Unknown ideal weight.).   Medical History: Past Medical History:  Diagnosis Date   Coronary artery disease    a. LHC on 04/06/16 which revealed 40% occl pRCA, 85% occl D1, 75% occl Ramus, 65% occl mLCx, 45% occl pLAD, 90% occl pLAD and nl LV function. s/p PCI/DES to prox LAD. Medical therapy for diag disease.     GERD (gastroesophageal reflux disease)    Glaucoma    Hemorrhoids    internal   HTN (hypertension)    Hx of colonic polyps    tubular adenoma    Hyperlipidemia    Kidney stone    Melanoma of cheek ([place])    Nephrolithiasis    Pancolonic diverticulosis    Paroxysmal atrial fibrillation ([place])    a. s/p ablation 12/2014: on coumadin    Assessment: 87 YO male presenting with fatigue, generalized weakness, SOB with exertion and found to be in paroxysmal atrial fibrillation with RVR. He has been in and out of afib since 7/30 for more than 20 hours and HR into the 140s. PTA warfarin dose is 7.5 mg MWF and 5 mg all other days. INR on 8/2 is 2.9.  Goal of Therapy:  INR 2-3 Monitor platelets by anticoagulation protocol: Yes   Plan:  Will resume home regimen as above, '5mg'$  to be given tonight. F/u INR in AM and adjust as needed.  Varney Daily, PharmD PGY1 Pharmacy Resident  Please check AMION for all [facility] pharmacy phone numbers After 10:00 PM call main pharmacy [phone]

## 2021-06-09 NOTE — H&P (Addendum)
Cardiology Admission History and Physical:   Patient ID: [name] [name] MRN: [id number]; DOB: 1929/04/06   Admission date: 06/09/2021  PCP:  [name], MD   [facility] Providers Cardiologist:  [name], MD   {   Chief Complaint: Generalized weakness and shortness of breath  Patient Profile:   [name] [name] is a 87 y.o. male with past medical history of paroxysmal A. fib, CAD s/p PCI to DES ad prox LAD, hypertension, hyperlipidemia, BPH, who is being seen 06/09/2021 for the evaluation of generalized weakness and shortness of breath.  History of Present Illness:   [name] [name] follows Dr. Percival Spanish outpatient for CAD, hypertension, and paroxysmal A. Fib.   He was initially diagnosed with paroxysmal A. fib RVR in 2008, did not tolerate flecainide due to dizziness/imbalance/bradycardia, has been anticoagulated on Coumadin, underwent cardiac ablation on 11/12/2014 by Dr. [name].  Unfortunately he had a recurrence of atrial fibrillation 03/2016 with worsening fatigue.  Recurrence was very few and he was maintained on metoprolol. He had near syncope in 06/2017, found to be bradycardic, metoprolol dose was reduced. He had no further episodes on follow up. Later he went to ED in Dec 2018 for long lasting persistent A fib, was seen by [name], reluctant to re-consider ablation due to hx of groin hematoma after prior procedure, not interested in hospitalization for tikosyn loading, worried about cost of multaq, and eventually decided to maintain medical therapy medical therapy.   He was also concerned for having anginal equivalent symptoms on 03/2016 when he c/o fatigue, had a POET completed on 04/01/16 which showed 2 to 3 mm ST depression in inferior leads suggesting ischemia.  He had subsequent left heart catheterization on 04/06/2016 which revealed 40% stenosis pRCA, 85% stenosis D1, 75% stenosis of ramus, 65% stenosis mLCx, 45% stenosis pLAD, 90%stenosis pLAD, normal LV function. He was  treated with PCI /DES to pLAD. He was recommended medical therapy for remaining disease with ASA, plavix, coumadin, BB, lipitor.  Last stress Myoview from 06/16/2017 was low risk study and no evidence of ischemia.  He was last seen in the office on 03/02/21 for follow up, had one symptomatic paroxysmal A. fib episode that was not bothersome and self-limited.  He was maintained on beta-blocker, as needed Cardizem, and Coumadin for anticoagulation, did not want to switch to a DOAC.  He had no angina symptoms.  On 06/06/2021, patient's granddaughter called office reporting that patient had heart rate of 1 30-1 50s since noon the day before.  He was asymptomatic, no syncope or near syncope episode, was felt a little unsteady after taking Cardizem with blood pressure 107/80.  He had a subsequent heart rate improvement to 70s while on the phone, he was advised taking additional Cardizem if heart rate becomes elevated above 70, after was made to make early follow-up appointment with Dr. Percival Spanish.  He presented to the ER today complaining generalized weakness, mild shortness of breath, heart palpitation.  He reports remained in atrial fibrillation with HR >140s  > 24 hours this past weekend.  He had taken Cardizem every 4 hours on Saturday, a fib appeared converted but subsequently reoccurred on Sunday.  He felt grossly fatigued and weak without improvement on Monday.  He states that he usually feels much better when he is out of atrial fibrillation.  He states his A fib episode does not last this long (>20 hours), and he seems remained in A fib the whole weekend. He had some leg swelling that  started today, he did not pay too much attention to it. He states he is feeling better now as his HR is controlled. His son states his leg swelling is already better after getting Lasix at ED. He has urinated a lot. He denied any orthopnea, dizziness, chest pain, chest pressure, syncope. He is still not interested with ablation.      Diagnostic work-up today including CBC revealed hyperglycemia 159, otherwise unremarkable.  High sensitive troponin negative x1.  BNP 35.1.  CBC unremarkable.  INR 2.9.  Chest x-ray revealed no acute cardiopulmonary finding.  EKG revealed sinus rhythm with ventricular rate of 76 bpm, low voltage QRS, occasional PACs, no acute change.  Cardiology is asked to evaluate the patient.   Past Medical History:  Diagnosis Date   Coronary artery disease    a. LHC on 04/06/16 which revealed 40% occl pRCA, 85% occl D1, 75% occl Ramus, 65% occl mLCx, 45% occl pLAD, 90% occl pLAD and nl LV function. s/p PCI/DES to prox LAD. Medical therapy for diag disease.     GERD (gastroesophageal reflux disease)    Glaucoma    Hemorrhoids    internal   HTN (hypertension)    Hx of colonic polyps    tubular adenoma    Hyperlipidemia    Kidney stone    Melanoma of cheek ([place])    Nephrolithiasis    Pancolonic diverticulosis    Paroxysmal atrial fibrillation ([place])    a. s/p ablation 12/2014: on coumadin    Past Surgical History:  Procedure Laterality Date   ATRIAL FIBRILLATION ABLATION N/A 11/12/2014   Procedure: ATRIAL FIBRILLATION ABLATION;  Surgeon: [name], MD;  Location: [facility] CATH LAB;  Service: Cardiovascular;  Laterality: N/A;   BLADDER SURGERY     ruptured blood vessel in the bladder 3 weeks S/P ureter stones removed   CARDIAC CATHETERIZATION N/A 04/06/2016   Procedure: Left Heart Cath and Coronary Angiography;  Surgeon: [name], MD;  Location: [place] CV LAB;  Service: Cardiovascular;  Laterality: N/A;   CARDIAC CATHETERIZATION N/A 04/06/2016   Procedure: Coronary Stent Intervention;  Surgeon: [name], MD;  Location: [place] CV LAB;  Service: Cardiovascular;  Laterality: N/A;   CATARACT EXTRACTION W/ INTRAOCULAR LENS  IMPLANT, BILATERAL Bilateral    COLONOSCOPY  06-29-12   per Dr. [name], adenomatous polyps, repeat in 3 yrs    [place]  04/06/2016    "1 stent"   CYSTOSCOPY/RETROGRADE/URETEROSCOPY/STONE EXTRACTION WITH BASKET Left    removed 2 stones from left ureter   INGUINAL HERNIA REPAIR Right 04/1990   Dr. [name]   LAPAROSCOPIC CHOLECYSTECTOMY  [phone]   Dr. [name]   MELANOMA EXCISION Right    "@ cheek near ear"   TEE WITHOUT CARDIOVERSION N/A 11/11/2014   Procedure: TRANSESOPHAGEAL ECHOCARDIOGRAM (TEE);  Surgeon: [name], MD;  Location: [facility] ENDOSCOPY;  Service: Cardiovascular;  Laterality: N/A;  needs INR before case   VASECTOMY       Medications Prior to Admission: Prior to Admission medications   Medication Sig Start Date End Date Taking? Authorizing Provider  atorvastatin (LIPITOR) 80 MG tablet Take 1 tablet (80 mg total) by mouth daily. Patient taking differently: Take 80 mg by mouth at bedtime. 04/19/16  Yes Angelena Form R, PA-C  CLARITIN 10 MG CAPS Take 1 capsule by mouth at bedtime. 08/08/19  Yes [provider]  [employer] ACETAMINOPHEN 325 MG CAPS Take 1 capsule by mouth 2 (two) times daily.  08/08/19  Yes [provider]  diltiazem (CARDIZEM) 30 MG tablet Take 1 tablet every 4 hours AS NEEDED for afib heart rate over 100 08/08/17  Yes Sherran Needs, NP  finasteride (PROSCAR) 5 MG tablet Take 5 mg daily by mouth.   Yes [provider]  furosemide (LASIX) 20 MG tablet Take 1 tab daily as needed for fluid retention. Patient taking differently: Take 20 mg by mouth 2 (two) times daily. Take 1 tab daily as needed for fluid retention. 02/04/21  Yes [name], MD  latanoprost (XALATAN) 0.005 % ophthalmic solution Place 1 drop into both eyes at bedtime.    Yes [provider]  lisinopril (PRINIVIL,ZESTRIL) 10 MG tablet Take 10 mg by mouth at bedtime.    Yes [provider]  metoprolol tartrate (LOPRESSOR) 12.5 mg TABS tablet Take 12.5 mg by mouth 2 (two) times daily.   Yes [provider]  mometasone (NASONEX) 50 MCG/ACT nasal spray Place 2 sprays into both nostrils  daily as needed (allergies).   Yes [provider]  Multiple Vitamins-Minerals (CENTRUM SILVER PO) Take 1 tablet by mouth every morning.   Yes [provider]  pantoprazole (PROTONIX) 40 MG tablet Take 1 tablet (40 mg total) by mouth daily. 04/19/16  Yes Eileen Stanford, PA-C  potassium chloride (KLOR-CON) 10 MEQ CR tablet Take 10 mEq by mouth every morning.   Yes [provider]  tamsulosin (FLOMAX) 0.4 MG CAPS Take 0.4 mg by mouth 2 (two) times daily.    Yes [provider]  TOLAK 4 % CREA Apply 1 application topically daily as needed (rough place on scalp). 08/08/19  Yes [provider]  warfarin (COUMADIN) 5 MG tablet Take 1 to 2 tablets daily as directed by the coumadin clinic Patient taking differently: Take 5 mg by mouth. 7.5 mg on Monday Wednesday and friday 03/02/21  Yes Khylen Riolo, [name] Rinks, MD  benzonatate (TESSALON PERLES) 100 MG capsule Take 1 capsule (100 mg total) by mouth 3 (three) times daily as needed. Patient not taking: Reported on 06/09/2021 04/21/21   Lucretia Kern, DO  docusate sodium (COLACE) 100 MG capsule Take 1 capsule (100 mg total) by mouth 2 (two) times daily. Patient not taking: Reported on 06/09/2021 01/30/21   [name], MD  HYDROcodone bit-homatropine [facility]) 5-1.5 MG/5ML syrup Take 5 mLs by mouth every 4 (four) hours as needed. Patient not taking: Reported on 06/09/2021 04/23/21   [name], MD  HYDROcodone-homatropine [facility]) 5-1.5 MG/5ML syrup Take 5 mLs by mouth every 4 (four) hours as needed for cough. Patient not taking: Reported on 06/09/2021 09/23/20   [name], MD     Allergies:    Allergies  Allergen Reactions   Sulfa Antibiotics Other (See Comments)    Severe peeling of the skin on the groin area   Sulfamethoxazole Other (See Comments)    REACTION: rash    Social History:   Social History   Socioeconomic History   Marital status: Widowed    Spouse name: Not on file   Number of children: 2    Years of education: Not on file   Highest education level: Not on file  Occupational History   Occupation: Retired  Tobacco Use   Smoking status: Never   Smokeless tobacco: Never  Vaping Use   Vaping Use: Never used  Substance and Sexual Activity   Alcohol use: No    Alcohol/week: 0.0 standard drinks   Drug use: No   Sexual  activity: Not Currently  Other Topics Concern   Not on file  Social History Narrative   Not on file   Social Determinants of Health   Financial Resource Strain: Low Risk    Difficulty of Paying Living Expenses: Not hard at all  Food Insecurity: No Food Insecurity   Worried About Charity fundraiser in the Last Year: Never true   [place] in the Last Year: Never true  Transportation Needs: No Transportation Needs   Lack of Transportation (Medical): No   Lack of Transportation (Non-Medical): No  Physical Activity: Insufficiently Active   Days of Exercise per Week: 3 days   Minutes of Exercise per Session: 20 min  Stress: No Stress Concern Present   Feeling of Stress : Not at all  Social Connections: Moderately Isolated   Frequency of Communication with Friends and Family: More than three times a week   Frequency of Social Gatherings with Friends and Family: Three times a week   Attends Religious Services: More than 4 times per year   Active Member of Clubs or Organizations: No   Attends Archivist Meetings: Never   Marital Status: Widowed  Human resources officer Violence: Not At Risk   Fear of Current or Ex-Partner: No   Emotionally Abused: No   Physically Abused: No   Sexually Abused: No    Family History:   The patient's family history includes Heart attack (age of onset: 41) in his father; Heart disease in his father; Ovarian cancer in his mother.    ROS:  Constitutional: Fatigue, generalized weakness Eyes: Denied vision change or loss Ears/Nose/Mouth/Throat: Denied ear ache, sore throat, coughing, sinus pain Cardiovascular: See  HPI Respiratory: See HPI Gastrointestinal: Denied nausea, vomiting, abdominal pain, diarrhea Genital/Urinary: Denied dysuria, hematuria, urinary frequency/urgency Musculoskeletal: Denied muscle ache, joint pain, weakness Skin: Denied rash, wound Neuro: Denied headache, dizziness, syncope Psych: Denied history of depression/anxiety  Endocrine: Denied history of diabetes   Physical Exam/Data:   Vitals:   06/09/21 1445 06/09/21 1500 06/09/21 1530 06/09/21 1615  BP: (!) 153/68 (!) 147/73 (!) 148/73 (!) 147/67  Pulse: 61 60 63 (!) 59  Resp: '12 13 14 12  '$ Temp:      TempSrc:      SpO2: 100% 98% 99% 97%    Intake/Output Summary (Last 24 hours) at 06/09/2021 1651 Last data filed at 06/09/2021 1551 Gross per 24 hour  Intake --  Output 950 ml  Net -950 ml   Last 3 Weights 04/03/2021 03/18/2021 03/02/2021  Weight (lbs) 189 lb 189 lb 188 lb 12.8 oz  Weight (kg) 85.73 kg 85.73 kg 85.639 kg     There is no height or weight on file to calculate BMI.   Vitals:  Vitals:   06/09/21 1530 06/09/21 1615  BP: (!) 148/73 (!) 147/67  Pulse: 63 (!) 59  Resp: 14 12  Temp:    SpO2: 99% 97%   General Appearance: In no apparent distress, laying in bed HEENT: Normocephalic, atraumatic. EOMs intact.  Neck: Supple, trachea midline, JVD 7-8 CM  Cardiovascular: Regular rate and rhythm, normal S1-S2,  no murmur/rub/gallop Respiratory: Resting breathing unlabored, lungs sounds clear to auscultation bilaterally, no use of accessory muscles. On room air.  No wheezes, rales or rhonchi.   Gastrointestinal: Bowel sounds positive, abdomen soft, non-tender, non-distended. No mass or organomegaly.  Extremities: Able to move all extremities in bed without difficulty, BLE 1- edema Genitourinary: suction Foley with yellow urine  Musculoskeletal: Normal muscle bulk  and tone, muscle strength 5/5 throughout Skin: Intact, warm, dry. No rashes or petechiae noted in exposed areas.  Neurologic: Alert, oriented to person,  place and time. Fluent speech,  no cognitive deficit, no gross focal neuro deficit Psychiatric: Normal affect. Mood is appropriate.    EKG: reveals sinus rhythm with ventricular rate of 76 bpm, low voltage QRS, occasional PACs, no acute change.  Telemetry : SR with occasional PVCs, rate of 70s   Relevant CV Studies:  Heart catheterization from 04/06/2016 revealed:  Prox RCA lesion, 40% stenosed. Ost 1st Diag to 1st Diag lesion, 85% stenosed. Ost Ramus to Ramus lesion, 75% stenosed. Prox Cx to Mid Cx lesion, 65% stenosed. Prox LAD-1 lesion, 45% stenosed. Prox LAD-2 lesion, 90% stenosed. Post intervention, there is a 0% residual stenosis.   High-grade obstruction in the proximal LAD and first and second diagonals. Moderate mid circumflex obstruction. Mild right coronary obstruction. Angioplasty and stent of the proximal LAD with reduction in 90% stenosis to 0% with TIMI grade 3 flow. Medical therapy for diagonal disease. Postdilatation balloon size 4.5 mm in diameter within a [employer] DES. Normal left ventricular function with EF of 65%   RECOMMENDATIONS: Aspirin, Plavix, and Coumadin 1 month then dropped aspirin. Plavix and Coumadin for at least 6 months. IV amiodarone until conversion of atrial fibrillation. Hopeful discharge in a.m.   Diagnostic Dominance: Right    Intervention      Laboratory Data:  High Sensitivity Troponin:   Recent Labs  Lab 06/09/21 1316  TROPONINIHS 4      Chemistry Recent Labs  Lab 06/09/21 1316  NA 137  K 4.0  CL 103  CO2 28  GLUCOSE 159*  BUN 19  CREATININE 1.09  CALCIUM 9.3  GFRNONAA >60  ANIONGAP 6    No results for input(s): PROT, ALBUMIN, AST, ALT, ALKPHOS, BILITOT in the last 168 hours. Hematology Recent Labs  Lab 06/09/21 1316  WBC 8.7  RBC 4.01*  HGB 13.2  HCT 40.4  MCV 100.7*  MCH 32.9  MCHC 32.7  RDW 13.5  PLT 200   BNP Recent Labs  Lab 06/09/21 1317  BNP 35.1    DDimer No results for  input(s): DDIMER in the last 168 hours.   Radiology/Studies:  DG Chest 1 View  Result Date: 06/09/2021 CLINICAL DATA:  Shortness of breath. EXAM: CHEST  1 VIEW COMPARISON:  Chest x-ray 04/21/2021 FINDINGS: The cardiac silhouette, mediastinal and hilar contours are within normal limits and stable. The lungs are clear of an acute process. No infiltrates or effusions. Some type of clothing artifact is overlying the left lung apex but I do not see any obvious underlying process. No definite pulmonary lesions. The bony thorax is intact. IMPRESSION: No acute cardiopulmonary findings. Electronically Signed   By: Marijo Sanes M.D.   On: 06/09/2021 13:57     Assessment and Plan:    Paroxysmal atrial fibrillation with RVR -Presented with fatigue, generalized weakness, shortness of breath with exertion -Has been in and out of atrial fibrillation since Saturday, heart rate was up to 140s on 06/06/2021, duration appears >20 hours long, rate control appears achieved with p.o. metoprolol and as needed diltiazem -Patient is currently remaining in sinus rhythm, feeling improved -CHA2DS2-VASc score 4, continued on anticoagulation with Coumadin, INR is therapeutic, he does not wish to switch to DOAC - will check TSH, magnesium, Echo - may need EP evaluation for alternative antiarrythmic agents , still not interested in ablation  - admit to telemetry unit  Suspected CHF -Presented with fatigue, generalized weakness, shortness of breath, leg swelling -BNP WNL -Chest x-ray revealed no acute finding -Suspecting tachycardia induced CM -Clinically mildly hypervolemic, improved with lasix   -Received IV Lasix 40 mg x 1 at ED, may need more dosing, repeat BMP tomorrow  - will obtain echocardiogram  CAD with history of PCI/DES to proximal LAD 2017 -High sensitive troponin negative x1 -EKG without acute ischemic change -Symptoms likely from poorly controlled atrial fibrillation and possible CHF, no chest pain   -Stress Myoview negative from 06/16/2017 -No ischemic work-up is indicated at this time -Continue medical therapy with aspirin, Lipitor, metoprolol, lisinopril  Hypertension -BP is fairly controlled for his age -Continue metoprolol and lisinopril  Hyperlipidemia -On Lipitor 80 mg daily, last lipid panel is from 11/8/202, LDL 74, will update lipid   BPH -Resume home meds Flomax and finasteride   Risk Assessment/Risk Scores:    New York Heart Association (NYHA) Functional Class NYHA Class II  CHA2DS2-VASc Score = 4  This indicates a 4.8% annual risk of stroke. The patient's score is based upon: CHF History: No HTN History: Yes Diabetes History: No Stroke History: No Vascular Disease History: Yes Age Score: 2 Gender Score: 0      Severity of Illness: The appropriate patient status for this patient is OBSERVATION. Observation status is judged to be reasonable and necessary in order to provide the required intensity of service to ensure the patient's safety. The patient's presenting symptoms, physical exam findings, and initial radiographic and laboratory data in the context of their medical condition is felt to place them at decreased risk for further clinical deterioration. Furthermore, it is anticipated that the patient will be medically stable for discharge from the hospital within 2 midnights of admission. The following factors support the patient status of observation.   " The patient's presenting symptoms include SOB, leg edema, palpitation " The physical exam findings include dyspnea and leg edema. " The initial radiographic and laboratory data are N/A.   For questions or updates, please contact [place] Please consult [email or web address] for contact info under     Signed, Margie Billet, NP  06/09/2021 4:51 PM    History and all data above reviewed.  Patient examined.  I agree with the findings as above.  The patient has a long history of atrial fibrillation and  well-known to me.  He said ablation.  He had breakthrough bouts of fibrillation but these have been relatively infrequent and not particularly long lived.  He has not wanted repeat ablation or hospitalization take a statin.  We have avoided amiodarone for the previous history of bradycardia.  He has coronary artery disease limiting his antiarrhythmic choices.  He now presents after having this weekend about 28 hours of fibrillation and having to take his as needed Cardizem before finally broke.  When it did not break he felt weak and really did not feel like he thought he should.  He was not describing new shortness of breath, PND or orthopnea but he probably has some increased lower extremity swelling.  He was not having any chest pressure.  He did not have any presyncope or syncope although he did have a fall where he lost his footing this weekend.  Presented to the emergency room was noted to have some increased edema.  He was treated with diuretics by emergency room.  The patient exam reveals COR: Regular rate and rhythm, no murmurs,  Lungs: Clear to auscultation bilaterally,  Abd: Positive bowel sounds  normal in frequency., Ext moderate edema..  All available labs, radiology testing, previous records reviewed. Agree with documented assessment and plan.  Atrial fibrillation: He and I have another discussion today.  Aside from this 1 prolonged episode his paroxysms have been relatively stable pattern.  He would consent to probable treatment with amiodarone but at this point cannot help to start this.  He is on therapeutic anticoagulation.  If he has more frequent breakthroughs in the future we might have to consider that.  Acute diastolic congestive heart failure: He seems to have some excess volume.  I am going to check an echocardiogram.  He was given some IV Cardizem.  We will observe him overnight.   [name] [name]  5:40 PM  06/09/2021

## 2021-06-09 NOTE — ED Triage Notes (Signed)
Pt reports being in A.Fib over the weekend for over 20 hours. He reports coming out of it Sunday but still feels like something is not right. Hx of cardiac stents. Obvious swelling to bilateral legs, denies chest pain.

## 2021-06-09 NOTE — ED Provider Notes (Signed)
[facility] EMERGENCY DEPARTMENT Provider Note   CSN: [id number] Arrival date & time: 06/09/21  1303     History Chief Complaint  Patient presents with   Atrial Fibrillation   Weakness    [name] [name] is a 87 y.o. male.  HPI Patient reports he has history of paroxysmal atrial fibrillation but mostly he stays out of A. fib.  This weekend he had an episode on Saturday and took Cardizem every 4 hours.  It resolved but then he was back in it again on Sunday.  He took several more doses of Cardizem and seem to come out of it again on Sunday.  Patient reports he was fatigued on Monday but usually starts to feel much better once he is out of A. fib.  He reports rather than feeling improved, despite no longer being in atrial fibrillation, he has felt more and more fatigued and generally weak.  He reports that yesterday he was weak to the point that he took a fall.  He reports was a gentle fall without injury but still it was secondary to generalized weakness.  He denies he had any focal weakness numbness or tingling.  No extremity that did not function or any specific gait dysfunction.  Reports he is becoming more short of breath today and has increased swelling in his ankles than typical.    Past Medical History:  Diagnosis Date   Coronary artery disease    a. LHC on 04/06/16 which revealed 40% occl pRCA, 85% occl D1, 75% occl Ramus, 65% occl mLCx, 45% occl pLAD, 90% occl pLAD and nl LV function. s/p PCI/DES to prox LAD. Medical therapy for diag disease.     GERD (gastroesophageal reflux disease)    Glaucoma    Hemorrhoids    internal   HTN (hypertension)    Hx of colonic polyps    tubular adenoma    Hyperlipidemia    Kidney stone    Melanoma of cheek ([place])    Nephrolithiasis    Pancolonic diverticulosis    Paroxysmal atrial fibrillation ([place])    a. s/p ablation 12/2014: on coumadin    Patient Active Problem List   Diagnosis Date Noted   Constipation 02/06/2021    Coronary artery disease involving native coronary artery of native heart without angina pectoris 10/15/2019   Educated about COVID-19 virus infection 10/15/2019   Near syncope 06/10/2017   Chest pain with moderate risk for cardiac etiology 06/10/2017   CAD S/P percutaneous coronary angioplasty    Abnormal stress test    Paroxysmal atrial fibrillation ([place]) 11/12/2014   Encounter for therapeutic drug monitoring 12/03/2013   PVC's (premature ventricular contractions) 01/26/2012   Tachycardia-bradycardia syndrome ([place]) 01/26/2012   Long term (current) use of anticoagulants 09/06/2011   GERD 08/01/2007   History of colonic polyps 08/01/2007   Personal history of urinary calculi 08/01/2007   BPH with urinary obstruction 08/01/2007   Hyperlipidemia 07/04/2007   Essential hypertension 07/04/2007   ALLERGIC RHINITIS 07/04/2007    Past Surgical History:  Procedure Laterality Date   ATRIAL FIBRILLATION ABLATION N/A 11/12/2014   Procedure: ATRIAL FIBRILLATION ABLATION;  Surgeon: [name], MD;  Location: [facility] CATH LAB;  Service: Cardiovascular;  Laterality: N/A;   BLADDER SURGERY     ruptured blood vessel in the bladder 3 weeks S/P ureter stones removed   CARDIAC CATHETERIZATION N/A 04/06/2016   Procedure: Left Heart Cath and Coronary Angiography;  Surgeon: [name], MD;  Location: [place] CV LAB;  Service: Cardiovascular;  Laterality: N/A;   CARDIAC CATHETERIZATION N/A 04/06/2016   Procedure: Coronary Stent Intervention;  Surgeon: [name], MD;  Location: [place] CV LAB;  Service: Cardiovascular;  Laterality: N/A;   CATARACT EXTRACTION W/ INTRAOCULAR LENS  IMPLANT, BILATERAL Bilateral    COLONOSCOPY  06-29-12   per Dr. [name], adenomatous polyps, repeat in 3 yrs    [place]  04/06/2016   "1 stent"   CYSTOSCOPY/RETROGRADE/URETEROSCOPY/STONE EXTRACTION WITH BASKET Left    removed 2 stones from left ureter   INGUINAL HERNIA REPAIR Right 04/1990    Dr. [name]   LAPAROSCOPIC CHOLECYSTECTOMY  [phone]   Dr. [name]   MELANOMA EXCISION Right    "@ cheek near ear"   TEE WITHOUT CARDIOVERSION N/A 11/11/2014   Procedure: TRANSESOPHAGEAL ECHOCARDIOGRAM (TEE);  Surgeon: [name], MD;  Location: [facility] ENDOSCOPY;  Service: Cardiovascular;  Laterality: N/A;  needs INR before case   VASECTOMY         Family History  Problem Relation Age of Onset   Heart attack Father 6   Heart disease Father    Ovarian cancer Mother     Social History   Tobacco Use   Smoking status: Never   Smokeless tobacco: Never  Vaping Use   Vaping Use: Never used  Substance Use Topics   Alcohol use: No    Alcohol/week: 0.0 standard drinks   Drug use: No    Home Medications Prior to Admission medications   Medication Sig Start Date End Date Taking? Authorizing Provider  atorvastatin (LIPITOR) 80 MG tablet Take 1 tablet (80 mg total) by mouth daily. Patient taking differently: Take 80 mg by mouth at bedtime. 04/19/16  Yes Angelena Form R, PA-C  CLARITIN 10 MG CAPS Take 1 capsule by mouth at bedtime. 08/08/19  Yes [provider]  [employer] ACETAMINOPHEN 325 MG CAPS Take 1 capsule by mouth 2 (two) times daily.  08/08/19  Yes [provider]  diltiazem (CARDIZEM) 30 MG tablet Take 1 tablet every 4 hours AS NEEDED for afib heart rate over 100 08/08/17  Yes Sherran Needs, NP  finasteride (PROSCAR) 5 MG tablet Take 5 mg daily by mouth.   Yes [provider]  furosemide (LASIX) 20 MG tablet Take 1 tab daily as needed for fluid retention. Patient taking differently: Take 20 mg by mouth 2 (two) times daily. Take 1 tab daily as needed for fluid retention. 02/04/21  Yes [name], MD  latanoprost (XALATAN) 0.005 % ophthalmic solution Place 1 drop into both eyes at bedtime.    Yes [provider]  lisinopril (PRINIVIL,ZESTRIL) 10 MG tablet Take 10 mg by mouth at bedtime.    Yes [provider]  metoprolol tartrate  (LOPRESSOR) 12.5 mg TABS tablet Take 12.5 mg by mouth 2 (two) times daily.   Yes [provider]  mometasone (NASONEX) 50 MCG/ACT nasal spray Place 2 sprays into both nostrils daily as needed (allergies).   Yes [provider]  Multiple Vitamins-Minerals (CENTRUM SILVER PO) Take 1 tablet by mouth every morning.   Yes [provider]  pantoprazole (PROTONIX) 40 MG tablet Take 1 tablet (40 mg total) by mouth daily. 04/19/16  Yes Eileen Stanford, PA-C  potassium chloride (KLOR-CON) 10 MEQ CR tablet Take 10 mEq by mouth every morning.   Yes [provider]  tamsulosin (FLOMAX) 0.4 MG CAPS Take 0.4 mg by mouth 2 (two) times daily.    Yes [provider]  TOLAK 4 % CREA Apply 1 application topically daily as needed (rough place on scalp). 08/08/19  Yes [provider]  warfarin (COUMADIN) 5 MG tablet Take 1 to 2 tablets daily as directed by the coumadin clinic Patient taking differently: Take 5 mg by mouth. 7.5 mg on Monday Wednesday and friday 03/02/21  Yes Hochrein, Jeneen Rinks, MD  benzonatate (TESSALON PERLES) 100 MG capsule Take 1 capsule (100 mg total) by mouth 3 (three) times daily as needed. Patient not taking: Reported on 06/09/2021 04/21/21   Lucretia Kern, DO  docusate sodium (COLACE) 100 MG capsule Take 1 capsule (100 mg total) by mouth 2 (two) times daily. Patient not taking: Reported on 06/09/2021 01/30/21   [name], MD  HYDROcodone bit-homatropine [facility]) 5-1.5 MG/5ML syrup Take 5 mLs by mouth every 4 (four) hours as needed. Patient not taking: Reported on 06/09/2021 04/23/21   [name], MD  HYDROcodone-homatropine [facility]) 5-1.5 MG/5ML syrup Take 5 mLs by mouth every 4 (four) hours as needed for cough. Patient not taking: Reported on 06/09/2021 09/23/20   [name], MD    Allergies    Sulfa antibiotics and Sulfamethoxazole  Review of Systems   Review of Systems 10 systems reviewed and negative except as per HPI Physical  Exam Updated Vital Signs BP 139/69   Pulse 62   Temp 98.4 F (36.9 C) (Oral)   Resp (!) 8   SpO2 98%   Physical Exam Constitutional:      Appearance: Normal appearance.  HENT:     Mouth/Throat:     Pharynx: Oropharynx is clear.  Eyes:     Extraocular Movements: Extraocular movements intact.  Cardiovascular:     Rate and Rhythm: Normal rate and regular rhythm.     Pulses: Normal pulses.  Pulmonary:     Comments: No respiratory distress at rest.  Patient does have crackles from the mid lung fields to the bases. Abdominal:     General: There is no distension.     Palpations: Abdomen is soft.     Tenderness: There is no abdominal tenderness. There is no guarding.  Musculoskeletal:     Comments: 2-3+ pitting edema symmetric bilateral ankles and lower legs.  Skin:    General: Skin is warm and dry.  Neurological:     General: No focal deficit present.     Mental Status: He is alert and oriented to person, place, and time.     Motor: No weakness.     Coordination: Coordination normal.  Psychiatric:        Mood and Affect: Mood normal.    ED Results / Procedures / Treatments   Labs (all labs ordered are listed, but only abnormal results are displayed) Labs Reviewed  BASIC METABOLIC PANEL  CBC  BRAIN NATRIURETIC PEPTIDE  PROTIME-INR  TROPONIN I (HIGH SENSITIVITY)    EKG EKG Interpretation  Date/Time:  Tuesday June 09 2021 13:12:42 EDT Ventricular Rate:  76 PR Interval:  156 QRS Duration: 84 QT Interval:  380 QTC Calculation: 427 R Axis:   10 Text Interpretation: Sinus rhythm with Premature supraventricular complexes Low voltage QRS Cannot rule out Anterior infarct , age undetermined Abnormal ECG no acute ischemic change. no sig change from previious Confirmed by Charlesetta Shanks [phone]) on 06/09/2021 2:47:14 PM  Radiology DG Chest 1 View  Result Date: 06/09/2021 CLINICAL DATA:  Shortness of breath. EXAM: CHEST  1 VIEW COMPARISON:  Chest x-ray 04/21/2021 FINDINGS:  The cardiac silhouette, mediastinal  and hilar contours are within normal limits and stable. The lungs are clear of an acute process. No infiltrates or effusions. Some type of clothing artifact is overlying the left lung apex but I do not see any obvious underlying process. No definite pulmonary lesions. The bony thorax is intact. IMPRESSION: No acute cardiopulmonary findings. Electronically Signed   By: Marijo Sanes M.D.   On: 06/09/2021 13:57    Procedures Procedures   Medications Ordered in ED Medications  furosemide (LASIX) injection 40 mg (has no administration in time range)    ED Course  I have reviewed the triage vital signs and the nursing notes.  Pertinent labs & imaging results that were available during my care of the patient were reviewed by me and considered in my medical decision making (see chart for details).    MDM Rules/Calculators/A&P                           Patient presents as outlined.  He has not had chest pain but has had paroxysmal atrial fibrillation has been persistent over the weekend.  It has been briefly resolved and then ultimately resolved on Sunday.  Patient continues to feel very fatigued and short of breath.  Clinically he shows signs of volume overload with crackles on exam and significant peripheral edema.  Will initiate a dose of Lasix and consult cardiology.  At this time, diagnostic results are pending, chest x-ray has been done without gross volume overload.  Patient's respiratory status is stable.  Will transfer to oncoming provider for follow-up on diagnostic results and results of cardiology consultation.  For definitive management Final Clinical Impression(s) / ED Diagnoses Final diagnoses:  Paroxysmal atrial fibrillation ([place])  Acute congestive heart failure, unspecified heart failure type [facility])    Rx / DC Orders ED Discharge Orders     None        Charlesetta Shanks, MD 06/09/21 1452

## 2021-06-09 NOTE — Telephone Encounter (Signed)
Patient is following up to provide an update. He states he is having symptoms again and would like to be seen sooner if possible. States he isn't in afib, but "just isn't feeling good." States he will discuss further when he speaks with RN.

## 2021-06-09 NOTE — ED Provider Notes (Signed)
Emergency Medicine Provider Triage Evaluation Note  [name] [name] , a 87 y.o. male  was evaluated in triage.  Pt complains of generalized weakness and shortness of breath. Had a period of afib for over 24 hours this weekend. Does not feel he is in afib currently. States since this episode he has felt generally weak and is having sob with exertion. Denies chest pain.  Review of Systems  Positive: Sob, palpitations, leg swelling Negative: Chest pain  Physical Exam  BP 140/68 (BP Location: Right Arm)   Pulse 82   Temp 98.4 F (36.9 C) (Oral)   Resp 15   SpO2 97%  Gen:   Awake, no distress   Resp:  Normal effort  MSK:   Moves extremities without difficulty  Other:  Ble edema, heart with rrr    Medical Decision Making  Medically screening exam initiated at 1:14 PM.  Appropriate orders placed.  ZACCARY KITZ was informed that the remainder of the evaluation will be completed by another provider, this initial triage assessment does not replace that evaluation, and the importance of remaining in the ED until their evaluation is complete.     Rodney Booze, [place] 06/09/21 1315    Wyvonnia Dusky, MD 06/09/21 [phone]

## 2021-06-09 NOTE — Telephone Encounter (Signed)
Spoke to patient he stated he feels bad.Stated he is very weak and light headed.No chest pain.No sob.Stated after his episode with afib this past Sunday he has not bounced back like he normally does.Stated he feels like he might have another blockage.He does not want to go to Afib clinic.Appointment scheduled with Coletta Memos NP 8/3 at 3:15 pm at [facility] location.

## 2021-06-09 NOTE — Progress Notes (Deleted)
Cardiology Office Note:    Date:  06/09/2021   ID:  [name] [name], DOB 01-29-1929, MRN [id number]  PCP:  [name], MD   [facility] Providers Cardiologist:  [name], MD { Click to update primary MD,subspecialty MD or APP then REFRESH:1}    Referring MD: [name], MD   Presents to the clinic for evaluation of his weakness and lightheadedness.  History of Present Illness:    [name] [name] is a 87 y.o. male with a hx of coronary artery disease, paroxysmal atrial fibrillation, and essential hypertension.  He was seen in the emergency department 12/18 with PAF.  He was seen and evaluated by Dr. [name] and consider ablation versus amiodarone therapy.  Medical management was pursued.  He was seen by Dr. Percival Spanish on 03/02/2021.  During that time he continued to do well.  He reported that he had an episode of atrial fibrillation while he was exerting himself making his bed.  The episode lasted for about 12-13 hours.  His heart rate went up to around 100 bpm.  Aside from the isolated incident he had done well.  He continued to exercise 4 times per week using a treadmill and denied chest arm and neck discomfort.  He denied shortness of breath, PND and orthopnea.  He did note episodes of constipation.  He underwent CT which showed mild thickening of his large intestine.  He also noted a 10 pound weight loss over the last 6 months.  He was considering colonoscopy.   He contacted the nurse triage line on 06/06/2021 and indicated that he was feeling lightheaded and weak.  He denied chest pain and shortness of breath.  He had noted an episode of atrial fibrillation on the prior Sunday.  He reported that he felt he might have a blockage.  He presented to the emergency department 06/09/21 with complaints of generalized weakness and shortness of breath.  He reported that he had been in atrial fibrillation for over 24 hours during the weekend.  His EKG showed normal sinus rhythm with PVC 76  bpm  He presents the clinic today for evaluation and states***  *** denies chest pain, shortness of breath, lower extremity edema, fatigue, palpitations, melena, hematuria, hemoptysis, diaphoresis, weakness, presyncope, syncope, orthopnea, and PND.   Past Medical History:  Diagnosis Date   Coronary artery disease    a. LHC on 04/06/16 which revealed 40% occl pRCA, 85% occl D1, 75% occl Ramus, 65% occl mLCx, 45% occl pLAD, 90% occl pLAD and nl LV function. s/p PCI/DES to prox LAD. Medical therapy for diag disease.     GERD (gastroesophageal reflux disease)    Glaucoma    Hemorrhoids    internal   HTN (hypertension)    Hx of colonic polyps    tubular adenoma    Hyperlipidemia    Kidney stone    Melanoma of cheek ([place])    Nephrolithiasis    Pancolonic diverticulosis    Paroxysmal atrial fibrillation ([place])    a. s/p ablation 12/2014: on coumadin    Past Surgical History:  Procedure Laterality Date   ATRIAL FIBRILLATION ABLATION N/A 11/12/2014   Procedure: ATRIAL FIBRILLATION ABLATION;  Surgeon: [name], MD;  Location: [facility] CATH LAB;  Service: Cardiovascular;  Laterality: N/A;   BLADDER SURGERY     ruptured blood vessel in the bladder 3 weeks S/P ureter stones removed   CARDIAC CATHETERIZATION N/A 04/06/2016   Procedure: Left Heart Cath and Coronary Angiography;  Surgeon:  [name], MD;  Location: [place] CV LAB;  Service: Cardiovascular;  Laterality: N/A;   CARDIAC CATHETERIZATION N/A 04/06/2016   Procedure: Coronary Stent Intervention;  Surgeon: [name], MD;  Location: [place] CV LAB;  Service: Cardiovascular;  Laterality: N/A;   CATARACT EXTRACTION W/ INTRAOCULAR LENS  IMPLANT, BILATERAL Bilateral    COLONOSCOPY  06-29-12   per Dr. [name], adenomatous polyps, repeat in 3 yrs    [place]  04/06/2016   "1 stent"   CYSTOSCOPY/RETROGRADE/URETEROSCOPY/STONE EXTRACTION WITH BASKET Left    removed 2 stones from left ureter   INGUINAL  HERNIA REPAIR Right 04/1990   Dr. [name]   LAPAROSCOPIC CHOLECYSTECTOMY  [phone]   Dr. [name]   MELANOMA EXCISION Right    "@ cheek near ear"   TEE WITHOUT CARDIOVERSION N/A 11/11/2014   Procedure: TRANSESOPHAGEAL ECHOCARDIOGRAM (TEE);  Surgeon: [name], MD;  Location: [facility] ENDOSCOPY;  Service: Cardiovascular;  Laterality: N/A;  needs INR before case   VASECTOMY      Current Medications: No outpatient medications have been marked as taking for the 06/10/21 encounter (Appointment) with Deberah Pelton, NP.     Allergies:   Sulfa antibiotics and Sulfamethoxazole   Social History   Socioeconomic History   Marital status: Widowed    Spouse name: Not on file   Number of children: 2   Years of education: Not on file   Highest education level: Not on file  Occupational History   Occupation: Retired  Tobacco Use   Smoking status: Never   Smokeless tobacco: Never  Vaping Use   Vaping Use: Never used  Substance and Sexual Activity   Alcohol use: No    Alcohol/week: 0.0 standard drinks   Drug use: No   Sexual activity: Not Currently  Other Topics Concern   Not on file  Social History Narrative   Not on file   Social Determinants of Health   Financial Resource Strain: Low Risk    Difficulty of Paying Living Expenses: Not hard at all  Food Insecurity: No Food Insecurity   Worried About Charity fundraiser in the Last Year: Never true   [place] in the Last Year: Never true  Transportation Needs: No Transportation Needs   Lack of Transportation (Medical): No   Lack of Transportation (Non-Medical): No  Physical Activity: Insufficiently Active   Days of Exercise per Week: 3 days   Minutes of Exercise per Session: 20 min  Stress: No Stress Concern Present   Feeling of Stress : Not at all  Social Connections: Moderately Isolated   Frequency of Communication with Friends and Family: More than three times a week   Frequency of Social Gatherings with Friends and  Family: Three times a week   Attends Religious Services: More than 4 times per year   Active Member of Clubs or Organizations: No   Attends Archivist Meetings: Never   Marital Status: Widowed     Family History: The patient's ***family history includes Heart attack (age of onset: 63) in his father; Heart disease in his father; Ovarian cancer in his mother.  ROS:   Please see the history of present illness.    *** All other systems reviewed and are negative.   Risk Assessment/Calculations:   {Does this patient have ATRIAL FIBRILLATION?:[phone]}       Physical Exam:    VS:  There were no vitals taken for this visit.  Wt Readings from Last 3 Encounters:  04/03/21 189 lb (85.7 kg)  03/18/21 189 lb (85.7 kg)  03/02/21 188 lb 12.8 oz (85.6 kg)     GEN: *** Well nourished, well developed in no acute distress HEENT: Normal NECK: No JVD; No carotid bruits LYMPHATICS: No lymphadenopathy CARDIAC: ***RRR, no murmurs, rubs, gallops RESPIRATORY:  Clear to auscultation without rales, wheezing or rhonchi  ABDOMEN: Soft, non-tender, non-distended MUSCULOSKELETAL:  No edema; No deformity  SKIN: Warm and dry NEUROLOGIC:  Alert and oriented x 3 PSYCHIATRIC:  Normal affect    EKGs/Labs/Other Studies Reviewed:    The following studies were reviewed today: Nuclear stress test 06/16/2017 The left ventricular ejection fraction is normal (55-65%). Nuclear stress EF: 56%. Blood pressure demonstrated a hypertensive response to exercise. Horizontal ST segment depression ST segment depression of 1.5 mm was noted during stress in the II, III and V6 leads, beginning at 4 minutes of stress, and returning to baseline after 1-5 minutes of recovery. The study is normal. This is a low risk study.   Normal exercise nuclear stress test with no evidence for prior infarct or ischemia. There are ST depressions during exercise however no perfusion defect. Excellent exercise capacity for  age. Hypertensive BP response to stress.  EKG:  EKG is *** ordered today.  The ekg ordered today demonstrates ***  Recent Labs: 09/15/2020: ALT 34; TSH 1.93 02/24/2021: BUN 20; Creatinine, Ser 1.09; Hemoglobin 13.8; Platelets 195.0; Potassium 4.4; Sodium 139  Recent Lipid Panel    Component Value Date/Time   CHOL 143 09/15/2020 0934   TRIG 135 09/15/2020 0934   HDL 47 09/15/2020 0934   CHOLHDL 3.0 09/15/2020 0934   VLDL 31.6 09/05/2019 1012   LDLCALC 74 09/15/2020 0934    ASSESSMENT & PLAN    Weakness/fatigue/DOE-reports improved energy today.  Appears to be related to episodes of atrial fibrillation.  Reports stable weight and dietary habits.  Appears to be related to episode of atrial fibrillation. Order echocardiogram Maintain heart healthy diet  Paroxysmal atrial fibrillation-heart rate today***.  Has been having intermittent episodes of atrial fibrillation.  Reports compliance with his metoprolol and warfarin.  Denies bleeding issues Continue metoprolol, warfarin,  Increase diltiazem*** Heart healthy low-sodium diet-salty 6 given Increase physical activity as tolerated Avoid triggers caffeine, chocolate, EtOH, dehydration etc.  Essential hypertension-BP today***.  Well-controlled at home. Continue diltiazem, furosemide, metoprolol Heart healthy low-sodium diet-salty 6 given Increase physical activity as tolerated  Hyperlipidemia-LDL 74 on 09/15/2020 Continue atorvastatin Heart healthy low-sodium high-fiber diet Increase physical activity as tolerated  Disposition: Follow-up with Dr. Percival Spanish or me in 1-2 months.  {Are you ordering a CV Procedure (e.g. stress test, cath, DCCV, TEE, etc)?   Press F2        :[id number]    Medication Adjustments/Labs and Tests Ordered: Current medicines are reviewed at length with the patient today.  Concerns regarding medicines are outlined above.  No orders of the defined types were placed in this encounter.  No orders of the  defined types were placed in this encounter.   There are no Patient Instructions on file for this visit.   Signed, Deberah Pelton, NP  06/09/2021 1:19 PM      Notice: This dictation was prepared with Dragon dictation along with smaller phrase technology. Any transcriptional errors that result from this process are unintentional and may not be corrected upon review.  I spent***minutes examining this patient, reviewing medications, and using patient centered shared decision making involving her cardiac care.  Prior to her visit I spent greater than 20 minutes reviewing her past medical history,  medications, and prior cardiac tests.

## 2021-06-10 ENCOUNTER — Observation Stay (HOSPITAL_BASED_OUTPATIENT_CLINIC_OR_DEPARTMENT_OTHER): Payer: Medicare Other

## 2021-06-10 ENCOUNTER — Encounter (HOSPITAL_BASED_OUTPATIENT_CLINIC_OR_DEPARTMENT_OTHER): Payer: Self-pay

## 2021-06-10 ENCOUNTER — Ambulatory Visit (HOSPITAL_BASED_OUTPATIENT_CLINIC_OR_DEPARTMENT_OTHER): Payer: Medicare Other | Admitting: General Practice

## 2021-06-10 DIAGNOSIS — I5031 Acute diastolic (congestive) heart failure: Secondary | ICD-10-CM | POA: Diagnosis not present

## 2021-06-10 DIAGNOSIS — I480 Paroxysmal atrial fibrillation: Secondary | ICD-10-CM | POA: Diagnosis not present

## 2021-06-10 LAB — LIPID PANEL
Cholesterol: 123 mg/dL (ref 0–200)
HDL: 44 mg/dL (ref >40)
LDL Cholesterol: 59 mg/dL (ref 0–99)
Total CHOL/HDL Ratio: 2.8 RATIO
Triglycerides: 99 mg/dL (ref <150)
VLDL: 20 mg/dL (ref 0–40)

## 2021-06-10 LAB — BASIC METABOLIC PANEL
Anion gap: 8 (ref 5–15)
BUN: 20 mg/dL (ref 8–23)
CO2: 32 mmol/L (ref 22–32)
Calcium: 9.4 mg/dL (ref 8.9–10.3)
Chloride: 101 mmol/L (ref 98–111)
Creatinine, Ser: 1.14 mg/dL (ref 0.61–1.24)
GFR, Estimated: >60 mL/min (ref >60)
Glucose, Bld: 113 mg/dL — ABNORMAL HIGH (ref 70–99)
Potassium: 3.9 mmol/L (ref 3.5–5.1)
Sodium: 141 mmol/L (ref 135–145)

## 2021-06-10 LAB — CBC
HCT: 39.8 % (ref 39.0–52.0)
Hemoglobin: 13.2 g/dL (ref 13.0–17.0)
MCH: 32.8 pg (ref 26.0–34.0)
MCHC: 33.2 g/dL (ref 30.0–36.0)
MCV: 99 fL (ref 80.0–100.0)
Platelets: 178 10*3/uL (ref 150–400)
RBC: 4.02 MIL/uL — ABNORMAL LOW (ref 4.22–5.81)
RDW: 13.5 % (ref 11.5–15.5)
WBC: 11.4 10*3/uL — ABNORMAL HIGH (ref 4.0–10.5)
nRBC: 0 % (ref 0.0–0.2)

## 2021-06-10 LAB — TSH: TSH: 2.254 u[IU]/mL (ref 0.350–4.500)

## 2021-06-10 LAB — LACTIC ACID, PLASMA: Lactic Acid, Venous: 1.2 mmol/L (ref 0.5–1.9)

## 2021-06-10 LAB — C-REACTIVE PROTEIN: CRP: 0.5 mg/dL (ref <1.0)

## 2021-06-10 LAB — FERRITIN: Ferritin: 42 ng/mL (ref 24–336)

## 2021-06-10 LAB — ECHOCARDIOGRAM COMPLETE
Area-P 1/2: 2.56 cm2
Height: 71 in
S' Lateral: 2.6 cm
Weight: 2894.4 oz

## 2021-06-10 LAB — PROTIME-INR
INR: 2.5 — ABNORMAL HIGH (ref 0.8–1.2)
Prothrombin Time: 27.1 seconds — ABNORMAL HIGH (ref 11.4–15.2)

## 2021-06-10 LAB — LACTATE DEHYDROGENASE: LDH: 161 U/L (ref 98–192)

## 2021-06-10 MED ORDER — FUROSEMIDE 40 MG PO TABS: 40.0000 mg | ORAL_TABLET | Freq: Every day | ORAL | 2 refills | Status: DC | PRN

## 2021-06-10 MED ORDER — OFF THE BEAT BOOK
Freq: Once | Status: AC
  Filled 2021-06-10: qty 1

## 2021-06-10 NOTE — Progress Notes (Signed)
  Echocardiogram 2D Echocardiogram has been performed.  [name] [name] 06/10/2021, 3:44 PM

## 2021-06-10 NOTE — Progress Notes (Signed)
[facility] for Warfarin Indication: atrial fibrillation  Allergies  Allergen Reactions   Sulfa Antibiotics Other (See Comments)    Severe peeling of the skin on the groin area   Sulfamethoxazole Other (See Comments)    REACTION: rash    Vital Signs: Temp: 97.8 F (36.6 C) (08/03 0757) Temp Source: Oral (08/03 0757) BP: 149/64 (08/03 1040) Pulse Rate: 67 (08/03 1040)  Labs: Recent Labs    06/09/21 1316 06/09/21 1600 06/10/21 0144  HGB 13.2  --  13.2  HCT 40.4  --  39.8  PLT 200  --  178  LABPROT 30.1*  --  27.1*  INR 2.9*  --  2.5*  CREATININE 1.09  --  1.14  TROPONINIHS 4 4  --      Estimated Creatinine Clearance: 46.8 mL/min (by C-G formula based on SCr of 1.14 mg/dL).   Medical History: Past Medical History:  Diagnosis Date   Coronary artery disease    a. LHC on 04/06/16 which revealed 40% occl pRCA, 85% occl D1, 75% occl Ramus, 65% occl mLCx, 45% occl pLAD, 90% occl pLAD and nl LV function. s/p PCI/DES to prox LAD. Medical therapy for diag disease.     GERD (gastroesophageal reflux disease)    Glaucoma    Hemorrhoids    internal   HTN (hypertension)    Hx of colonic polyps    tubular adenoma    Hyperlipidemia    Kidney stone    Melanoma of cheek ([place])    Nephrolithiasis    Pancolonic diverticulosis    Paroxysmal atrial fibrillation ([place])    a. s/p ablation 12/2014: on coumadin    Assessment: 87 YO male presenting with fatigue, generalized weakness, SOB with exertion and found to be in paroxysmal atrial fibrillation with RVR. PTA warfarin dose is 7.5 mg MWF and 5 mg all other days.  -INR= 2.3   Goal of Therapy:  INR 2-3 Monitor platelets by anticoagulation protocol: Yes   Plan:  Will continue home regimen as above Daily PT/INR for now  Hildred Laser, PharmD Clinical Pharmacist **Pharmacist phone directory can now be found on [email or web address] ([email or web address]).  Listed under [place].

## 2021-06-10 NOTE — Plan of Care (Signed)

## 2021-06-10 NOTE — Discharge Summary (Addendum)
Discharge Summary    Patient ID: [name] [name] MRN: [id number]; DOB: 02-Feb-1929  Admit date: 06/09/2021 Discharge date: 06/10/2021  PCP:  [name], MD   [facility] Providers Cardiologist:  [name], MD   {    Discharge Diagnoses    Principal Problem:   Atrial fibrillation [facility]) Active Problems:   Hyperlipidemia   Essential hypertension   GERD   BPH with urinary obstruction   PAF (paroxysmal atrial fibrillation) ([place])   CAD S/P percutaneous coronary angioplasty    Diagnostic Studies/Procedures    Echo from 06/10/21:    1. Left ventricular ejection fraction, by estimation, is 60 to 65%. The  left ventricle has normal function. The left ventricle has no regional  wall motion abnormalities. Left ventricular diastolic parameters are  consistent with Grade I diastolic  dysfunction (impaired relaxation).   2. Right ventricular systolic function is normal. The right ventricular  size is normal. There is normal pulmonary artery systolic pressure.   3. The mitral valve is normal in structure. No evidence of mitral valve  regurgitation. No evidence of mitral stenosis.   4. The aortic valve is normal in structure. Aortic valve regurgitation is  trivial. No aortic stenosis is present.   5. The inferior vena cava is normal in size with greater than 50%  respiratory variability, suggesting right atrial pressure of 3 mmHg.   _____________   History of Present Illness     [name] [name] follows Dr. Percival Spanish outpatient for CAD, hypertension, and paroxysmal A. Fib.   He was initially diagnosed with paroxysmal A. fib RVR in 2008, did not tolerate flecainide due to dizziness/imbalance/bradycardia, has been anticoagulated on Coumadin, underwent cardiac ablation on 11/12/2014 by Dr. [name].  Unfortunately he had a recurrence of atrial fibrillation 03/2016 with worsening fatigue.  Recurrence was very few and he was maintained on metoprolol. He had near syncope in 06/2017, found to  be bradycardic, metoprolol dose was reduced. He had no further episodes on follow up. Later he went to ED in Dec 2018 for long lasting persistent A fib, was seen by [name], reluctant to re-consider ablation due to hx of groin hematoma after prior procedure, not interested in hospitalization for tikosyn loading, worried about cost of multaq, and eventually decided to maintain medical therapy medical therapy.    He was also concerned for having anginal equivalent symptoms on 03/2016 when he c/o fatigue, had a POET completed on 04/01/16 which showed 2 to 3 mm ST depression in inferior leads suggesting ischemia.  He had subsequent left heart catheterization on 04/06/2016 which revealed 40% stenosis pRCA, 85% stenosis D1, 75% stenosis of ramus, 65% stenosis mLCx, 45% stenosis pLAD, 90%stenosis pLAD, normal LV function. He was treated with PCI /DES to pLAD. He was recommended medical therapy for remaining disease with ASA, plavix, coumadin, BB, lipitor.  Last stress Myoview from 06/16/2017 was low risk study and no evidence of ischemia.   He was last seen in the office on 03/02/21 for follow up, had one symptomatic paroxysmal A. fib episode that was not bothersome and self-limited.  He was maintained on beta-blocker, as needed Cardizem, and Coumadin for anticoagulation, did not want to switch to a DOAC.  He had no angina symptoms.   On 06/06/2021, patient's granddaughter called office reporting that patient had heart rate of 1 30-1 50s since noon the day before.  He was asymptomatic, no syncope or near syncope episode, was felt a little unsteady after taking Cardizem with blood pressure 107/80.  He had a subsequent heart rate improvement to 70s while on the phone, he was advised taking additional Cardizem if heart rate becomes elevated above 70, after was made to make early follow-up appointment with Dr. Percival Spanish.   He presented to the ER 06/09/21 complaining generalized weakness, mild shortness of breath, heart  palpitation.  He reported remained in atrial fibrillation with HR >140s  > 24 hours this past weekend.  He had taken Cardizem every 4 hours on Saturday, a fib appeared converted but subsequently reoccurred on Sunday.  He felt grossly fatigued and weak without improvement on Monday.  He stated that he usually feels much better when he is out of atrial fibrillation.  He stated his A fib episode does not last this long (>20 hours), and he seemed remained in A fib the whole weekend. He had some leg swelling that started today, he did not pay too much attention to it. He stated he is feeling better now as his HR is controlled. His son stated his leg swelling is already better after getting Lasix at ED. He had urinated a lot. He denied any orthopnea, dizziness, chest pain, chest pressure, syncope. He was still not interested with ablation.      Diagnostic work-up at admission including CBC revealed hyperglycemia 159, otherwise unremarkable.  High sensitive troponin negative x1.  BNP 35.1.  CBC unremarkable.  INR 2.9.  Chest x-ray revealed no acute cardiopulmonary finding.  EKG revealed sinus rhythm with ventricular rate of 76 bpm, low voltage QRS, occasional PACs, no acute change.  Cardiology is asked to evaluate the patient.    Hospital Course     Consultants: N/A  Paroxysmal atrial fibrillation with RVR -Presented with fatigue, generalized weakness, shortness of breath with exertion -Has been in and out of atrial fibrillation since Saturday, heart rate was up to 140s on 06/06/2021, duration appears >20 hours long, rate control appears achieved with p.o. metoprolol and as needed diltiazem -Patient remained in sinus rhythm since admission, feeling improved -CHA2DS2-VASc score 4, continued on anticoagulation with Coumadin, INR is therapeutic, he does not wish to switch to DOAC - TSH, magnesium WNL  - Echo showed EF 60-65%, grade I DD - Given single event of long lasting A fib with RVR this weekend without  recurrence so far, patient had opted to continue current medical therapy and monitor for recurrence, shall A fib burden is high in the future, may consider start Amiodarone  - Follow up with cardiology arranged on [id number]    Acute diastolic heart failure  -Presented with fatigue, generalized weakness, shortness of breath, leg swelling -BNP WNL -Chest x-ray revealed no acute finding -Suspecting tachycardia induced CM + diastolic dysfunction - Echo showed EF 60-65%, grade I DD -Clinically euvolemic, improved with IV lasix  '40mg'$  x1 at ED, UOP 2625m over the past 24 hours, will discharge on PRN lasix '40mg'$  daily when increased SOB, orthopnea, peripheral edema, or weight gain >5 pounds in 1 week, discussed with the patient     CAD with history of PCI/DES to proximal LAD 2017 -High sensitive troponin negative x1 -EKG without acute ischemic change -Symptoms likely from poorly controlled atrial fibrillation and possible CHF, no chest pain  -Stress Myoview negative from 06/16/2017 -No ischemic work-up is indicated at this time -Continue medical therapy with aspirin, Lipitor, metoprolol, lisinopril  COVID-19 infection  - incidentally positive on screen , self reports had COVID -19 7 weeks ago, with some sore throat and fever, no longer symptomatic  - not hypoxic  -  CXR without PNA  - unlikely contributing at this time    Hypertension -BP is fairly controlled for his age -Continue metoprolol and lisinopril   Hyperlipidemia -On Lipitor 80 mg daily, last lipid panel is from 11/8/202, LDL 74, lipid updated    BPH -Resume home meds Flomax and finasteride    Did the patient have an acute coronary syndrome (MI, NSTEMI, STEMI, etc) this admission?:  No                               Did the patient have a percutaneous coronary intervention (stent / angioplasty)?:  No.       _____________  Discharge Vitals Blood pressure 114/61, pulse 74, temperature 98.2 F (36.8 C), temperature source Oral,  resp. rate 18, height '5\' 11"'$  (1.803 m), weight 82.1 kg, SpO2 97 %.  Filed Weights   06/09/21 2153 06/09/21 2235 06/10/21 0334  Weight: 81.6 kg 82.6 kg 82.1 kg   Vitals:  Vitals:   06/10/21 1148 06/10/21 1543  BP: 123/68 114/61  Pulse: 64 74  Resp: 16 18  Temp: 98 F (36.7 C) 98.2 F (36.8 C)  SpO2: 96% 97%   General Appearance: In no apparent distress, laying in bed HEENT: Normocephalic, atraumatic. EOMs intact.  Neck: Supple, trachea midline, no JVDs, Cardiovascular: Regular rate and rhythm, normal S1-S2,  no murmur/rub/gallop Respiratory: Resting breathing unlabored, lungs sounds clear to auscultation bilaterally, no use of accessory muscles. On room air.  No wheezes, rales or rhonchi.   Gastrointestinal: Bowel sounds positive, abdomen soft, non-tender, non-distended. Extremities: Able to move all extremities in bed without difficulty, chronic ankle edema per patient unchanged from baseline, leg edema improved  Genitourinary: genital exam not performed Musculoskeletal: Normal muscle bulk and tone Skin: Intact, warm, dry. No rashes or petechiae noted in exposed areas.  Neurologic: Alert, oriented to person, place and time. Fluent speech, no gross focal neuro deficit Psychiatric: Normal affect. Mood is appropriate.    Labs & Radiologic Studies    CBC Recent Labs    06/09/21 1316 06/10/21 0144  WBC 8.7 11.4*  HGB 13.2 13.2  HCT 40.4 39.8  MCV 100.7* 99.0  PLT 200 0000000   Basic Metabolic Panel Recent Labs    06/09/21 1316 06/09/21 1600 06/10/21 0144  NA 137  --  141  K 4.0  --  3.9  CL 103  --  101  CO2 28  --  32  GLUCOSE 159*  --  113*  BUN 19  --  20  CREATININE 1.09  --  1.14  CALCIUM 9.3  --  9.4  MG  --  2.2  --    Liver Function Tests No results for input(s): AST, ALT, ALKPHOS, BILITOT, PROT, ALBUMIN in the last 72 hours. No results for input(s): LIPASE, AMYLASE in the last 72 hours. High Sensitivity Troponin:   Recent Labs  Lab 06/09/21 1316  06/09/21 1600  TROPONINIHS 4 4    BNP Invalid input(s): POCBNP D-Dimer No results for input(s): DDIMER in the last 72 hours. Hemoglobin A1C No results for input(s): HGBA1C in the last 72 hours. Fasting Lipid Panel Recent Labs    06/10/21 0144  CHOL 123  HDL 44  LDLCALC 59  TRIG 99  CHOLHDL 2.8   Thyroid Function Tests Recent Labs    06/10/21 0144  TSH 2.254   _____________  DG Chest 1 View  Result Date: 06/09/2021 CLINICAL DATA:  Shortness of  breath. EXAM: CHEST  1 VIEW COMPARISON:  Chest x-ray 04/21/2021 FINDINGS: The cardiac silhouette, mediastinal and hilar contours are within normal limits and stable. The lungs are clear of an acute process. No infiltrates or effusions. Some type of clothing artifact is overlying the left lung apex but I do not see any obvious underlying process. No definite pulmonary lesions. The bony thorax is intact. IMPRESSION: No acute cardiopulmonary findings. Electronically Signed   By: Marijo Sanes M.D.   On: 06/09/2021 13:57   ECHOCARDIOGRAM COMPLETE  Result Date: 06/10/2021    ECHOCARDIOGRAM REPORT   Patient Name:   [name] [name] Date of Exam: 06/10/2021 Medical Rec #:  [id number]      Height:       71.0 in Accession #:    [id number]     Weight:       180.9 lb Date of Birth:  22-May-1929      BSA:          2.021 m Patient Age:    65 years       BP:           133/79 mmHg Patient Gender: M              HR:           59 bpm. Exam Location:  Inpatient Procedure: 2D Echo, Cardiac Doppler and Color Doppler Indications:    CHF  History:        Patient has prior history of Echocardiogram examinations, most                 recent 11/11/2014. CAD, Arrythmias:Atrial Fibrillation; Risk                 Factors:Hypertension and Dyslipidemia.  Sonographer:    Bernadene Person RDCS Referring Phys: [id number] Margie Billet IMPRESSIONS  1. Left ventricular ejection fraction, by estimation, is 60 to 65%. The left ventricle has normal function. The left ventricle has no regional wall  motion abnormalities. Left ventricular diastolic parameters are consistent with Grade I diastolic dysfunction (impaired relaxation).  2. Right ventricular systolic function is normal. The right ventricular size is normal. There is normal pulmonary artery systolic pressure.  3. The mitral valve is normal in structure. No evidence of mitral valve regurgitation. No evidence of mitral stenosis.  4. The aortic valve is normal in structure. Aortic valve regurgitation is trivial. No aortic stenosis is present.  5. The inferior vena cava is normal in size with greater than 50% respiratory variability, suggesting right atrial pressure of 3 mmHg. FINDINGS  Left Ventricle: Left ventricular ejection fraction, by estimation, is 60 to 65%. The left ventricle has normal function. The left ventricle has no regional wall motion abnormalities. The left ventricular internal cavity size was normal in size. There is  no left ventricular hypertrophy. Left ventricular diastolic parameters are consistent with Grade I diastolic dysfunction (impaired relaxation). Right Ventricle: The right ventricular size is normal. No increase in right ventricular wall thickness. Right ventricular systolic function is normal. There is normal pulmonary artery systolic pressure. The tricuspid regurgitant velocity is 2.75 m/s, and  with an assumed right atrial pressure of 3 mmHg, the estimated right ventricular systolic pressure is [id number] mmHg. Left Atrium: Left atrial size was normal in size. Right Atrium: Right atrial size was normal in size. Pericardium: There is no evidence of pericardial effusion. Mitral Valve: The mitral valve is normal in structure. Mild mitral annular calcification. No evidence of mitral valve regurgitation. No evidence  of mitral valve stenosis. Tricuspid Valve: The tricuspid valve is normal in structure. Tricuspid valve regurgitation is not demonstrated. No evidence of tricuspid stenosis. Aortic Valve: The aortic valve is normal in  structure. Aortic valve regurgitation is trivial. No aortic stenosis is present. Pulmonic Valve: The pulmonic valve was normal in structure. Pulmonic valve regurgitation is not visualized. No evidence of pulmonic stenosis. Aorta: The aortic root is normal in size and structure. Venous: The inferior vena cava is normal in size with greater than 50% respiratory variability, suggesting right atrial pressure of 3 mmHg. IAS/Shunts: No atrial level shunt detected by color flow Doppler.  LEFT VENTRICLE PLAX 2D LVIDd:         5.00 cm  Diastology LVIDs:         2.60 cm  LV e' medial:    9.46 cm/s LV PW:         0.90 cm  LV E/e' medial:  8.5 LV IVS:        0.90 cm  LV e' lateral:   11.10 cm/s LVOT diam:     2.10 cm  LV E/e' lateral: 7.2 LV SV:         87 LV SV Index:   43 LVOT Area:     3.46 cm  RIGHT VENTRICLE RV S prime:     16.00 cm/s TAPSE (M-mode): 2.5 cm LEFT ATRIUM             Index       RIGHT ATRIUM           Index LA diam:        3.20 cm 1.58 cm/m  RA Area:     12.60 cm LA Vol (A2C):   31.2 ml 15.44 ml/m RA Volume:   24.60 ml  12.18 ml/m LA Vol (A4C):   27.6 ml 13.66 ml/m LA Biplane Vol: 29.4 ml 14.55 ml/m  AORTIC VALVE LVOT Vmax:   128.00 cm/s LVOT Vmean:  85.700 cm/s LVOT VTI:    0.252 m  AORTA Ao Root diam: 3.90 cm Ao Asc diam:  4.00 cm MITRAL VALVE                TRICUSPID VALVE MV Area (PHT): 2.56 cm     TR Peak grad:   30.2 mmHg MV Decel Time: 296 msec     TR Vmax:        275.00 cm/s MV E velocity: 80.40 cm/s MV A velocity: 102.00 cm/s  SHUNTS MV E/A ratio:  0.79         Systemic VTI:  0.25 m                             Systemic Diam: 2.10 cm Candee Furbish MD Electronically signed by Candee Furbish MD Signature Date/Time: 06/10/2021/4:07:17 PM    Final    Disposition   Patient states he is feeling much improved, denied any SOB, reports improved fatigue and weakness. He is agreeable discharge home. Pt is being discharged home today in good condition.  Follow-up Plans & Appointments     Follow-up  Information     [name], MD Follow up on 07/01/2021.   Specialty: Cardiology Why: at 8:30AM for your post hospital follow up appt with cardiology Contact information: [address] [place] [address] [phone]                Discharge Instructions     Amb  referral to AFIB Clinic   Complete by: As directed    Diet - low sodium heart healthy   Complete by: As directed    Discharge instructions   Complete by: As directed    Follow up with cardiology on 07/01/21 as scheduled and coumadin clinic as scheduled  Monitor your blood pressure and heart rate at home, bring logs to cardiology office for review   Increase activity slowly   Complete by: As directed        Discharge Medications   Allergies as of 06/10/2021       Reactions   Sulfa Antibiotics Other (See Comments)   Severe peeling of the skin on the groin area   Sulfamethoxazole Other (See Comments)   REACTION: rash        Medication List     STOP taking these medications    benzonatate 100 MG capsule Commonly known as: Tessalon Perles   docusate sodium 100 MG capsule Commonly known as: Colace   HYDROcodone bit-homatropine 5-1.5 MG/5ML syrup Commonly known as: HYCODAN   HYDROcodone-homatropine 5-1.5 MG/5ML syrup Commonly known as: HYCODAN       TAKE these medications    atorvastatin 80 MG tablet Commonly known as: LIPITOR Take 1 tablet (80 mg total) by mouth daily. What changed: when to take this   CENTRUM SILVER PO Take 1 tablet by mouth every morning.   Claritin 10 MG Caps Generic drug: Loratadine Take 1 capsule by mouth at bedtime.   [employer] Acetaminophen 325 MG Caps Generic drug: Acetaminophen Take 1 capsule by mouth 2 (two) times daily.   diltiazem 30 MG tablet Commonly known as: Cardizem Take 1 tablet every 4 hours AS NEEDED for afib heart rate over 100   finasteride 5 MG tablet Commonly known as: PROSCAR Take 5 mg daily by mouth.   furosemide 40 MG  tablet Commonly known as: Lasix Take 1 tablet (40 mg total) by mouth daily as needed for fluid (increased shortness of breath, leg edema, weight gain > 5 pounds in 1 week). What changed:  medication strength how much to take how to take this when to take this reasons to take this additional instructions   latanoprost 0.005 % ophthalmic solution Commonly known as: XALATAN Place 1 drop into both eyes at bedtime.   lisinopril 10 MG tablet Commonly known as: ZESTRIL Take 10 mg by mouth at bedtime.   metoprolol tartrate 12.5 mg Tabs tablet Commonly known as: LOPRESSOR Take 12.5 mg by mouth 2 (two) times daily.   mometasone 50 MCG/ACT nasal spray Commonly known as: NASONEX Place 2 sprays into both nostrils daily as needed (allergies).   pantoprazole 40 MG tablet Commonly known as: PROTONIX Take 1 tablet (40 mg total) by mouth daily.   potassium chloride 10 MEQ CR tablet Commonly known as: KLOR-CON Take 10 mEq by mouth every morning.   tamsulosin 0.4 MG Caps capsule Commonly known as: FLOMAX Take 0.4 mg by mouth 2 (two) times daily.   [place] 4 % Crea Generic drug: Fluorouracil Apply 1 application topically daily as needed (rough place on scalp).   warfarin 5 MG tablet Commonly known as: COUMADIN Take as directed. If you are unsure how to take this medication, talk to your nurse or doctor. Original instructions: Take 1 to 2 tablets daily as directed by the coumadin clinic What changed:  how much to take how to take this additional instructions           Outstanding Labs/Studies   N/A  Duration of Discharge Encounter   Greater than 30 minutes including physician time.  Signed, Margie Billet, NP 06/10/2021, 5:01 PM   History and all data above reviewed.  Patient examined.  I agree with the findings as above.   Breathing better.  No pain or weakness.  Leg swelling is reduced.  Ambulated to bathroom.  No recurrent atrial fib.  The patient exam reveals [id number]   ,  Lungs: Clear  ,  Abd: Positive bowel sounds, no rebound no guarding, Ext   Trace edema  .  All available labs, radiology testing, previous records reviewed. Agree with documented assessment and plan.   Atrial fib:  No recurrence.  No change in meds.     Acute HF:  Echo pending.  No change in meds anticipated.  Home after this study.   Margie Billet  5:01 PM  06/10/2021

## 2021-06-15 ENCOUNTER — Other Ambulatory Visit: Payer: Self-pay

## 2021-06-15 ENCOUNTER — Ambulatory Visit (INDEPENDENT_AMBULATORY_CARE_PROVIDER_SITE_OTHER): Payer: Medicare Other | Admitting: *Deleted

## 2021-06-15 ENCOUNTER — Encounter: Payer: Self-pay | Admitting: Family Medicine

## 2021-06-15 DIAGNOSIS — I480 Paroxysmal atrial fibrillation: Secondary | ICD-10-CM

## 2021-06-15 DIAGNOSIS — Z5181 Encounter for therapeutic drug level monitoring: Secondary | ICD-10-CM | POA: Diagnosis not present

## 2021-06-15 LAB — POCT INR: INR: 2.8 (ref 2.0–3.0)

## 2021-06-15 NOTE — Patient Instructions (Signed)
Description   Continue taking Warfarin 1 tablet daily except for 1.5 tablets on Mondays, Wednesdays, and Fridays.  Recheck INR in 4 weeks. Call with any questions or concerns [phone]

## 2021-06-15 NOTE — Telephone Encounter (Signed)
I reviewed the data from his hospital stay. He definitely does NOT have congestive heart failure. There is a lab to check for this (BNP) which was normal, and also the ECHO showed his heart is working well. His fatigue and fluid retention was due to the atrial fibrillation he had. This temporarily affected his heart so he had symptoms like CHF, but now these are gone. The fatigue should get better over time, maybe severla more weeks.

## 2021-06-19 ENCOUNTER — Telehealth: Payer: Self-pay | Admitting: Pharmacist

## 2021-06-19 NOTE — Chronic Care Management (AMB) (Signed)
Chronic Care Management Pharmacy Assistant   Name: [name] [name]  MRN: [id number] DOB: 1929/08/21  Reason for Encounter: Disease State Hypertension Assessment Call   Conditions to be addressed/monitored: HTN  Recent office visits:  04-21-2021 [name] Kern, DO - Patient presented for Nasal congestion and other concerns via tele health. Prescribed Benzonatate 100 mg PRN.  Recent consult visits:  06-15-2021 Anti- coag visit (Cardiology) - Patient presented for INR (2.8)   05-22-21 Anti- coag visit (Cardiology) - Patient presented for INR (1.9) Warfarin adjusted.  05-01-2021 Anti- coag visit (Cardiology) - Patient presented for INR (1.7) Warfarin adjusted.   Hospital visits:  Medication Reconciliation was completed by comparing discharge summary, patient's EMR and Pharmacy list, and upon discussion with patient.  Patient presented to [facility] on 06-09-2021 due to Atrial fibrillation. He was there for 28 hours.  New?Medications Started at [facility] Discharge:?? -started None   Medication Changes at Hospital Discharge: -Changed  Atorvastatin to take at night Furosemide to PRN Warfarin to 5 mg  Medications Discontinued at Hospital Discharge: -Stopped  Benzonatate 100 mg   Hydrocodone bit-homatropine Hydrocodone-homatropine  Medications that remain the same after Hospital Discharge:??  -All other medications will remain the same.      Medication Reconciliation was completed by comparing discharge summary, patient's EMR and Pharmacy list, and upon discussion with patient.  Patient presented to [facility] on 04-21-2021 due to Cough and Positive COVID. He was there for 1 hour.  New?Medications Started at [facility] Discharge:?? -started Nirmatrelvir-Ritonavir 10 x 150 mg & 10 x 100 mg for 5 days  Medication Changes at Hospital Discharge: -Changed  None  Medications Discontinued at Hospital Discharge: -Stopped  None  Medications that remain the  same after Hospital Discharge:??  -All other medications will remain the same.     Medications: Outpatient Encounter Medications as of 06/19/2021  Medication Sig   atorvastatin (LIPITOR) 80 MG tablet Take 1 tablet (80 mg total) by mouth daily.   CLARITIN 10 MG CAPS Take 1 capsule by mouth at bedtime.   [employer] ACETAMINOPHEN 325 MG CAPS Take 1 capsule by mouth 2 (two) times daily.    diltiazem (CARDIZEM) 30 MG tablet Take 1 tablet every 4 hours AS NEEDED for afib heart rate over 100   finasteride (PROSCAR) 5 MG tablet Take 5 mg daily by mouth.   furosemide (LASIX) 40 MG tablet Take 1 tablet (40 mg total) by mouth daily as needed for fluid (increased shortness of breath, leg edema, weight gain > 5 pounds in 1 week).   latanoprost (XALATAN) 0.005 % ophthalmic solution Place 1 drop into both eyes at bedtime.    lisinopril (PRINIVIL,ZESTRIL) 10 MG tablet Take 10 mg by mouth at bedtime.    metoprolol tartrate (LOPRESSOR) 12.5 mg TABS tablet Take 12.5 mg by mouth 2 (two) times daily.   mometasone (NASONEX) 50 MCG/ACT nasal spray Place 2 sprays into both nostrils daily as needed (allergies).   Multiple Vitamins-Minerals (CENTRUM SILVER PO) Take 1 tablet by mouth every morning.   pantoprazole (PROTONIX) 40 MG tablet Take 1 tablet (40 mg total) by mouth daily.   potassium chloride (KLOR-CON) 10 MEQ CR tablet Take 10 mEq by mouth every morning.   tamsulosin (FLOMAX) 0.4 MG CAPS Take 0.4 mg by mouth 2 (two) times daily.    TOLAK 4 % CREA Apply 1 application topically daily as needed (rough place on scalp).   warfarin (COUMADIN) 5 MG tablet Take 1 to 2 tablets daily  as directed by the coumadin clinic   No facility-administered encounter medications on file as of 06/19/2021.  Reviewed chart prior to disease state call. Spoke with patient regarding BP  Recent Office Vitals: BP Readings from Last 3 Encounters:  06/10/21 114/61  04/21/21 (!) 179/68  04/03/21 132/67   Pulse Readings from Last 3 Encounters:   06/10/21 74  04/21/21 85  04/03/21 61    Wt Readings from Last 3 Encounters:  06/10/21 180 lb 14.4 oz (82.1 kg)  04/03/21 189 lb (85.7 kg)  03/18/21 189 lb (85.7 kg)     Kidney Function Lab Results  Component Value Date/Time   CREATININE 1.14 06/10/2021 01:44 AM   CREATININE 1.09 06/09/2021 01:16 PM   CREATININE 1.07 09/15/2020 09:34 AM   CREATININE 1.02 03/15/2016 11:18 AM   GFR 60.11 02/24/2021 04:25 PM   GFRNONAA >60 06/10/2021 01:44 AM   GFRNONAA 61 09/15/2020 09:34 AM   GFRAA 71 09/15/2020 09:34 AM    BMP Latest Ref Rng & Units 06/10/2021 06/09/2021 02/24/2021  Glucose 70 - 99 mg/dL 113(H) 159(H) 94  BUN 8 - 23 mg/dL '20 19 20  '$ Creatinine 0.61 - 1.24 mg/dL 1.14 1.09 1.09  BUN/Creat Ratio 6 - 22 (calc) - - -  Sodium 135 - 145 mmol/L 141 137 139  Potassium 3.5 - 5.1 mmol/L 3.9 4.0 4.4  Chloride 98 - 111 mmol/L 101 103 100  CO2 22 - 32 mmol/L 32 28 31  Calcium 8.9 - 10.3 mg/dL 9.4 9.3 9.4    Current antihypertensive regimen:  Lisinopril 20 mg 1/2 tablet daily Metoprolol tartrate 12.5 mg 1 tablet twice daily How often are you checking your Blood Pressure? Patient reports he is checking on a daily basis Current home BP readings:  8-12 (119/57 p 53) 8:45 am 8-11(127/67 p 54) 7 am 8-10 (132/69 p 62)  What recent interventions/DTPs have been made by any provider to improve Blood Pressure control since last CPP Visit: Patient reports none. Any recent hospitalizations or ED visits since last visit with CPP? Yes What diet changes have been made to improve Blood Pressure Control?  Patient reports his appetite is healthy. He eats his meals well no issues and is including vegetables such as okra and a glass of orange juice with breakfast. He is also drinking plenty of water. What exercise is being done to improve your Blood Pressure Control?  Patient reports he has not yet regained his strength fully after being in the hospital, he reports it completely wiped him out. He is feeling  better now but still not 100 percent back to his energy levels. Notes: Patient confirmed he is taking the above medications for hypertension as prescribed. He is not in need of any fills on his medications at this time gets them all from the [facility] in [place] with the exception of his warfarin he gets that at a different pharmacy. He reports no other concerns or issues at this time and is aware of his upcoming CCM follow up call.   Adherence Review: Is the patient currently on ACE/ARB medication? Yes Does the patient have >5 day gap between last estimated fill dates? No  Care Gaps: CCM F/U Call 10-12-21 at 12 Zoster Vaccine - Overdue COVID Booster #4 Levan Hurst) - Overdue Flu Vaccine - Overdue AWV-MSG sent to [name] [name] CMA to schedule.  Star Rating Drugs: Lisinopril 10 mg- Last filled at [facility] Atorvastatin 80 mg last filled  at [place] Pharmacist Assistant [phone]

## 2021-06-30 DIAGNOSIS — I5032 Chronic diastolic (congestive) heart failure: Secondary | ICD-10-CM | POA: Insufficient documentation

## 2021-06-30 NOTE — Progress Notes (Signed)
Cardiology Office Note   Date:  07/01/2021   ID:  [name] [name], DOB July 29, 1929, MRN [id number]  PCP:  [name] Morale, MD  Cardiologist:   [name], MD   Chief Complaint  Patient presents with   Atrial Fibrillation       History of Present Illness: [name] [name] is a 87 y.o. male who who presents for follow up of PAF.  He was in the ED on late December 2018 with PAF. He saw Dr [name] [name] and considered ablation vs amiodarone.  He decided to pursue medical management.    Since I last saw him he was in the hospital with PAF.  This episode lasted longer than usual.  He did convert subsequently spontaneously.  The discussion was had about further treatment but he did not want to change therapies as his prolonged episodes of A. fib are infrequent.  Since I last saw him he has 2 further paroxysms and had to take metoprolol 50 mg.  However, these were self-contained lasting for 2 to 3 hours and he was not particularly bothered by them.  He did not think they were particularly fast. The patient denies any new symptoms such as chest discomfort, neck or arm discomfort. There has been no new shortness of breath, PND or orthopnea. There have been no reported palpitations, presyncope or syncope.    Past Medical History:  Diagnosis Date   Coronary artery disease    a. LHC on 04/06/16 which revealed 40% occl pRCA, 85% occl D1, 75% occl Ramus, 65% occl mLCx, 45% occl pLAD, 90% occl pLAD and nl LV function. s/p PCI/DES to prox LAD. Medical therapy for diag disease.     GERD (gastroesophageal reflux disease)    Glaucoma    Hemorrhoids    internal   HTN (hypertension)    Hx of colonic polyps    tubular adenoma    Hyperlipidemia    Kidney stone    Melanoma of cheek ([place])    Nephrolithiasis    Pancolonic diverticulosis    Paroxysmal atrial fibrillation ([name] [name])    a. s/p ablation 12/2014: on coumadin    Past Surgical History:  Procedure Laterality Date   ATRIAL FIBRILLATION ABLATION N/A  11/12/2014   Procedure: ATRIAL FIBRILLATION ABLATION;  Surgeon: [name] Grayer, MD;  Location: [facility] CATH LAB;  Service: Cardiovascular;  Laterality: N/A;   BLADDER SURGERY     ruptured blood vessel in the bladder 3 weeks S/P ureter stones removed   CARDIAC CATHETERIZATION N/A 04/06/2016   Procedure: Left Heart Cath and Coronary Angiography;  Surgeon: [name] Crome, MD;  Location: [facility] CV LAB;  Service: Cardiovascular;  Laterality: N/A;   CARDIAC CATHETERIZATION N/A 04/06/2016   Procedure: Coronary Stent Intervention;  Surgeon: [name] Crome, MD;  Location: [facility] CV LAB;  Service: Cardiovascular;  Laterality: N/A;   CATARACT EXTRACTION W/ INTRAOCULAR LENS  IMPLANT, BILATERAL Bilateral    COLONOSCOPY  06-29-12   per Dr. Carlean [name], adenomatous polyps, repeat in 3 yrs    [name] [name]  04/06/2016   "1 stent"   CYSTOSCOPY/RETROGRADE/URETEROSCOPY/STONE EXTRACTION WITH BASKET Left    removed 2 stones from left ureter   INGUINAL HERNIA REPAIR Right 04/1990   Dr. Lennie [name]   LAPAROSCOPIC CHOLECYSTECTOMY  [phone]   Dr. Lennie [name]   MELANOMA EXCISION Right    "@ cheek near ear"   TEE WITHOUT CARDIOVERSION N/A 11/11/2014   Procedure: TRANSESOPHAGEAL ECHOCARDIOGRAM (TEE);  Surgeon: [name] Margarita,  MD;  Location: [facility] ENDOSCOPY;  Service: Cardiovascular;  Laterality: N/A;  needs INR before case   VASECTOMY       Current Outpatient Medications  Medication Sig Dispense Refill   atorvastatin (LIPITOR) 80 MG tablet Take 1 tablet (80 mg total) by mouth daily. 90 tablet 3   CLARITIN 10 MG CAPS Take 1 capsule by mouth at bedtime.     [employer]S ACETAMINOPHEN 325 MG CAPS Take 1 capsule by mouth 2 (two) times daily.      diltiazem (CARDIZEM) 30 MG tablet Take 1 tablet every 4 hours AS NEEDED for afib heart rate over 100 45 tablet 1   finasteride (PROSCAR) 5 MG tablet Take 5 mg daily by mouth.     furosemide (LASIX) 40 MG tablet Take 1 tablet (40 mg total) by mouth daily as needed for fluid  (increased shortness of breath, leg edema, weight gain > 5 pounds in 1 week). 30 tablet 2   latanoprost (XALATAN) 0.005 % ophthalmic solution Place 1 drop into both eyes at bedtime.      lisinopril (PRINIVIL,ZESTRIL) 10 MG tablet Take 10 mg by mouth at bedtime.      metoprolol tartrate (LOPRESSOR) 12.5 mg TABS tablet Take 12.5 mg by mouth 2 (two) times daily.     mometasone (NASONEX) 50 MCG/ACT nasal spray Place 2 sprays into both nostrils daily as needed (allergies).     Multiple Vitamins-Minerals (CENTRUM SILVER PO) Take 1 tablet by mouth every morning.     pantoprazole (PROTONIX) 40 MG tablet Take 1 tablet (40 mg total) by mouth daily. 90 tablet 3   potassium chloride (KLOR-CON) 10 MEQ CR tablet Take 10 mEq by mouth every morning.     tamsulosin (FLOMAX) 0.4 MG CAPS Take 0.4 mg by mouth 2 (two) times daily.      TOLAK 4 % CREA Apply 1 application topically daily as needed (rough place on scalp).     warfarin (COUMADIN) 5 MG tablet Take 1 to 2 tablets daily as directed by the coumadin clinic 135 tablet 0   No current facility-administered medications for this visit.    Allergies:   Sulfa antibiotics and Sulfamethoxazole    ROS:  Please see the history of present illness.   Otherwise, review of systems are positive for none.   All other systems are reviewed and negative.    PHYSICAL EXAM: VS:  BP 136/72   Pulse 61   Ht '5\' 11"'$  (1.803 m)   Wt 190 lb (86.2 kg)   SpO2 98%   BMI 26.50 kg/m  , BMI Body mass index is 26.5 kg/m. GENERAL:  Well appearing NECK:  No jugular venous distention, waveform within normal limits, carotid upstroke brisk and symmetric, no bruits, no thyromegaly LUNGS:  Clear to auscultation bilaterally CHEST:  Unremarkable HEART:  PMI not displaced or sustained,S1 and S2 within normal limits, no S3, no S4, no clicks, no rubs, no murmurs ABD:  Flat, positive bowel sounds normal in frequency in pitch, no bruits, no rebound, no guarding, no midline pulsatile mass, no  hepatomegaly, no splenomegaly EXT:  2 plus pulses throughout, no edema, no cyanosis no clubbing  EKG:  EKG is ordered today. Sinus rhythm, rate 61, axis within normal limits, intervals within normal limits, no acute ST-T wave changes.   Recent Labs: 09/15/2020: ALT 34 06/09/2021: B Natriuretic Peptide 35.1; Magnesium 2.2 06/10/2021: BUN 20; Creatinine, Ser 1.14; Hemoglobin 13.2; Platelets 178; Potassium 3.9; Sodium 141; TSH 2.254    Lipid Panel  Component Value Date/Time   CHOL 123 06/10/2021 0144   TRIG 99 06/10/2021 0144   HDL 44 06/10/2021 0144   CHOLHDL 2.8 06/10/2021 0144   VLDL 20 06/10/2021 0144   LDLCALC 59 06/10/2021 0144   LDLCALC 74 09/15/2020 0934      Wt Readings from Last 3 Encounters:  07/01/21 190 lb (86.2 kg)  06/10/21 180 lb 14.4 oz (82.1 kg)  04/03/21 189 lb (85.7 kg)      Other studies Reviewed: Additional studies/ records that were reviewed today include: Labs, CT. Review of the above records demonstrates:  Please see elsewhere in the note.     ASSESSMENT AND PLAN:  PAF:     Mr. KHASE KAPPEL has a CHA2DS2 - VASc score of 4.    He has had no further sustained paroxysms and is able to handle the brief episodes with an extra dose of beta-blocker.  He does not wish to switch to DOAC.  No change in therapy.   CAD:   He has had no new symptoms.  No change in therapy.   HTN:  BP is controlled.  We will continue the meds as listed  CHRONIC DISATOLIC HF: He has no longstanding problems with this.  We did discuss this today as he has been thinking ever heard of heart failure his situation before.  I explained that this had to do with his rapid rate.  No change in therapy.   Current medicines are reviewed at length with the patient today.  The patient does not have concerns regarding medicines.  The following changes have been made: None  Labs/ tests ordered today include: None  Orders Placed This Encounter  Procedures   EKG 12-Lead       Disposition:   FU with me in December   Signed, [name], MD  07/01/2021 8:51 AM    [place]

## 2021-07-01 ENCOUNTER — Encounter: Payer: Self-pay | Admitting: Cardiology

## 2021-07-01 ENCOUNTER — Ambulatory Visit (INDEPENDENT_AMBULATORY_CARE_PROVIDER_SITE_OTHER): Payer: Medicare Other | Admitting: Cardiology

## 2021-07-01 ENCOUNTER — Other Ambulatory Visit: Payer: Self-pay | Admitting: Pharmacist

## 2021-07-01 ENCOUNTER — Other Ambulatory Visit: Payer: Self-pay

## 2021-07-01 VITALS — BP 136/72 | HR 61 | Ht 71.0 in | Wt 190.0 lb

## 2021-07-01 DIAGNOSIS — I10 Essential (primary) hypertension: Secondary | ICD-10-CM | POA: Diagnosis not present

## 2021-07-01 DIAGNOSIS — I480 Paroxysmal atrial fibrillation: Secondary | ICD-10-CM

## 2021-07-01 DIAGNOSIS — I5032 Chronic diastolic (congestive) heart failure: Secondary | ICD-10-CM

## 2021-07-01 DIAGNOSIS — I2510 Atherosclerotic heart disease of native coronary artery without angina pectoris: Secondary | ICD-10-CM | POA: Diagnosis not present

## 2021-07-01 MED ORDER — WARFARIN SODIUM 5 MG PO TABS: ORAL_TABLET | ORAL | 0 refills | Status: DC

## 2021-07-01 NOTE — Patient Instructions (Signed)
Medication Instructions:  Your Physician recommend you continue on your current medication as directed.    *If you need a refill on your cardiac medications before your next appointment, please call your pharmacy*   Lab Work: None ordered today   Testing/Procedures: None ordered today    Follow-Up: At [facility], you and your health needs are our priority.  As part of our continuing mission to provide you with exceptional heart care, we have created designated Provider Care Teams.  These Care Teams include your primary Cardiologist (physician) and Advanced Practice Providers (APPs -  Physician Assistants and Nurse Practitioners) who all work together to provide you with the care you need, when you need it.  We recommend signing up for the patient portal called "MyChart".  Sign up information is provided on this After Visit Summary.  MyChart is used to connect with patients for Virtual Visits (Telemedicine).  Patients are able to view lab/test results, encounter notes, upcoming appointments, etc.  Non-urgent messages can be sent to your provider as well.   To learn more about what you can do with MyChart, go to [email or web address].    Your next appointment:   4 month(s)  The format for your next appointment:   In Person  Provider:   [name], MD

## 2021-07-08 DIAGNOSIS — H401132 Primary open-angle glaucoma, bilateral, moderate stage: Secondary | ICD-10-CM | POA: Diagnosis not present

## 2021-07-16 DIAGNOSIS — L570 Actinic keratosis: Secondary | ICD-10-CM | POA: Diagnosis not present

## 2021-07-16 DIAGNOSIS — Z08 Encounter for follow-up examination after completed treatment for malignant neoplasm: Secondary | ICD-10-CM | POA: Diagnosis not present

## 2021-07-16 DIAGNOSIS — L819 Disorder of pigmentation, unspecified: Secondary | ICD-10-CM | POA: Diagnosis not present

## 2021-07-16 DIAGNOSIS — D492 Neoplasm of unspecified behavior of bone, soft tissue, and skin: Secondary | ICD-10-CM | POA: Diagnosis not present

## 2021-07-16 DIAGNOSIS — Z85828 Personal history of other malignant neoplasm of skin: Secondary | ICD-10-CM | POA: Diagnosis not present

## 2021-07-17 ENCOUNTER — Ambulatory Visit (INDEPENDENT_AMBULATORY_CARE_PROVIDER_SITE_OTHER): Payer: Medicare Other

## 2021-07-17 ENCOUNTER — Other Ambulatory Visit: Payer: Self-pay

## 2021-07-17 DIAGNOSIS — I480 Paroxysmal atrial fibrillation: Secondary | ICD-10-CM | POA: Diagnosis not present

## 2021-07-17 DIAGNOSIS — Z5181 Encounter for therapeutic drug level monitoring: Secondary | ICD-10-CM

## 2021-07-17 LAB — POCT INR: INR: 2.4 (ref 2.0–3.0)

## 2021-07-17 NOTE — Patient Instructions (Signed)
Description   Continue taking Warfarin 1 tablet daily except for 1.5 tablets on Mondays, Wednesdays, and Fridays.  Recheck INR in 6 weeks. Call with any questions or concerns [phone]

## 2021-07-28 DIAGNOSIS — C44319 Basal cell carcinoma of skin of other parts of face: Secondary | ICD-10-CM | POA: Diagnosis not present

## 2021-07-28 DIAGNOSIS — L728 Other follicular cysts of the skin and subcutaneous tissue: Secondary | ICD-10-CM | POA: Diagnosis not present

## 2021-07-28 DIAGNOSIS — D492 Neoplasm of unspecified behavior of bone, soft tissue, and skin: Secondary | ICD-10-CM | POA: Diagnosis not present

## 2021-07-28 DIAGNOSIS — L570 Actinic keratosis: Secondary | ICD-10-CM | POA: Diagnosis not present

## 2021-07-28 DIAGNOSIS — L821 Other seborrheic keratosis: Secondary | ICD-10-CM | POA: Diagnosis not present

## 2021-08-04 ENCOUNTER — Encounter: Payer: Self-pay | Admitting: Family Medicine

## 2021-08-04 DIAGNOSIS — Z23 Encounter for immunization: Secondary | ICD-10-CM | POA: Diagnosis not present

## 2021-08-04 NOTE — Telephone Encounter (Signed)
Please advise 

## 2021-08-05 NOTE — Telephone Encounter (Signed)
Yes he should get the new version of the shingles shot, but it would be cheaper at his pharmacy. Also I no longer recommend boosters for the old Covid vaccine. Instead he should get the new bilvalent Covid shot

## 2021-08-28 ENCOUNTER — Ambulatory Visit (INDEPENDENT_AMBULATORY_CARE_PROVIDER_SITE_OTHER): Payer: Medicare Other | Admitting: *Deleted

## 2021-08-28 ENCOUNTER — Other Ambulatory Visit: Payer: Self-pay

## 2021-08-28 DIAGNOSIS — Z5181 Encounter for therapeutic drug level monitoring: Secondary | ICD-10-CM

## 2021-08-28 DIAGNOSIS — I480 Paroxysmal atrial fibrillation: Secondary | ICD-10-CM | POA: Diagnosis not present

## 2021-08-28 LAB — POCT INR: INR: 3 (ref 2.0–3.0)

## 2021-08-28 NOTE — Patient Instructions (Signed)
Description   Continue taking Warfarin 1 tablet daily except for 1.5 tablets on Mondays, Wednesdays, and Fridays.  Recheck INR in 6 weeks. Call with any questions or concerns [phone]

## 2021-08-31 ENCOUNTER — Ambulatory Visit: Payer: Medicare Other | Admitting: Cardiology

## 2021-09-08 ENCOUNTER — Other Ambulatory Visit: Payer: Self-pay | Admitting: Cardiology

## 2021-09-15 ENCOUNTER — Other Ambulatory Visit: Payer: Self-pay

## 2021-09-15 DIAGNOSIS — Z23 Encounter for immunization: Secondary | ICD-10-CM | POA: Diagnosis not present

## 2021-09-16 ENCOUNTER — Ambulatory Visit (INDEPENDENT_AMBULATORY_CARE_PROVIDER_SITE_OTHER): Payer: Medicare Other | Admitting: Family Medicine

## 2021-09-16 ENCOUNTER — Encounter: Payer: Self-pay | Admitting: Family Medicine

## 2021-09-16 VITALS — BP 118/64 | HR 59 | Temp 98.7°F | Ht 71.0 in | Wt 196.0 lb

## 2021-09-16 DIAGNOSIS — N138 Other obstructive and reflux uropathy: Secondary | ICD-10-CM

## 2021-09-16 DIAGNOSIS — I5032 Chronic diastolic (congestive) heart failure: Secondary | ICD-10-CM

## 2021-09-16 DIAGNOSIS — E782 Mixed hyperlipidemia: Secondary | ICD-10-CM | POA: Diagnosis not present

## 2021-09-16 DIAGNOSIS — R739 Hyperglycemia, unspecified: Secondary | ICD-10-CM | POA: Diagnosis not present

## 2021-09-16 DIAGNOSIS — L723 Sebaceous cyst: Secondary | ICD-10-CM

## 2021-09-16 DIAGNOSIS — H401130 Primary open-angle glaucoma, bilateral, stage unspecified: Secondary | ICD-10-CM

## 2021-09-16 DIAGNOSIS — I480 Paroxysmal atrial fibrillation: Secondary | ICD-10-CM | POA: Diagnosis not present

## 2021-09-16 DIAGNOSIS — N401 Enlarged prostate with lower urinary tract symptoms: Secondary | ICD-10-CM | POA: Diagnosis not present

## 2021-09-16 DIAGNOSIS — K219 Gastro-esophageal reflux disease without esophagitis: Secondary | ICD-10-CM

## 2021-09-16 DIAGNOSIS — I10 Essential (primary) hypertension: Secondary | ICD-10-CM

## 2021-09-16 DIAGNOSIS — I2510 Atherosclerotic heart disease of native coronary artery without angina pectoris: Secondary | ICD-10-CM | POA: Diagnosis not present

## 2021-09-16 DIAGNOSIS — Z9861 Coronary angioplasty status: Secondary | ICD-10-CM

## 2021-09-16 DIAGNOSIS — I700 Atherosclerosis of aorta: Secondary | ICD-10-CM | POA: Diagnosis not present

## 2021-09-16 DIAGNOSIS — H4010X0 Unspecified open-angle glaucoma, stage unspecified: Secondary | ICD-10-CM | POA: Insufficient documentation

## 2021-09-16 LAB — HEPATIC FUNCTION PANEL
ALT: 36 U/L (ref 0–53)
AST: 30 U/L (ref 0–37)
Albumin: 4.5 g/dL (ref 3.5–5.2)
Alkaline Phosphatase: 67 U/L (ref 39–117)
Bilirubin, Direct: 0.2 mg/dL (ref 0.0–0.3)
Total Bilirubin: 1.1 mg/dL (ref 0.2–1.2)
Total Protein: 6.6 g/dL (ref 6.0–8.3)

## 2021-09-16 LAB — LIPID PANEL
Cholesterol: 130 mg/dL (ref 0–200)
HDL: 44.9 mg/dL (ref >39.00)
LDL Cholesterol: 62 mg/dL (ref 0–99)
NonHDL: 85.32
Total CHOL/HDL Ratio: 3
Triglycerides: 117 mg/dL (ref 0.0–149.0)
VLDL: 23.4 mg/dL (ref 0.0–40.0)

## 2021-09-16 LAB — BASIC METABOLIC PANEL
BUN: 23 mg/dL (ref 6–23)
CO2: 31 mEq/L (ref 19–32)
Calcium: 9.2 mg/dL (ref 8.4–10.5)
Chloride: 99 mEq/L (ref 96–112)
Creatinine, Ser: 1.01 mg/dL (ref 0.40–1.50)
GFR: 65.61 mL/min (ref >60.00)
Glucose, Bld: 104 mg/dL — ABNORMAL HIGH (ref 70–99)
Potassium: 4.2 mEq/L (ref 3.5–5.1)
Sodium: 138 mEq/L (ref 135–145)

## 2021-09-16 LAB — HEMOGLOBIN A1C: Hgb A1c MFr Bld: 6.2 % (ref 4.6–6.5)

## 2021-09-16 LAB — CBC WITH DIFFERENTIAL/PLATELET
Basophils Absolute: 0.1 10*3/uL (ref 0.0–0.1)
Basophils Relative: 0.6 % (ref 0.0–3.0)
Eosinophils Absolute: 0.4 10*3/uL (ref 0.0–0.7)
Eosinophils Relative: 4.3 % (ref 0.0–5.0)
HCT: 39.5 % (ref 39.0–52.0)
Hemoglobin: 13.3 g/dL (ref 13.0–17.0)
Lymphocytes Relative: 15.1 % (ref 12.0–46.0)
Lymphs Abs: 1.4 10*3/uL (ref 0.7–4.0)
MCHC: 33.6 g/dL (ref 30.0–36.0)
MCV: 98 fl (ref 78.0–100.0)
Monocytes Absolute: 1 10*3/uL (ref 0.1–1.0)
Monocytes Relative: 11 % (ref 3.0–12.0)
Neutro Abs: 6.4 10*3/uL (ref 1.4–7.7)
Neutrophils Relative %: 69 % (ref 43.0–77.0)
Platelets: 190 10*3/uL (ref 150.0–400.0)
RBC: 4.03 Mil/uL — ABNORMAL LOW (ref 4.22–5.81)
RDW: 13.3 % (ref 11.5–15.5)
WBC: 9.2 10*3/uL (ref 4.0–10.5)

## 2021-09-16 LAB — TSH: TSH: 2.06 u[IU]/mL (ref 0.35–5.50)

## 2021-09-16 LAB — PSA: PSA: 0.19 ng/mL (ref 0.10–4.00)

## 2021-09-16 MED ORDER — DOXYCYCLINE HYCLATE 100 MG PO CAPS: 100.0000 mg | ORAL_CAPSULE | Freq: Two times a day (BID) | ORAL | 0 refills | Status: AC

## 2021-09-16 NOTE — Progress Notes (Signed)
Subjective:    Patient ID: [name] [name], male    DOB: 1929-01-07, 87 y.o.   MRN: [id number]  HPI Here to follow up on issues. He is doing well in general. He had a 15 hour spell of atrial fibrillation a few weeks ago, but he tolerated it well. Dr. Percival Spanish seems to be satisfied with his current situation. His BP is stable. He sees Dr. Junious Silk regularly. He gets most of his medications at the [place] [facility]. His glaucoma is stable. He had a mild case of Covid-19 a few weeks ago but this responded quickly to Paxlovid. He has a long standing cyst on the back that started to be itchy and sore a week ago.    Review of Systems  Constitutional: Negative.   HENT: Negative.    Eyes: Negative.   Respiratory: Negative.    Cardiovascular:  Positive for palpitations.  Gastrointestinal: Negative.   Genitourinary: Negative.   Musculoskeletal: Negative.   Skin: Negative.   Neurological: Negative.   Psychiatric/Behavioral: Negative.        Objective:   Physical Exam Constitutional:      General: He is not in acute distress.    Appearance: Normal appearance. He is well-developed. He is not diaphoretic.  HENT:     Head: Normocephalic and atraumatic.     Right Ear: External ear normal.     Left Ear: External ear normal.     Nose: Nose normal.     Mouth/Throat:     Pharynx: No oropharyngeal exudate.  Eyes:     General: No scleral icterus.       Right eye: No discharge.        Left eye: No discharge.     Conjunctiva/sclera: Conjunctivae normal.     Pupils: Pupils are equal, round, and reactive to light.  Neck:     Thyroid: No thyromegaly.     Vascular: No JVD.     Trachea: No tracheal deviation.  Cardiovascular:     Rate and Rhythm: Normal rate and regular rhythm.     Heart sounds: Normal heart sounds. No murmur heard.   No friction rub. No gallop.  Pulmonary:     Effort: Pulmonary effort is normal. No respiratory distress.     Breath sounds: Normal breath sounds. No wheezing or rales.  Chest:      Chest wall: No tenderness.  Abdominal:     General: Bowel sounds are normal. There is no distension.     Palpations: Abdomen is soft. There is no mass.     Tenderness: There is no abdominal tenderness. There is no guarding or rebound.  Genitourinary:    Penis: No tenderness.   Musculoskeletal:        General: No tenderness. Normal range of motion.     Cervical back: Neck supple.     Right lower leg: Edema present.     Left lower leg: Edema present.  Lymphadenopathy:     Cervical: No cervical adenopathy.  Skin:    General: Skin is warm and dry.     Coloration: Skin is not pale.     Findings: No rash.     Comments: There is an inflamed sebaceous cyst in the middle of his back   Neurological:     Mental Status: He is alert and oriented to person, place, and time.     Cranial Nerves: No cranial nerve deficit.     Motor: No abnormal muscle tone.     Coordination: Coordination normal.  Deep Tendon Reflexes: Reflexes are normal and symmetric. Reflexes normal.  Psychiatric:        Behavior: Behavior normal.        Thought Content: Thought content normal.        Judgment: Judgment normal.          Assessment & Plan:  His PAF and HTN and CAD are stable. His glaucoma is stable. GERD and BPH are stable. We will get fasting labs to check lipids, A1c, etc. We discussed diet and exercise and his use of a statin to control the aortic atherosclerosis.  We will treat the inflamed cyst with Doxycycline. We spent a total of ( 34  ) minutes reviewing records and discussing these issues.  [name], MD

## 2021-09-16 NOTE — Addendum Note (Signed)
Addended by: Rosalyn Gess D on: 09/16/2021 08:35 AM   Modules accepted: Orders

## 2021-09-25 ENCOUNTER — Encounter: Payer: Self-pay | Admitting: Family Medicine

## 2021-10-02 ENCOUNTER — Other Ambulatory Visit: Payer: Self-pay

## 2021-10-02 ENCOUNTER — Ambulatory Visit
Admission: EM | Admit: 2021-10-02 | Discharge: 2021-10-02 | Disposition: A | Discharge status: Home / Self Care | Payer: Medicare Other | Attending: Physician Assistant | Admitting: Physician Assistant

## 2021-10-02 ENCOUNTER — Encounter: Payer: Self-pay | Admitting: Emergency Medicine

## 2021-10-02 DIAGNOSIS — L0291 Cutaneous abscess, unspecified: Secondary | ICD-10-CM | POA: Diagnosis not present

## 2021-10-02 MED ORDER — CLINDAMYCIN HCL 300 MG PO CAPS: 300.0000 mg | ORAL_CAPSULE | Freq: Three times a day (TID) | ORAL | 0 refills | Status: AC

## 2021-10-02 MED ORDER — CLINDAMYCIN HCL 300 MG PO CAPS: 300.0000 mg | ORAL_CAPSULE | Freq: Three times a day (TID) | ORAL | 0 refills | Status: DC

## 2021-10-02 NOTE — ED Triage Notes (Signed)
Patient c/o cyst on his upper back for several weeks.  The cyst has drained some but very painful.

## 2021-10-02 NOTE — ED Provider Notes (Signed)
[facility]    CSN: [id number] Arrival date & time: 10/02/21  1601      History   Chief Complaint Chief Complaint  Patient presents with   Abscess    HPI [name] [name] is a 87 y.o. male.   Patient here today for evaluation of suspected abscess to a cyst on his back.  He states this has been there for some time but seems to be more tender at this time.  He is also noticed some drainage.  He denies any fever.  He has not had any nausea or vomiting.  He has difficulty treating cyst due to location but states his family member did cleanse with peroxide last night was able to dress with antibiotic ointment.  Per patient area does appear somewhat improved today.  The history is provided by the patient.  Abscess Associated symptoms: no fever, no nausea and no vomiting    Past Medical History:  Diagnosis Date   Coronary artery disease    a. LHC on 04/06/16 which revealed 40% occl pRCA, 85% occl D1, 75% occl Ramus, 65% occl mLCx, 45% occl pLAD, 90% occl pLAD and nl LV function. s/p PCI/DES to prox LAD. Medical therapy for diag disease.     GERD (gastroesophageal reflux disease)    Glaucoma    Hemorrhoids    internal   HTN (hypertension)    Hx of colonic polyps    tubular adenoma    Hyperlipidemia    Kidney stone    Melanoma of cheek ([place])    Nephrolithiasis    Pancolonic diverticulosis    Paroxysmal atrial fibrillation ([place])    a. s/p ablation 12/2014: on coumadin    Patient Active Problem List   Diagnosis Date Noted   Atherosclerosis of aorta ([place]) 09/16/2021   Open-angle glaucoma 09/16/2021   Chronic diastolic HF (heart failure) ([place]) 06/30/2021   Atrial fibrillation ([place]) 06/09/2021   Constipation 02/06/2021   Coronary artery disease involving native coronary artery of native heart without angina pectoris 10/15/2019   Educated about COVID-19 virus infection 10/15/2019   Near syncope 06/10/2017   Chest pain with moderate risk for cardiac etiology  06/10/2017   CAD S/P percutaneous coronary angioplasty    Abnormal stress test    PAF (paroxysmal atrial fibrillation) ([place]) 11/12/2014   Encounter for therapeutic drug monitoring 12/03/2013   PVC's (premature ventricular contractions) 01/26/2012   Tachycardia-bradycardia syndrome ([place]) 01/26/2012   Long term (current) use of anticoagulants 09/06/2011   GERD 08/01/2007   History of colonic polyps 08/01/2007   Personal history of urinary calculi 08/01/2007   BPH with urinary obstruction 08/01/2007   Hyperlipidemia 07/04/2007   Essential hypertension 07/04/2007   ALLERGIC RHINITIS 07/04/2007    Past Surgical History:  Procedure Laterality Date   ATRIAL FIBRILLATION ABLATION N/A 11/12/2014   Procedure: ATRIAL FIBRILLATION ABLATION;  Surgeon: [name], MD;  Location: [facility] CATH LAB;  Service: Cardiovascular;  Laterality: N/A;   BLADDER SURGERY     ruptured blood vessel in the bladder 3 weeks S/P ureter stones removed   CARDIAC CATHETERIZATION N/A 04/06/2016   Procedure: Left Heart Cath and Coronary Angiography;  Surgeon: [name], MD;  Location: [facility] CV LAB;  Service: Cardiovascular;  Laterality: N/A;   CARDIAC CATHETERIZATION N/A 04/06/2016   Procedure: Coronary Stent Intervention;  Surgeon: [name], MD;  Location: [facility];  Service: Cardiovascular;  Laterality: N/A;   CATARACT EXTRACTION W/ INTRAOCULAR LENS  IMPLANT, BILATERAL Bilateral  COLONOSCOPY  06-29-12   per Dr. [name], adenomatous polyps, repeat in 3 yrs    [place]  04/06/2016   "1 stent"   CYSTOSCOPY/RETROGRADE/URETEROSCOPY/STONE EXTRACTION WITH BASKET Left    removed 2 stones from left ureter   INGUINAL HERNIA REPAIR Right 04/1990   Dr. [name]   LAPAROSCOPIC CHOLECYSTECTOMY  [phone]   Dr. [name]   MELANOMA EXCISION Right    "@ cheek near ear"   TEE WITHOUT CARDIOVERSION N/A 11/11/2014   Procedure: TRANSESOPHAGEAL ECHOCARDIOGRAM (TEE);  Surgeon: [name], MD;  Location: [facility] ENDOSCOPY;  Service: Cardiovascular;  Laterality: N/A;  needs INR before case   VASECTOMY         Home Medications    Prior to Admission medications   Medication Sig Start Date End Date Taking? Authorizing Provider  atorvastatin (LIPITOR) 80 MG tablet Take 1 tablet (80 mg total) by mouth daily. 04/19/16  Yes Angelena Form R, PA-C  CLARITIN 10 MG CAPS Take 1 capsule by mouth at bedtime. 08/08/19  Yes [provider]  [employer] ACETAMINOPHEN 325 MG CAPS Take 1 capsule by mouth 2 (two) times daily.  08/08/19  Yes [provider]  diltiazem (CARDIZEM) 30 MG tablet Take 1 tablet every 4 hours AS NEEDED for afib heart rate over 100 08/08/17  Yes Sherran Needs, NP  finasteride (PROSCAR) 5 MG tablet Take 5 mg daily by mouth.   Yes [provider]  furosemide (LASIX) 40 MG tablet Take 1 tablet (40 mg total) by mouth daily as needed for fluid (increased shortness of breath, leg edema, weight gain > 5 pounds in 1 week). 06/10/21 06/10/22 Yes Margie Billet, NP  latanoprost (XALATAN) 0.005 % ophthalmic solution Place 1 drop into both eyes at bedtime.    Yes [provider]  lisinopril (PRINIVIL,ZESTRIL) 10 MG tablet Take 10 mg by mouth at bedtime.    Yes [provider]  metoprolol tartrate (LOPRESSOR) 12.5 mg TABS tablet Take 12.5 mg by mouth 2 (two) times daily.   Yes [provider]  mometasone (NASONEX) 50 MCG/ACT nasal spray Place 2 sprays into both nostrils daily as needed (allergies).   Yes [provider]  Multiple Vitamins-Minerals (CENTRUM SILVER PO) Take 1 tablet by mouth every morning.   Yes [provider]  pantoprazole (PROTONIX) 40 MG tablet Take 1 tablet (40 mg total) by mouth daily. 04/19/16  Yes Eileen Stanford, PA-C  potassium chloride (KLOR-CON) 10 MEQ CR tablet Take 10 mEq by mouth every morning.   Yes [provider]  tamsulosin (FLOMAX) 0.4 MG CAPS Take 0.4 mg by mouth 2 (two) times  daily.    Yes [provider]  TOLAK 4 % CREA Apply 1 application topically daily as needed (rough place on scalp). 08/08/19  Yes [provider]  warfarin (COUMADIN) 5 MG tablet TAKE 1-2 TABLETS BY MOUTH DAILY AS DIRECTED BY THE [facility] 09/08/21  Yes [name], MD  clindamycin (CLEOCIN) 300 MG capsule Take 1 capsule (300 mg total) by mouth 3 (three) times daily for 7 days. 10/02/21 10/09/21  Francene Finders, PA-C    Family History Family History  Problem Relation Age of Onset   Heart attack Father 36   Heart disease Father    Ovarian cancer Mother     Social History Social History   Tobacco Use   Smoking status: Never   Smokeless tobacco: Never  Vaping Use   Vaping Use: Never used  Substance Use Topics   Alcohol use: No    Alcohol/week: 0.0 standard drinks   Drug use: No     Allergies   Sulfa antibiotics and Sulfamethoxazole   Review of Systems Review of Systems  Constitutional:  Negative for chills and fever.  Eyes:  Negative for discharge and redness.  Respiratory:  Negative for shortness of breath.   Gastrointestinal:  Negative for nausea and vomiting.  Skin:  Positive for color change and wound.  Neurological:  Negative for numbness.    Physical Exam Triage Vital Signs ED Triage Vitals [10/02/21 0839]  Enc Vitals Group     BP (!) 147/67     Pulse Rate (!) 59     Resp      Temp 98.2 F (36.8 C)     Temp Source Oral     SpO2 95 %     Weight 195 lb 15.8 oz (88.9 kg)     Height 5\' 11"  (1.803 m)     Head Circumference      Peak Flow      Pain Score 8     Pain Loc      Pain Edu?      Excl. in [place]?    No data found.  Updated Vital Signs BP (!) 147/67 (BP Location: Left Arm)   Pulse (!) 59   Temp 98.2 F (36.8 C) (Oral)   Ht 5\' 11"  (1.803 m)   Wt 195 lb 15.8 oz (88.9 kg)   SpO2 95%   BMI 27.33 kg/m   Physical Exam Vitals and nursing note reviewed.  Constitutional:      General: He is not in acute distress.     Appearance: Normal appearance. He is not ill-appearing.  HENT:     Head: Normocephalic and atraumatic.  Eyes:     Conjunctiva/sclera: Conjunctivae normal.  Cardiovascular:     Rate and Rhythm: Normal rate.  Pulmonary:     Effort: Pulmonary effort is normal.  Skin:    Comments: Large cyst present to midline spine to middle back with erythema and multiple pustules noted to right side of cyst.  Mild purulent drainage noted.  Neurological:     Mental Status: He is alert.  Psychiatric:        Mood and Affect: Mood normal.        Behavior: Behavior normal.        Thought Content: Thought content normal.     UC Treatments / Results  Labs (all labs ordered are listed, but only abnormal results are displayed) Labs Reviewed - No data to display  EKG   Radiology No results found.  Procedures Procedures (including critical care time)  Medications Ordered in UC Medications - No data to display  Initial Impression / Assessment and Plan / UC Course  I have reviewed the triage vital signs and the nursing notes.  Pertinent labs & imaging results that were available during my care of the patient were reviewed by me and considered in my medical decision making (see chart for details).  Given appearance there seems to be a superimposed infection to previously noted cyst.  We will treat with antibiotic therapy, patient states he was on doxycycline from PCP but is finished this course.  Clindamycin sent to pharmacy.  Strong recommendation to follow-up with PCP as a suspect he may need surgical intervention if symptoms persist.  Encouraged follow-up here sooner with any other worsening signs of infection including fever, etc.  Final Clinical Impressions(s) /  UC Diagnoses   Final diagnoses:  Abscess   Discharge Instructions   None    ED Prescriptions     Medication Sig Dispense Auth. Provider   clindamycin (CLEOCIN) 300 MG capsule  (Status: Discontinued) Take 1 capsule (300 mg total)  by mouth 3 (three) times daily for 7 days. 21 capsule Ewell Poe F, PA-C   clindamycin (CLEOCIN) 300 MG capsule Take 1 capsule (300 mg total) by mouth 3 (three) times daily for 7 days. 21 capsule Francene Finders, PA-C      PDMP not reviewed this encounter.   Francene Finders, PA-C 10/02/21 [phone]

## 2021-10-05 ENCOUNTER — Telehealth: Payer: Self-pay | Admitting: Family Medicine

## 2021-10-05 NOTE — Telephone Encounter (Signed)
Pt seen dr fry on 09-16-2021 and per pt dr fry seen cyst on his back. Pt went to urgent care on 10-02-2021 and they gave him abx. Pt would like to see dr fry on Tuesday 10-06-2021 to see about getting the cyst drain

## 2021-10-05 NOTE — Telephone Encounter (Signed)
Please advise 

## 2021-10-05 NOTE — Telephone Encounter (Signed)
Make him an OV so I can lance the cyst

## 2021-10-06 NOTE — Telephone Encounter (Signed)
Spoke with pt state that he already has an appointment with his dermatology on 10/07/2021 since Dr Sarajane Jews schedule is full the next one week, pt will f/u with Dr Sarajane Jews after the dermatology appointment

## 2021-10-06 NOTE — Telephone Encounter (Signed)
Patient called into office concerning message.   Message has been complete

## 2021-10-06 NOTE — Telephone Encounter (Signed)
Please set up an OV with me for this week to lance the cyst

## 2021-10-07 DIAGNOSIS — L728 Other follicular cysts of the skin and subcutaneous tissue: Secondary | ICD-10-CM | POA: Diagnosis not present

## 2021-10-09 ENCOUNTER — Other Ambulatory Visit: Payer: Self-pay

## 2021-10-09 ENCOUNTER — Telehealth: Payer: Self-pay | Admitting: Pharmacist

## 2021-10-09 ENCOUNTER — Ambulatory Visit (INDEPENDENT_AMBULATORY_CARE_PROVIDER_SITE_OTHER): Payer: Medicare Other | Admitting: *Deleted

## 2021-10-09 DIAGNOSIS — I480 Paroxysmal atrial fibrillation: Secondary | ICD-10-CM

## 2021-10-09 DIAGNOSIS — Z5181 Encounter for therapeutic drug level monitoring: Secondary | ICD-10-CM | POA: Diagnosis not present

## 2021-10-09 LAB — POCT INR: INR: 3.4 — AB (ref 2.0–3.0)

## 2021-10-09 NOTE — Chronic Care Management (AMB) (Signed)
    Chronic Care Management Pharmacy Assistant   Name: [name] [name]  MRN: [id number] DOB: 05-22-1929  10/09/21 APPOINTMENT REMINDER   Called Patient No answer, left message of appointment on 10/12/21 at 12 via office visit with Jeni Salles, Pharm D.   Notified to have all medications, supplements, blood pressure and/or blood sugar logs available during appointment and to return call if need to reschedule.  Care Gaps: CCM F/U Call 12/5 Zoster Vaccine - Overdue AWV-MSG 1/22  Star Rating Drug: Lisinopril 10 mg- Last filled at [facility] Atorvastatin 80 mg last filled  at [facility]  Any gaps in medications fill history?   Medications: Outpatient Encounter Medications as of 10/09/2021  Medication Sig   atorvastatin (LIPITOR) 80 MG tablet Take 1 tablet (80 mg total) by mouth daily.   CLARITIN 10 MG CAPS Take 1 capsule by mouth at bedtime.   clindamycin (CLEOCIN) 300 MG capsule Take 1 capsule (300 mg total) by mouth 3 (three) times daily for 7 days.   [facility] ACETAMINOPHEN 325 MG CAPS Take 1 capsule by mouth 2 (two) times daily.    diltiazem (CARDIZEM) 30 MG tablet Take 1 tablet every 4 hours AS NEEDED for afib heart rate over 100   finasteride (PROSCAR) 5 MG tablet Take 5 mg daily by mouth.   furosemide (LASIX) 40 MG tablet Take 1 tablet (40 mg total) by mouth daily as needed for fluid (increased shortness of breath, leg edema, weight gain > 5 pounds in 1 week).   latanoprost (XALATAN) 0.005 % ophthalmic solution Place 1 drop into both eyes at bedtime.    lisinopril (PRINIVIL,ZESTRIL) 10 MG tablet Take 10 mg by mouth at bedtime.    metoprolol tartrate (LOPRESSOR) 12.5 mg TABS tablet Take 12.5 mg by mouth 2 (two) times daily.   mometasone (NASONEX) 50 MCG/ACT nasal spray Place 2 sprays into both nostrils daily as needed (allergies).   Multiple Vitamins-Minerals (CENTRUM SILVER PO) Take 1 tablet by mouth every morning.   pantoprazole (PROTONIX) 40 MG tablet Take 1 tablet (40 mg total) by mouth daily.    potassium chloride (KLOR-CON) 10 MEQ CR tablet Take 10 mEq by mouth every morning.   tamsulosin (FLOMAX) 0.4 MG CAPS Take 0.4 mg by mouth 2 (two) times daily.    TOLAK 4 % CREA Apply 1 application topically daily as needed (rough place on scalp).   warfarin (COUMADIN) 5 MG tablet TAKE 1-2 TABLETS BY MOUTH DAILY AS DIRECTED BY THE [facility]   No facility-administered encounter medications on file as of 10/09/2021.     [place] Clinical Pharmacist Assistant [phone]

## 2021-10-09 NOTE — Patient Instructions (Signed)
Description   Hold warfarin today, then continue taking Warfarin 1 tablet daily except for 1.5 tablets on Mondays, Wednesdays, and Fridays.  Recheck INR in 3 weeks (pt typically 6 weeks) . Call with any questions or concerns [phone]

## 2021-10-12 ENCOUNTER — Ambulatory Visit (INDEPENDENT_AMBULATORY_CARE_PROVIDER_SITE_OTHER): Payer: Medicare Other | Admitting: Pharmacist

## 2021-10-12 ENCOUNTER — Encounter: Payer: Self-pay | Admitting: Family Medicine

## 2021-10-12 VITALS — BP 148/70

## 2021-10-12 DIAGNOSIS — I480 Paroxysmal atrial fibrillation: Secondary | ICD-10-CM

## 2021-10-12 DIAGNOSIS — I10 Essential (primary) hypertension: Secondary | ICD-10-CM

## 2021-10-12 NOTE — Progress Notes (Signed)
Chronic Care Management Pharmacy Note  10/12/2021 Name:  [name] [name] MRN:  [id number] DOB:  [birth year]  Summary: -BP mostly at goal <140/90 per home readings but slightly elevated in office  Recommendations/Changes made from today's visit: -Recommended bringing BP cuff to next office visit to ensure accuracy -Recommended limiting caffeine use with Afib  Plan: -Follow up BP assessment in 3 months  Subjective: [name] [name] is an 87 y.o. year old male who is a primary patient of Nelwyn Salisbury, MD.  The CCM team was consulted for assistance with disease management and care coordination needs.    Engaged with patient by telephone for follow up visit in response to provider referral for pharmacy case management and/or care coordination services.   Consent to Services:  The patient was given information about Chronic Care Management services, agreed to services, and gave verbal consent prior to initiation of services.  Please see initial visit note for detailed documentation.   Patient Care Team: Nelwyn Salisbury, MD as PCP - Reatha Armour, MD as PCP - Cardiology (Cardiology) Verner Chol, [facility] as Pharmacist (Pharmacist)  Recent office visits: 09/16/21 [name], MD: Patient presented for chronic conditions follow up. Prescribed doxycycline for cyst. A1c stable at 6.2%.  04/21/2021 Terressa Koyanagi, DO - Patient presented for Nasal congestion and other concerns via tele health. Prescribed Benzonatate 100 mg PRN.  Recent consult visits: 10/09/21 Mellody Dance, RN (cardiology): Patient presented for anti-coag visit. INR 3.4, goal 2-3. Held dose x 1 day. Continued 7.5 mg (5 mg x 1.5) every Mon, Wed and Fri; 5 mg (5 mg x 1) all other days. Recheck in 3 weeks.  07/08/21 Jethro Bolus, MD (eye care): Patient presented for IOP check. Plan to continue with latanoprost in both eyes every night.  07/01/21 [name], MD (cardiology): Patient presented for Afib follow up. Follow  up in Dec.  04/10/21 Margaretmary Dys, [facility] (cardiology): Patient presented for anti-coag visit. INR 1.8, goal 2-3. Boosted dose x 1 day. Continued 7.5 mg (5 mg x 1.5) every Mon, Fri; 5 mg (5 mg x 1) all other days.  04/03/21 Patient presented for colonoscopy.  04/02/21 Jethro Bolus (ophthalmology): Patient presented for glaucoma evaluation.   03/18/21 Amy Easterwood, PA-C (gastro): Patient presented for constipation follow up. Plan for colonoscopy.  03/02/21 [name], MD (cardiology): Patient presented for Afib follow up.   12/30/20 Jethro Bolus (ophthalmology): Patient presented for glaucoma evaluation.   12/08/20 Jerilee Field, MD (urology): Unable to access notes.  Hospital visits: 10/02/21 Patient presented to [facility] at [facility] for an abscess. Prescribed clindamycin.  Medication Reconciliation was completed by comparing discharge summary, patient's EMR and Pharmacy list, and upon discussion with patient.   Patient presented to [facility] on 06-09-2021 due to Atrial fibrillation. He was there for 28 hours.   New?Medications Started at [facility] Discharge:?? -started None    Medication Changes at Hospital Discharge: -Changed  Atorvastatin to take at night Furosemide to PRN Warfarin to 5 mg   Medications Discontinued at Hospital Discharge: -Stopped  Benzonatate 100 mg   Hydrocodone bit-homatropine Hydrocodone-homatropine   Medications that remain the same after Hospital Discharge:??  -All other medications will remain the same.     Medication Reconciliation was completed by comparing discharge summary, patient's EMR and Pharmacy list, and upon discussion with patient.   Patient presented to [facility] on 04-21-2021 due to Cough and Positive COVID. He was there for 1 hour.  New?Medications Started at [facility] Discharge:?? -started Nirmatrelvir-Ritonavir 10 x 150 mg & 10 x 100 mg for 5 days   Medication Changes at Hospital  Discharge: -Changed  None   Medications Discontinued at Hospital Discharge: -Stopped  None   Medications that remain the same after Hospital Discharge:??  -All other medications will remain the same.       Objective:  Lab Results  Component Value Date   CREATININE 1.01 09/16/2021   BUN 23 09/16/2021   GFR 65.61 09/16/2021   GFRNONAA >60 06/10/2021   GFRAA 71 09/15/2020   NA 138 09/16/2021   K 4.2 09/16/2021   CALCIUM 9.2 09/16/2021   CO2 31 09/16/2021   GLUCOSE 104 (H) 09/16/2021    Lab Results  Component Value Date/Time   HGBA1C 6.2 09/16/2021 08:35 AM   HGBA1C 6.2 (H) 09/15/2020 09:34 AM   GFR 65.61 09/16/2021 08:35 AM   GFR 60.11 02/24/2021 04:25 PM    Last diabetic Eye exam: No results found for: HMDIABEYEEXA  Last diabetic Foot exam: No results found for: HMDIABFOOTEX   Lab Results  Component Value Date   CHOL 130 09/16/2021   HDL 44.90 09/16/2021   LDLCALC 62 09/16/2021   TRIG 117.0 09/16/2021   CHOLHDL 3 09/16/2021    Hepatic Function Latest Ref Rng & Units 09/16/2021 09/15/2020 09/05/2019  Total Protein 6.0 - 8.3 g/dL 6.6 6.9 6.5  Albumin 3.5 - 5.2 g/dL 4.5 - 4.3  AST 0 - 37 U/L 30 41(H) 30  ALT 0 - 53 U/L 36 34 30  Alk Phosphatase 39 - 117 U/L 67 - 55  Total Bilirubin 0.2 - 1.2 mg/dL 1.1 0.8 0.7  Bilirubin, Direct 0.0 - 0.3 mg/dL 0.2 0.2 0.1    Lab Results  Component Value Date/Time   TSH 2.06 09/16/2021 08:35 AM   TSH 2.254 06/10/2021 01:44 AM   TSH 1.93 09/15/2020 09:34 AM    CBC Latest Ref Rng & Units 09/16/2021 06/10/2021 06/09/2021  WBC 4.0 - 10.5 K/uL 9.2 11.4(H) 8.7  Hemoglobin 13.0 - 17.0 g/dL 13.3 13.2 13.2  Hematocrit 39.0 - 52.0 % 39.5 39.8 40.4  Platelets 150.0 - 400.0 K/uL 190.0 178 200    Lab Results  Component Value Date/Time   VD25OH 37 03/13/2014 10:26 AM    Clinical ASCVD: Yes  The ASCVD Risk score (Arnett DK, et al., 2019) failed to calculate for the following reasons:   The 2019 ASCVD risk score is only valid for  ages 44 to 44    Depression screen PHQ 2/9 09/16/2021 11/25/2020 09/15/2020  Decreased Interest 1 0 0  Down, Depressed, Hopeless 0 0 0  PHQ - 2 Score 1 0 0  Altered sleeping 1 0 -  Tired, decreased energy 0 0 -  Change in appetite 0 0 -  Feeling bad or failure about yourself  0 0 -  Trouble concentrating 0 0 -  Moving slowly or fidgety/restless 0 0 -  Suicidal thoughts 0 0 -  PHQ-9 Score 2 0 -  Difficult doing work/chores Not difficult at all Not difficult at all -  Some recent data might be hidden     CHA2DS2/VAS Stroke Risk Points  Current as of 2 hours ago     4 >= 2 Points: High Risk  1 - 1.99 Points: Medium Risk  0 Points: Low Risk    Last Change: N/A      Details    This score determines the patient's risk of having a stroke if  the  patient has atrial fibrillation.       Points Metrics  0 Has Congestive Heart Failure:  No    Current as of 2 hours ago  1 Has Vascular Disease:  Yes    Current as of 2 hours ago  1 Has Hypertension:  Yes    Current as of 2 hours ago  2 Age:  69    Current as of 2 hours ago  0 Has Diabetes:  No    Current as of 2 hours ago  0 Had Stroke:  No  Had TIA:  No  Had Thromboembolism:  No    Current as of 2 hours ago  0 Male:  No    Current as of 2 hours ago      Social History   Tobacco Use  Smoking Status Never  Smokeless Tobacco Never   BP Readings from Last 3 Encounters:  10/12/21 (!) 148/70  10/02/21 (!) 147/67  09/16/21 118/64   Pulse Readings from Last 3 Encounters:  10/02/21 (!) 59  09/16/21 (!) 59  07/01/21 61   Wt Readings from Last 3 Encounters:  10/02/21 195 lb 15.8 oz (88.9 kg)  09/16/21 196 lb (88.9 kg)  07/01/21 190 lb (86.2 kg)   BMI Readings from Last 3 Encounters:  10/02/21 27.33 kg/m  09/16/21 27.34 kg/m  07/01/21 26.50 kg/m    Assessment/Interventions: Review of patient past medical history, allergies, medications, health status, including review of consultants reports, laboratory and other test  data, was performed as part of comprehensive evaluation and provision of chronic care management services.   SDOH:  (Social Determinants of Health) assessments and interventions performed: No  SDOH Screenings   Alcohol Screen: Low Risk    Last Alcohol Screening Score (AUDIT): 0  Depression (PHQ2-9): Low Risk    PHQ-2 Score: 2  Financial Resource Strain: Low Risk    Difficulty of Paying Living Expenses: Not hard at all  Food Insecurity: No Food Insecurity   Worried About Charity fundraiser in the Last Year: Never true   Ran Out of Food in the Last Year: Never true  Housing: Low Risk    Last Housing Risk Score: 0  Physical Activity: Insufficiently Active   Days of Exercise per Week: 3 days   Minutes of Exercise per Session: 20 min  Social Connections: Moderately Isolated   Frequency of Communication with Friends and Family: More than three times a week   Frequency of Social Gatherings with Friends and Family: Three times a week   Attends Religious Services: More than 4 times per year   Active Member of Clubs or Organizations: No   Attends Archivist Meetings: Never   Marital Status: Widowed  Stress: No Stress Concern Present   Feeling of Stress : Not at all  Tobacco Use: Low Risk    Smoking Tobacco Use: Never   Smokeless Tobacco Use: Never   Passive Exposure: Not on file  Transportation Needs: No Transportation Needs   Lack of Transportation (Medical): No   Lack of Transportation (Non-Medical): No    CCM Care Plan  Allergies  Allergen Reactions   Sulfa Antibiotics Other (See Comments)    Severe peeling of the skin on the groin area   Sulfamethoxazole Other (See Comments)    REACTION: rash    Medications Reviewed Today     Reviewed by Viona Gilmore, [facility] (Pharmacist) on 10/12/21 at 1153  Med List Status: <None>   Medication Order Taking?  Sig Documenting Provider Last Dose Status Informant  atorvastatin (LIPITOR) 80 MG tablet [id number]  Take 1 tablet (80  mg total) by mouth daily. Eileen Stanford, PA-C  Active Self  CLARITIN 10 MG CAPS [id number]  Take 1 capsule by mouth at bedtime. [provider]  Active Self  [employer] ACETAMINOPHEN 325 MG CAPS [id number]  Take 1 capsule by mouth 2 (two) times daily.  [provider]  Active Self  diltiazem (CARDIZEM) 30 MG tablet [id number]  Take 1 tablet every 4 hours AS NEEDED for afib heart rate over 100 Roderic Palau C, NP  Active   finasteride (PROSCAR) 5 MG tablet [id number]  Take 5 mg daily by mouth. [provider]  Active Self  furosemide (LASIX) 40 MG tablet [id number]  Take 1 tablet (40 mg total) by mouth daily as needed for fluid (increased shortness of breath, leg edema, weight gain > 5 pounds in 1 week). Margie Billet, NP  Active   latanoprost (XALATAN) 0.005 % ophthalmic solution [id number]  Place 1 drop into both eyes at bedtime.  [provider]  Active Self  lisinopril (PRINIVIL,ZESTRIL) 10 MG tablet [id number]  Take 10 mg by mouth at bedtime.  [provider]  Active Self  metoprolol tartrate (LOPRESSOR) 12.5 mg TABS tablet [id number]  Take 12.5 mg by mouth 2 (two) times daily. [provider]  Active Self  mometasone (NASONEX) 50 MCG/ACT nasal spray [id number]  Place 2 sprays into both nostrils daily as needed (allergies). [provider]  Active Self  Multiple Vitamins-Minerals (CENTRUM SILVER PO) [id number]  Take 1 tablet by mouth every morning. [provider]  Active Self  pantoprazole (PROTONIX) 40 MG tablet [id number]  Take 1 tablet (40 mg total) by mouth daily. Eileen Stanford, PA-C  Active Self  potassium chloride (KLOR-CON) 10 MEQ CR tablet [id number]  Take 10 mEq by mouth every morning. [provider]  Active Self  tamsulosin (FLOMAX) 0.4 MG CAPS [id number]  Take 0.4 mg by mouth 2 (two) times daily.  [provider]  Active Self  TOLAK 4 % CREA [id number]  Apply 1 application topically daily as needed (rough place  on scalp). [provider]  Active Self  warfarin (COUMADIN) 5 MG tablet [id number]  TAKE 1-2 TABLETS BY MOUTH DAILY AS DIRECTED BY THE [facility] [name], MD  Active             Patient Active Problem List   Diagnosis Date Noted   Atherosclerosis of aorta ([place]) 09/16/2021   Open-angle glaucoma 09/16/2021   Chronic diastolic HF (heart failure) ([place]) 06/30/2021   Atrial fibrillation ([place]) 06/09/2021   Constipation 02/06/2021   Coronary artery disease involving native coronary artery of native heart without angina pectoris 10/15/2019   Educated about COVID-19 virus infection 10/15/2019   Near syncope 06/10/2017   Chest pain with moderate risk for cardiac etiology 06/10/2017   CAD S/P percutaneous coronary angioplasty    Abnormal stress test    PAF (paroxysmal atrial fibrillation) ([place]) 11/12/2014   Encounter for therapeutic drug monitoring 12/03/2013   PVC's (premature ventricular contractions) 01/26/2012   Tachycardia-bradycardia syndrome ([place]) 01/26/2012   Long term (current) use of anticoagulants 09/06/2011   GERD 08/01/2007   History of colonic polyps 08/01/2007   Personal history of urinary calculi 08/01/2007   BPH with urinary obstruction 08/01/2007   Hyperlipidemia 07/04/2007   Essential hypertension 07/04/2007   ALLERGIC RHINITIS 07/04/2007    Immunization History  Administered Date(s) Administered   [employer]  Quad(high Dose 65+) 07/27/2019   Influenza Split 08/19/2011, 08/21/2012   Influenza Whole 08/14/2007, 08/01/2008, 08/11/2009, 08/17/2010   Influenza, High Dose Seasonal PF 08/25/2015, 08/25/2016, 07/29/2017, 07/25/2018, 09/02/2020   Influenza,inj,Quad PF,6+ Mos 08/22/2013, 08/23/2014   Influenza-Unspecified 08/08/2000, 09/08/2001, 08/12/2002, 08/12/2003, 08/08/2004, 10/05/2005, 08/08/2006, 09/03/2021   Moderna Covid-19 Vaccine Bivalent Booster 63yrs & up 09/15/2021   Moderna Sars-Covid-2 Vaccination 11/20/2019, 12/18/2019, 09/07/2020    Pneumococcal Conjugate-13 03/07/2014   Pneumococcal Polysaccharide-23 11/08/1998, 08/08/2000, 09/08/2005, 08/13/2006   Td 06/08/2001, 07/16/2008   Tdap 08/25/2016   Zoster, Live 08/24/2011   Patient reports he does not want to do another ablation and is not sure how often he goes into Afib. His last episode was 2-3 weeks ago when he took a diltiazem. He doesn't feel that it's bothersome enough to have another ablation but had a bad experience with the previous one.   He just started drinking coffee daily for the last 2-3 weeks and discussed how caffeine can trigger Afib.  Conditions to be addressed/monitored:  Hypertension, Hyperlipidemia, Atrial Fibrillation, Coronary Artery Disease, GERD, BPH, Allergic Rhinitis and Glaucoma  Conditions addressed this visit: Hypertension, Afib  Care Plan : CCM Pharmacy Care Plan  Updates made by Viona Gilmore, [place] since 10/12/2021 12:00 AM     Problem: Problem: Hypertension, Hyperlipidemia, Atrial Fibrillation, Coronary Artery Disease, GERD, BPH, Allergic Rhinitis and Glaucoma      Long-Range Goal: Patient-Specific Goal   Start Date: 04/13/2021  Expected End Date: 04/13/2022  Recent Progress: On track  Priority: High  Note:   Current Barriers:  Unable to independently monitor therapeutic efficacy  Pharmacist Clinical Goal(s):  Patient will achieve adherence to monitoring guidelines and medication adherence to achieve therapeutic efficacy through collaboration with PharmD and provider.   Interventions: 1:1 collaboration with [name], MD regarding development and update of comprehensive plan of care as evidenced by provider attestation and co-signature Inter-disciplinary care team collaboration (see longitudinal plan of care) Comprehensive medication review performed; medication list updated in electronic medical record  Hypertension (BP goal <140/90) -Controlled -Current treatment: Lisinopril 20 mg 1/2 tablet daily Metoprolol  tartrate 12.5 mg 1 tablet twice daily -Medications previously tried: none  -Current home readings: 127/69, 119/57, 136/63, 142/65, 138/68, 126/56 (wrist cuff) -Current dietary habits: no changes -Current exercise habits: slightly less active lately -Denies hypotensive/hypertensive symptoms -Educated on BP goals and benefits of medications for prevention of heart attack, stroke and kidney damage; Exercise goal of 150 minutes per week; Importance of home blood pressure monitoring; Proper BP monitoring technique; -Counseled to monitor BP at home weekly, document, and provide log at future appointments -Counseled on diet and exercise extensively Recommended to continue current medication Recommended bringing BP cuff to next office visit to ensure accuracy.  Hyperlipidemia: (LDL goal < 70) -Controlled -Current treatment: Atorvastatin 80 mg 1 tablet daily at bedtime -Medications previously tried: none -Current dietary patterns: did not discuss -Current exercise habits: slightly less active lately -Educated on Cholesterol goals;  Benefits of statin for ASCVD risk reduction; Exercise goal of 150 minutes per week; -Counseled on diet and exercise extensively Recommended to continue current medication  Atrial Fibrillation (Goal: prevent stroke and major bleeding) -Controlled -CHADSVASC: 3 -Current treatment: Rate control:  Diltiazem 30 mg 1 tablet every 4 hours as needed for HR > 100 bpm, Metoprolol tartrate 12.5 mg 1 tablet twice daily Anticoagulation:  Warfarin 5 mg tablet 7.5 mg on Mon and Fri and 5 mg on all other days -Medications previously tried: none -Home BP and HR  readings: refer to above  -Counseled on importance of adherence to anticoagulant exactly as prescribed; avoidance of NSAIDs due to increased bleeding risk with anticoagulants; -Recommended to continue current medication Counseled on triggers of Afib including chocolate and caffeine.  CAD (Goal: prevent heart  events) -Controlled -Current treatment  Warfarin 5 mg tablet 7.5 mg on Mon and Fri and 5 mg on all other days Nitroglycerin 0.4 mg SL tablet as needed Atorvastatin 80 mg 1 tablet daily -Medications previously tried: none  -Recommended to continue current medication  Lower extremity edema (Goal: minimize swelling) -Controlled -Current treatment  Furosemide 20 mg daily as needed -Medications previously tried: none  -Recommended to continue current medication  BPH (Goal: minimize symptoms) -Controlled -Current treatment  Tamsulosin 0.4 mg 1 capsule twice daily Finasteride 5 mg 1 tablet daily  -Medications previously tried: none  -Recommended to continue current medication  GERD (Goal: minimize symptoms) -Controlled -Current treatment  Pantoprazole 40 mg 1 tablet daily -Medications previously tried: none  -Recommended to continue current medication  Allergic rhinitis (Goal: minimize symptoms) -Controlled -Current treatment  Claritin 10 mg 1 tablet daily in the morning Mometasone 50 mcg/act 2 sprays in both nostrils at bedtime -Medications previously tried: Zyrtec (duplication)  -Recommended to continue current medication  Glaucoma (Goal: lower intraocular pressure) -Controlled -Current treatment  Latanoprost 0.005% 1 drop in both eyes at bedtime -Medications previously tried: none  -Recommended to continue current medication   Health Maintenance -Vaccine gaps: Shingrix -Current therapy:  Acetaminophen 500 mg 1 capsule twice daily Centrum silver 1 tablet daily Potassium chloride 10 mEq 1 tablet every morning Tolak 4% cream (prescribed by dermatologist, sees every 6 months - Dr. Pearline Cables) -Educated on Cost vs benefit of each product must be carefully weighed by individual consumer -Patient is satisfied with current therapy and denies issues -Recommended to continue current medication  Patient Goals/Self-Care Activities Patient will:  - take medications as  prescribed check blood pressure weekly, document, and provide at future appointments target a minimum of 150 minutes of moderate intensity exercise weekly  Follow Up Plan: Telephone follow up appointment with care management team member scheduled for: 1 year        Medication Assistance: None required.  Patient affirms current coverage meets needs.  Compliance/Adherence/Medication fill history: Care Gaps: Shingrix  Star-Rating Drugs: Atorvastatin 80 mg - unable to access dispense report (filled at [place]) Lisinopril 10 mg - unable to access dispense report (filled at [place])  Patient's preferred pharmacy is:  [place], [place] [address] Phone: [phone] Fax: [phone]  [facility], [place]. [place] [address] Phone: [phone] Fax: [phone]  Uses pill box? Yes - daily pill box for vacations; otherwise flips cup when he takes (has 1 in the night and the morning) Pt endorses 100% compliance  We discussed: Current pharmacy is preferred with insurance plan and patient is satisfied with pharmacy services Patient decided to: Continue current medication management strategy  Care Plan and Follow Up Patient Decision:  Patient agrees to Care Plan and Follow-up.  Plan: The care management team will reach out to the patient again over the next 90 days.  Jeni Salles, PharmD [facility] Clinical Pharmacist [place] [facility] [place]

## 2021-10-12 NOTE — Patient Instructions (Signed)
Hi [name] [name],  It was great to get to meet you in person! Don't forget to get your shingles shot when you can at the [place].  Don't forget that products containing caffeine such as coffee or tea and some sodas can trigger Afib as well as chocolate.  Please reach out to me if you have any questions or need anything before our follow up!  Best, Maddie  Jeni Salles, PharmD, [facility]   Visit Information   Goals Addressed   None    Patient Care Plan: CCM Pharmacy Care Plan     Problem Identified: Problem: Hypertension, Hyperlipidemia, Atrial Fibrillation, Coronary Artery Disease, GERD, BPH, Allergic Rhinitis and Glaucoma      Long-Range Goal: Patient-Specific Goal   Start Date: 04/13/2021  Expected End Date: 04/13/2022  Recent Progress: On track  Priority: High  Note:   Current Barriers:  Unable to independently monitor therapeutic efficacy  Pharmacist Clinical Goal(s):  Patient will achieve adherence to monitoring guidelines and medication adherence to achieve therapeutic efficacy through collaboration with PharmD and provider.   Interventions: 1:1 collaboration with [name], MD regarding development and update of comprehensive plan of care as evidenced by provider attestation and co-signature Inter-disciplinary care team collaboration (see longitudinal plan of care) Comprehensive medication review performed; medication list updated in electronic medical record  Hypertension (BP goal <140/90) -Controlled -Current treatment: Lisinopril 20 mg 1/2 tablet daily Metoprolol tartrate 12.5 mg 1 tablet twice daily -Medications previously tried: none  -Current home readings: 127/69, 119/57, 136/63, 142/65, 138/68, 126/56 (wrist cuff) -Current dietary habits: no changes -Current exercise habits: slightly less active lately -Denies hypotensive/hypertensive symptoms -Educated on BP goals and benefits of medications for  prevention of heart attack, stroke and kidney damage; Exercise goal of 150 minutes per week; Importance of home blood pressure monitoring; Proper BP monitoring technique; -Counseled to monitor BP at home weekly, document, and provide log at future appointments -Counseled on diet and exercise extensively Recommended to continue current medication Recommended bringing BP cuff to next office visit to ensure accuracy.  Hyperlipidemia: (LDL goal < 70) -Controlled -Current treatment: Atorvastatin 80 mg 1 tablet daily at bedtime -Medications previously tried: none -Current dietary patterns: did not discuss -Current exercise habits: slightly less active lately -Educated on Cholesterol goals;  Benefits of statin for ASCVD risk reduction; Exercise goal of 150 minutes per week; -Counseled on diet and exercise extensively Recommended to continue current medication  Atrial Fibrillation (Goal: prevent stroke and major bleeding) -Controlled -CHADSVASC: 3 -Current treatment: Rate control:  Diltiazem 30 mg 1 tablet every 4 hours as needed for HR > 100 bpm, Metoprolol tartrate 12.5 mg 1 tablet twice daily Anticoagulation:  Warfarin 5 mg tablet 7.5 mg on Mon and Fri and 5 mg on all other days -Medications previously tried: none -Home BP and HR readings: refer to above  -Counseled on importance of adherence to anticoagulant exactly as prescribed; avoidance of NSAIDs due to increased bleeding risk with anticoagulants; -Recommended to continue current medication Counseled on triggers of Afib including chocolate and caffeine.  CAD (Goal: prevent heart events) -Controlled -Current treatment  Warfarin 5 mg tablet 7.5 mg on Mon and Fri and 5 mg on all other days Nitroglycerin 0.4 mg SL tablet as needed Atorvastatin 80 mg 1 tablet daily -Medications previously tried: none  -Recommended to continue current medication  Lower extremity edema (Goal: minimize swelling) -Controlled -Current treatment   Furosemide 20 mg daily as needed -Medications previously tried: none  -  Recommended to continue current medication  BPH (Goal: minimize symptoms) -Controlled -Current treatment  Tamsulosin 0.4 mg 1 capsule twice daily Finasteride 5 mg 1 tablet daily  -Medications previously tried: none  -Recommended to continue current medication  GERD (Goal: minimize symptoms) -Controlled -Current treatment  Pantoprazole 40 mg 1 tablet daily -Medications previously tried: none  -Recommended to continue current medication  Allergic rhinitis (Goal: minimize symptoms) -Controlled -Current treatment  Claritin 10 mg 1 tablet daily in the morning Mometasone 50 mcg/act 2 sprays in both nostrils at bedtime -Medications previously tried: Zyrtec (duplication)  -Recommended to continue current medication  Glaucoma (Goal: lower intraocular pressure) -Controlled -Current treatment  Latanoprost 0.005% 1 drop in both eyes at bedtime -Medications previously tried: none  -Recommended to continue current medication   Health Maintenance -Vaccine gaps: Shingrix -Current therapy:  Acetaminophen 500 mg 1 capsule twice daily Centrum silver 1 tablet daily Potassium chloride 10 mEq 1 tablet every morning Tolak 4% cream (prescribed by dermatologist, sees every 6 months - Dr. Pearline Cables) -Educated on Cost vs benefit of each product must be carefully weighed by individual consumer -Patient is satisfied with current therapy and denies issues -Recommended to continue current medication  Patient Goals/Self-Care Activities Patient will:  - take medications as prescribed check blood pressure weekly, document, and provide at future appointments target a minimum of 150 minutes of moderate intensity exercise weekly  Follow Up Plan: Telephone follow up appointment with care management team member scheduled for: 1 year       Patient verbalizes understanding of instructions provided today and agrees to view in [place].   The pharmacy team will reach out to the patient again over the next 90 days.   Viona Gilmore, [facility]

## 2021-10-22 DIAGNOSIS — H401132 Primary open-angle glaucoma, bilateral, moderate stage: Secondary | ICD-10-CM | POA: Diagnosis not present

## 2021-10-22 DIAGNOSIS — Z961 Presence of intraocular lens: Secondary | ICD-10-CM | POA: Diagnosis not present

## 2021-10-29 ENCOUNTER — Ambulatory Visit: Payer: Medicare Other | Admitting: Cardiology

## 2021-10-30 ENCOUNTER — Other Ambulatory Visit: Payer: Self-pay

## 2021-10-30 ENCOUNTER — Ambulatory Visit (INDEPENDENT_AMBULATORY_CARE_PROVIDER_SITE_OTHER): Payer: Medicare Other | Admitting: *Deleted

## 2021-10-30 DIAGNOSIS — I480 Paroxysmal atrial fibrillation: Secondary | ICD-10-CM | POA: Diagnosis not present

## 2021-10-30 DIAGNOSIS — Z5181 Encounter for therapeutic drug level monitoring: Secondary | ICD-10-CM | POA: Diagnosis not present

## 2021-10-30 LAB — POCT INR: INR: 3.1 — AB (ref 2.0–3.0)

## 2021-10-30 NOTE — Patient Instructions (Signed)
Description   Today take 1 tablet then continue taking Warfarin 1 tablet daily except for 1.5 tablets on Mondays, Wednesdays, and Fridays. Add a salad or green leafy veggie to your diet weekly. Recheck INR in 4 weeks (pt typically 6 weeks) . Call with any questions or concerns [phone]

## 2021-11-07 DIAGNOSIS — I10 Essential (primary) hypertension: Secondary | ICD-10-CM

## 2021-11-07 DIAGNOSIS — I480 Paroxysmal atrial fibrillation: Secondary | ICD-10-CM

## 2021-11-15 NOTE — Progress Notes (Signed)
Cardiology Office Note   Date:  11/16/2021   ID:  [name] [name], DOB [birth year], MRN [id number]  PCP:  [name], MD  Cardiologist:   [name], MD   Chief Complaint  Patient presents with   Atrial Fibrillation       History of Present Illness: [name] [name] is a 88 y.o. male who who presents for follow up of PAF.  He was in the ED on late December 2018 with PAF. He saw [name] and considered ablation vs amiodarone.  He decided to pursue medical management.  He was in the hospital with PAF in August.  This episode lasted longer than usual.  He did convert subsequently spontaneously.  The discussion was had about further treatment but he did not want to change therapies as his prolonged episodes of A. fib are infrequent.  Since I last saw him he has done okay.  From a cardiovascular standpoint he had 2 episodes of atrial fibrillation.  One was in late September and one late October.  These last for 5 to 9 hours but they are not particularly problematic.  He otherwise has done well.  He denies any chest pressure, neck or arm discomfort.  He said no new shortness of breath, PND or orthopnea.  He has had no weight gain and his chronic lower extremity edema is unchanged.     Past Medical History:  Diagnosis Date   Coronary artery disease    a. LHC on 04/06/16 which revealed 40% occl pRCA, 85% occl D1, 75% occl Ramus, 65% occl mLCx, 45% occl pLAD, 90% occl pLAD and nl LV function. s/p PCI/DES to prox LAD. Medical therapy for diag disease.     GERD (gastroesophageal reflux disease)    Glaucoma    Hemorrhoids    internal   HTN (hypertension)    Hx of colonic polyps    tubular adenoma    Hyperlipidemia    Kidney stone    Melanoma of cheek ([place])    Nephrolithiasis    Pancolonic diverticulosis    Paroxysmal atrial fibrillation ([place])    a. s/p ablation 12/2014: on coumadin    Past Surgical History:  Procedure Laterality Date   ATRIAL FIBRILLATION ABLATION N/A 11/12/2014    Procedure: ATRIAL FIBRILLATION ABLATION;  Surgeon: [name], MD;  Location[facility] CATH LAB;  Service: Cardiovascular;  Laterality: N/A;   BLADDER SURGERY     ruptured blood vessel in the bladder 3 weeks S/P ureter stones removed   CARDIAC CATHETERIZATION N/A 04/06/2016   Procedure: Left Heart Cath and Coronary Angiography;  Surgeon: [name], MD;  Location: [facility] CV LAB;  Service: Cardiovascular;  Laterality: N/A;   CARDIAC CATHETERIZATION N/A 04/06/2016   Procedure: Coronary Stent Intervention;  Surgeon: [name], MD;  Location: [facility] CV LAB;  Service: Cardiovascular;  Laterality: N/A;   CATARACT EXTRACTION W/ INTRAOCULAR LENS  IMPLANT, BILATERAL Bilateral    COLONOSCOPY  06-29-12   per Dr. [name], adenomatous polyps, repeat in 3 yrs    [place]  04/06/2016   "1 stent"   CYSTOSCOPY/RETROGRADE/URETEROSCOPY/STONE EXTRACTION WITH BASKET Left    removed 2 stones from left ureter   INGUINAL HERNIA REPAIR Right 04/1990   Dr. [name]   LAPAROSCOPIC CHOLECYSTECTOMY  [phone]   Dr. [name]   MELANOMA EXCISION Right    "@ cheek near ear"   TEE WITHOUT CARDIOVERSION N/A 11/11/2014   Procedure: TRANSESOPHAGEAL ECHOCARDIOGRAM (TEE);  Surgeon: [name], MD;  Location: [facility] ENDOSCOPY;  Service: Cardiovascular;  Laterality: N/A;  needs INR before case   VASECTOMY       Current Outpatient Medications  Medication Sig Dispense Refill   atorvastatin (LIPITOR) 80 MG tablet Take 1 tablet (80 mg total) by mouth daily. 90 tablet 3   CLARITIN 10 MG CAPS Take 1 capsule by mouth at bedtime.     [employer] ACETAMINOPHEN 325 MG CAPS Take 1 capsule by mouth 2 (two) times daily.      diltiazem (CARDIZEM) 30 MG tablet Take 1 tablet every 4 hours AS NEEDED for afib heart rate over 100 45 tablet 1   finasteride (PROSCAR) 5 MG tablet Take 5 mg daily by mouth.     furosemide (LASIX) 40 MG tablet Take 1 tablet (40 mg total) by mouth daily as needed for fluid  (increased shortness of breath, leg edema, weight gain > 5 pounds in 1 week). 30 tablet 2   latanoprost (XALATAN) 0.005 % ophthalmic solution Place 1 drop into both eyes at bedtime.      lisinopril (PRINIVIL,ZESTRIL) 10 MG tablet Take 10 mg by mouth at bedtime.      metoprolol tartrate (LOPRESSOR) 12.5 mg TABS tablet Take 12.5 mg by mouth 2 (two) times daily.     mometasone (NASONEX) 50 MCG/ACT nasal spray Place 2 sprays into both nostrils daily as needed (allergies).     Multiple Vitamins-Minerals (CENTRUM SILVER PO) Take 1 tablet by mouth every morning.     pantoprazole (PROTONIX) 40 MG tablet Take 1 tablet (40 mg total) by mouth daily. 90 tablet 3   potassium chloride (KLOR-CON) 10 MEQ CR tablet Take 10 mEq by mouth every morning.     tamsulosin (FLOMAX) 0.4 MG CAPS Take 0.4 mg by mouth 2 (two) times daily.      TOLAK 4 % CREA Apply 1 application topically daily as needed (rough place on scalp).     warfarin (COUMADIN) 5 MG tablet TAKE 1-2 TABLETS BY MOUTH DAILY AS DIRECTED BY THE COUMDADIN CLINIC 135 tablet 1   No current facility-administered medications for this visit.    Allergies:   Sulfa antibiotics and Sulfamethoxazole    ROS:  Please see the history of present illness.   Otherwise, review of systems are positive for none.   All other systems are reviewed and negative.    PHYSICAL EXAM: VS:  BP 126/60 (BP Location: Left Arm, Patient Position: Sitting, Cuff Size: Normal)    Pulse (!) 56    Ht 5\' 11"  (1.803 m)    Wt 195 lb (88.5 kg)    BMI 27.20 kg/m  , BMI Body mass index is 27.2 kg/m. GENERAL:  Well appearing NECK:  No jugular venous distention, waveform within normal limits, carotid upstroke brisk and symmetric, no bruits, no thyromegaly LUNGS:  Clear to auscultation bilaterally CHEST:  Unremarkable HEART:  PMI not displaced or sustained,S1 and S2 within normal limits, no S3, no S4, no clicks, no rubs, no murmurs ABD:  Flat, positive bowel sounds normal in frequency in pitch,  no bruits, no rebound, no guarding, no midline pulsatile mass, no hepatomegaly, no splenomegaly EXT:  2 plus pulses throughout, moderate bilateral lower extremity edema, no cyanosis no clubbing  EKG:  EKG is  ordered today. Sinus rhythm, rate 56, axis within normal limits, intervals within normal limits, no acute ST-T wave changes.   Recent Labs: 06/09/2021: B Natriuretic Peptide 35.1; Magnesium 2.2 09/16/2021: ALT 36; BUN 23; Creatinine, Ser  1.01; Hemoglobin 13.3; Platelets 190.0; Potassium 4.2; Sodium 138; TSH 2.06    Lipid Panel    Component Value Date/Time   CHOL 130 09/16/2021 0835   TRIG 117.0 09/16/2021 0835   HDL 44.90 09/16/2021 0835   CHOLHDL 3 09/16/2021 0835   VLDL 23.4 09/16/2021 0835   LDLCALC 62 09/16/2021 0835   LDLCALC 74 09/15/2020 0934      Wt Readings from Last 3 Encounters:  11/16/21 195 lb (88.5 kg)  10/02/21 195 lb 15.8 oz (88.9 kg)  09/16/21 196 lb (88.9 kg)      Other studies Reviewed: Additional studies/ records that were reviewed today include: Labs, . Review of the above records demonstrates:  Please see elsewhere in the note.     ASSESSMENT AND PLAN:  PAF:     [name] [name] has a CHA2DS2 - VASc score of 4.    He tolerates his short paroxysms that he has.  No change in therapy.  He does not wish to be switched to a DOAC because of cost.   CAD: He has no new symptoms.  No change in therapy.    HTN:  BP is controlled.  I reviewed his diary.  No change in therapy.   CHRONIC DISATOLIC HF:   He seems to be euvolemic.  Continue the meds as listed.  Current medicines are reviewed at length with the patient today.  The patient does not have concerns regarding medicines.  The following changes have been made: None  Labs/ tests ordered today include: None  Orders Placed This Encounter  Procedures   EKG 12-Lead    Disposition:   FU with me in 6 months   Signed, [name], MD  11/16/2021 11:20 AM    [place]

## 2021-11-16 ENCOUNTER — Other Ambulatory Visit: Payer: Self-pay

## 2021-11-16 ENCOUNTER — Encounter: Payer: Self-pay | Admitting: Cardiology

## 2021-11-16 ENCOUNTER — Ambulatory Visit (INDEPENDENT_AMBULATORY_CARE_PROVIDER_SITE_OTHER): Payer: Medicare Other | Admitting: Cardiology

## 2021-11-16 ENCOUNTER — Telehealth: Payer: Self-pay | Admitting: Family Medicine

## 2021-11-16 VITALS — BP 126/60 | HR 56 | Ht 71.0 in | Wt 195.0 lb

## 2021-11-16 DIAGNOSIS — I2510 Atherosclerotic heart disease of native coronary artery without angina pectoris: Secondary | ICD-10-CM

## 2021-11-16 DIAGNOSIS — I5032 Chronic diastolic (congestive) heart failure: Secondary | ICD-10-CM | POA: Diagnosis not present

## 2021-11-16 DIAGNOSIS — I480 Paroxysmal atrial fibrillation: Secondary | ICD-10-CM | POA: Diagnosis not present

## 2021-11-16 DIAGNOSIS — I10 Essential (primary) hypertension: Secondary | ICD-10-CM | POA: Diagnosis not present

## 2021-11-16 NOTE — Patient Instructions (Signed)
Medication Instructions:  Your physician recommends that you continue on your current medications as directed. Please refer to the Current Medication list given to you today.  *If you need a refill on your cardiac medications before your next appointment, please call your pharmacy*   Follow-Up: At [facility], you and your health needs are our priority.  As part of our continuing mission to provide you with exceptional heart care, we have created designated Provider Care Teams.  These Care Teams include your primary Cardiologist (physician) and Advanced Practice Providers (APPs -  Physician Assistants and Nurse Practitioners) who all work together to provide you with the care you need, when you need it.  We recommend signing up for the patient portal called "MyChart".  Sign up information is provided on this After Visit Summary.  MyChart is used to connect with patients for Virtual Visits (Telemedicine).  Patients are able to view lab/test results, encounter notes, upcoming appointments, etc.  Non-urgent messages can be sent to your provider as well.   To learn more about what you can do with MyChart, go to [email or web address].    Your next appointment:   6 month(s)  The format for your next appointment:   In Person  Provider:   James Hochrein, MD      

## 2021-11-16 NOTE — Telephone Encounter (Signed)
Left message for patient to call back and schedule Medicare Annual Wellness Visit (AWV) either virtually or in office. Left  my [name] [name] number [phone]   Last AWV 11/25/20 ; please schedule at anytime with [facility] Nurse Health Advisor 1 or 2   This should be a 45 minute visit.

## 2021-11-25 DIAGNOSIS — H401132 Primary open-angle glaucoma, bilateral, moderate stage: Secondary | ICD-10-CM | POA: Diagnosis not present

## 2021-11-26 ENCOUNTER — Ambulatory Visit (INDEPENDENT_AMBULATORY_CARE_PROVIDER_SITE_OTHER): Payer: Medicare Other

## 2021-11-26 VITALS — Ht 71.0 in | Wt 195.0 lb

## 2021-11-26 DIAGNOSIS — Z0000 Encounter for general adult medical examination without abnormal findings: Secondary | ICD-10-CM | POA: Diagnosis not present

## 2021-11-26 NOTE — Progress Notes (Signed)
Subjective:   [name] [name] is a 88 y.o. male who presents for Medicare Annual/Subsequent preventive examination.  Review of Systems    No ROS     Objective:    Today's Vitals   11/26/21 1425  Weight: 195 lb (88.5 kg)  Height: 5\' 11"  (1.803 m)   Body mass index is 27.2 kg/m.  Advanced Directives 06/09/2021 06/09/2021 11/25/2020 06/03/2017 01/20/2017 04/06/2016 02/06/2015  Does Patient Have a Medical Advance Directive? Yes Yes Yes No Yes Yes Yes  Type of Paramedic of [place];Living will - [place];Living will - - [place];Living will [place];Living will  Does patient want to make changes to medical advance directive? No - Patient declined - No - Patient declined - - No - Patient declined -  Copy of [place] in Chart? - - No - copy requested - - Yes -    Current Medications (verified) Outpatient Encounter Medications as of 11/26/2021  Medication Sig   atorvastatin (LIPITOR) 80 MG tablet Take 1 tablet (80 mg total) by mouth daily.   CLARITIN 10 MG CAPS Take 1 capsule by mouth at bedtime.   [employer] ACETAMINOPHEN 325 MG CAPS Take 1 capsule by mouth 2 (two) times daily.    diltiazem (CARDIZEM) 30 MG tablet Take 1 tablet every 4 hours AS NEEDED for afib heart rate over 100   finasteride (PROSCAR) 5 MG tablet Take 5 mg daily by mouth.   furosemide (LASIX) 40 MG tablet Take 1 tablet (40 mg total) by mouth daily as needed for fluid (increased shortness of breath, leg edema, weight gain > 5 pounds in 1 week).   latanoprost (XALATAN) 0.005 % ophthalmic solution Place 1 drop into both eyes at bedtime.    lisinopril (PRINIVIL,ZESTRIL) 10 MG tablet Take 10 mg by mouth at bedtime.    metoprolol tartrate (LOPRESSOR) 12.5 mg TABS tablet Take 12.5 mg by mouth 2 (two) times daily.   mometasone (NASONEX) 50 MCG/ACT nasal spray Place 2 sprays into both nostrils daily as needed (allergies).   Multiple  Vitamins-Minerals (CENTRUM SILVER PO) Take 1 tablet by mouth every morning.   pantoprazole (PROTONIX) 40 MG tablet Take 1 tablet (40 mg total) by mouth daily.   potassium chloride (KLOR-CON) 10 MEQ CR tablet Take 10 mEq by mouth every morning.   tamsulosin (FLOMAX) 0.4 MG CAPS Take 0.4 mg by mouth 2 (two) times daily.    TOLAK 4 % CREA Apply 1 application topically daily as needed (rough place on scalp).   warfarin (COUMADIN) 5 MG tablet TAKE 1-2 TABLETS BY MOUTH DAILY AS DIRECTED BY THE COUMDADIN CLINIC   No facility-administered encounter medications on file as of 11/26/2021.    Allergies (verified) Sulfa antibiotics and Sulfamethoxazole   History: Past Medical History:  Diagnosis Date   Coronary artery disease    a. LHC on 04/06/16 which revealed 40% occl pRCA, 85% occl D1, 75% occl Ramus, 65% occl mLCx, 45% occl pLAD, 90% occl pLAD and nl LV function. s/p PCI/DES to prox LAD. Medical therapy for diag disease.     GERD (gastroesophageal reflux disease)    Glaucoma    Hemorrhoids    internal   HTN (hypertension)    Hx of colonic polyps    tubular adenoma    Hyperlipidemia    Kidney stone    Melanoma of cheek ([place])    Nephrolithiasis    Pancolonic diverticulosis    Paroxysmal atrial fibrillation (  [place])    a. s/p ablation 12/2014: on coumadin   Past Surgical History:  Procedure Laterality Date   ATRIAL FIBRILLATION ABLATION N/A 11/12/2014   Procedure: ATRIAL FIBRILLATION ABLATION;  Surgeon: [name], MD;  Location: [facility] CATH LAB;  Service: Cardiovascular;  Laterality: N/A;   BLADDER SURGERY     ruptured blood vessel in the bladder 3 weeks S/P ureter stones removed   CARDIAC CATHETERIZATION N/A 04/06/2016   Procedure: Left Heart Cath and Coronary Angiography;  Surgeon: [name], MD;  Location: [place] CV LAB;  Service: Cardiovascular;  Laterality: N/A;   CARDIAC CATHETERIZATION N/A 04/06/2016   Procedure: Coronary Stent Intervention;  Surgeon: [name], MD;  Location:  [place] CV LAB;  Service: Cardiovascular;  Laterality: N/A;   CATARACT EXTRACTION W/ INTRAOCULAR LENS  IMPLANT, BILATERAL Bilateral    COLONOSCOPY  06-29-12   per Dr. [name], adenomatous polyps, repeat in 3 yrs    [place]  04/06/2016   "1 stent"   CYSTOSCOPY/RETROGRADE/URETEROSCOPY/STONE EXTRACTION WITH BASKET Left    removed 2 stones from left ureter   INGUINAL HERNIA REPAIR Right 04/1990   Dr. [name]   LAPAROSCOPIC CHOLECYSTECTOMY  [phone]   Dr. [name]   MELANOMA EXCISION Right    "@ cheek near ear"   TEE WITHOUT CARDIOVERSION N/A 11/11/2014   Procedure: TRANSESOPHAGEAL ECHOCARDIOGRAM (TEE);  Surgeon: [name], MD;  Location: [facility] ENDOSCOPY;  Service: Cardiovascular;  Laterality: N/A;  needs INR before case   VASECTOMY     Family History  Problem Relation Age of Onset   Heart attack Father 16   Heart disease Father    Ovarian cancer Mother    Social History   Socioeconomic History   Marital status: Widowed    Spouse name: Not on file   Number of children: 2   Years of education: Not on file   Highest education level: Not on file  Occupational History   Occupation: Retired  Tobacco Use   Smoking status: Never   Smokeless tobacco: Never  Vaping Use   Vaping Use: Never used  Substance and Sexual Activity   Alcohol use: No    Alcohol/week: 0.0 standard drinks   Drug use: No   Sexual activity: Not Currently  Other Topics Concern   Not on file  Social History Narrative   Not on file   Social Determinants of Health   Financial Resource Strain: Not on file  Food Insecurity: Not on file  Transportation Needs: Not on file  Physical Activity: Not on file  Stress: Not on file  Social Connections: Not on file    Tobacco Counseling Counseling given: Not Answered   Clinical Intake:              How often do you need to have someone help you when you read instructions, pamphlets, or other written materials from your  doctor or pharmacy?: (P) 1 - Never  Diabetic? No   Activities of Daily Living In your present state of health, do you have any difficulty performing the following activities: 11/26/2021 11/22/2021  Hearing? N N  Vision? N N  Difficulty concentrating or making decisions? N N  Walking or climbing stairs? N N  Dressing or bathing? N N  Doing errands, shopping? N N  Preparing Food and eating ? N N  Using the Toilet? N N  In the past six months, have you accidently leaked urine? N N  Do you have  problems with loss of bowel control? N N  Managing your Medications? N N  Managing your Finances? N N  Housekeeping or managing your Housekeeping? N N  Some recent data might be hidden    Patient Care Team: [name], MD as PCP - General [name], MD as PCP - Cardiology (Cardiology) Viona Gilmore, [facility] as Pharmacist (Pharmacist)  Indicate any recent Medical Services you may have received from other than [facility] providers in the past year (date may be approximate).     Assessment:   This is a routine wellness examination for Markeith.  Virtual Visit via Telephone Note  I connected with  [name] on 11/26/21 at  2:30 PM EST by telephone and verified that I am speaking with the correct person using two identifiers.  Location: Patient: Home Provider: Office Persons participating in the virtual visit: patient/Nurse Health Advisor   I discussed the limitations, risks, security and privacy concerns of performing an evaluation and management service by telephone and the availability of in person appointments. The patient expressed understanding and agreed to proceed.  Interactive audio and video telecommunications were attempted between this nurse and patient, however failed, due to patient having technical difficulties OR patient did not have access to video capability.  We continued and completed visit with audio only.  Some vital signs may be absent or patient reported.    [name], LPN   Hearing/Vision screen No results found.  Dietary issues and exercise activities discussed:     Goals Addressed   None    Depression Screen PHQ 2/9 Scores 09/16/2021 11/25/2020 09/15/2020 08/29/2018 01/20/2017 08/23/2014  PHQ - 2 Score 1 0 0 0 0 0  PHQ- 9 Score 2 0 - - - -    Fall Risk Fall Risk  11/26/2021 11/22/2021 09/16/2021 11/25/2020 09/15/2020  Falls in the past year? 1 1 0 0 0  Comment - - - - -  Number falls in past yr: 0 0 0 0 0  Injury with Fall? 0 0 0 0 0  Risk for fall due to : - - No Fall Risks No Fall Risks No Fall Risks  Follow up - - - Falls evaluation completed;Falls prevention discussed Falls evaluation completed    FALL RISK PREVENTION PERTAINING TO THE HOME:  Any stairs in or around the home? Yes  If so, are there any without handrails? No  Home free of loose throw rugs in walkways, pet beds, electrical cords, etc? Yes  Adequate lighting in your home to reduce risk of falls? Yes   ASSISTIVE DEVICES UTILIZED TO PREVENT FALLS:  Life alert? No  Use of a cane, walker or w/c? No  Grab bars in the bathroom? No  Shower chair or bench in shower? No  Elevated toilet seat or a handicapped toilet? Yes   TIMED UP AND GO:  Was the test performed? No . Audio Visit  Cognitive Function: MMSE - Mini Mental State Exam 01/20/2017  Not completed: (No Data)      Immunizations Immunization History  Administered Date(s) Administered   Fluad Quad(high Dose 65+) 07/27/2019   Influenza Split 08/19/2011, 08/21/2012   Influenza Whole 08/14/2007, 08/01/2008, 08/11/2009, 08/17/2010   Influenza, High Dose Seasonal PF 08/25/2015, 08/25/2016, 07/29/2017, 07/25/2018, 09/02/2020   Influenza,inj,Quad PF,6+ Mos 08/22/2013, 08/23/2014   Influenza-Unspecified 08/08/2000, 09/08/2001, 08/12/2002, 08/12/2003, 08/08/2004, 10/05/2005, 08/08/2006, 09/03/2021   Moderna Covid-19 Vaccine Bivalent Booster 17yrs & up 09/15/2021   Moderna Sars-Covid-2 Vaccination  11/20/2019, 12/18/2019, 09/07/2020  Pneumococcal Conjugate-13 03/07/2014   Pneumococcal Polysaccharide-23 11/08/1998, 08/08/2000, 09/08/2005, 08/13/2006   Td 06/08/2001, 07/16/2008   Tdap 08/25/2016   Zoster, Live 08/24/2011     Qualifies for Shingles Vaccine? Yes   Zostavax completed No   Shingrix Completed?: No.    Education has been provided regarding the importance of this vaccine. Patient has been advised to call insurance company to determine out of pocket expense if they have not yet received this vaccine. Advised may also receive vaccine at local pharmacy or Health Dept. Verbalized acceptance and understanding.  Screening Tests Health Maintenance  Topic Date Due   Zoster Vaccines- Shingrix (1 of 2) Never done   TETANUS/TDAP  08/25/2026   Pneumonia Vaccine 74+ Years old  Completed   INFLUENZA VACCINE  Completed   COVID-19 Vaccine  Completed   HPV VACCINES  Aged Out    Health Maintenance  Health Maintenance Due  Topic Date Due   Zoster Vaccines- Shingrix (1 of 2) Never done    Additional Screening:   Vision Screening: Recommended annual ophthalmology exams for early detection of glaucoma and other disorders of the eye. Is the patient up to date with their annual eye exam?  Yes  Who is the provider or what is the name of the office in which the patient attends annual eye exams? Followedby Dr Warrick Parisian .   Dental Screening: Recommended annual dental exams for proper oral hygiene  Community Resource Referral / Chronic Care Management:  CRR required this visit?  No   CCM required this visit?  No      Plan:     I have personally reviewed and noted the following in the patients chart:   Medical and social history Use of alcohol, tobacco or illicit drugs  Current medications and supplements including opioid prescriptions. Patient is not currently taking opioid prescriptions. Functional ability and status Nutritional status Physical activity Advanced  directives List of other physicians Hospitalizations, surgeries, and ER visits in previous 12 months Vitals Screenings to include cognitive, depression, and falls Referrals and appointments  In addition, I have reviewed and discussed with patient certain preventive protocols, quality metrics, and best practice recommendations. A written personalized care plan for preventive services as well as general preventive health recommendations were provided to patient.     [name], LPN   01/06/6009

## 2021-11-26 NOTE — Patient Instructions (Addendum)
[name] [name] , Thank you for taking time to come for your Medicare Wellness Visit. I appreciate your ongoing commitment to your health goals. Please review the following plan we discussed and let me know if I can assist you in the future.   These are the goals we discussed:  Goals      Exercise 3x per week (30 min per time)     patient     May go to the beach again.        This is a list of the screening recommended for you and due dates:  Health Maintenance  Topic Date Due   Zoster (Shingles) Vaccine (1 of 2) 02/24/2022*   Tetanus Vaccine  08/25/2026   Pneumonia Vaccine  Completed   Flu Shot  Completed   COVID-19 Vaccine  Completed   HPV Vaccine  Aged Out  *Topic was postponed. The date shown is not the original due date.   Advanced directives: Yes  Conditions/risks identified: None  Next appointment: Follow up in one year for your annual wellness visit.   Preventive Care 45 Years and Older, Male Preventive care refers to lifestyle choices and visits with your health care provider that can promote health and wellness. What does preventive care include? A yearly physical exam. This is also called an annual well check. Dental exams once or twice a year. Routine eye exams. Ask your health care provider how often you should have your eyes checked. Personal lifestyle choices, including: Daily care of your teeth and gums. Regular physical activity. Eating a healthy diet. Avoiding tobacco and drug use. Limiting alcohol use. Practicing safe sex. Taking low doses of aspirin every day. Taking vitamin and mineral supplements as recommended by your health care provider. What happens during an annual well check? The services and screenings done by your health care provider during your annual well check will depend on your age, overall health, lifestyle risk factors, and family history of disease. Counseling  Your health care provider may ask you questions about your: Alcohol  use. Tobacco use. Drug use. Emotional well-being. Home and relationship well-being. Sexual activity. Eating habits. History of falls. Memory and ability to understand (cognition). Work and work Statistician. Screening  You may have the following tests or measurements: Height, weight, and BMI. Blood pressure. Lipid and cholesterol levels. These may be checked every 5 years, or more frequently if you are over 28 years old. Skin check. Lung cancer screening. You may have this screening every year starting at age 95 if you have a 30-pack-year history of smoking and currently smoke or have quit within the past 15 years. Fecal occult blood test (FOBT) of the stool. You may have this test every year starting at age 21. Flexible sigmoidoscopy or colonoscopy. You may have a sigmoidoscopy every 5 years or a colonoscopy every 10 years starting at age 59. Prostate cancer screening. Recommendations will vary depending on your family history and other risks. Hepatitis C blood test. Hepatitis B blood test. Sexually transmitted disease (STD) testing. Diabetes screening. This is done by checking your blood sugar (glucose) after you have not eaten for a while (fasting). You may have this done every 1-3 years. Abdominal aortic aneurysm (AAA) screening. You may need this if you are a current or former smoker. Osteoporosis. You may be screened starting at age 74 if you are at high risk. Talk with your health care provider about your test results, treatment options, and if necessary, the need for more tests. Vaccines  Your health care provider may recommend certain vaccines, such as: Influenza vaccine. This is recommended every year. Tetanus, diphtheria, and acellular pertussis (Tdap, Td) vaccine. You may need a Td booster every 10 years. Zoster vaccine. You may need this after age 41. Pneumococcal 13-valent conjugate (PCV13) vaccine. One dose is recommended after age 26. Pneumococcal polysaccharide  (PPSV23) vaccine. One dose is recommended after age 68. Talk to your health care provider about which screenings and vaccines you need and how often you need them. This information is not intended to replace advice given to you by your health care provider. Make sure you discuss any questions you have with your health care provider. Document Released: 11/21/2015 Document Revised: 07/14/2016 Document Reviewed: 08/26/2015 Elsevier Interactive Patient Education  2017 [place] Prevention in the Home Falls can cause injuries. They can happen to people of all ages. There are many things you can do to make your home safe and to help prevent falls. What can I do on the outside of my home? Regularly fix the edges of walkways and driveways and fix any cracks. Remove anything that might make you trip as you walk through a door, such as a raised step or threshold. Trim any bushes or trees on the path to your home. Use bright outdoor lighting. Clear any walking paths of anything that might make someone trip, such as rocks or tools. Regularly check to see if handrails are loose or broken. Make sure that both sides of any steps have handrails. Any raised decks and porches should have guardrails on the edges. Have any leaves, snow, or ice cleared regularly. Use sand or salt on walking paths during winter. Clean up any spills in your garage right away. This includes oil or grease spills. What can I do in the bathroom? Use night lights. Install grab bars by the toilet and in the tub and shower. Do not use towel bars as grab bars. Use non-skid mats or decals in the tub or shower. If you need to sit down in the shower, use a plastic, non-slip stool. Keep the floor dry. Clean up any water that spills on the floor as soon as it happens. Remove soap buildup in the tub or shower regularly. Attach bath mats securely with double-sided non-slip rug tape. Do not have throw rugs and other things on the  floor that can make you trip. What can I do in the bedroom? Use night lights. Make sure that you have a light by your bed that is easy to reach. Do not use any sheets or blankets that are too big for your bed. They should not hang down onto the floor. Have a firm chair that has side arms. You can use this for support while you get dressed. Do not have throw rugs and other things on the floor that can make you trip. What can I do in the kitchen? Clean up any spills right away. Avoid walking on wet floors. Keep items that you use a lot in easy-to-reach places. If you need to reach something above you, use a strong step stool that has a grab bar. Keep electrical cords out of the way. Do not use floor polish or wax that makes floors slippery. If you must use wax, use non-skid floor wax. Do not have throw rugs and other things on the floor that can make you trip. What can I do with my stairs? Do not leave any items on the stairs. Make sure that there are  handrails on both sides of the stairs and use them. Fix handrails that are broken or loose. Make sure that handrails are as long as the stairways. Check any carpeting to make sure that it is firmly attached to the stairs. Fix any carpet that is loose or worn. Avoid having throw rugs at the top or bottom of the stairs. If you do have throw rugs, attach them to the floor with carpet tape. Make sure that you have a light switch at the top of the stairs and the bottom of the stairs. If you do not have them, ask someone to add them for you. What else can I do to help prevent falls? Wear shoes that: Do not have high heels. Have rubber bottoms. Are comfortable and fit you well. Are closed at the toe. Do not wear sandals. If you use a stepladder: Make sure that it is fully opened. Do not climb a closed stepladder. Make sure that both sides of the stepladder are locked into place. Ask someone to hold it for you, if possible. Clearly mark and make  sure that you can see: Any grab bars or handrails. First and last steps. Where the edge of each step is. Use tools that help you move around (mobility aids) if they are needed. These include: Canes. Walkers. Scooters. Crutches. Turn on the lights when you go into a dark area. Replace any light bulbs as soon as they burn out. Set up your furniture so you have a clear path. Avoid moving your furniture around. If any of your floors are uneven, fix them. If there are any pets around you, be aware of where they are. Review your medicines with your doctor. Some medicines can make you feel dizzy. This can increase your chance of falling. Ask your doctor what other things that you can do to help prevent falls. This information is not intended to replace advice given to you by your health care provider. Make sure you discuss any questions you have with your health care provider. Document Released: 08/21/2009 Document Revised: 04/01/2016 Document Reviewed: 11/29/2014 Elsevier Interactive Patient Education  2017 [employer].

## 2021-11-27 ENCOUNTER — Ambulatory Visit (INDEPENDENT_AMBULATORY_CARE_PROVIDER_SITE_OTHER): Payer: Medicare Other | Admitting: *Deleted

## 2021-11-27 ENCOUNTER — Other Ambulatory Visit: Payer: Self-pay

## 2021-11-27 DIAGNOSIS — I480 Paroxysmal atrial fibrillation: Secondary | ICD-10-CM | POA: Diagnosis not present

## 2021-11-27 DIAGNOSIS — Z5181 Encounter for therapeutic drug level monitoring: Secondary | ICD-10-CM

## 2021-11-27 LAB — POCT INR: INR: 3.2 — AB (ref 2.0–3.0)

## 2021-11-27 NOTE — Patient Instructions (Addendum)
Description   Today take 1 tablet of Warfarin then start taking Warfarin 1 tablet daily except for 1.5 tablets on Mondays and Fridays. Add a salad or green leafy veggie to your diet weekly. Recheck INR in 3 weeks (pt typically 6 weeks) . Call with any questions or concerns [phone]

## 2021-12-02 DIAGNOSIS — M25511 Pain in right shoulder: Secondary | ICD-10-CM | POA: Diagnosis not present

## 2021-12-18 ENCOUNTER — Other Ambulatory Visit: Payer: Self-pay

## 2021-12-18 ENCOUNTER — Ambulatory Visit (INDEPENDENT_AMBULATORY_CARE_PROVIDER_SITE_OTHER): Payer: Medicare Other

## 2021-12-18 DIAGNOSIS — Z5181 Encounter for therapeutic drug level monitoring: Secondary | ICD-10-CM

## 2021-12-18 DIAGNOSIS — I480 Paroxysmal atrial fibrillation: Secondary | ICD-10-CM | POA: Diagnosis not present

## 2021-12-18 LAB — POCT INR: INR: 3.1 — AB (ref 2.0–3.0)

## 2021-12-18 NOTE — Patient Instructions (Signed)
Description   Only take 1 tablet today and then continue taking Warfarin 1 tablet daily except for 1.5 tablets on Mondays and Fridays. Add a salad or green leafy veggie to your diet weekly. Recheck INR in 4 weeks (pt typically 6 weeks) . Call with any questions or concerns [phone]

## 2021-12-28 DIAGNOSIS — H401132 Primary open-angle glaucoma, bilateral, moderate stage: Secondary | ICD-10-CM | POA: Diagnosis not present

## 2022-01-11 ENCOUNTER — Telehealth: Payer: Self-pay | Admitting: Pharmacist

## 2022-01-11 NOTE — Chronic Care Management (AMB) (Signed)
? ? ?Chronic Care Management ?Pharmacy Assistant  ? ?Name: [name] [name]  MRN: [id number] DOB: [birth year] ? ?Reason for Encounter: Disease State Hypertension Assessment ?  ?Conditions to be addressed/monitored: ?HTN ? ?Recent office visits:  ?11/26/21 [name] Peaches, LPN - Patient presented for Medicare Annual Wellness Exam. No medication changes. ? ?Recent consult visits:  ?12/18/21 [name] Romberg, RN (Cardio) - Patient presented for Anti Coag visit reading was 3.1. ? ?11/25/21 [name] [name] (Ophthalmology) - Patient presented for Glaucoma Evaluation. Prescribed Combigan . ? ?11/16/21 [name] Breeding, MD (Cardiology) - Patient presented for PAF and other concerns. No medication changes. ? ?10/22/21 [name] [name] (Ophthalmology) - Patient presented for Glaucoma Evaluation. Prescribed Alphagan . ? ? ?Hospital visits:  ?Medication Reconciliation was completed by comparing discharge summary, patient?s EMR and Pharmacy list, and upon discussion with patient. ? ?Patient presented to [facility] on 10/02/21 due to Abscess. Patient was present for 31 min. ? ?New?Medications Started at [facility] Discharge:?? ?-started  ?Clindamycin ? ?Medication Changes at Hospital Discharge: ?-Changed  ?none ? ?Medications Discontinued at Hospital Discharge: ?-Stopped  ?none ? ?Medications that remain the same after Hospital Discharge:??  ?-All other medications will remain the same.   ? ?Medications: ?Outpatient Encounter Medications as of 01/11/2022  ?Medication Sig  ? atorvastatin (LIPITOR) 80 MG tablet Take 1 tablet (80 mg total) by mouth daily.  ? CLARITIN 10 MG CAPS Take 1 capsule by mouth at bedtime.  ? [facility] ACETAMINOPHEN 325 MG CAPS Take 1 capsule by mouth 2 (two) times daily.   ? diltiazem (CARDIZEM) 30 MG tablet Take 1 tablet every 4 hours AS NEEDED for afib heart rate over 100  ? finasteride (PROSCAR) 5 MG tablet Take 5 mg daily by mouth.  ? furosemide (LASIX) 40 MG tablet Take 1 tablet (40 mg total)  by mouth daily as needed for fluid (increased shortness of breath, leg edema, weight gain > 5 pounds in 1 week).  ? latanoprost (XALATAN) 0.005 % ophthalmic solution Place 1 drop into both eyes at bedtime.   ? lisinopril (PRINIVIL,ZESTRIL) 10 MG tablet Take 10 mg by mouth at bedtime.   ? metoprolol tartrate (LOPRESSOR) 12.5 mg TABS tablet Take 12.5 mg by mouth 2 (two) times daily.  ? mometasone (NASONEX) 50 MCG/ACT nasal spray Place 2 sprays into both nostrils daily as needed (allergies).  ? Multiple Vitamins-Minerals (CENTRUM SILVER PO) Take 1 tablet by mouth every morning.  ? pantoprazole (PROTONIX) 40 MG tablet Take 1 tablet (40 mg total) by mouth daily.  ? potassium chloride (KLOR-CON) 10 MEQ CR tablet Take 10 mEq by mouth every morning.  ? tamsulosin (FLOMAX) 0.4 MG CAPS Take 0.4 mg by mouth 2 (two) times daily.   ? TOLAK 4 % CREA Apply 1 application topically daily as needed (rough place on scalp).  ? warfarin (COUMADIN) 5 MG tablet TAKE 1-2 TABLETS BY MOUTH DAILY AS DIRECTED BY THE [facility]MDADIN CLINIC  ? ?No facility-administered encounter medications on file as of 01/11/2022.  ?Reviewed chart prior to disease state call. Spoke with patient regarding BP ? ?Recent Office Vitals: ?BP Readings from Last 3 Encounters:  ?11/16/21 126/60  ?10/12/21 (!) 148/70  ?10/02/21 (!) 147/67  ? ?Pulse Readings from Last 3 Encounters:  ?11/16/21 (!) 56  ?10/02/21 (!) 59  ?09/16/21 (!) 59  ?  ?Wt Readings from Last 3 Encounters:  ?11/26/21 195 lb (88.5 kg)  ?11/16/21 195 lb (88.5 kg)  ?10/02/21 195 lb 15.8 oz (88.9 kg)  ?  ? ?  Kidney Function ?Lab Results  ?Component Value Date/Time  ? CREATININE 1.01 09/16/2021 08:35 AM  ? CREATININE 1.14 06/10/2021 01:44 AM  ? CREATININE 1.07 09/15/2020 09:34 AM  ? CREATININE 1.02 03/15/2016 11:18 AM  ? GFR 65.61 09/16/2021 08:35 AM  ? GFRNONAA >60 06/10/2021 01:44 AM  ? GFRNONAA 61 09/15/2020 09:34 AM  ? GFRAA 71 09/15/2020 09:34 AM  ? ? ?BMP Latest Ref Rng & Units 09/16/2021 06/10/2021 06/09/2021   ?Glucose 70 - 99 mg/dL 104(H) 113(H) 159(H)  ?BUN 6 - 23 mg/dL '23 20 19  '$ ?Creatinine 0.40 - 1.50 mg/dL 1.01 1.14 1.09  ?BUN/Creat Ratio 6 - 22 (calc) - - -  ?Sodium 135 - 145 mEq/L 138 141 137  ?Potassium 3.5 - 5.1 mEq/L 4.2 3.9 4.0  ?Chloride 96 - 112 mEq/L 99 101 103  ?CO2 19 - 32 mEq/L 31 32 28  ?Calcium 8.4 - 10.5 mg/dL 9.2 9.4 9.3  ? ? ?Current antihypertensive regimen:  ?Lisinopril 20 mg 1/2 tablet daily ?Metoprolol tartrate 12.5 mg 1 tablet twice daily ?How often are you checking your Blood Pressure? weekly ?Current home BP readings: 125/50, 124/60, 129/58, 121/51 ?What recent interventions/DTPs have been made by any provider to improve Blood Pressure control since last CPP Visit: Patient reports no changes for this. He reports he has changed eye drops and it seems to be helping him. He denies any hyper/hypotensive symptoms. ?Any recent hospitalizations or ED visits since last visit with CPP? No ?What exercise is being done to improve your Blood Pressure Control?  ?Patient reports he is not all the way steady on his feet just got a walking stick for his last birthday. ? ?Adherence Review: ?Is the patient currently on ACE/ARB medication? Yes ?Does the patient have >5 day gap between last estimated fill dates? Yes ? ? ? ? ?Care Gaps: ?BP- 120/60 ( 11/16/21) ?AWV- 1/23 ?CCM- declined at this time ? ?Star Rating Drugs: ?Atorvastatin 80 mg - Last filled at [place] ?Lisinopril 10 mg - Last filled at [place] ? ? ? ?Ned Clines CMA ?Clinical Pharmacist Assistant ?[phone] ? ?

## 2022-01-15 ENCOUNTER — Other Ambulatory Visit: Payer: Self-pay

## 2022-01-15 ENCOUNTER — Ambulatory Visit (INDEPENDENT_AMBULATORY_CARE_PROVIDER_SITE_OTHER): Payer: Medicare Other

## 2022-01-15 DIAGNOSIS — I480 Paroxysmal atrial fibrillation: Secondary | ICD-10-CM | POA: Diagnosis not present

## 2022-01-15 DIAGNOSIS — Z5181 Encounter for therapeutic drug level monitoring: Secondary | ICD-10-CM | POA: Diagnosis not present

## 2022-01-15 LAB — POCT INR: INR: 2.8 (ref 2.0–3.0)

## 2022-01-15 NOTE — Patient Instructions (Signed)
Description   ?Continue on same dosage of Warfarin 1 tablet daily except for 1.5 tablets on Mondays and Fridays.  Recheck INR in 5 weeks (pt typically 6 weeks) . Call with any questions or concerns [phone]  ?  ?  ?

## 2022-01-19 DIAGNOSIS — Z85828 Personal history of other malignant neoplasm of skin: Secondary | ICD-10-CM | POA: Diagnosis not present

## 2022-01-19 DIAGNOSIS — L819 Disorder of pigmentation, unspecified: Secondary | ICD-10-CM | POA: Diagnosis not present

## 2022-01-19 DIAGNOSIS — I872 Venous insufficiency (chronic) (peripheral): Secondary | ICD-10-CM | POA: Diagnosis not present

## 2022-01-19 DIAGNOSIS — L821 Other seborrheic keratosis: Secondary | ICD-10-CM | POA: Diagnosis not present

## 2022-01-19 DIAGNOSIS — L814 Other melanin hyperpigmentation: Secondary | ICD-10-CM | POA: Diagnosis not present

## 2022-01-19 DIAGNOSIS — D225 Melanocytic nevi of trunk: Secondary | ICD-10-CM | POA: Diagnosis not present

## 2022-01-19 DIAGNOSIS — L570 Actinic keratosis: Secondary | ICD-10-CM | POA: Diagnosis not present

## 2022-01-19 DIAGNOSIS — Z08 Encounter for follow-up examination after completed treatment for malignant neoplasm: Secondary | ICD-10-CM | POA: Diagnosis not present

## 2022-01-19 DIAGNOSIS — D492 Neoplasm of unspecified behavior of bone, soft tissue, and skin: Secondary | ICD-10-CM | POA: Diagnosis not present

## 2022-01-19 DIAGNOSIS — C44729 Squamous cell carcinoma of skin of left lower limb, including hip: Secondary | ICD-10-CM | POA: Diagnosis not present

## 2022-02-11 DIAGNOSIS — C44729 Squamous cell carcinoma of skin of left lower limb, including hip: Secondary | ICD-10-CM | POA: Diagnosis not present

## 2022-02-11 DIAGNOSIS — D0472 Carcinoma in situ of skin of left lower limb, including hip: Secondary | ICD-10-CM | POA: Diagnosis not present

## 2022-02-19 ENCOUNTER — Ambulatory Visit (INDEPENDENT_AMBULATORY_CARE_PROVIDER_SITE_OTHER): Payer: Medicare Other | Admitting: *Deleted

## 2022-02-19 DIAGNOSIS — Z5181 Encounter for therapeutic drug level monitoring: Secondary | ICD-10-CM | POA: Diagnosis not present

## 2022-02-19 DIAGNOSIS — I480 Paroxysmal atrial fibrillation: Secondary | ICD-10-CM | POA: Diagnosis not present

## 2022-02-19 LAB — POCT INR: INR: 2.7 (ref 2.0–3.0)

## 2022-02-19 NOTE — Patient Instructions (Signed)
Description   ?Continue on same dosage of Warfarin 1 tablet daily except for 1.5 tablets on Mondays and Fridays.  Recheck INR in 6 weeks. Call with any questions or concerns [phone]  ?  ? ? ?

## 2022-02-22 ENCOUNTER — Ambulatory Visit (INDEPENDENT_AMBULATORY_CARE_PROVIDER_SITE_OTHER): Payer: Medicare Other | Admitting: Family Medicine

## 2022-02-22 ENCOUNTER — Encounter: Payer: Self-pay | Admitting: Family Medicine

## 2022-02-22 VITALS — BP 128/64 | HR 52 | Temp 98.8°F | Wt 200.0 lb

## 2022-02-22 DIAGNOSIS — H401130 Primary open-angle glaucoma, bilateral, stage unspecified: Secondary | ICD-10-CM | POA: Diagnosis not present

## 2022-02-22 DIAGNOSIS — I2510 Atherosclerotic heart disease of native coronary artery without angina pectoris: Secondary | ICD-10-CM

## 2022-02-22 DIAGNOSIS — R42 Dizziness and giddiness: Secondary | ICD-10-CM | POA: Diagnosis not present

## 2022-02-22 NOTE — Progress Notes (Signed)
? ?  Subjective:  ? ? Patient ID: [name] [name], male    DOB: 1929-09-13, 88 y.o.   MRN: [id number] ? ?HPI ?Here with his daughter for 2 months of intermittent dizziness or loss of equilibrium. He says there are times when he simply loses his balance, but he denies ever feeling like the room is spinning. He has fallen twice at home in the last 2 weeks, but he did not injure himself. No headaches or blurred vision. No SOB or chest pains. His atrial fibrillation has not been am issue for some months now. He sees Dr. [name] for care of his glaucoma, and he had been stable for several years on Latanoprost drops. Then 2 months ago his pressures began to climb so Bromonidine drops were added to these. This combination was not helping, so a few weeks ago the Bromonidine was stopped and he was started on Timolol drops. Now his pressures are back in the target range.  ? ? ?Review of Systems  ?Constitutional: Negative.   ?Eyes:  Negative for photophobia, pain, redness and visual disturbance.  ?Respiratory: Negative.    ?Cardiovascular: Negative.   ?Neurological:  Positive for dizziness. Negative for seizures, syncope, facial asymmetry, speech difficulty, weakness, light-headedness and headaches.  ? ?   ?Objective:  ? Physical Exam ?Constitutional:   ?   General: He is not in acute distress. ?   Appearance: Normal appearance.  ?Eyes:  ?   Extraocular Movements: Extraocular movements intact.  ?   Conjunctiva/sclera: Conjunctivae normal.  ?   Pupils: Pupils are equal, round, and reactive to light.  ?Neck:  ?   Vascular: No carotid bruit.  ?Cardiovascular:  ?   Rate and Rhythm: Normal rate and regular rhythm.  ?   Pulses: Normal pulses.  ?   Heart sounds: Normal heart sounds.  ?Pulmonary:  ?   Effort: Pulmonary effort is normal.  ?   Breath sounds: Normal breath sounds.  ?Lymphadenopathy:  ?   Cervical: No cervical adenopathy.  ?Neurological:  ?   General: No focal deficit present.  ?   Mental Status: He is alert and oriented to  person, place, and time.  ?   Cranial Nerves: No cranial nerve deficit.  ?   Motor: No weakness.  ?   Coordination: Coordination normal.  ?   Gait: Gait normal.  ? ? ? ? ? ?   ?Assessment & Plan:  ?He has had some dysequilibrium for 2 months, and I believe this is a side effect of his glaucoma drops. I asked him to check with Dr. [name] to see if there are other treatment options he could try. Also his last carotid dopplers in 2017 showed 1-39% stenoses on both sides. We will order another of these studies.  ?[name], MD ? ? ?

## 2022-03-07 ENCOUNTER — Encounter (HOSPITAL_COMMUNITY): Payer: Self-pay

## 2022-03-07 ENCOUNTER — Other Ambulatory Visit: Payer: Self-pay

## 2022-03-07 ENCOUNTER — Emergency Department (HOSPITAL_COMMUNITY)
Admission: EM | Admit: 2022-03-07 | Discharge: 2022-03-07 | Disposition: A | Discharge status: Home / Self Care | Payer: Medicare Other | Attending: Emergency Medicine | Admitting: Emergency Medicine

## 2022-03-07 ENCOUNTER — Emergency Department (HOSPITAL_COMMUNITY): Payer: Medicare Other

## 2022-03-07 DIAGNOSIS — R42 Dizziness and giddiness: Secondary | ICD-10-CM | POA: Diagnosis not present

## 2022-03-07 DIAGNOSIS — I10 Essential (primary) hypertension: Secondary | ICD-10-CM | POA: Insufficient documentation

## 2022-03-07 DIAGNOSIS — D649 Anemia, unspecified: Secondary | ICD-10-CM | POA: Diagnosis not present

## 2022-03-07 DIAGNOSIS — I4891 Unspecified atrial fibrillation: Secondary | ICD-10-CM | POA: Diagnosis not present

## 2022-03-07 DIAGNOSIS — R000 Tachycardia, unspecified: Secondary | ICD-10-CM | POA: Diagnosis not present

## 2022-03-07 DIAGNOSIS — I2510 Atherosclerotic heart disease of native coronary artery without angina pectoris: Secondary | ICD-10-CM | POA: Diagnosis not present

## 2022-03-07 DIAGNOSIS — Z79899 Other long term (current) drug therapy: Secondary | ICD-10-CM | POA: Diagnosis not present

## 2022-03-07 DIAGNOSIS — R29818 Other symptoms and signs involving the nervous system: Secondary | ICD-10-CM | POA: Diagnosis not present

## 2022-03-07 DIAGNOSIS — Z20822 Contact with and (suspected) exposure to covid-19: Secondary | ICD-10-CM | POA: Diagnosis not present

## 2022-03-07 DIAGNOSIS — R2689 Other abnormalities of gait and mobility: Secondary | ICD-10-CM | POA: Diagnosis not present

## 2022-03-07 DIAGNOSIS — Z7901 Long term (current) use of anticoagulants: Secondary | ICD-10-CM | POA: Insufficient documentation

## 2022-03-07 DIAGNOSIS — H547 Unspecified visual loss: Secondary | ICD-10-CM | POA: Diagnosis not present

## 2022-03-07 DIAGNOSIS — H538 Other visual disturbances: Secondary | ICD-10-CM | POA: Insufficient documentation

## 2022-03-07 DIAGNOSIS — I6782 Cerebral ischemia: Secondary | ICD-10-CM | POA: Diagnosis not present

## 2022-03-07 DIAGNOSIS — R262 Difficulty in walking, not elsewhere classified: Secondary | ICD-10-CM

## 2022-03-07 DIAGNOSIS — G939 Disorder of brain, unspecified: Secondary | ICD-10-CM | POA: Diagnosis not present

## 2022-03-07 DIAGNOSIS — I672 Cerebral atherosclerosis: Secondary | ICD-10-CM | POA: Diagnosis not present

## 2022-03-07 DIAGNOSIS — D329 Benign neoplasm of meninges, unspecified: Secondary | ICD-10-CM | POA: Diagnosis not present

## 2022-03-07 DIAGNOSIS — R001 Bradycardia, unspecified: Secondary | ICD-10-CM | POA: Diagnosis not present

## 2022-03-07 LAB — URINALYSIS, ROUTINE W REFLEX MICROSCOPIC
Bilirubin Urine: NEGATIVE
Glucose, UA: NEGATIVE mg/dL
Hgb urine dipstick: NEGATIVE
Ketones, ur: NEGATIVE mg/dL
Leukocytes,Ua: NEGATIVE
Nitrite: NEGATIVE
Protein, ur: NEGATIVE mg/dL
Specific Gravity, Urine: 1.016 (ref 1.005–1.030)
pH: 7 (ref 5.0–8.0)

## 2022-03-07 LAB — DIFFERENTIAL
Abs Immature Granulocytes: 0.02 10*3/uL (ref 0.00–0.07)
Basophils Absolute: 0 10*3/uL (ref 0.0–0.1)
Basophils Relative: 0 %
Eosinophils Absolute: 0.2 10*3/uL (ref 0.0–0.5)
Eosinophils Relative: 2 %
Immature Granulocytes: 0 %
Lymphocytes Relative: 15 %
Lymphs Abs: 1.6 10*3/uL (ref 0.7–4.0)
Monocytes Absolute: 1.2 10*3/uL — ABNORMAL HIGH (ref 0.1–1.0)
Monocytes Relative: 12 %
Neutro Abs: 7.2 10*3/uL (ref 1.7–7.7)
Neutrophils Relative %: 71 %

## 2022-03-07 LAB — COMPREHENSIVE METABOLIC PANEL
ALT: 28 U/L (ref 0–44)
AST: 25 U/L (ref 15–41)
Albumin: 3.7 g/dL (ref 3.5–5.0)
Alkaline Phosphatase: 61 U/L (ref 38–126)
Anion gap: 6 (ref 5–15)
BUN: 20 mg/dL (ref 8–23)
CO2: 28 mmol/L (ref 22–32)
Calcium: 8.9 mg/dL (ref 8.9–10.3)
Chloride: 106 mmol/L (ref 98–111)
Creatinine, Ser: 0.98 mg/dL (ref 0.61–1.24)
GFR, Estimated: >60 mL/min (ref >60)
Glucose, Bld: 130 mg/dL — ABNORMAL HIGH (ref 70–99)
Potassium: 3.8 mmol/L (ref 3.5–5.1)
Sodium: 140 mmol/L (ref 135–145)
Total Bilirubin: 0.7 mg/dL (ref 0.3–1.2)
Total Protein: 5.7 g/dL — ABNORMAL LOW (ref 6.5–8.1)

## 2022-03-07 LAB — RAPID URINE DRUG SCREEN, HOSP PERFORMED
Amphetamines: NOT DETECTED
Barbiturates: NOT DETECTED
Benzodiazepines: NOT DETECTED
Cocaine: NOT DETECTED
Opiates: NOT DETECTED
Tetrahydrocannabinol: NOT DETECTED

## 2022-03-07 LAB — RESP PANEL BY RT-PCR (FLU A&B, COVID) ARPGX2
Influenza A by PCR: NEGATIVE
Influenza B by PCR: NEGATIVE
SARS Coronavirus 2 by RT PCR: NEGATIVE

## 2022-03-07 LAB — CBC
HCT: 37.1 % — ABNORMAL LOW (ref 39.0–52.0)
Hemoglobin: 12.3 g/dL — ABNORMAL LOW (ref 13.0–17.0)
MCH: 33 pg (ref 26.0–34.0)
MCHC: 33.2 g/dL (ref 30.0–36.0)
MCV: 99.5 fL (ref 80.0–100.0)
Platelets: 162 10*3/uL (ref 150–400)
RBC: 3.73 MIL/uL — ABNORMAL LOW (ref 4.22–5.81)
RDW: 13.2 % (ref 11.5–15.5)
WBC: 10.1 10*3/uL (ref 4.0–10.5)
nRBC: 0 % (ref 0.0–0.2)

## 2022-03-07 LAB — I-STAT CHEM 8, ED
BUN: 22 mg/dL (ref 8–23)
Calcium, Ion: 1.17 mmol/L (ref 1.15–1.40)
Chloride: 100 mmol/L (ref 98–111)
Creatinine, Ser: 1 mg/dL (ref 0.61–1.24)
Glucose, Bld: 133 mg/dL — ABNORMAL HIGH (ref 70–99)
HCT: 37 % — ABNORMAL LOW (ref 39.0–52.0)
Hemoglobin: 12.6 g/dL — ABNORMAL LOW (ref 13.0–17.0)
Potassium: 3.8 mmol/L (ref 3.5–5.1)
Sodium: 140 mmol/L (ref 135–145)
TCO2: 28 mmol/L (ref 22–32)

## 2022-03-07 LAB — PROTIME-INR
INR: 3.2 — ABNORMAL HIGH (ref 0.8–1.2)
Prothrombin Time: 32.2 seconds — ABNORMAL HIGH (ref 11.4–15.2)

## 2022-03-07 LAB — APTT: aPTT: 40 seconds — ABNORMAL HIGH (ref 24–36)

## 2022-03-07 MED ORDER — LORAZEPAM 2 MG/ML IJ SOLN
1.0000 mg | Freq: Once | INTRAMUSCULAR | Status: AC | PRN
  Administered 2022-03-07: 1 mg via INTRAVENOUS
  Filled 2022-03-07: qty 1

## 2022-03-07 MED ORDER — SODIUM CHLORIDE 0.9 % IV BOLUS
500.0000 mL | Freq: Once | INTRAVENOUS | Status: AC
  Administered 2022-03-07: 500 mL via INTRAVENOUS

## 2022-03-07 MED ORDER — SODIUM CHLORIDE 0.9 % IV SOLN
100.0000 mL/h | INTRAVENOUS | Status: DC
  Administered 2022-03-07: 100 mL/h via INTRAVENOUS

## 2022-03-07 NOTE — ED Provider Notes (Signed)
?[place] ?Provider Note ? ? ?CSN: [id number] ?Arrival date & time: 03/07/22  1219 ? ?  ? ?History ? ?Chief Complaint  ?Patient presents with  ? Dizziness  ? ? ?[name] [name] is a 88 y.o. male. ? ?Pt is a 88 yo male with a pmhx significant for glaucoma, pafib (on coumadin), hyperlipidemia, htn, gerd, kidney stones, cad, and melanoma.  Pt said he has had some blurry vision and has been feeling dizzy.  He had some trouble walking last night around 1730 and this morning.  He went to see his pcp for this problem on 4/17.  His pcp thought it may be due to his glaucoma drops.  His eye doctor (Dr. [name]) told pt to stop the Timolol and go back on Latanoprost.  Pt has been doing that since the 17th.  He does not feel any different. ? ? ?  ? ?Home Medications ?Prior to Admission medications   ?Medication Sig Start Date End Date Taking? Authorizing Provider  ?atorvastatin (LIPITOR) 80 MG tablet Take 1 tablet (80 mg total) by mouth daily. 04/19/16   Eileen Stanford, PA-C  ?brimonidine-timolol (COMBIGAN) 0.2-0.5 % ophthalmic solution SMARTSIG:1 Drop(s) In Eye(s) Morning-Night 12/11/21   [provider]  ?CLARITIN 10 MG CAPS Take 1 capsule by mouth at bedtime. 08/08/19   [provider]  ?[employer] ACETAMINOPHEN 325 MG CAPS Take 1 capsule by mouth 2 (two) times daily.  08/08/19   [provider]  ?diltiazem (CARDIZEM) 30 MG tablet Take 1 tablet every 4 hours AS NEEDED for afib heart rate over 100 08/08/17   Sherran Needs, NP  ?finasteride (PROSCAR) 5 MG tablet Take 5 mg daily by mouth.    [provider]  ?furosemide (LASIX) 40 MG tablet Take 1 tablet (40 mg total) by mouth daily as needed for fluid (increased shortness of breath, leg edema, weight gain > 5 pounds in 1 week). 06/10/21 06/10/22  Margie Billet, NP  ?latanoprost (XALATAN) 0.005 % ophthalmic solution Place 1 drop into both eyes at bedtime.     [provider]  ?lisinopril (PRINIVIL,ZESTRIL)  10 MG tablet Take 10 mg by mouth at bedtime.     [provider]  ?metoprolol tartrate (LOPRESSOR) 12.5 mg TABS tablet Take 12.5 mg by mouth 2 (two) times daily.    [provider]  ?mometasone (NASONEX) 50 MCG/ACT nasal spray Place 2 sprays into both nostrils daily as needed (allergies).    [provider]  ?Multiple Vitamins-Minerals (CENTRUM SILVER PO) Take 1 tablet by mouth every morning.    [provider]  ?pantoprazole (PROTONIX) 40 MG tablet Take 1 tablet (40 mg total) by mouth daily. 04/19/16   Eileen Stanford, PA-C  ?potassium chloride (KLOR-CON) 10 MEQ CR tablet Take 10 mEq by mouth every morning.    [provider]  ?tamsulosin (FLOMAX) 0.4 MG CAPS Take 0.4 mg by mouth 2 (two) times daily.     [provider]  ?TOLAK 4 % CREA Apply 1 application topically daily as needed (rough place on scalp). 08/08/19   [provider]  ?warfarin (COUMADIN) 5 MG tablet TAKE 1-2 TABLETS BY MOUTH DAILY AS DIRECTED BY THE [facility] 09/08/21   [name], MD  ?   ? ?Allergies    ?Sulfa antibiotics and Sulfamethoxazole   ? ?Review of Systems   ?Review of Systems  ?Eyes:  Positive for visual disturbance.  ?Neurological:  Positive for dizziness.  ?  Gait instability  ?All other systems reviewed and are negative. ? ?Physical Exam ?Updated Vital Signs ?BP 131/66   Pulse 79   Temp 98.5 ?F (36.9 ?C)   Resp 17   SpO2 99%  ?Physical Exam ?Vitals and nursing note reviewed.  ?Constitutional:   ?   Appearance: Normal appearance.  ?HENT:  ?   Head: Normocephalic and atraumatic.  ?   Right Ear: External ear normal.  ?   Left Ear: External ear normal.  ?   Nose: Nose normal.  ?   Mouth/Throat:  ?   Mouth: Mucous membranes are moist.  ?   Pharynx: Oropharynx is clear.  ?Eyes:  ?   Extraocular Movements: Extraocular movements intact.  ?   Conjunctiva/sclera: Conjunctivae normal.  ?   Pupils: Pupils are equal, round, and reactive to light.  ?Cardiovascular:   ?   Rate and Rhythm: Normal rate and regular rhythm.  ?   Pulses: Normal pulses.  ?   Heart sounds: Normal heart sounds.  ?Pulmonary:  ?   Effort: Pulmonary effort is normal.  ?   Breath sounds: Normal breath sounds.  ?Abdominal:  ?   General: Abdomen is flat. Bowel sounds are normal.  ?   Palpations: Abdomen is soft.  ?Musculoskeletal:     ?   General: Normal range of motion.  ?   Cervical back: Normal range of motion and neck supple.  ?Skin: ?   General: Skin is warm.  ?   Capillary Refill: Capillary refill takes less than 2 seconds.  ?Neurological:  ?   General: No focal deficit present.  ?   Mental Status: He is alert and oriented to person, place, and time.  ?Psychiatric:     ?   Mood and Affect: Mood normal.     ?   Behavior: Behavior normal.  ? ? ?ED Results / Procedures / Treatments   ?Labs ?(all labs ordered are listed, but only abnormal results are displayed) ?Labs Reviewed  ?PROTIME-INR - Abnormal; Notable for the following components:  ?    Result Value  ? Prothrombin Time 32.2 (*)   ? INR 3.2 (*)   ? All other components within normal limits  ?APTT - Abnormal; Notable for the following components:  ? aPTT 40 (*)   ? All other components within normal limits  ?CBC - Abnormal; Notable for the following components:  ? RBC 3.73 (*)   ? Hemoglobin 12.3 (*)   ? HCT 37.1 (*)   ? All other components within normal limits  ?DIFFERENTIAL - Abnormal; Notable for the following components:  ? Monocytes Absolute 1.2 (*)   ? All other components within normal limits  ?COMPREHENSIVE METABOLIC PANEL - Abnormal; Notable for the following components:  ? Glucose, Bld 130 (*)   ? Total Protein 5.7 (*)   ? All other components within normal limits  ?URINALYSIS, ROUTINE W REFLEX MICROSCOPIC - Abnormal; Notable for the following components:  ? APPearance HAZY (*)   ? All other components within normal limits  ?I-STAT CHEM 8, ED - Abnormal; Notable for the following components:  ? Glucose, Bld 133 (*)   ? Hemoglobin 12.6 (*)    ? HCT 37.0 (*)   ? All other components within normal limits  ?RESP PANEL BY RT-PCR (FLU A&B, COVID) [id number]  ?RAPID URINE DRUG SCREEN, HOSP PERFORMED  ?ETHANOL  ? ? ?EKG ?EKG Interpretation ? ?Date/Time:  Sunday March 07 2022 08:48:04 EDT ?Ventricular Rate:  60 ?PR Interval:  178 ?QRS Duration: 89 ?QT Interval:  401 ?QTC Calculation: 401 ?R Axis:   3 ?Text Interpretation: Sinus rhythm Low voltage, precordial leads Since last tracing rate slower Confirmed by Isla Pence [phone]) on 03/07/2022 1:31:58 PM ? ?Radiology ?CT HEAD WO CONTRAST ? ?Result Date: 03/07/2022 ?CLINICAL DATA:  Neuro deficit, acute, stroke suspected EXAM: CT HEAD WITHOUT CONTRAST TECHNIQUE: Contiguous axial images were obtained from the base of the skull through the vertex without intravenous contrast. RADIATION DOSE REDUCTION: This exam was performed according to the departmental dose-optimization program which includes automated exposure control, adjustment of the mA and/or kV according to patient size and/or use of iterative reconstruction technique. COMPARISON:  None. FINDINGS: Brain: No evidence of acute infarction, hemorrhage, hydrocephalus, extra-axial collection or mass lesion/mass effect. Patchy low-density changes within the periventricular and subcortical white matter compatible with chronic microvascular ischemic change. Mild-moderate diffuse cerebral volume loss. Vascular: Atherosclerotic calcifications involving the large vessels of the skull base. No unexpected hyperdense vessel. Skull: Normal. Negative for fracture or focal lesion. Sinuses/Orbits: No acute finding. Other: None. IMPRESSION: 1. No acute intracranial abnormality. 2. Chronic microvascular ischemic change and cerebral volume loss. Electronically Signed   By: Davina Poke D.O.   On: 03/07/2022 09:38  ? ?MR BRAIN WO CONTRAST ? ?Result Date: 03/07/2022 ?CLINICAL DATA:  Neuro deficit, acute, stroke suspected EXAM: MRI HEAD WITHOUT CONTRAST TECHNIQUE: Multiplanar,  multiecho pulse sequences of the brain and surrounding structures were obtained without intravenous contrast. COMPARISON:  None. FINDINGS: Brain: There is no acute infarction or intracranial hemorrhage. A 7 mm r

## 2022-03-07 NOTE — ED Triage Notes (Signed)
Pt arrives via ems from home. Pt reports he had blurred vision in both eyes, dizziness, and trouble walking last night around 1730. Pt reports he has had the vision issues for the past couple of weeks. Pt is AxOx4.  ?

## 2022-03-07 NOTE — ED Notes (Signed)
Pt to MRI

## 2022-03-07 NOTE — Care Management (Signed)
Called patient and son due to remote. Patient did not answer, son did, listed [name] [name]. He stated his Brother [name] was picking the patient up, but HH sounds like a good ide" called number for [name] [phone] to call this RNCM to discuss. Sent referral to Angie at [facility] for acceptance, acceptance initiated. . Orders placed and communicated with Provider and RN. Patient discharged.  ?

## 2022-03-08 ENCOUNTER — Encounter: Payer: Self-pay | Admitting: Family Medicine

## 2022-03-09 ENCOUNTER — Encounter: Payer: Self-pay | Admitting: Family Medicine

## 2022-03-09 DIAGNOSIS — I5032 Chronic diastolic (congestive) heart failure: Secondary | ICD-10-CM | POA: Diagnosis not present

## 2022-03-09 DIAGNOSIS — I2510 Atherosclerotic heart disease of native coronary artery without angina pectoris: Secondary | ICD-10-CM | POA: Diagnosis not present

## 2022-03-09 DIAGNOSIS — Z9861 Coronary angioplasty status: Secondary | ICD-10-CM | POA: Diagnosis not present

## 2022-03-09 DIAGNOSIS — K219 Gastro-esophageal reflux disease without esophagitis: Secondary | ICD-10-CM | POA: Diagnosis not present

## 2022-03-09 DIAGNOSIS — I493 Ventricular premature depolarization: Secondary | ICD-10-CM | POA: Diagnosis not present

## 2022-03-09 DIAGNOSIS — Z9181 History of falling: Secondary | ICD-10-CM | POA: Diagnosis not present

## 2022-03-09 DIAGNOSIS — I110 Hypertensive heart disease with heart failure: Secondary | ICD-10-CM | POA: Diagnosis not present

## 2022-03-09 DIAGNOSIS — N138 Other obstructive and reflux uropathy: Secondary | ICD-10-CM | POA: Diagnosis not present

## 2022-03-09 DIAGNOSIS — I495 Sick sinus syndrome: Secondary | ICD-10-CM | POA: Diagnosis not present

## 2022-03-09 DIAGNOSIS — I480 Paroxysmal atrial fibrillation: Secondary | ICD-10-CM | POA: Diagnosis not present

## 2022-03-09 DIAGNOSIS — N401 Enlarged prostate with lower urinary tract symptoms: Secondary | ICD-10-CM | POA: Diagnosis not present

## 2022-03-09 DIAGNOSIS — Z7901 Long term (current) use of anticoagulants: Secondary | ICD-10-CM | POA: Diagnosis not present

## 2022-03-09 DIAGNOSIS — H4010X0 Unspecified open-angle glaucoma, stage unspecified: Secondary | ICD-10-CM | POA: Diagnosis not present

## 2022-03-09 DIAGNOSIS — I700 Atherosclerosis of aorta: Secondary | ICD-10-CM | POA: Diagnosis not present

## 2022-03-10 ENCOUNTER — Telehealth: Payer: Self-pay

## 2022-03-10 NOTE — Telephone Encounter (Signed)
Please okay these orders  ?

## 2022-03-11 ENCOUNTER — Telehealth: Payer: Self-pay | Admitting: Family Medicine

## 2022-03-11 NOTE — Telephone Encounter (Signed)
Leigh ann rn is calling and needs VO for skilled nursing plan of care 1x3 for observation ?

## 2022-03-11 NOTE — Telephone Encounter (Signed)
Please advise 

## 2022-03-12 ENCOUNTER — Telehealth: Payer: Self-pay | Admitting: Family Medicine

## 2022-03-12 ENCOUNTER — Ambulatory Visit (HOSPITAL_COMMUNITY)
Admission: RE | Admit: 2022-03-12 | Discharge: 2022-03-12 | Disposition: A | Discharge status: Home / Self Care | Payer: Medicare Other | Source: Ambulatory Visit | Attending: Cardiovascular Disease | Admitting: Cardiovascular Disease

## 2022-03-12 DIAGNOSIS — I700 Atherosclerosis of aorta: Secondary | ICD-10-CM | POA: Diagnosis not present

## 2022-03-12 DIAGNOSIS — I480 Paroxysmal atrial fibrillation: Secondary | ICD-10-CM | POA: Diagnosis not present

## 2022-03-12 DIAGNOSIS — R42 Dizziness and giddiness: Secondary | ICD-10-CM | POA: Diagnosis not present

## 2022-03-12 DIAGNOSIS — H4010X0 Unspecified open-angle glaucoma, stage unspecified: Secondary | ICD-10-CM | POA: Diagnosis not present

## 2022-03-12 DIAGNOSIS — I5032 Chronic diastolic (congestive) heart failure: Secondary | ICD-10-CM | POA: Diagnosis not present

## 2022-03-12 DIAGNOSIS — I2510 Atherosclerotic heart disease of native coronary artery without angina pectoris: Secondary | ICD-10-CM | POA: Diagnosis not present

## 2022-03-12 DIAGNOSIS — I110 Hypertensive heart disease with heart failure: Secondary | ICD-10-CM | POA: Diagnosis not present

## 2022-03-12 NOTE — Telephone Encounter (Signed)
Verbal orders Already given to [facility] a Physical therapist with [facility] ?

## 2022-03-12 NOTE — Telephone Encounter (Signed)
Spoke with with  Beckie Busing a Physical therapist with [facility] advised that orders requested have been approved by Dr Sarajane Jews ?

## 2022-03-12 NOTE — Telephone Encounter (Signed)
Monique a Physical therapist with [facility] called because she is needing verbals orders frequency one week one, two week four, one week one for strengthening, gait balance training, home exercise program and home safety  ? ? ? ? ?Good callback number is [phone] ?Okay to leave voicemail  ? ? ? ? ?Also wanted it noted that patient had irregular heartbeat which was at rest 119 and at exertion 129  ? ?Patient report that he is in AFIB so normally in the 16s but he did not want to call office about it ? ? ? ?Please advise  ?

## 2022-03-12 NOTE — Telephone Encounter (Signed)
Please okay these orders  ?

## 2022-03-16 DIAGNOSIS — H4010X0 Unspecified open-angle glaucoma, stage unspecified: Secondary | ICD-10-CM | POA: Diagnosis not present

## 2022-03-16 DIAGNOSIS — I5032 Chronic diastolic (congestive) heart failure: Secondary | ICD-10-CM | POA: Diagnosis not present

## 2022-03-16 DIAGNOSIS — I110 Hypertensive heart disease with heart failure: Secondary | ICD-10-CM | POA: Diagnosis not present

## 2022-03-16 DIAGNOSIS — I2510 Atherosclerotic heart disease of native coronary artery without angina pectoris: Secondary | ICD-10-CM | POA: Diagnosis not present

## 2022-03-16 DIAGNOSIS — I700 Atherosclerosis of aorta: Secondary | ICD-10-CM | POA: Diagnosis not present

## 2022-03-16 DIAGNOSIS — I480 Paroxysmal atrial fibrillation: Secondary | ICD-10-CM | POA: Diagnosis not present

## 2022-03-18 ENCOUNTER — Telehealth: Payer: Self-pay | Admitting: Family Medicine

## 2022-03-18 DIAGNOSIS — I5032 Chronic diastolic (congestive) heart failure: Secondary | ICD-10-CM | POA: Diagnosis not present

## 2022-03-18 DIAGNOSIS — H4010X0 Unspecified open-angle glaucoma, stage unspecified: Secondary | ICD-10-CM | POA: Diagnosis not present

## 2022-03-18 DIAGNOSIS — I480 Paroxysmal atrial fibrillation: Secondary | ICD-10-CM | POA: Diagnosis not present

## 2022-03-18 DIAGNOSIS — I2510 Atherosclerotic heart disease of native coronary artery without angina pectoris: Secondary | ICD-10-CM | POA: Diagnosis not present

## 2022-03-18 DIAGNOSIS — I110 Hypertensive heart disease with heart failure: Secondary | ICD-10-CM | POA: Diagnosis not present

## 2022-03-18 DIAGNOSIS — I700 Atherosclerosis of aorta: Secondary | ICD-10-CM | POA: Diagnosis not present

## 2022-03-18 NOTE — Telephone Encounter (Signed)
Sharee Pimple with [facility] is calling and would like md to order pt rollator from [facility] ?

## 2022-03-18 NOTE — Telephone Encounter (Signed)
Please advise 

## 2022-03-19 NOTE — Telephone Encounter (Signed)
Pt order for Rollator walker/ pt demographics and office notes was faxed to [facility] ?

## 2022-03-19 NOTE — Telephone Encounter (Signed)
The order is ready to fax  

## 2022-03-20 ENCOUNTER — Encounter: Payer: Self-pay | Admitting: Family Medicine

## 2022-03-22 DIAGNOSIS — I5032 Chronic diastolic (congestive) heart failure: Secondary | ICD-10-CM | POA: Diagnosis not present

## 2022-03-22 DIAGNOSIS — H4010X0 Unspecified open-angle glaucoma, stage unspecified: Secondary | ICD-10-CM | POA: Diagnosis not present

## 2022-03-22 DIAGNOSIS — I110 Hypertensive heart disease with heart failure: Secondary | ICD-10-CM | POA: Diagnosis not present

## 2022-03-22 DIAGNOSIS — I2510 Atherosclerotic heart disease of native coronary artery without angina pectoris: Secondary | ICD-10-CM | POA: Diagnosis not present

## 2022-03-22 DIAGNOSIS — I700 Atherosclerosis of aorta: Secondary | ICD-10-CM | POA: Diagnosis not present

## 2022-03-22 DIAGNOSIS — I480 Paroxysmal atrial fibrillation: Secondary | ICD-10-CM | POA: Diagnosis not present

## 2022-03-22 NOTE — Telephone Encounter (Signed)
We took care of this last week  ?

## 2022-03-25 ENCOUNTER — Encounter: Payer: Self-pay | Admitting: Family Medicine

## 2022-03-25 DIAGNOSIS — I480 Paroxysmal atrial fibrillation: Secondary | ICD-10-CM | POA: Diagnosis not present

## 2022-03-25 DIAGNOSIS — I5032 Chronic diastolic (congestive) heart failure: Secondary | ICD-10-CM | POA: Diagnosis not present

## 2022-03-25 DIAGNOSIS — I700 Atherosclerosis of aorta: Secondary | ICD-10-CM | POA: Diagnosis not present

## 2022-03-25 DIAGNOSIS — I2510 Atherosclerotic heart disease of native coronary artery without angina pectoris: Secondary | ICD-10-CM | POA: Diagnosis not present

## 2022-03-25 DIAGNOSIS — H4010X0 Unspecified open-angle glaucoma, stage unspecified: Secondary | ICD-10-CM | POA: Diagnosis not present

## 2022-03-25 DIAGNOSIS — I110 Hypertensive heart disease with heart failure: Secondary | ICD-10-CM | POA: Diagnosis not present

## 2022-03-29 NOTE — Telephone Encounter (Signed)
I think taking 1/2 a pill every few days would be fine

## 2022-03-30 DIAGNOSIS — I5032 Chronic diastolic (congestive) heart failure: Secondary | ICD-10-CM | POA: Diagnosis not present

## 2022-03-30 DIAGNOSIS — I110 Hypertensive heart disease with heart failure: Secondary | ICD-10-CM | POA: Diagnosis not present

## 2022-03-30 DIAGNOSIS — I480 Paroxysmal atrial fibrillation: Secondary | ICD-10-CM | POA: Diagnosis not present

## 2022-03-30 DIAGNOSIS — H4010X0 Unspecified open-angle glaucoma, stage unspecified: Secondary | ICD-10-CM | POA: Diagnosis not present

## 2022-03-30 DIAGNOSIS — I2510 Atherosclerotic heart disease of native coronary artery without angina pectoris: Secondary | ICD-10-CM | POA: Diagnosis not present

## 2022-03-30 DIAGNOSIS — I700 Atherosclerosis of aorta: Secondary | ICD-10-CM | POA: Diagnosis not present

## 2022-03-31 DIAGNOSIS — H401132 Primary open-angle glaucoma, bilateral, moderate stage: Secondary | ICD-10-CM | POA: Diagnosis not present

## 2022-04-01 DIAGNOSIS — I2510 Atherosclerotic heart disease of native coronary artery without angina pectoris: Secondary | ICD-10-CM | POA: Diagnosis not present

## 2022-04-01 DIAGNOSIS — I110 Hypertensive heart disease with heart failure: Secondary | ICD-10-CM | POA: Diagnosis not present

## 2022-04-01 DIAGNOSIS — H4010X0 Unspecified open-angle glaucoma, stage unspecified: Secondary | ICD-10-CM | POA: Diagnosis not present

## 2022-04-01 DIAGNOSIS — I5032 Chronic diastolic (congestive) heart failure: Secondary | ICD-10-CM | POA: Diagnosis not present

## 2022-04-01 DIAGNOSIS — I480 Paroxysmal atrial fibrillation: Secondary | ICD-10-CM | POA: Diagnosis not present

## 2022-04-01 DIAGNOSIS — I700 Atherosclerosis of aorta: Secondary | ICD-10-CM | POA: Diagnosis not present

## 2022-04-02 ENCOUNTER — Ambulatory Visit (INDEPENDENT_AMBULATORY_CARE_PROVIDER_SITE_OTHER): Payer: Medicare Other | Admitting: *Deleted

## 2022-04-02 ENCOUNTER — Telehealth: Payer: Self-pay | Admitting: Family Medicine

## 2022-04-02 DIAGNOSIS — N401 Enlarged prostate with lower urinary tract symptoms: Secondary | ICD-10-CM | POA: Diagnosis not present

## 2022-04-02 DIAGNOSIS — Z7901 Long term (current) use of anticoagulants: Secondary | ICD-10-CM

## 2022-04-02 DIAGNOSIS — Z9181 History of falling: Secondary | ICD-10-CM

## 2022-04-02 DIAGNOSIS — N138 Other obstructive and reflux uropathy: Secondary | ICD-10-CM | POA: Diagnosis not present

## 2022-04-02 DIAGNOSIS — I493 Ventricular premature depolarization: Secondary | ICD-10-CM | POA: Diagnosis not present

## 2022-04-02 DIAGNOSIS — I480 Paroxysmal atrial fibrillation: Secondary | ICD-10-CM

## 2022-04-02 DIAGNOSIS — Z9861 Coronary angioplasty status: Secondary | ICD-10-CM | POA: Diagnosis not present

## 2022-04-02 DIAGNOSIS — Z5181 Encounter for therapeutic drug level monitoring: Secondary | ICD-10-CM

## 2022-04-02 DIAGNOSIS — I5032 Chronic diastolic (congestive) heart failure: Secondary | ICD-10-CM | POA: Diagnosis not present

## 2022-04-02 DIAGNOSIS — H4010X0 Unspecified open-angle glaucoma, stage unspecified: Secondary | ICD-10-CM | POA: Diagnosis not present

## 2022-04-02 DIAGNOSIS — I110 Hypertensive heart disease with heart failure: Secondary | ICD-10-CM | POA: Diagnosis not present

## 2022-04-02 DIAGNOSIS — I2510 Atherosclerotic heart disease of native coronary artery without angina pectoris: Secondary | ICD-10-CM | POA: Diagnosis not present

## 2022-04-02 DIAGNOSIS — I495 Sick sinus syndrome: Secondary | ICD-10-CM | POA: Diagnosis not present

## 2022-04-02 DIAGNOSIS — K219 Gastro-esophageal reflux disease without esophagitis: Secondary | ICD-10-CM | POA: Diagnosis not present

## 2022-04-02 DIAGNOSIS — I700 Atherosclerosis of aorta: Secondary | ICD-10-CM | POA: Diagnosis not present

## 2022-04-02 LAB — POCT INR: INR: 2.6 (ref 2.0–3.0)

## 2022-04-02 NOTE — Telephone Encounter (Signed)
Estill Bamberg RN with [facility] is calling to let md know due to pt has coumadin appt she need to move SN appt to 04-07-2022

## 2022-04-02 NOTE — Telephone Encounter (Signed)
FYI

## 2022-04-06 DIAGNOSIS — H4010X0 Unspecified open-angle glaucoma, stage unspecified: Secondary | ICD-10-CM | POA: Diagnosis not present

## 2022-04-06 DIAGNOSIS — I110 Hypertensive heart disease with heart failure: Secondary | ICD-10-CM | POA: Diagnosis not present

## 2022-04-06 DIAGNOSIS — I480 Paroxysmal atrial fibrillation: Secondary | ICD-10-CM | POA: Diagnosis not present

## 2022-04-06 DIAGNOSIS — I5032 Chronic diastolic (congestive) heart failure: Secondary | ICD-10-CM | POA: Diagnosis not present

## 2022-04-06 DIAGNOSIS — I2510 Atherosclerotic heart disease of native coronary artery without angina pectoris: Secondary | ICD-10-CM | POA: Diagnosis not present

## 2022-04-06 DIAGNOSIS — I700 Atherosclerosis of aorta: Secondary | ICD-10-CM | POA: Diagnosis not present

## 2022-04-07 DIAGNOSIS — I2510 Atherosclerotic heart disease of native coronary artery without angina pectoris: Secondary | ICD-10-CM | POA: Diagnosis not present

## 2022-04-07 DIAGNOSIS — I700 Atherosclerosis of aorta: Secondary | ICD-10-CM | POA: Diagnosis not present

## 2022-04-07 DIAGNOSIS — H4010X0 Unspecified open-angle glaucoma, stage unspecified: Secondary | ICD-10-CM | POA: Diagnosis not present

## 2022-04-07 DIAGNOSIS — I110 Hypertensive heart disease with heart failure: Secondary | ICD-10-CM | POA: Diagnosis not present

## 2022-04-07 DIAGNOSIS — I5032 Chronic diastolic (congestive) heart failure: Secondary | ICD-10-CM | POA: Diagnosis not present

## 2022-04-07 DIAGNOSIS — I480 Paroxysmal atrial fibrillation: Secondary | ICD-10-CM | POA: Diagnosis not present

## 2022-04-08 DIAGNOSIS — Z9861 Coronary angioplasty status: Secondary | ICD-10-CM | POA: Diagnosis not present

## 2022-04-08 DIAGNOSIS — K219 Gastro-esophageal reflux disease without esophagitis: Secondary | ICD-10-CM | POA: Diagnosis not present

## 2022-04-08 DIAGNOSIS — N138 Other obstructive and reflux uropathy: Secondary | ICD-10-CM | POA: Diagnosis not present

## 2022-04-08 DIAGNOSIS — I700 Atherosclerosis of aorta: Secondary | ICD-10-CM | POA: Diagnosis not present

## 2022-04-08 DIAGNOSIS — I110 Hypertensive heart disease with heart failure: Secondary | ICD-10-CM | POA: Diagnosis not present

## 2022-04-08 DIAGNOSIS — I480 Paroxysmal atrial fibrillation: Secondary | ICD-10-CM | POA: Diagnosis not present

## 2022-04-08 DIAGNOSIS — I5032 Chronic diastolic (congestive) heart failure: Secondary | ICD-10-CM | POA: Diagnosis not present

## 2022-04-08 DIAGNOSIS — I2510 Atherosclerotic heart disease of native coronary artery without angina pectoris: Secondary | ICD-10-CM | POA: Diagnosis not present

## 2022-04-08 DIAGNOSIS — Z9181 History of falling: Secondary | ICD-10-CM | POA: Diagnosis not present

## 2022-04-08 DIAGNOSIS — H4010X0 Unspecified open-angle glaucoma, stage unspecified: Secondary | ICD-10-CM | POA: Diagnosis not present

## 2022-04-08 DIAGNOSIS — Z7901 Long term (current) use of anticoagulants: Secondary | ICD-10-CM | POA: Diagnosis not present

## 2022-04-08 DIAGNOSIS — N401 Enlarged prostate with lower urinary tract symptoms: Secondary | ICD-10-CM | POA: Diagnosis not present

## 2022-04-08 DIAGNOSIS — I495 Sick sinus syndrome: Secondary | ICD-10-CM | POA: Diagnosis not present

## 2022-04-08 DIAGNOSIS — I493 Ventricular premature depolarization: Secondary | ICD-10-CM | POA: Diagnosis not present

## 2022-04-13 DIAGNOSIS — I480 Paroxysmal atrial fibrillation: Secondary | ICD-10-CM | POA: Diagnosis not present

## 2022-04-13 DIAGNOSIS — I700 Atherosclerosis of aorta: Secondary | ICD-10-CM | POA: Diagnosis not present

## 2022-04-13 DIAGNOSIS — I110 Hypertensive heart disease with heart failure: Secondary | ICD-10-CM | POA: Diagnosis not present

## 2022-04-13 DIAGNOSIS — I2510 Atherosclerotic heart disease of native coronary artery without angina pectoris: Secondary | ICD-10-CM | POA: Diagnosis not present

## 2022-04-13 DIAGNOSIS — H4010X0 Unspecified open-angle glaucoma, stage unspecified: Secondary | ICD-10-CM | POA: Diagnosis not present

## 2022-04-13 DIAGNOSIS — I5032 Chronic diastolic (congestive) heart failure: Secondary | ICD-10-CM | POA: Diagnosis not present

## 2022-05-14 ENCOUNTER — Ambulatory Visit (INDEPENDENT_AMBULATORY_CARE_PROVIDER_SITE_OTHER): Payer: Medicare Other | Admitting: *Deleted

## 2022-05-14 DIAGNOSIS — I480 Paroxysmal atrial fibrillation: Secondary | ICD-10-CM | POA: Diagnosis not present

## 2022-05-14 DIAGNOSIS — Z5181 Encounter for therapeutic drug level monitoring: Secondary | ICD-10-CM | POA: Diagnosis not present

## 2022-05-14 LAB — POCT INR: INR: 2.4 (ref 2.0–3.0)

## 2022-05-14 NOTE — Patient Instructions (Signed)
Description   Continue taking Warfarin 1 tablet daily except for 1.5 tablets on Mondays and Fridays.  Recheck INR in 6 weeks. Call with any questions or concerns [phone] or [phone]

## 2022-05-19 ENCOUNTER — Telehealth: Payer: Self-pay | Admitting: Pharmacist

## 2022-05-19 NOTE — Chronic Care Management (AMB) (Signed)
Chronic Care Management Pharmacy Assistant   Name: [name] [name]  MRN: [id number] DOB: 01-05-1929  Reason for Encounter: Disease State   Conditions to be addressed/monitored: HTN  Recent office visits:  02/22/22 [name], MD - Patient presented for Dysequilibrium and other concerns. No medication changes.  Recent consult visits:  05/14/22 Hemphill, Myrtis Hopping, RN - Patient presented for Anti Coag Visit. Reading was 2.4.  04/02/22 Hemphill, Candance B, RN - Patient presented for Anti Coag Visit. Reading was 2.6.   03/31/22 Hyman Hopes, MD [facility]) - Patient presented for IOP check. No medication changes.  03/12/22 Patient presented to [facility] for Carotid.  02/19/22 Lutricia Feil, RN - Patient presented for Anti Coag Visit. Reading was 2.7.   02/03/22 John Giovanni  ([place]) - Patient presented for Permanent atrial fibrillation. No medication changes.  02/02/22 Aldean Jewett ([place]) - Patient presented for Permanent atrial fibrillation. No medication changes.  01/19/22 Glorious Peach (Pathology) - Claims encounter for Squamous cell carcinoma of skin of left lower limb including hip. No other visit details available.  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 No other visit details available.  01/15/22 Brynda Peon, RN - Patient presented for Anti Coag visit reading was 2.8.  01/15/22 Nahser, Wonda Cheng (Cardiology) - Claims encounter for Paroxysmal atrial fibrillation and other concerns. No other visit details available.  Hospital visits:  Medication Reconciliation was completed by comparing discharge summary, patient's EMR and Pharmacy list, and upon discussion with patient.  Patient presented to [facility] ED on 03/07/22 due to Ambulatory Dysfunction. Patient was present for 5 hours.  New?Medications Started at [facility]  Discharge:?? -started  none  Medication Changes at Hospital Discharge: -Changed  none  Medications Discontinued at Hospital Discharge: -Stopped  none  Medications that remain the same after Hospital Discharge:??  -All other medications will remain the same.    Medications: Outpatient Encounter Medications as of 05/19/2022  Medication Sig   atorvastatin (LIPITOR) 80 MG tablet Take 1 tablet (80 mg total) by mouth daily.   brimonidine-timolol (COMBIGAN) 0.2-0.5 % ophthalmic solution SMARTSIG:1 Drop(s) In Eye(s) Morning-Night   CLARITIN 10 MG CAPS Take 1 capsule by mouth at bedtime.   [employer] ACETAMINOPHEN 325 MG CAPS Take 1 capsule by mouth 2 (two) times daily.    diltiazem (CARDIZEM) 30 MG tablet Take 1 tablet every 4 hours AS NEEDED for afib heart rate over 100   finasteride (PROSCAR) 5 MG tablet Take 5 mg daily by mouth.   furosemide (LASIX) 40 MG tablet Take 1 tablet (40 mg total) by mouth daily as needed for fluid (increased shortness of breath, leg edema, weight gain > 5 pounds in 1 week).   latanoprost (XALATAN) 0.005 % ophthalmic solution Place 1 drop into both eyes at bedtime.    lisinopril (PRINIVIL,ZESTRIL) 10 MG tablet Take 10 mg by mouth at bedtime.    melatonin 5 MG TABS Take 5 mg by mouth.   metoprolol tartrate (LOPRESSOR) 12.5 mg TABS tablet Take 12.5 mg by mouth 2 (two) times daily.   mometasone (NASONEX) 50 MCG/ACT nasal spray Place 2 sprays into both nostrils daily as needed (allergies).   Multiple Vitamins-Minerals (CENTRUM SILVER PO) Take 1 tablet by mouth every morning.   pantoprazole (PROTONIX) 40 MG tablet Take 1 tablet (40 mg total) by mouth daily.   potassium chloride (KLOR-CON)  10 MEQ CR tablet Take 10 mEq by mouth every morning.   tamsulosin (FLOMAX) 0.4 MG CAPS Take 0.4 mg by mouth 2 (two) times daily.    TOLAK 4 % CREA Apply 1 application topically daily as needed (rough place on scalp).   warfarin (COUMADIN) 5 MG tablet TAKE 1-2 TABLETS BY MOUTH DAILY AS  DIRECTED BY THE COUMDADIN CLINIC   No facility-administered encounter medications on file as of 05/19/2022.   Reviewed chart prior to disease state call. Spoke with patient regarding BP  Recent Office Vitals: BP Readings from Last 3 Encounters:  03/07/22 131/66  02/22/22 128/64  11/16/21 126/60   Pulse Readings from Last 3 Encounters:  03/07/22 79  02/22/22 (!) 52  11/16/21 (!) 56    Wt Readings from Last 3 Encounters:  02/22/22 200 lb (90.7 kg)  11/26/21 195 lb (88.5 kg)  11/16/21 195 lb (88.5 kg)     Kidney Function Lab Results  Component Value Date/Time   CREATININE 1.00 03/07/2022 09:13 AM   CREATININE 0.98 03/07/2022 09:00 AM   CREATININE 1.07 09/15/2020 09:34 AM   CREATININE 1.02 03/15/2016 11:18 AM   GFR 65.61 09/16/2021 08:35 AM   GFRNONAA >60 03/07/2022 09:00 AM   GFRNONAA 61 09/15/2020 09:34 AM   GFRAA 71 09/15/2020 09:34 AM       Latest Ref Rng & Units 03/07/2022    9:13 AM 03/07/2022    9:00 AM 09/16/2021    8:35 AM  BMP  Glucose 70 - 99 mg/dL 133  130  104   BUN 8 - 23 mg/dL '22  20  23   '$ Creatinine 0.61 - 1.24 mg/dL 1.00  0.98  1.01   Sodium 135 - 145 mmol/L 140  140  138   Potassium 3.5 - 5.1 mmol/L 3.8  3.8  4.2   Chloride 98 - 111 mmol/L 100  106  99   CO2 22 - 32 mmol/L  28  31   Calcium 8.9 - 10.3 mg/dL  8.9  9.2     Current antihypertensive regimen:  Lisinopril 20 mg 1/2 tablet daily Metoprolol tartrate 12.5 mg 1 tablet twice daily How often are you checking your Blood Pressure? 3-5x per week Current home BP readings: 130/60 128/62 Patient denies any hyper/hypotensive symptoms What recent interventions/DTPs have been made by any provider to improve Blood Pressure control since last CPP Visit: Patient reports none Any recent hospitalizations or ED visits since last visit with CPP? No BP Readings from Last 3 Encounters:  03/07/22 131/66  02/22/22 128/64  11/16/21 126/60  Patient reports he has been doing well no concerns or issues at this  time. He expresses compliance in the use of his hypertension medications.  Adherence Review: Is the patient currently on ACE/ARB medication? Yes Does the patient have >5 day gap between last estimated fill dates? No    Care Gaps: Zoster Vaccine - Overdue COVID Booster - Overdue CCM- 10/23 AWV- 1/23 BP- 130/60 05/20/22  Star Rating Drugs: Atorvastatin 80 mg - Last filled at [facility] Lisinopril 10 mg - Last filled at [place] Pharmacist Assistant [phone]

## 2022-05-28 DIAGNOSIS — N200 Calculus of kidney: Secondary | ICD-10-CM | POA: Diagnosis not present

## 2022-05-28 DIAGNOSIS — R3912 Poor urinary stream: Secondary | ICD-10-CM | POA: Diagnosis not present

## 2022-05-28 DIAGNOSIS — N401 Enlarged prostate with lower urinary tract symptoms: Secondary | ICD-10-CM | POA: Diagnosis not present

## 2022-06-17 ENCOUNTER — Other Ambulatory Visit: Payer: Self-pay | Admitting: Home Health

## 2022-06-17 ENCOUNTER — Other Ambulatory Visit: Payer: Self-pay | Admitting: Cardiology

## 2022-06-17 NOTE — Telephone Encounter (Signed)
Refill request for warfarin:  Last INR was 2.4 on 05/14/22 Next INR due on 06/25/22 LOV was 01/15/22  P Nahser MD  Refill approved.

## 2022-06-25 ENCOUNTER — Ambulatory Visit (INDEPENDENT_AMBULATORY_CARE_PROVIDER_SITE_OTHER): Payer: Medicare Other | Admitting: *Deleted

## 2022-06-25 DIAGNOSIS — I480 Paroxysmal atrial fibrillation: Secondary | ICD-10-CM | POA: Diagnosis not present

## 2022-06-25 DIAGNOSIS — Z5181 Encounter for therapeutic drug level monitoring: Secondary | ICD-10-CM | POA: Diagnosis not present

## 2022-06-25 LAB — POCT INR: INR: 3.9 — AB (ref 2.0–3.0)

## 2022-06-25 NOTE — Patient Instructions (Signed)
Description   Hold warfarin today, then continue taking Warfarin 1 tablet daily except for 1.5 tablets on Mondays and Fridays.  Recheck INR in 3 weeks. Call with any questions or concerns [phone] or [phone]

## 2022-07-01 DIAGNOSIS — H401132 Primary open-angle glaucoma, bilateral, moderate stage: Secondary | ICD-10-CM | POA: Diagnosis not present

## 2022-07-12 NOTE — Progress Notes (Unsigned)
Cardiology Office Note   Date:  07/14/2022   ID:  [name] [name], DOB 06-01-1929, MRN [id number]   PCP:  [name], MD  Cardiologist:   [name], MD    Chief Complaint  Patient presents with   Atrial Fibrillation      History of Present Illness: [name] [name] is a 88 y.o. male who who presents for follow up of PAF.  He was in the ED on late December 2018 with PAF. He saw [name] and considered ablation vs amiodarone.  He decided to pursue medical management.  He was in the hospital with PAF in August 2022.  This episode lasted longer than usual.  He did convert subsequently spontaneously.   He returns for follow up.  Since I last saw him he has had a couple of bouts of atrial fibrillation 1 lasting about 13-1/2 hours and the other 7-1/2 hours.  He can feel this but it does not particularly bother him.  It does not go particularly fast.  He has chronic lower extremity swelling and rarely takes Lasix for this.  He gets around with his cane but he is careful not to fall.  The patient denies any new symptoms such as chest discomfort, neck or arm discomfort. There has been no new shortness of breath, PND or orthopnea. There have been no reported palpitations, presyncope or syncope.   Past Medical History:  Diagnosis Date   Coronary artery disease    a. LHC on 04/06/16 which revealed 40% occl pRCA, 85% occl D1, 75% occl Ramus, 65% occl mLCx, 45% occl pLAD, 90% occl pLAD and nl LV function. s/p PCI/DES to prox LAD. Medical therapy for diag disease.     GERD (gastroesophageal reflux disease)    Glaucoma    sees Dr. [name]   Hemorrhoids    internal   HTN (hypertension)    Hx of colonic polyps    tubular adenoma    Hyperlipidemia    Kidney stone    Melanoma of cheek ([place])    Nephrolithiasis    Pancolonic diverticulosis    Paroxysmal atrial fibrillation ([place])    a. s/p ablation 12/2014: on coumadin    Past Surgical History:  Procedure Laterality Date   ATRIAL  FIBRILLATION ABLATION N/A 11/12/2014   Procedure: ATRIAL FIBRILLATION ABLATION;  Surgeon: [name], MD;  Location: [facility] CATH LAB;  Service: Cardiovascular;  Laterality: N/A;   BLADDER SURGERY     ruptured blood vessel in the bladder 3 weeks S/P ureter stones removed   CARDIAC CATHETERIZATION N/A 04/06/2016   Procedure: Left Heart Cath and Coronary Angiography;  Surgeon: [name], MD;  Location: [place] CV LAB;  Service: Cardiovascular;  Laterality: N/A;   CARDIAC CATHETERIZATION N/A 04/06/2016   Procedure: Coronary Stent Intervention;  Surgeon: [name], MD;  Location: [place] CV LAB;  Service: Cardiovascular;  Laterality: N/A;   CATARACT EXTRACTION W/ INTRAOCULAR LENS  IMPLANT, BILATERAL Bilateral    COLONOSCOPY  06-29-12   per Dr. [name], adenomatous polyps, repeat in 3 yrs    CORONARY ANGIOPLASTY WITH STENT PLACEMENT  04/06/2016   "1 stent"   CYSTOSCOPY/RETROGRADE/URETEROSCOPY/STONE EXTRACTION WITH BASKET Left    removed 2 stones from left ureter   INGUINAL HERNIA REPAIR Right 04/1990   Dr. [name]   LAPAROSCOPIC CHOLECYSTECTOMY  [phone]   Dr. [name]   MELANOMA EXCISION Right    "@ cheek near ear"   TEE WITHOUT CARDIOVERSION N/A 11/11/2014  Procedure: TRANSESOPHAGEAL ECHOCARDIOGRAM (TEE);  Surgeon: [name], MD;  Location: [facility] ENDOSCOPY;  Service: Cardiovascular;  Laterality: N/A;  needs INR before case   VASECTOMY       Current Outpatient Medications  Medication Sig Dispense Refill   atorvastatin (LIPITOR) 80 MG tablet Take 1 tablet (80 mg total) by mouth daily. 90 tablet 3   CLARITIN 10 MG CAPS Take 1 capsule by mouth at bedtime.     [employer]S ACETAMINOPHEN 325 MG CAPS Take 1 capsule by mouth 2 (two) times daily.      diltiazem (CARDIZEM) 30 MG tablet Take 1 tablet every 4 hours AS NEEDED for afib heart rate over 100 45 tablet 1   finasteride (PROSCAR) 5 MG tablet Take 5 mg daily by mouth.     furosemide (LASIX) 40 MG tablet TAKE 1 TABLET BY MOUTH DAILY AS  NEEDED FOR FLUID (INCREASED SHORTNESS OF BREATH, LEG EDEMA, WEIGHT GAIN > 5 POUNDS IN 1 WEEK) 30 tablet 6   latanoprost (XALATAN) 0.005 % ophthalmic solution Place 1 drop into both eyes at bedtime.      lisinopril (PRINIVIL,ZESTRIL) 10 MG tablet Take 10 mg by mouth at bedtime.      melatonin 5 MG TABS Take 5 mg by mouth.     metoprolol tartrate (LOPRESSOR) 12.5 mg TABS tablet Take 12.5 mg by mouth 2 (two) times daily.     mometasone (NASONEX) 50 MCG/ACT nasal spray Place 2 sprays into both nostrils daily as needed (allergies).     Multiple Vitamins-Minerals (CENTRUM SILVER PO) Take 1 tablet by mouth every morning.     pantoprazole (PROTONIX) 40 MG tablet Take 1 tablet (40 mg total) by mouth daily. 90 tablet 3   potassium chloride (KLOR-CON) 10 MEQ CR tablet Take 10 mEq by mouth every morning.     tamsulosin (FLOMAX) 0.4 MG CAPS Take 0.4 mg by mouth 2 (two) times daily.      TOLAK 4 % CREA Apply 1 application topically daily as needed (rough place on scalp).     warfarin (COUMADIN) 5 MG tablet TAKE 1-2 TABLETS BY MOUTH DAILY AS DIRECTED BY THE COUMDADIN CLINIC 135 tablet 1   No current facility-administered medications for this visit.    Allergies:   Sulfa antibiotics and Sulfamethoxazole    ROS:  Please see the history of present illness.   Otherwise, review of systems are positive for none.   All other systems are reviewed and negative.    PHYSICAL EXAM: VS:  BP 130/62   Pulse (!) 59   Ht '5\' 11"'$  (1.803 m)   Wt 190 lb (86.2 kg)   SpO2 94%   BMI 26.50 kg/m  , BMI Body mass index is 26.5 kg/m. GENERAL:  Well appearing NECK:  No jugular venous distention, waveform within normal limits, carotid upstroke brisk and symmetric, no bruits, no thyromegaly LUNGS:  Clear to auscultation bilaterally CHEST:  Unremarkable HEART:  PMI not displaced or sustained,S1 and S2 within normal limits, no S3, no clicks, no rubs, no murmurs, irregular  ABD:  Flat, positive bowel sounds normal in frequency  in pitch, no bruits, no rebound, no guarding, no midline pulsatile mass, no hepatomegaly, no splenomegaly EXT:  2 plus pulses throughout, moderate bilateral ankle edema, no cyanosis no clubbing   EKG:  EKG is not ordered today.    Recent Labs: 09/16/2021: TSH 2.06 03/07/2022: ALT 28; BUN 22; Creatinine, Ser 1.00; Hemoglobin 12.6; Platelets 162; Potassium 3.8; Sodium 140    Lipid Panel  Component Value Date/Time   CHOL 130 09/16/2021 0835   TRIG 117.0 09/16/2021 0835   HDL 44.90 09/16/2021 0835   CHOLHDL 3 09/16/2021 0835   VLDL 23.4 09/16/2021 0835   LDLCALC 62 09/16/2021 0835   LDLCALC 74 09/15/2020 0934      Wt Readings from Last 3 Encounters:  07/14/22 190 lb (86.2 kg)  02/22/22 200 lb (90.7 kg)  11/26/21 195 lb (88.5 kg)      Other studies Reviewed: Additional studies/ records that were reviewed today include: Labs, . Review of the above records demonstrates:  Please see elsewhere in the note.     ASSESSMENT AND PLAN:  PAF:     [name] [name] has a CHA2DS2 - VASc score of 4.    He tolerates paroxysms of this.  No change in therapy.  He does not wish to switch to a DOAC.   CAD:    The patient has no new sypmtoms.  No further cardiovascular testing is indicated.  We will continue with aggressive risk reduction and meds as listed.  HTN:  BP is controlled.  No change in therapy.  I reviewed her blood pressure diary  CHRONIC DISATOLIC HF:   We again talked about as needed dosing of his diuretic.  He watches salt.  No change in therapy.  Current medicines are reviewed at length with the patient today.  The patient does not have concerns regarding medicines.  The following changes have been made: None  Labs/ tests ordered today include: None  No orders of the defined types were placed in this encounter.   Disposition:   FU with me in 6 months   Signed, [name], MD  07/14/2022 10:04 AM    [place]

## 2022-07-14 ENCOUNTER — Ambulatory Visit: Payer: Medicare Other | Attending: Cardiology | Admitting: Cardiology

## 2022-07-14 ENCOUNTER — Encounter: Payer: Self-pay | Admitting: Cardiology

## 2022-07-14 ENCOUNTER — Ambulatory Visit (INDEPENDENT_AMBULATORY_CARE_PROVIDER_SITE_OTHER): Payer: Medicare Other | Admitting: *Deleted

## 2022-07-14 VITALS — BP 130/62 | HR 59 | Ht 71.0 in | Wt 190.0 lb

## 2022-07-14 DIAGNOSIS — Z5181 Encounter for therapeutic drug level monitoring: Secondary | ICD-10-CM | POA: Diagnosis not present

## 2022-07-14 DIAGNOSIS — I5032 Chronic diastolic (congestive) heart failure: Secondary | ICD-10-CM | POA: Diagnosis not present

## 2022-07-14 DIAGNOSIS — I480 Paroxysmal atrial fibrillation: Secondary | ICD-10-CM | POA: Insufficient documentation

## 2022-07-14 DIAGNOSIS — I2510 Atherosclerotic heart disease of native coronary artery without angina pectoris: Secondary | ICD-10-CM | POA: Diagnosis not present

## 2022-07-14 DIAGNOSIS — I10 Essential (primary) hypertension: Secondary | ICD-10-CM | POA: Diagnosis not present

## 2022-07-14 LAB — POCT INR: INR: 3.9 — AB (ref 2.0–3.0)

## 2022-07-14 NOTE — Patient Instructions (Signed)
Description   Hold warfarin today, then start taking Warfarin 1 tablet daily except for 1.5 tablets on Fridays.  Recheck INR in 3 weeks. Call with any questions or concerns [phone] or [phone]

## 2022-07-14 NOTE — Patient Instructions (Signed)
Medication Instructions:  Continue same medications *If you need a refill on your cardiac medications before your next appointment, please call your pharmacy*   Lab Work: None ordered   Testing/Procedures: None ordered   Follow-Up: At [facility], you and your health needs are our priority.  As part of our continuing mission to provide you with exceptional heart care, we have created designated Provider Care Teams.  These Care Teams include your primary Cardiologist (physician) and Advanced Practice Providers (APPs -  Physician Assistants and Nurse Practitioners) who all work together to provide you with the care you need, when you need it.  We recommend signing up for the patient portal called "MyChart".  Sign up information is provided on this After Visit Summary.  MyChart is used to connect with patients for Virtual Visits (Telemedicine).  Patients are able to view lab/test results, encounter notes, upcoming appointments, etc.  Non-urgent messages can be sent to your provider as well.   To learn more about what you can do with MyChart, go to [email or web address].    Your next appointment:  6 months   Call in Dec to schedule March appointment     The format for your next appointment: Office    Provider:  Dr.Hochrein   Important Information About Sugar

## 2022-07-19 DIAGNOSIS — L814 Other melanin hyperpigmentation: Secondary | ICD-10-CM | POA: Diagnosis not present

## 2022-07-19 DIAGNOSIS — L570 Actinic keratosis: Secondary | ICD-10-CM | POA: Diagnosis not present

## 2022-07-19 DIAGNOSIS — Z85828 Personal history of other malignant neoplasm of skin: Secondary | ICD-10-CM | POA: Diagnosis not present

## 2022-07-19 DIAGNOSIS — L821 Other seborrheic keratosis: Secondary | ICD-10-CM | POA: Diagnosis not present

## 2022-07-19 DIAGNOSIS — I872 Venous insufficiency (chronic) (peripheral): Secondary | ICD-10-CM | POA: Diagnosis not present

## 2022-07-19 DIAGNOSIS — D225 Melanocytic nevi of trunk: Secondary | ICD-10-CM | POA: Diagnosis not present

## 2022-07-19 DIAGNOSIS — Z08 Encounter for follow-up examination after completed treatment for malignant neoplasm: Secondary | ICD-10-CM | POA: Diagnosis not present

## 2022-08-03 DIAGNOSIS — Z23 Encounter for immunization: Secondary | ICD-10-CM | POA: Diagnosis not present

## 2022-08-06 ENCOUNTER — Ambulatory Visit: Payer: Medicare Other | Attending: Cardiology

## 2022-08-06 DIAGNOSIS — I480 Paroxysmal atrial fibrillation: Secondary | ICD-10-CM

## 2022-08-06 DIAGNOSIS — Z5181 Encounter for therapeutic drug level monitoring: Secondary | ICD-10-CM | POA: Diagnosis not present

## 2022-08-06 LAB — POCT INR: INR: 2.7 (ref 2.0–3.0)

## 2022-08-06 NOTE — Patient Instructions (Signed)
Continue 1 tablet daily except for 1.5 tablets on Fridays.  Recheck INR in 4 weeks. Call with any questions or concerns [phone] or [phone]

## 2022-08-17 ENCOUNTER — Telehealth: Payer: Self-pay | Admitting: Pharmacist

## 2022-08-17 NOTE — Progress Notes (Unsigned)
Chronic Care Management Pharmacy Note  08/18/2022 Name:  [name] [name] MRN:  [id number] DOB:  10/01/1929  Summary: BP at goal <140/90 per home readings and pt is no longer having any lows  Recommendations/Changes made from today's visit: -Recommended bringing BP cuff to next office visit to ensure accuracy -Recommended limiting caffeine use with Afib  Plan: BP assessment in 6 months Follow up in 1 year  Subjective: [name] [name] is an 88 y.o. year old male who is a primary patient of [name], MD.  The CCM team was consulted for assistance with disease management and care coordination needs.    Engaged with patient by telephone for follow up visit in response to provider referral for pharmacy case management and/or care coordination services.   Consent to Services:  The patient was given information about Chronic Care Management services, agreed to services, and gave verbal consent prior to initiation of services.  Please see initial visit note for detailed documentation.   Patient Care Team: [name], MD as PCP - Cheral Bay, MD as PCP - Cardiology (Cardiology) Viona Gilmore, [facility] as Pharmacist (Pharmacist)  Recent office visits: 02/22/22 [name], MD - Patient presented for Dysequilibrium and other concerns. No medication changes.  Recent consult visits: 08/06/22 Frederik Schmidt, RN (cardiology): Patient presented for anti-coag visit. INR 2.7, goal 2-3. Continued 7.5 mg (5 mg x 1.5) every Fri; 5 mg (5 mg x 1) all other days. Recheck in 4 weeks.  07/14/22 [name], MD (cardiology): Patient presented for Afib follow up.  Follow up in 6 months.  07/01/22 [name], MD (eye care): Patient presented for IOP check. Plan to continue with latanoprost in both eyes every night.  03/31/22 Hyman Hopes, MD [facility]) - Patient presented for IOP check. No medication changes.   03/12/22 Patient presented to [facility] for  Carotid.  Hospital visits: Medication Reconciliation was completed by comparing discharge summary, patient's EMR and Pharmacy list, and upon discussion with patient.   Patient presented to [facility] ED on 03/07/22 due to Ambulatory Dysfunction. Patient was present for 5 hours.   New?Medications Started at [facility] Discharge:?? -started  none   Medication Changes at Hospital Discharge: -Changed  none   Medications Discontinued at Hospital Discharge: -Stopped  none   Medications that remain the same after Hospital Discharge:??  -All other medications will remain the same.     Objective:  Lab Results  Component Value Date   CREATININE 1.00 03/07/2022   BUN 22 03/07/2022   GFR 65.61 09/16/2021   GFRNONAA >60 03/07/2022   GFRAA 71 09/15/2020   NA 140 03/07/2022   K 3.8 03/07/2022   CALCIUM 8.9 03/07/2022   CO2 28 03/07/2022   GLUCOSE 133 (H) 03/07/2022    Lab Results  Component Value Date/Time   HGBA1C 6.2 09/16/2021 08:35 AM   HGBA1C 6.2 (H) 09/15/2020 09:34 AM   GFR 65.61 09/16/2021 08:35 AM   GFR 60.11 02/24/2021 04:25 PM    Last diabetic Eye exam: No results found for: "HMDIABEYEEXA"  Last diabetic Foot exam: No results found for: "HMDIABFOOTEX"   Lab Results  Component Value Date   CHOL 130 09/16/2021   HDL 44.90 09/16/2021   LDLCALC 62 09/16/2021   TRIG 117.0 09/16/2021   CHOLHDL 3 09/16/2021       Latest Ref Rng & Units 03/07/2022    9:00 AM 09/16/2021    8:35 AM 09/15/2020  9:34 AM  Hepatic Function  Total Protein 6.5 - 8.1 g/dL 5.7  6.6  6.9   Albumin 3.5 - 5.0 g/dL 3.7  4.5    AST 15 - 41 U/L 25  30  41   ALT 0 - 44 U/L 28  36  34   Alk Phosphatase 38 - 126 U/L 61  67    Total Bilirubin 0.3 - 1.2 mg/dL 0.7  1.1  0.8   Bilirubin, Direct 0.0 - 0.3 mg/dL  0.2  0.2     Lab Results  Component Value Date/Time   TSH 2.06 09/16/2021 08:35 AM   TSH 2.254 06/10/2021 01:44 AM   TSH 1.93 09/15/2020 09:34 AM       Latest Ref  Rng & Units 03/07/2022    9:13 AM 03/07/2022    9:00 AM 09/16/2021    8:35 AM  CBC  WBC 4.0 - 10.5 K/uL  10.1  9.2   Hemoglobin 13.0 - 17.0 g/dL 12.6  12.3  13.3   Hematocrit 39.0 - 52.0 % 37.0  37.1  39.5   Platelets 150 - 400 K/uL  162  190.0     Lab Results  Component Value Date/Time   VD25OH 37 03/13/2014 10:26 AM    Clinical ASCVD: Yes  The ASCVD Risk score (Arnett DK, et al., 2019) failed to calculate for the following reasons:   The 2019 ASCVD risk score is only valid for ages 43 to 40       02/22/2022   11:07 AM 11/26/2021    2:37 PM 09/16/2021   11:59 AM  Depression screen PHQ 2/9  Decreased Interest 0 0 1  Down, Depressed, Hopeless 0 0 0  PHQ - 2 Score 0 0 1  Altered sleeping 1  1  Tired, decreased energy 1  0  Change in appetite 0  0  Feeling bad or failure about yourself  0  0  Trouble concentrating 0  0  Moving slowly or fidgety/restless 0  0  Suicidal thoughts 0  0  PHQ-9 Score 2  2  Difficult doing work/chores Not difficult at all  Not difficult at all     CHA2DS2/VAS Stroke Risk Points  Current as of 2 hours ago     4 >= 2 Points: High Risk  1 - 1.99 Points: Medium Risk  0 Points: Low Risk    Last Change: N/A      Details    This score determines the patient's risk of having a stroke if the  patient has atrial fibrillation.       Points Metrics  0 Has Congestive Heart Failure:  No    Current as of 2 hours ago  1 Has Vascular Disease:  Yes    Current as of 2 hours ago  1 Has Hypertension:  Yes    Current as of 2 hours ago  2 Age:  66    Current as of 2 hours ago  0 Has Diabetes:  No    Current as of 2 hours ago  0 Had Stroke:  No  Had TIA:  No  Had Thromboembolism:  No    Current as of 2 hours ago  0 Male:  No    Current as of 2 hours ago      Social History   Tobacco Use  Smoking Status Never  Smokeless Tobacco Never   BP Readings from Last 3 Encounters:  07/14/22 130/62  03/07/22 131/66  02/22/22 128/64  Pulse Readings from  Last 3 Encounters:  07/14/22 (!) 59  03/07/22 79  02/22/22 (!) 52   Wt Readings from Last 3 Encounters:  07/14/22 190 lb (86.2 kg)  02/22/22 200 lb (90.7 kg)  11/26/21 195 lb (88.5 kg)   BMI Readings from Last 3 Encounters:  07/14/22 26.50 kg/m  02/22/22 27.89 kg/m  11/26/21 27.20 kg/m    Assessment/Interventions: Review of patient past medical history, allergies, medications, health status, including review of consultants reports, laboratory and other test data, was performed as part of comprehensive evaluation and provision of chronic care management services.   SDOH:  (Social Determinants of Health) assessments and interventions performed: Yes  SDOH Interventions    Flowsheet Row Chronic Care Management from 08/18/2022 in [facility] [place] Clinical Support from 11/25/2020 in [facility] [place] Chronic Care Management from 10/14/2020 in [facility] [place]  SDOH Interventions     Food Insecurity Interventions -- Intervention Not Indicated --  Transportation Interventions Intervention Not Indicated Intervention Not Indicated Intervention Not Indicated  Depression Interventions/Treatment  -- PHQ2-9 Score <4 Follow-up Not Indicated --  Financial Strain Interventions -- Intervention Not Indicated Intervention Not Indicated  Physical Activity Interventions -- Intervention Not Indicated --  Stress Interventions -- Intervention Not Indicated --  Social Connections Interventions -- Intervention Not Indicated --      SDOH Screenings   Food Insecurity: No Food Insecurity (02/21/2022)  Housing: Low Risk  (02/21/2022)  Transportation Needs: No Transportation Needs (08/18/2022)  Alcohol Screen: Low Risk  (11/26/2021)  Depression (PHQ2-9): Low Risk  (02/22/2022)  Financial Resource Strain: High Risk (11/26/2021)  Physical Activity: Inactive (02/21/2022)  Social Connections: Moderately Integrated (02/21/2022)  Stress: Unknown (02/21/2022)  Tobacco  Use: Low Risk  (07/14/2022)    CCM Care Plan  Allergies  Allergen Reactions   Sulfa Antibiotics Other (See Comments)    Severe peeling of the skin on the groin area   Sulfamethoxazole Other (See Comments)    REACTION: rash    Medications Reviewed Today     Reviewed by Verner Chol, [facility] (Pharmacist) on 08/18/22 at 0905  Med List Status: <None>   Medication Order Taking? Sig Documenting Provider Last Dose Status Informant  atorvastatin (LIPITOR) 80 MG tablet [id number] No Take 1 tablet (80 mg total) by mouth daily. Janetta Hora, PA-C Taking Active Self  CLARITIN 10 MG CAPS [id number] No Take 1 capsule by mouth at bedtime. [provider] Taking Active Self  [facility] ACETAMINOPHEN 325 MG CAPS [id number] No Take 1 capsule by mouth 2 (two) times daily.  [provider] Taking Active Self  diltiazem (CARDIZEM) 30 MG tablet [id number] No Take 1 tablet every 4 hours AS NEEDED for afib heart rate over 100 Newman Nip, NP Taking Active   finasteride (PROSCAR) 5 MG tablet [id number] No Take 5 mg daily by mouth. [provider] Taking Active Self  furosemide (LASIX) 40 MG tablet [id number] No TAKE 1 TABLET BY MOUTH DAILY AS NEEDED FOR FLUID (INCREASED SHORTNESS OF BREATH, LEG EDEMA, WEIGHT GAIN > 5 POUNDS IN 1 WEEK) Nahser, Deloris Ping, MD Taking Active   latanoprost (XALATAN) 0.005 % ophthalmic solution [id number] No Place 1 drop into both eyes at bedtime.  [provider] Taking Active Self  lisinopril (PRINIVIL,ZESTRIL) 10 MG tablet [id number] No Take 10 mg by mouth at bedtime.  [provider] Taking Active Self  melatonin 5 MG TABS [id number] No Take 5 mg by mouth. [provider] Taking Active  metoprolol tartrate (LOPRESSOR) 12.5 mg TABS tablet [id number] No Take 12.5 mg by mouth 2 (two) times daily. [provider] Taking Active Self  mometasone (NASONEX) 50 MCG/ACT nasal spray [id number] No Place 2 sprays into both nostrils daily as  needed (allergies). [provider] Taking Active Self  Multiple Vitamins-Minerals (CENTRUM SILVER PO) [id number] No Take 1 tablet by mouth every morning. [provider] Taking Active Self  pantoprazole (PROTONIX) 40 MG tablet [id number] No Take 1 tablet (40 mg total) by mouth daily. Eileen Stanford, PA-C Taking Active Self  potassium chloride (KLOR-CON) 10 MEQ CR tablet [id number] No Take 10 mEq by mouth every morning. [provider] Taking Active Self  tamsulosin (FLOMAX) 0.4 MG CAPS [id number] No Take 0.4 mg by mouth 2 (two) times daily.  [provider] Taking Active Self  TOLAK 4 % CREA [id number] No Apply 1 application topically daily as needed (rough place on scalp). [provider] Taking Active Self  warfarin (COUMADIN) 5 MG tablet [id number] No TAKE 1-2 TABLETS BY MOUTH DAILY AS DIRECTED BY THE [facility] Nahser, Wonda Cheng, MD Taking Active             Patient Active Problem List   Diagnosis Date Noted   Atherosclerosis of aorta ([place]) 09/16/2021   Open-angle glaucoma 09/16/2021   Chronic diastolic HF (heart failure) ([place]) 06/30/2021   Atrial fibrillation ([place]) 06/09/2021   Constipation 02/06/2021   Coronary artery disease involving native coronary artery of native heart without angina pectoris 10/15/2019   Educated about COVID-19 virus infection 10/15/2019   Near syncope 06/10/2017   Chest pain with moderate risk for cardiac etiology 06/10/2017   CAD S/P percutaneous coronary angioplasty    Abnormal stress test    PAF (paroxysmal atrial fibrillation) ([place]) 11/12/2014   Encounter for therapeutic drug monitoring 12/03/2013   PVC's (premature ventricular contractions) 01/26/2012   Tachycardia-bradycardia syndrome ([place]) 01/26/2012   Long term (current) use of anticoagulants 09/06/2011   GERD 08/01/2007   History of colonic polyps 08/01/2007   Personal history of urinary calculi 08/01/2007   BPH with urinary obstruction 08/01/2007    Hyperlipidemia 07/04/2007   Essential hypertension 07/04/2007   ALLERGIC RHINITIS 07/04/2007    Immunization History  Administered Date(s) Administered   Fluad Quad(high Dose 65+) 07/27/2019   Influenza Split 08/19/2011, 08/21/2012   Influenza Whole 08/14/2007, 08/01/2008, 08/11/2009, 08/17/2010   Influenza, High Dose Seasonal PF 08/25/2015, 08/25/2016, 07/29/2017, 07/25/2018, 09/02/2020, 08/03/2022   Influenza,inj,Quad PF,6+ Mos 08/22/2013, 08/23/2014   Influenza-Unspecified 08/08/2000, 09/08/2001, 08/12/2002, 08/12/2003, 08/08/2004, 10/05/2005, 08/08/2006, 09/03/2021   Moderna Covid-19 Vaccine Bivalent Booster 47yrs & up 09/15/2021   Moderna Sars-Covid-2 Vaccination 11/20/2019, 12/18/2019, 09/07/2020   Pneumococcal Conjugate-13 03/07/2014   Pneumococcal Polysaccharide-23 11/08/1998, 08/08/2000, 09/08/2005, 08/13/2006   Td 06/08/2001, 07/16/2008   Tdap 08/25/2016   Zoster, Live 08/24/2011   Patient has glaucoma and the eye drops and it was a stronger one that controls his balance. He feels like he is staggering around "like a drunk man". He reports the damage was irreversible. Patient hasn't had any falls and has been using a cane and he does have a rollator.   Patient has a new provider at the [facility] in [place] but has not met her and was scheduled for a telephone call back in April. He went around in circles with her because she didn't want to refill his heart medications.   Patient has not had issues with sleep within the last few weeks.   Conditions to be addressed/monitored:  Hypertension, Hyperlipidemia, Atrial Fibrillation, Coronary Artery Disease, GERD, BPH, Allergic Rhinitis and Glaucoma  Conditions addressed this visit: Hypertension, Afib  Care Plan : CCM Pharmacy Care Plan  Updates made by Viona Gilmore, [place] since 08/18/2022 12:00 AM     Problem: Problem: Hypertension, Hyperlipidemia, Atrial Fibrillation, Coronary Artery Disease, GERD, BPH, Allergic Rhinitis and  Glaucoma      Long-Range Goal: Patient-Specific Goal   Start Date: 04/13/2021  Expected End Date: 04/13/2022  Recent Progress: On track  Priority: High  Note:   Current Barriers:  Unable to independently monitor therapeutic efficacy  Pharmacist Clinical Goal(s):  Patient will achieve adherence to monitoring guidelines and medication adherence to achieve therapeutic efficacy through collaboration with PharmD and provider.   Interventions: 1:1 collaboration with [name], MD regarding development and update of comprehensive plan of care as evidenced by provider attestation and co-signature Inter-disciplinary care team collaboration (see longitudinal plan of care) Comprehensive medication review performed; medication list updated in electronic medical record  Hypertension (BP goal <140/90) -Controlled -Current treatment: Lisinopril 20 mg 1/2 tablet daily - Appropriate, Effective, Safe, Accessible Metoprolol tartrate 12.5 mg 1 tablet twice daily - Appropriate, Effective, Safe, Accessible -Medications previously tried: none  -Current home readings: 133/61, 129/56 HR , 121/68, 129/68, 128/79 (wrist cuff) -Current dietary habits: no changes -Current exercise habits: slightly less active lately -Denies hypotensive/hypertensive symptoms -Educated on BP goals and benefits of medications for prevention of heart attack, stroke and kidney damage; Exercise goal of 150 minutes per week; Importance of home blood pressure monitoring; Proper BP monitoring technique; -Counseled to monitor BP at home weekly, document, and provide log at future appointments -Counseled on diet and exercise extensively Recommended to continue current medication Recommended bringing BP cuff to next office visit to ensure accuracy.  Hyperlipidemia: (LDL goal < 70) -Controlled -Current treatment: Atorvastatin 80 mg 1 tablet daily at bedtime - Appropriate, Effective, Safe, Accessible -Medications previously tried:  none -Current dietary patterns: did not discuss -Current exercise habits: slightly less active lately -Educated on Cholesterol goals;  Benefits of statin for ASCVD risk reduction; Exercise goal of 150 minutes per week; -Counseled on diet and exercise extensively Recommended to continue current medication  Atrial Fibrillation (Goal: prevent stroke and major bleeding) -Controlled -CHADSVASC: 3 -Current treatment: Rate control:  Diltiazem 30 mg 1 tablet every 4 hours as needed for HR > 100 bpm, Metoprolol tartrate 12.5 mg 1 tablet twice daily - Appropriate, Effective, Safe, Accessible Anticoagulation:  Warfarin 5 mg tablet 7.5 mg on Mon and Fri and 5 mg on all other days - Appropriate, Effective, Safe, Accessible -Medications previously tried: none -Home BP and HR readings: mostly 60s,  -Counseled on importance of adherence to anticoagulant exactly as prescribed; avoidance of NSAIDs due to increased bleeding risk with anticoagulants; -Recommended to continue current medication Counseled on triggers of Afib including chocolate and caffeine.  CAD (Goal: prevent heart events) -Controlled -Current treatment  Warfarin 5 mg tablet 7.5 mg as directed by coumadin clinic - Appropriate, Effective, Safe, Accessible Nitroglycerin 0.4 mg SL tablet as needed - Appropriate, Effective, Safe, Accessible Atorvastatin 80 mg 1 tablet daily - Appropriate, Effective, Safe, Accessible -Medications previously tried: none  -Recommended to continue current medication  Lower extremity edema (Goal: minimize swelling) -Controlled -Current treatment  Furosemide 20 mg daily as needed - Appropriate, Effective, Safe, Accessible -Medications previously tried: none  -Recommended to continue current medication  BPH (Goal: minimize symptoms) -Controlled -Current treatment  Tamsulosin 0.4 mg 1 capsule twice daily - Appropriate, Effective,  Safe, Accessible Finasteride 5 mg 1 tablet daily - Appropriate, Effective,  Safe, Accessible -Medications previously tried: none  -Recommended to continue current medication  GERD (Goal: minimize symptoms) -Controlled -Current treatment  Pantoprazole 40 mg 1 tablet daily - Appropriate, Effective, Safe, Accessible -Medications previously tried: none  -Recommended to continue current medication  Allergic rhinitis (Goal: minimize symptoms) -Controlled -Current treatment  Claritin 10 mg 1 tablet daily in the morning Mometasone 50 mcg/act 2 sprays in both nostrils at bedtime -Medications previously tried: Zyrtec (duplication)  -Recommended to continue current medication  Glaucoma (Goal: lower intraocular pressure) -Controlled -Current treatment  Latanoprost 0.005% 1 drop in both eyes at bedtime -Medications previously tried: none  -Recommended to continue current medication   Health Maintenance -Vaccine gaps: Shingrix -Current therapy:  Acetaminophen 500 mg 1 capsule twice daily Centrum silver 1 tablet daily Potassium chloride 10 mEq 1 tablet every morning Tolak 4% cream (prescribed by dermatologist, sees every 6 months - Dr. Pearline Cables) -Educated on Cost vs benefit of each product must be carefully weighed by individual consumer -Patient is satisfied with current therapy and denies issues -Recommended to continue current medication  Patient Goals/Self-Care Activities Patient will:  - take medications as prescribed check blood pressure weekly, document, and provide at future appointments target a minimum of 150 minutes of moderate intensity exercise weekly  Follow Up Plan: Telephone follow up appointment with care management team member scheduled for: 1 year      Medication Assistance: None required.  Patient affirms current coverage meets needs.  Compliance/Adherence/Medication fill history: Care Gaps: Shingrix, COVID booster Last BP - 130/62 on 07/14/2022  Star-Rating Drugs: Atorvastatin 80 mg - last filled at [facility] Lisinopril 10 mg  - last filled  at [facility]  Patient's preferred pharmacy is:  [facility], [place] [address] Phone: [phone] Fax: [phone]  [place], [place] [address] Phone: [phone] Fax: [phone]   Uses pill box? Yes - daily pill box for vacations; otherwise flips cup when he takes (has 1 in the night and the morning) Pt endorses 100% compliance  We discussed: Current pharmacy is preferred with insurance plan and patient is satisfied with pharmacy services Patient decided to: Continue current medication management strategy  Care Plan and Follow Up Patient Decision:  Patient agrees to Care Plan and Follow-up.  Plan: Telephone follow up appointment with care management team member scheduled for:  1 year  Jeni Salles, PharmD [place] Pharmacist [employer] at [place] [phone]

## 2022-08-17 NOTE — Chronic Care Management (AMB) (Signed)
    Chronic Care Management Pharmacy Assistant   Name: CRUISE BAUMGARDNER  MRN: [id number] DOB: 27-Jun-1929  08/18/2022 APPOINTMENT REMINDER  Called [name], No answer, left message of appointment on 08/18/2022 at 9:00 via telephone visit with Jeni Salles, Pharm D. Notified to have all medications, supplements, blood pressure and/or blood sugar logs available during appointment and to return call if need to reschedule.  Care Gaps: AWV - scheduled 11/29/2022 Last BP - 130/62 on 07/14/2022 Shingrix - never done Covid - overdue Flu - due  Star Rating Drug: Atorvastatin 80 mg - last filled at [facility] Lisinopril 10 mg  - last filled at [facility]  Any gaps in medications fill history? No  Gennie Alma [facility]  Catering manager [phone]

## 2022-08-18 ENCOUNTER — Ambulatory Visit (INDEPENDENT_AMBULATORY_CARE_PROVIDER_SITE_OTHER): Payer: Medicare Other | Admitting: Pharmacist

## 2022-08-18 DIAGNOSIS — I480 Paroxysmal atrial fibrillation: Secondary | ICD-10-CM

## 2022-08-18 DIAGNOSIS — I10 Essential (primary) hypertension: Secondary | ICD-10-CM

## 2022-08-18 NOTE — Patient Instructions (Signed)
Hi Maurice,  It was great to catch up with you again! I am glad you are doing so well. Don't forget to update [place] once you get the COVID booster and newer shingles shot so we can update your record.  Please reach out to me if you have any questions or need anything before our follow up!  Best, Maddie  Jeni Salles, PharmD, [facility]  Visit Information   Goals Addressed   None    Patient Care Plan: CCM Pharmacy Care Plan     Problem Identified: Problem: Hypertension, Hyperlipidemia, Atrial Fibrillation, Coronary Artery Disease, GERD, BPH, Allergic Rhinitis and Glaucoma      Long-Range Goal: Patient-Specific Goal   Start Date: 04/13/2021  Expected End Date: 04/13/2022  Recent Progress: On track  Priority: High  Note:   Current Barriers:  Unable to independently monitor therapeutic efficacy  Pharmacist Clinical Goal(s):  Patient will achieve adherence to monitoring guidelines and medication adherence to achieve therapeutic efficacy through collaboration with PharmD and provider.   Interventions: 1:1 collaboration with [name], MD regarding development and update of comprehensive plan of care as evidenced by provider attestation and co-signature Inter-disciplinary care team collaboration (see longitudinal plan of care) Comprehensive medication review performed; medication list updated in electronic medical record  Hypertension (BP goal <140/90) -Controlled -Current treatment: Lisinopril 20 mg 1/2 tablet daily - Appropriate, Effective, Safe, Accessible Metoprolol tartrate 12.5 mg 1 tablet twice daily - Appropriate, Effective, Safe, Accessible -Medications previously tried: none  -Current home readings: 133/61, 129/56 HR , 121/68, 129/68, 128/79 (wrist cuff) -Current dietary habits: no changes -Current exercise habits: slightly less active lately -Denies hypotensive/hypertensive symptoms -Educated on BP  goals and benefits of medications for prevention of heart attack, stroke and kidney damage; Exercise goal of 150 minutes per week; Importance of home blood pressure monitoring; Proper BP monitoring technique; -Counseled to monitor BP at home weekly, document, and provide log at future appointments -Counseled on diet and exercise extensively Recommended to continue current medication Recommended bringing BP cuff to next office visit to ensure accuracy.  Hyperlipidemia: (LDL goal < 70) -Controlled -Current treatment: Atorvastatin 80 mg 1 tablet daily at bedtime - Appropriate, Effective, Safe, Accessible -Medications previously tried: none -Current dietary patterns: did not discuss -Current exercise habits: slightly less active lately -Educated on Cholesterol goals;  Benefits of statin for ASCVD risk reduction; Exercise goal of 150 minutes per week; -Counseled on diet and exercise extensively Recommended to continue current medication  Atrial Fibrillation (Goal: prevent stroke and major bleeding) -Controlled -CHADSVASC: 3 -Current treatment: Rate control:  Diltiazem 30 mg 1 tablet every 4 hours as needed for HR > 100 bpm, Metoprolol tartrate 12.5 mg 1 tablet twice daily - Appropriate, Effective, Safe, Accessible Anticoagulation:  Warfarin 5 mg tablet 7.5 mg on Mon and Fri and 5 mg on all other days - Appropriate, Effective, Safe, Accessible -Medications previously tried: none -Home BP and HR readings: mostly 60s,  -Counseled on importance of adherence to anticoagulant exactly as prescribed; avoidance of NSAIDs due to increased bleeding risk with anticoagulants; -Recommended to continue current medication Counseled on triggers of Afib including chocolate and caffeine.  CAD (Goal: prevent heart events) -Controlled -Current treatment  Warfarin 5 mg tablet 7.5 mg as directed by coumadin clinic - Appropriate, Effective, Safe, Accessible Nitroglycerin 0.4 mg SL tablet as needed -  Appropriate, Effective, Safe, Accessible Atorvastatin 80 mg 1 tablet daily - Appropriate, Effective, Safe, Accessible -Medications previously tried: none  -Recommended  to continue current medication  Lower extremity edema (Goal: minimize swelling) -Controlled -Current treatment  Furosemide 20 mg daily as needed - Appropriate, Effective, Safe, Accessible -Medications previously tried: none  -Recommended to continue current medication  BPH (Goal: minimize symptoms) -Controlled -Current treatment  Tamsulosin 0.4 mg 1 capsule twice daily - Appropriate, Effective, Safe, Accessible Finasteride 5 mg 1 tablet daily - Appropriate, Effective, Safe, Accessible -Medications previously tried: none  -Recommended to continue current medication  GERD (Goal: minimize symptoms) -Controlled -Current treatment  Pantoprazole 40 mg 1 tablet daily - Appropriate, Effective, Safe, Accessible -Medications previously tried: none  -Recommended to continue current medication  Allergic rhinitis (Goal: minimize symptoms) -Controlled -Current treatment  Claritin 10 mg 1 tablet daily in the morning Mometasone 50 mcg/act 2 sprays in both nostrils at bedtime -Medications previously tried: Zyrtec (duplication)  -Recommended to continue current medication  Glaucoma (Goal: lower intraocular pressure) -Controlled -Current treatment  Latanoprost 0.005% 1 drop in both eyes at bedtime -Medications previously tried: none  -Recommended to continue current medication   Health Maintenance -Vaccine gaps: Shingrix -Current therapy:  Acetaminophen 500 mg 1 capsule twice daily Centrum silver 1 tablet daily Potassium chloride 10 mEq 1 tablet every morning Tolak 4% cream (prescribed by dermatologist, sees every 6 months - Dr. Pearline Cables) -Educated on Cost vs benefit of each product must be carefully weighed by individual consumer -Patient is satisfied with current therapy and denies issues -Recommended to continue current  medication  Patient Goals/Self-Care Activities Patient will:  - take medications as prescribed check blood pressure weekly, document, and provide at future appointments target a minimum of 150 minutes of moderate intensity exercise weekly  Follow Up Plan: Telephone follow up appointment with care management team member scheduled for: 1 year       Patient verbalizes understanding of instructions and care plan provided today and agrees to view in [employer]. Active MyChart status and patient understanding of how to access instructions and care plan via MyChart confirmed with patient.    Telephone follow up appointment with pharmacy team member scheduled for: 1 year  Viona Gilmore, [facility]

## 2022-08-23 DIAGNOSIS — Z23 Encounter for immunization: Secondary | ICD-10-CM | POA: Diagnosis not present

## 2022-09-03 ENCOUNTER — Ambulatory Visit: Payer: Medicare Other | Attending: Cardiology | Admitting: *Deleted

## 2022-09-03 DIAGNOSIS — I480 Paroxysmal atrial fibrillation: Secondary | ICD-10-CM

## 2022-09-03 DIAGNOSIS — Z5181 Encounter for therapeutic drug level monitoring: Secondary | ICD-10-CM | POA: Diagnosis not present

## 2022-09-03 LAB — POCT INR: INR: 3.3 — AB (ref 2.0–3.0)

## 2022-09-03 NOTE — Patient Instructions (Addendum)
Description   Do not take any warfarin today then continue taking warfarin 1 tablet daily except for 1.5 tablets on Fridays.  Recheck INR in 3 weeks. Call with any questions or concerns [phone] or [phone]

## 2022-09-07 DIAGNOSIS — I10 Essential (primary) hypertension: Secondary | ICD-10-CM | POA: Diagnosis not present

## 2022-09-07 DIAGNOSIS — I2510 Atherosclerotic heart disease of native coronary artery without angina pectoris: Secondary | ICD-10-CM | POA: Diagnosis not present

## 2022-09-07 DIAGNOSIS — I4891 Unspecified atrial fibrillation: Secondary | ICD-10-CM

## 2022-09-07 DIAGNOSIS — E785 Hyperlipidemia, unspecified: Secondary | ICD-10-CM | POA: Diagnosis not present

## 2022-09-07 DIAGNOSIS — N400 Enlarged prostate without lower urinary tract symptoms: Secondary | ICD-10-CM

## 2022-09-17 ENCOUNTER — Telehealth: Payer: Self-pay | Admitting: *Deleted

## 2022-09-17 NOTE — Telephone Encounter (Signed)
No need to hold warfarin prior to 1 extraction. Can provide INR day of or before procedure if they would like.

## 2022-09-17 NOTE — Telephone Encounter (Signed)
   Primary Cardiologist: [name], MD  Chart reviewed as part of pre-operative protocol coverage. Simple dental extractions are considered low risk procedures per guidelines and generally do not require any specific cardiac clearance. It is also generally accepted that for simple extractions and dental cleanings, there is no need to interrupt blood thinner therapy. We can provide INR on the day prior to the extraction if needed. His INR goal is 2.0-3.0.  SBE prophylaxis is not required for the patient.  I will route this recommendation to the requesting party via Epic fax function and remove from pre-op pool.  Please call with questions.  Emmaline Life, NP-C  09/17/2022, 2:34 PM [address], [address] Office [phone] Fax [phone]

## 2022-09-17 NOTE — Telephone Encounter (Signed)
   Pre-operative Risk Assessment    Patient Name: [name] [name]  DOB: 1929/06/23 MRN: [id number]     Request for Surgical Clearance    Procedure:  extraction of 1 tooth 1 Date of Surgery:  Clearance TBD                                 Surgeon:  Dr Roger Kill Group or Practice Name:  [facility] Phone number:  [phone] Fax number:  [phone]   Type of Clearance Requested:   - Pharmacy:  Hold Warfarin (Coumadin) they would like the INR 2.5 or lower   Type of Anesthesia:  Not Indicated   Additional requests/questions:    Arman Filter   09/17/2022, 8:03 AM

## 2022-09-21 ENCOUNTER — Encounter: Payer: Self-pay | Admitting: Family Medicine

## 2022-09-21 ENCOUNTER — Ambulatory Visit (INDEPENDENT_AMBULATORY_CARE_PROVIDER_SITE_OTHER): Payer: Medicare Other | Admitting: Family Medicine

## 2022-09-21 VITALS — BP 110/60 | HR 63 | Temp 98.3°F | Ht 71.0 in | Wt 199.0 lb

## 2022-09-21 DIAGNOSIS — N401 Enlarged prostate with lower urinary tract symptoms: Secondary | ICD-10-CM | POA: Diagnosis not present

## 2022-09-21 DIAGNOSIS — I495 Sick sinus syndrome: Secondary | ICD-10-CM | POA: Diagnosis not present

## 2022-09-21 DIAGNOSIS — I5032 Chronic diastolic (congestive) heart failure: Secondary | ICD-10-CM | POA: Diagnosis not present

## 2022-09-21 DIAGNOSIS — E782 Mixed hyperlipidemia: Secondary | ICD-10-CM

## 2022-09-21 DIAGNOSIS — I10 Essential (primary) hypertension: Secondary | ICD-10-CM

## 2022-09-21 DIAGNOSIS — N138 Other obstructive and reflux uropathy: Secondary | ICD-10-CM

## 2022-09-21 DIAGNOSIS — R739 Hyperglycemia, unspecified: Secondary | ICD-10-CM

## 2022-09-21 DIAGNOSIS — I2510 Atherosclerotic heart disease of native coronary artery without angina pectoris: Secondary | ICD-10-CM

## 2022-09-21 DIAGNOSIS — K5900 Constipation, unspecified: Secondary | ICD-10-CM

## 2022-09-21 DIAGNOSIS — K219 Gastro-esophageal reflux disease without esophagitis: Secondary | ICD-10-CM | POA: Diagnosis not present

## 2022-09-21 DIAGNOSIS — Z9861 Coronary angioplasty status: Secondary | ICD-10-CM | POA: Diagnosis not present

## 2022-09-21 DIAGNOSIS — I480 Paroxysmal atrial fibrillation: Secondary | ICD-10-CM

## 2022-09-21 LAB — CBC WITH DIFFERENTIAL/PLATELET
Basophils Absolute: 0.1 10*3/uL (ref 0.0–0.1)
Basophils Relative: 0.6 % (ref 0.0–3.0)
Eosinophils Absolute: 0.2 10*3/uL (ref 0.0–0.7)
Eosinophils Relative: 1.5 % (ref 0.0–5.0)
HCT: 41.8 % (ref 39.0–52.0)
Hemoglobin: 13.9 g/dL (ref 13.0–17.0)
Lymphocytes Relative: 19.4 % (ref 12.0–46.0)
Lymphs Abs: 2 10*3/uL (ref 0.7–4.0)
MCHC: 33.3 g/dL (ref 30.0–36.0)
MCV: 98.3 fl (ref 78.0–100.0)
Monocytes Absolute: 0.9 10*3/uL (ref 0.1–1.0)
Monocytes Relative: 8.5 % (ref 3.0–12.0)
Neutro Abs: 7.1 10*3/uL (ref 1.4–7.7)
Neutrophils Relative %: 70 % (ref 43.0–77.0)
Platelets: 181 10*3/uL (ref 150.0–400.0)
RBC: 4.25 Mil/uL (ref 4.22–5.81)
RDW: 13.5 % (ref 11.5–15.5)
WBC: 10.2 10*3/uL (ref 4.0–10.5)

## 2022-09-21 LAB — HEPATIC FUNCTION PANEL
ALT: 28 U/L (ref 0–53)
AST: 25 U/L (ref 0–37)
Albumin: 4.5 g/dL (ref 3.5–5.2)
Alkaline Phosphatase: 60 U/L (ref 39–117)
Bilirubin, Direct: 0.2 mg/dL (ref 0.0–0.3)
Total Bilirubin: 0.7 mg/dL (ref 0.2–1.2)
Total Protein: 6.7 g/dL (ref 6.0–8.3)

## 2022-09-21 LAB — TSH: TSH: 1.72 u[IU]/mL (ref 0.35–5.50)

## 2022-09-21 LAB — LIPID PANEL
Cholesterol: 131 mg/dL (ref 0–200)
HDL: 46.1 mg/dL (ref >39.00)
LDL Cholesterol: 62 mg/dL (ref 0–99)
NonHDL: 85.28
Total CHOL/HDL Ratio: 3
Triglycerides: 115 mg/dL (ref 0.0–149.0)
VLDL: 23 mg/dL (ref 0.0–40.0)

## 2022-09-21 LAB — BASIC METABOLIC PANEL
BUN: 25 mg/dL — ABNORMAL HIGH (ref 6–23)
CO2: 31 mEq/L (ref 19–32)
Calcium: 9.3 mg/dL (ref 8.4–10.5)
Chloride: 103 mEq/L (ref 96–112)
Creatinine, Ser: 1.03 mg/dL (ref 0.40–1.50)
GFR: 63.63 mL/min (ref >60.00)
Glucose, Bld: 111 mg/dL — ABNORMAL HIGH (ref 70–99)
Potassium: 4.5 mEq/L (ref 3.5–5.1)
Sodium: 141 mEq/L (ref 135–145)

## 2022-09-21 LAB — HEMOGLOBIN A1C: Hgb A1c MFr Bld: 6.3 % (ref 4.6–6.5)

## 2022-09-21 LAB — PSA: PSA: 0.18 ng/mL (ref 0.10–4.00)

## 2022-09-21 NOTE — Progress Notes (Signed)
Subjective:    Patient ID: [name] [name], male    DOB: 08-03-1929, 88 y.o.   MRN: [id number]  HPI Here to follow up on issues. He feels well in general. His only complaint is poor balance, but he is very careful and he uses his cane all the time. He has never fallen. His BP is stable. He has had 3 episodes of atrial fibrillation in the past 6 months, the longest lasted 12 hours. He tolerates these episodes well however, and they usually stop after he takes a Diltiazem. He saw Dr. Percival Spanish on 07-14-22, and he was pleased with his progress. His bowels are moving well. He only gets up once at night to urinate. His GERD is stable.    Review of Systems  Constitutional: Negative.   HENT: Negative.    Eyes: Negative.   Respiratory: Negative.    Cardiovascular: Negative.   Gastrointestinal: Negative.   Genitourinary: Negative.   Musculoskeletal: Negative.   Skin: Negative.   Neurological: Negative.   Psychiatric/Behavioral: Negative.         Objective:   Physical Exam Constitutional:      General: He is not in acute distress.    Appearance: Normal appearance. He is well-developed. He is not diaphoretic.  HENT:     Head: Normocephalic and atraumatic.     Right Ear: External ear normal.     Left Ear: External ear normal.     Nose: Nose normal.     Mouth/Throat:     Pharynx: No oropharyngeal exudate.  Eyes:     General: No scleral icterus.       Right eye: No discharge.        Left eye: No discharge.     Conjunctiva/sclera: Conjunctivae normal.     Pupils: Pupils are equal, round, and reactive to light.  Neck:     Thyroid: No thyromegaly.     Vascular: No JVD.     Trachea: No tracheal deviation.  Cardiovascular:     Rate and Rhythm: Normal rate and regular rhythm.     Heart sounds: Normal heart sounds. No murmur heard.    No friction rub. No gallop.  Pulmonary:     Effort: Pulmonary effort is normal. No respiratory distress.     Breath sounds: Normal breath sounds. No  wheezing or rales.  Chest:     Chest wall: No tenderness.  Abdominal:     General: Bowel sounds are normal. There is no distension.     Palpations: Abdomen is soft. There is no mass.     Tenderness: There is no abdominal tenderness. There is no guarding or rebound.  Genitourinary:    Penis: Normal. No tenderness.      Testes: Normal.  Musculoskeletal:        General: No tenderness. Normal range of motion.     Cervical back: Neck supple.     Comments: 2+ edema in both ankles   Lymphadenopathy:     Cervical: No cervical adenopathy.  Skin:    General: Skin is warm and dry.     Coloration: Skin is not pale.     Findings: No erythema or rash.  Neurological:     Mental Status: He is alert and oriented to person, place, and time.     Cranial Nerves: No cranial nerve deficit.     Motor: No abnormal muscle tone.     Coordination: Coordination normal.     Deep Tendon Reflexes: Reflexes are normal and symmetric.  Reflexes normal.  Psychiatric:        Behavior: Behavior normal.        Thought Content: Thought content normal.        Judgment: Judgment normal.           Assessment & Plan:  He seems to be doing well overall. His atrial fibrillation and HTN and CAD are stable. His constipation and GERD and BPH are stable. We will get fasting labs to check lipids, etc. He is up to date on immunizations. We spent a total of (33   ) minutes reviewing records and discussing these issues.  [name], MD

## 2022-09-24 ENCOUNTER — Ambulatory Visit: Payer: Medicare Other | Attending: Cardiology

## 2022-09-24 DIAGNOSIS — Z5181 Encounter for therapeutic drug level monitoring: Secondary | ICD-10-CM

## 2022-09-24 DIAGNOSIS — I480 Paroxysmal atrial fibrillation: Secondary | ICD-10-CM

## 2022-09-24 LAB — POCT INR: INR: 3.3 — AB (ref 2.0–3.0)

## 2022-09-24 NOTE — Patient Instructions (Signed)
HOLD TODAY ONLY  then DECREASE TO  1 tablet daily. HOLD 11/25 FOR DENTAL WORK. Recheck INR in 2 weeks. Call with any questions or concerns [phone] or [phone]

## 2022-10-04 ENCOUNTER — Ambulatory Visit: Payer: Medicare Other | Attending: Cardiology | Admitting: *Deleted

## 2022-10-04 DIAGNOSIS — Z5181 Encounter for therapeutic drug level monitoring: Secondary | ICD-10-CM

## 2022-10-04 DIAGNOSIS — I480 Paroxysmal atrial fibrillation: Secondary | ICD-10-CM

## 2022-10-04 LAB — POCT INR: INR: 2.1 (ref 2.0–3.0)

## 2022-10-04 NOTE — Patient Instructions (Signed)
Description   Continue taking warfarin 1 tablet daily. Recheck INR in 3 weeks. Call with any questions or concerns [phone] or [phone]

## 2022-10-11 ENCOUNTER — Telehealth: Payer: Self-pay

## 2022-10-11 NOTE — Telephone Encounter (Signed)
---  Caller states he had an upper left tooth removed. Since Wednesday he's developed nasal discharge and sneezing. His chest feels clear. He states he thinks he may have the flu. He has had hot flashes but denies fever. Occasional cough. He is currently taking penicillin for the tooth extraction. He is allergic to sulfa drugs. Pharmacy: [facility] on [address]; ph: [phone]  10/09/2022 2:31:05 PM [facility], RN, [place]  Comments User: Dimas Chyle, RN Date/Time Eilene Ghazi Time): 10/09/2022 2:23:42 PM caller takes metoprolol for htn  Care Advice Given Per Guideline HOME CARE: * You should be able to treat this at home. * Sinus congestion is a normal part of a cold. REASSURANCE AND EDUCATION - COLDS AND SINUS CONGESTION: * Usually home treatment with nasal washes can prevent an actual bacterial sinus infection. * Antibiotics are not helpful for the sinus congestion that occurs with colds. * Here is some care advice that should help. * Introduction: Saline (salt water) nasal irrigation (nasal wash) is an effective and simple home remedy for treating stuffy nose and sinus congestion. The nose can be irrigated by pouring, spraying, or squirting salt water into the nose and then letting it run back out. * How it Helps: The salt water rinses out excess mucus and washes out any irritants (dust, allergens) that might be present. It also moistens the nasal cavity. CALL BACK IF: * Severe pain persists over 2 hours after pain medicine * Sinus pain persists over 1 day after using nasal washes * Sinus congestion (fullness) persists over 10 days * Fever lasts over 3 days * You become worse CARE ADVICE given per Sinus Pain or Congestion (Adult) guideline. * Sinus congestion from viral upper respiratory infections (colds) usually lasts 5 to 10 days.

## 2022-10-25 DIAGNOSIS — H401132 Primary open-angle glaucoma, bilateral, moderate stage: Secondary | ICD-10-CM | POA: Diagnosis not present

## 2022-10-25 DIAGNOSIS — Z961 Presence of intraocular lens: Secondary | ICD-10-CM | POA: Diagnosis not present

## 2022-10-29 ENCOUNTER — Ambulatory Visit: Payer: Medicare Other | Attending: Cardiology | Admitting: *Deleted

## 2022-10-29 DIAGNOSIS — Z5181 Encounter for therapeutic drug level monitoring: Secondary | ICD-10-CM

## 2022-10-29 DIAGNOSIS — I480 Paroxysmal atrial fibrillation: Secondary | ICD-10-CM

## 2022-10-29 LAB — POCT INR: INR: 2.5 (ref 2.0–3.0)

## 2022-10-29 NOTE — Patient Instructions (Signed)
Description   Continue taking warfarin 1 tablet daily. Recheck INR in 4 weeks. Call with any questions or concerns [phone] or [phone]

## 2022-11-26 ENCOUNTER — Ambulatory Visit: Payer: Medicare Other

## 2022-11-26 ENCOUNTER — Telehealth: Payer: Medicare Other | Admitting: Physician Assistant

## 2022-11-26 ENCOUNTER — Telehealth: Payer: Self-pay | Admitting: Family Medicine

## 2022-11-26 DIAGNOSIS — H109 Unspecified conjunctivitis: Secondary | ICD-10-CM

## 2022-11-26 DIAGNOSIS — B9689 Other specified bacterial agents as the cause of diseases classified elsewhere: Secondary | ICD-10-CM

## 2022-11-26 MED ORDER — PROMETHAZINE-DM 6.25-15 MG/5ML PO SYRP: 5.0000 mL | ORAL_SOLUTION | Freq: Four times a day (QID) | ORAL | 0 refills | Status: AC | PRN

## 2022-11-26 MED ORDER — POLYMYXIN B-TRIMETHOPRIM 10000-0.1 UNIT/ML-% OP SOLN: 1.0000 [drp] | OPHTHALMIC | 0 refills | Status: AC

## 2022-11-26 MED ORDER — BENZONATATE 100 MG PO CAPS: 100.0000 mg | ORAL_CAPSULE | Freq: Three times a day (TID) | ORAL | 0 refills | Status: DC | PRN

## 2022-11-26 MED ORDER — AMOXICILLIN-POT CLAVULANATE 875-125 MG PO TABS: 1.0000 | ORAL_TABLET | Freq: Two times a day (BID) | ORAL | 0 refills | Status: DC

## 2022-11-26 NOTE — Patient Instructions (Signed)
[name] [name], thank you for joining Mar Daring, PA-C for today's virtual visit.  While this provider is not your primary care provider (PCP), if your PCP is located in our provider database this encounter information will be shared with them immediately following your visit.   [place] account gives you access to today's visit and all your visits, tests, and labs performed at [facility] " click here if you don't have a [facility] account or go to [email or web address]  Consent: (Patient) [name] [name] provided verbal consent for this virtual visit at the beginning of the encounter.  Current Medications:  Current Outpatient Medications:    amoxicillin-clavulanate (AUGMENTIN) 875-125 MG tablet, Take 1 tablet by mouth 2 (two) times daily., Disp: 20 tablet, Rfl: 0   benzonatate (TESSALON) 100 MG capsule, Take 1 capsule (100 mg total) by mouth 3 (three) times daily as needed., Disp: 30 capsule, Rfl: 0   promethazine-dextromethorphan (PROMETHAZINE-DM) 6.25-15 MG/5ML syrup, Take 5 mLs by mouth 4 (four) times daily as needed., Disp: 118 mL, Rfl: 0   trimethoprim-polymyxin b (POLYTRIM) ophthalmic solution, Place 1 drop into the left eye every 4 (four) hours., Disp: 10 mL, Rfl: 0   atorvastatin (LIPITOR) 80 MG tablet, Take 1 tablet (80 mg total) by mouth daily., Disp: 90 tablet, Rfl: 3   CLARITIN 10 MG CAPS, Take 1 capsule by mouth at bedtime., Disp: , Rfl:    [employer] ACETAMINOPHEN 325 MG CAPS, Take 1 capsule by mouth 2 (two) times daily. , Disp: , Rfl:    diltiazem (CARDIZEM) 30 MG tablet, Take 1 tablet every 4 hours AS NEEDED for afib heart rate over 100, Disp: 45 tablet, Rfl: 1   finasteride (PROSCAR) 5 MG tablet, Take 5 mg daily by mouth., Disp: , Rfl:    furosemide (LASIX) 40 MG tablet, TAKE 1 TABLET BY MOUTH DAILY AS NEEDED FOR FLUID (INCREASED SHORTNESS OF BREATH, LEG EDEMA, WEIGHT GAIN > 5 POUNDS IN 1 WEEK), Disp: 30 tablet, Rfl: 6   latanoprost  (XALATAN) 0.005 % ophthalmic solution, Place 1 drop into both eyes at bedtime. , Disp: , Rfl:    lisinopril (PRINIVIL,ZESTRIL) 10 MG tablet, Take 10 mg by mouth at bedtime. , Disp: , Rfl:    melatonin 5 MG TABS, Take 5 mg by mouth as needed., Disp: , Rfl:    metoprolol tartrate (LOPRESSOR) 12.5 mg TABS tablet, Take 12.5 mg by mouth 2 (two) times daily., Disp: , Rfl:    mometasone (NASONEX) 50 MCG/ACT nasal spray, Place 2 sprays into both nostrils daily as needed (allergies)., Disp: , Rfl:    Multiple Vitamins-Minerals (CENTRUM SILVER PO), Take 1 tablet by mouth every morning., Disp: , Rfl:    pantoprazole (PROTONIX) 40 MG tablet, Take 1 tablet (40 mg total) by mouth daily., Disp: 90 tablet, Rfl: 3   potassium chloride (KLOR-CON) 10 MEQ CR tablet, Take 10 mEq by mouth every morning., Disp: , Rfl:    tamsulosin (FLOMAX) 0.4 MG CAPS, Take 0.4 mg by mouth 2 (two) times daily. , Disp: , Rfl:    TOLAK 4 % CREA, Apply 1 application topically daily as needed (rough place on scalp)., Disp: , Rfl:    warfarin (COUMADIN) 5 MG tablet, TAKE 1-2 TABLETS BY MOUTH DAILY AS DIRECTED BY THE [facility] CLINIC, Disp: 135 tablet, Rfl: 1   Medications ordered in this encounter:  Meds ordered this encounter  Medications   amoxicillin-clavulanate (AUGMENTIN) 875-125 MG tablet    Sig: Take 1 tablet  by mouth 2 (two) times daily.    Dispense:  20 tablet    Refill:  0    Order Specific Question:   Supervising Provider    Answer:   Chase Picket [id number]   trimethoprim-polymyxin b (POLYTRIM) ophthalmic solution    Sig: Place 1 drop into the left eye every 4 (four) hours.    Dispense:  10 mL    Refill:  0    Order Specific Question:   Supervising Provider    Answer:   Chase Picket [id number]   promethazine-dextromethorphan (PROMETHAZINE-DM) 6.25-15 MG/5ML syrup    Sig: Take 5 mLs by mouth 4 (four) times daily as needed.    Dispense:  118 mL    Refill:  0    Order Specific Question:   Supervising Provider     Answer:   Chase Picket [[id number]]   benzonatate (TESSALON) 100 MG capsule    Sig: Take 1 capsule (100 mg total) by mouth 3 (three) times daily as needed.    Dispense:  30 capsule    Refill:  0    Order Specific Question:   Supervising Provider    Answer:   Chase Picket [id number]     *If you need refills on other medications prior to your next appointment, please contact your pharmacy*  Follow-Up: Call back or seek an in-person evaluation if the symptoms worsen or if the condition fails to improve as anticipated.  [place] [phone]  Other Instructions  Upper Respiratory Infection, Adult An upper respiratory infection (URI) is a common viral infection of the nose, throat, and upper air passages that lead to the lungs. The most common type of URI is the common cold. URIs usually get better on their own, without medical treatment. What are the causes? A URI is caused by a virus. You may catch a virus by: Breathing in droplets from an infected person's cough or sneeze. Touching something that has been exposed to the virus (is contaminated) and then touching your mouth, nose, or eyes. What increases the risk? You are more likely to get a URI if: You are very young or very old. You have close contact with others, such as at work, school, or a health care facility. You smoke. You have long-term (chronic) heart or lung disease. You have a weakened disease-fighting system (immune system). You have nasal allergies or asthma. You are experiencing a lot of stress. You have poor nutrition. What are the signs or symptoms? A URI usually involves some of the following symptoms: Runny or stuffy (congested) nose. Cough. Sneezing. Sore throat. Headache. Fatigue. Fever. Loss of appetite. Pain in your forehead, behind your eyes, and over your cheekbones (sinus pain). Muscle aches. Redness or irritation of the eyes. Pressure in the ears or face. How is this  diagnosed? This condition may be diagnosed based on your medical history and symptoms, and a physical exam. Your health care provider may use a swab to take a mucus sample from your nose (nasal swab). This sample can be tested to determine what virus is causing the illness. How is this treated? URIs usually get better on their own within 7-10 days. Medicines cannot cure URIs, but your health care provider may recommend certain medicines to help relieve symptoms, such as: Over-the-counter cold medicines. Cough suppressants. Coughing is a type of defense against infection that helps to clear the respiratory system, so take these medicines only as recommended by your health care provider. Fever-reducing  medicines. Follow these instructions at home: Activity Rest as needed. If you have a fever, stay home from work or school until your fever is gone or until your health care provider says your URI cannot spread to other people (is no longer contagious). Your health care provider may have you wear a face mask to prevent your infection from spreading. Relieving symptoms Gargle with a mixture of salt and water 3-4 times a day or as needed. To make salt water, completely dissolve -1 tsp (3-6 g) of salt in 1 cup (237 mL) of warm water. Use a cool-mist humidifier to add moisture to the air. This can help you breathe more easily. Eating and drinking  Drink enough fluid to keep your urine pale yellow. Eat soups and other clear broths. General instructions  Take over-the-counter and prescription medicines only as told by your health care provider. These include cold medicines, fever reducers, and cough suppressants. Do not use any products that contain nicotine or tobacco. These products include cigarettes, chewing tobacco, and vaping devices, such as e-cigarettes. If you need help quitting, ask your health care provider. Stay away from secondhand smoke. Stay up to date on all immunizations, including the  yearly (annual) flu vaccine. Keep all follow-up visits. This is important. How to prevent the spread of infection to others URIs can be contagious. To prevent the infection from spreading: Wash your hands with soap and water for at least 20 seconds. If soap and water are not available, use hand sanitizer. Avoid touching your mouth, face, eyes, or nose. Cough or sneeze into a tissue or your sleeve or elbow instead of into your hand or into the air.  Contact a health care provider if: You are getting worse instead of better. You have a fever or chills. Your mucus is brown or red. You have yellow or brown discharge coming from your nose. You have pain in your face, especially when you bend forward. You have swollen neck glands. You have pain while swallowing. You have white areas in the back of your throat. Get help right away if: You have shortness of breath that gets worse. You have severe or persistent: Headache. Ear pain. Sinus pain. Chest pain. You have chronic lung disease along with any of the following: Making high-pitched whistling sounds when you breathe, most often when you breathe out (wheezing). Prolonged cough (more than 14 days). Coughing up blood. A change in your usual mucus. You have a stiff neck. You have changes in your: Vision. Hearing. Thinking. Mood. These symptoms may be an emergency. Get help right away. Call 911. Do not wait to see if the symptoms will go away. Do not drive yourself to the hospital. Summary An upper respiratory infection (URI) is a common infection of the nose, throat, and upper air passages that lead to the lungs. A URI is caused by a virus. URIs usually get better on their own within 7-10 days. Medicines cannot cure URIs, but your health care provider may recommend certain medicines to help relieve symptoms. This information is not intended to replace advice given to you by your health care provider. Make sure you discuss any  questions you have with your health care provider. Document Revised: 05/27/2021 Document Reviewed: 05/27/2021 Elsevier Patient Education  [place].    If you have been instructed to have an in-person evaluation today at a local Urgent Care facility, please use the link below. It will take you to a list of all of our available [facility], including address, phone number and hours of operation. Please do not delay care.  [facility]  If you or a family member do not have a primary care provider, use the link below to schedule a visit and establish care. When you choose a [facility] primary care physician or advanced practice provider, you gain a long-term partner in health. Find a Primary Care Provider  Learn more about [facility] in-office and virtual care options: [facility]

## 2022-11-26 NOTE — Telephone Encounter (Addendum)
Pt called to say he has a very bad cough since Monday with cold symptoms.  Pt stated he took several covid tests and tested negative.  Pt says he has no way of coming into the office and cannot drive.  Pt stated he can send his son to the pharmacy for him.   Pt added that his pharmacy does not carry the hycodan sent last time.   Please advise.  LOV:  09/21/22   [facility] - [place], [place] Phone: [phone]  Fax: [phone]

## 2022-11-26 NOTE — Telephone Encounter (Signed)
I see he had an E visit today

## 2022-11-26 NOTE — Progress Notes (Signed)
Virtual Visit Consent   ASH MCELWAIN, you are scheduled for a virtual visit with a [facility] provider today. Just as with appointments in the office, your consent must be obtained to participate. Your consent will be active for this visit and any virtual visit you may have with one of our providers in the next 365 days. If you have a MyChart account, a copy of this consent can be sent to you electronically.  As this is a virtual visit, video technology does not allow for your provider to perform a traditional examination. This may limit your provider's ability to fully assess your condition. If your provider identifies any concerns that need to be evaluated in person or the need to arrange testing (such as labs, EKG, etc.), we will make arrangements to do so. Although advances in technology are sophisticated, we cannot ensure that it will always work on either your end or our end. If the connection with a video visit is poor, the visit may have to be switched to a telephone visit. With either a video or telephone visit, we are not always able to ensure that we have a secure connection.  By engaging in this virtual visit, you consent to the provision of healthcare and authorize for your insurance to be billed (if applicable) for the services provided during this visit. Depending on your insurance coverage, you may receive a charge related to this service.  I need to obtain your verbal consent now. Are you willing to proceed with your visit today? KARLIS CREGG has provided verbal consent on 11/26/2022 for a virtual visit (video or telephone). Mar Daring, PA-C  Date: 11/26/2022 12:53 PM  Virtual Visit via Video Note   I, Mar Daring, connected with  [name] [name]  ([id number], 12-13-1928) on 11/26/22 at 12:45 PM EST by a video-enabled telemedicine application and verified that I am speaking with the correct person using two identifiers.  Location: Patient: Virtual Visit Location  Patient: Home Provider: Virtual Visit Location Provider: Home Office   I discussed the limitations of evaluation and management by telemedicine and the availability of in person appointments. The patient expressed understanding and agreed to proceed.    History of Present Illness: [name] [name] is a 89 y.o. who identifies as a male who was assigned male at birth, and is being seen today for URI symptoms.  HPI: URI  This is a new problem. The current episode started 1 to 4 weeks ago (Thursday last week). The problem has been gradually worsening. There has been no fever. Associated symptoms include congestion, coughing, headaches, rhinorrhea and sinus pain. Pertinent negatives include no diarrhea, ear pain, nausea, plugged ear sensation, sore throat, vomiting or wheezing. Associated symptoms comments: Red and purulent drainage in left eye. He has tried acetaminophen and decongestant for the symptoms. The treatment provided no relief.     Problems:  Patient Active Problem List   Diagnosis Date Noted   Atherosclerosis of aorta ([place]) 09/16/2021   Open-angle glaucoma 09/16/2021   Chronic diastolic HF (heart failure) ([place]) 06/30/2021   Atrial fibrillation ([place]) 06/09/2021   Constipation 02/06/2021   Coronary artery disease involving native coronary artery of native heart without angina pectoris 10/15/2019   Educated about COVID-19 virus infection 10/15/2019   Near syncope 06/10/2017   Chest pain with moderate risk for cardiac etiology 06/10/2017   CAD S/P percutaneous coronary angioplasty    Abnormal stress test    PAF (paroxysmal atrial fibrillation) ([place]) 11/12/2014  Encounter for therapeutic drug monitoring 12/03/2013   PVC's (premature ventricular contractions) 01/26/2012   Tachycardia-bradycardia syndrome ([place]) 01/26/2012   Long term (current) use of anticoagulants 09/06/2011   GERD 08/01/2007   History of colonic polyps 08/01/2007   Personal history of urinary calculi 08/01/2007    BPH with urinary obstruction 08/01/2007   Hyperlipidemia 07/04/2007   Essential hypertension 07/04/2007   ALLERGIC RHINITIS 07/04/2007    Allergies:  Allergies  Allergen Reactions   Sulfa Antibiotics Other (See Comments)    Severe peeling of the skin on the groin area   Sulfamethoxazole Other (See Comments)    REACTION: rash   Medications:  Current Outpatient Medications:    amoxicillin-clavulanate (AUGMENTIN) 875-125 MG tablet, Take 1 tablet by mouth 2 (two) times daily., Disp: 20 tablet, Rfl: 0   benzonatate (TESSALON) 100 MG capsule, Take 1 capsule (100 mg total) by mouth 3 (three) times daily as needed., Disp: 30 capsule, Rfl: 0   promethazine-dextromethorphan (PROMETHAZINE-DM) 6.25-15 MG/5ML syrup, Take 5 mLs by mouth 4 (four) times daily as needed., Disp: 118 mL, Rfl: 0   trimethoprim-polymyxin b (POLYTRIM) ophthalmic solution, Place 1 drop into the left eye every 4 (four) hours., Disp: 10 mL, Rfl: 0   atorvastatin (LIPITOR) 80 MG tablet, Take 1 tablet (80 mg total) by mouth daily., Disp: 90 tablet, Rfl: 3   CLARITIN 10 MG CAPS, Take 1 capsule by mouth at bedtime., Disp: , Rfl:    [employer]S ACETAMINOPHEN 325 MG CAPS, Take 1 capsule by mouth 2 (two) times daily. , Disp: , Rfl:    diltiazem (CARDIZEM) 30 MG tablet, Take 1 tablet every 4 hours AS NEEDED for afib heart rate over 100, Disp: 45 tablet, Rfl: 1   finasteride (PROSCAR) 5 MG tablet, Take 5 mg daily by mouth., Disp: , Rfl:    furosemide (LASIX) 40 MG tablet, TAKE 1 TABLET BY MOUTH DAILY AS NEEDED FOR FLUID (INCREASED SHORTNESS OF BREATH, LEG EDEMA, WEIGHT GAIN > 5 POUNDS IN 1 WEEK), Disp: 30 tablet, Rfl: 6   latanoprost (XALATAN) 0.005 % ophthalmic solution, Place 1 drop into both eyes at bedtime. , Disp: , Rfl:    lisinopril (PRINIVIL,ZESTRIL) 10 MG tablet, Take 10 mg by mouth at bedtime. , Disp: , Rfl:    melatonin 5 MG TABS, Take 5 mg by mouth as needed., Disp: , Rfl:    metoprolol tartrate (LOPRESSOR) 12.5 mg TABS tablet, Take  12.5 mg by mouth 2 (two) times daily., Disp: , Rfl:    mometasone (NASONEX) 50 MCG/ACT nasal spray, Place 2 sprays into both nostrils daily as needed (allergies)., Disp: , Rfl:    Multiple Vitamins-Minerals (CENTRUM SILVER PO), Take 1 tablet by mouth every morning., Disp: , Rfl:    pantoprazole (PROTONIX) 40 MG tablet, Take 1 tablet (40 mg total) by mouth daily., Disp: 90 tablet, Rfl: 3   potassium chloride (KLOR-CON) 10 MEQ CR tablet, Take 10 mEq by mouth every morning., Disp: , Rfl:    tamsulosin (FLOMAX) 0.4 MG CAPS, Take 0.4 mg by mouth 2 (two) times daily. , Disp: , Rfl:    TOLAK 4 % CREA, Apply 1 application topically daily as needed (rough place on scalp)., Disp: , Rfl:    warfarin (COUMADIN) 5 MG tablet, TAKE 1-2 TABLETS BY MOUTH DAILY AS DIRECTED BY THE [facility] CLIN[facility], Disp: 135 tablet, Rfl: 1  Observations/Objective: Patient is well-developed, well-nourished in no acute distress.  Resting comfortably at home.  Head is normocephalic, atraumatic.  No labored breathing.  Speech  is clear and coherent with logical content.  Patient is alert and oriented at baseline.    Assessment and Plan: 1. Acute bacterial sinusitis - amoxicillin-clavulanate (AUGMENTIN) 875-125 MG tablet; Take 1 tablet by mouth 2 (two) times daily.  Dispense: 20 tablet; Refill: 0 - promethazine-dextromethorphan (PROMETHAZINE-DM) 6.25-15 MG/5ML syrup; Take 5 mLs by mouth 4 (four) times daily as needed.  Dispense: 118 mL; Refill: 0 - benzonatate (TESSALON) 100 MG capsule; Take 1 capsule (100 mg total) by mouth 3 (three) times daily as needed.  Dispense: 30 capsule; Refill: 0  2. Bacterial conjunctivitis of left eye - trimethoprim-polymyxin b (POLYTRIM) ophthalmic solution; Place 1 drop into the left eye every 4 (four) hours.  Dispense: 10 mL; Refill: 0  - Worsening symptoms that have not responded to OTC medications.  - Will give Augmentin, Promethazine DM (use for nighttime cough-drowsiness precautions discussed)  and Tessalon perles (daytime cough) - Continue Mucinex - Steam and humidifier can help - Stay well hydrated and get plenty of rest.   - Suspect bacterial conjunctivitis - Polytrim prescribed - Warm compresses - Good hand hygiene  - Seek in person evaluation if no symptom improvement or if symptoms worsen   Follow Up Instructions: I discussed the assessment and treatment plan with the patient. The patient was provided an opportunity to ask questions and all were answered. The patient agreed with the plan and demonstrated an understanding of the instructions.  A copy of instructions were sent to the patient via MyChart unless otherwise noted below.    The patient was advised to call back or seek an in-person evaluation if the symptoms worsen or if the condition fails to improve as anticipated.  Time:  I spent 11 minutes with the patient via telehealth technology discussing the above problems/concerns.    Mar Daring, PA-C

## 2022-11-28 ENCOUNTER — Encounter: Payer: Self-pay | Admitting: Family Medicine

## 2022-11-29 NOTE — Telephone Encounter (Signed)
I would finish out the Augmentin and add Mucinex 1200 mg twice a day

## 2022-12-03 ENCOUNTER — Ambulatory Visit: Payer: Medicare Other

## 2022-12-10 ENCOUNTER — Ambulatory Visit: Payer: Medicare Other | Attending: Cardiovascular Disease | Admitting: *Deleted

## 2022-12-10 ENCOUNTER — Ambulatory Visit (INDEPENDENT_AMBULATORY_CARE_PROVIDER_SITE_OTHER): Payer: Medicare Other

## 2022-12-10 VITALS — Ht 71.0 in | Wt 190.0 lb

## 2022-12-10 DIAGNOSIS — Z0000 Encounter for general adult medical examination without abnormal findings: Secondary | ICD-10-CM | POA: Diagnosis not present

## 2022-12-10 DIAGNOSIS — I480 Paroxysmal atrial fibrillation: Secondary | ICD-10-CM | POA: Insufficient documentation

## 2022-12-10 DIAGNOSIS — Z5181 Encounter for therapeutic drug level monitoring: Secondary | ICD-10-CM | POA: Diagnosis not present

## 2022-12-10 LAB — POCT INR: INR: 2.7 (ref 2.0–3.0)

## 2022-12-10 NOTE — Patient Instructions (Addendum)
[name] [name] , Thank you for taking time to come for your Medicare Wellness Visit. I appreciate your ongoing commitment to your health goals. Please review the following plan we discussed and let me know if I can assist you in the future.   These are the goals we discussed:  Goals       Exercise 3x per week (30 min per time)      No current Goals (pt-stated)      patient      May go to the beach again.        This is a list of the screening recommended for you and due dates:  Health Maintenance  Topic Date Due   COVID-19 Vaccine (6 - 2023-24 season) 12/26/2022*   Zoster (Shingles) Vaccine (1 of 2) 10/08/2023*   Medicare Annual Wellness Visit  12/11/2023   DTaP/Tdap/Td vaccine (4 - Td or Tdap) 08/25/2026   Pneumonia Vaccine  Completed   Flu Shot  Completed   HPV Vaccine  Aged Out  *Topic was postponed. The date shown is not the original due date.    Advanced directives: Please bring a copy of your health care power of attorney and living will to the office to be added to your chart at your convenience.   Conditions/risks identified: None  Next appointment: Follow up in one year for your annual wellness visit.   Preventive Care 87 Years and Older, Male  Preventive care refers to lifestyle choices and visits with your health care provider that can promote health and wellness. What does preventive care include? A yearly physical exam. This is also called an annual well check. Dental exams once or twice a year. Routine eye exams. Ask your health care provider how often you should have your eyes checked. Personal lifestyle choices, including: Daily care of your teeth and gums. Regular physical activity. Eating a healthy diet. Avoiding tobacco and drug use. Limiting alcohol use. Practicing safe sex. Taking low doses of aspirin every day. Taking vitamin and mineral supplements as recommended by your health care provider. What happens during an annual well check? The services  and screenings done by your health care provider during your annual well check will depend on your age, overall health, lifestyle risk factors, and family history of disease. Counseling  Your health care provider may ask you questions about your: Alcohol use. Tobacco use. Drug use. Emotional well-being. Home and relationship well-being. Sexual activity. Eating habits. History of falls. Memory and ability to understand (cognition). Work and work Statistician. Screening  You may have the following tests or measurements: Height, weight, and BMI. Blood pressure. Lipid and cholesterol levels. These may be checked every 5 years, or more frequently if you are over 70 years old. Skin check. Lung cancer screening. You may have this screening every year starting at age 35 if you have a 30-pack-year history of smoking and currently smoke or have quit within the past 15 years. Fecal occult blood test (FOBT) of the stool. You may have this test every year starting at age 72. Flexible sigmoidoscopy or colonoscopy. You may have a sigmoidoscopy every 5 years or a colonoscopy every 10 years starting at age 52. Prostate cancer screening. Recommendations will vary depending on your family history and other risks. Hepatitis C blood test. Hepatitis B blood test. Sexually transmitted disease (STD) testing. Diabetes screening. This is done by checking your blood sugar (glucose) after you have not eaten for a while (fasting). You may have this done every 1-3  years. Abdominal aortic aneurysm (AAA) screening. You may need this if you are a current or former smoker. Osteoporosis. You may be screened starting at age 48 if you are at high risk. Talk with your health care provider about your test results, treatment options, and if necessary, the need for more tests. Vaccines  Your health care provider may recommend certain vaccines, such as: Influenza vaccine. This is recommended every year. Tetanus, diphtheria,  and acellular pertussis (Tdap, Td) vaccine. You may need a Td booster every 10 years. Zoster vaccine. You may need this after age 27. Pneumococcal 13-valent conjugate (PCV13) vaccine. One dose is recommended after age 62. Pneumococcal polysaccharide (PPSV23) vaccine. One dose is recommended after age 8. Talk to your health care provider about which screenings and vaccines you need and how often you need them. This information is not intended to replace advice given to you by your health care provider. Make sure you discuss any questions you have with your health care provider. Document Released: 11/21/2015 Document Revised: 07/14/2016 Document Reviewed: 08/26/2015 Elsevier Interactive Patient Education  2017 [place] Prevention in the Home Falls can cause injuries. They can happen to people of all ages. There are many things you can do to make your home safe and to help prevent falls. What can I do on the outside of my home? Regularly fix the edges of walkways and driveways and fix any cracks. Remove anything that might make you trip as you walk through a door, such as a raised step or threshold. Trim any bushes or trees on the path to your home. Use bright outdoor lighting. Clear any walking paths of anything that might make someone trip, such as rocks or tools. Regularly check to see if handrails are loose or broken. Make sure that both sides of any steps have handrails. Any raised decks and porches should have guardrails on the edges. Have any leaves, snow, or ice cleared regularly. Use sand or salt on walking paths during winter. Clean up any spills in your garage right away. This includes oil or grease spills. What can I do in the bathroom? Use night lights. Install grab bars by the toilet and in the tub and shower. Do not use towel bars as grab bars. Use non-skid mats or decals in the tub or shower. If you need to sit down in the shower, use a plastic, non-slip  stool. Keep the floor dry. Clean up any water that spills on the floor as soon as it happens. Remove soap buildup in the tub or shower regularly. Attach bath mats securely with double-sided non-slip rug tape. Do not have throw rugs and other things on the floor that can make you trip. What can I do in the bedroom? Use night lights. Make sure that you have a light by your bed that is easy to reach. Do not use any sheets or blankets that are too big for your bed. They should not hang down onto the floor. Have a firm chair that has side arms. You can use this for support while you get dressed. Do not have throw rugs and other things on the floor that can make you trip. What can I do in the kitchen? Clean up any spills right away. Avoid walking on wet floors. Keep items that you use a lot in easy-to-reach places. If you need to reach something above you, use a strong step stool that has a grab bar. Keep electrical cords out of the way. Do  not use floor polish or wax that makes floors slippery. If you must use wax, use non-skid floor wax. Do not have throw rugs and other things on the floor that can make you trip. What can I do with my stairs? Do not leave any items on the stairs. Make sure that there are handrails on both sides of the stairs and use them. Fix handrails that are broken or loose. Make sure that handrails are as long as the stairways. Check any carpeting to make sure that it is firmly attached to the stairs. Fix any carpet that is loose or worn. Avoid having throw rugs at the top or bottom of the stairs. If you do have throw rugs, attach them to the floor with carpet tape. Make sure that you have a light switch at the top of the stairs and the bottom of the stairs. If you do not have them, ask someone to add them for you. What else can I do to help prevent falls? Wear shoes that: Do not have high heels. Have rubber bottoms. Are comfortable and fit you well. Are closed at the  toe. Do not wear sandals. If you use a stepladder: Make sure that it is fully opened. Do not climb a closed stepladder. Make sure that both sides of the stepladder are locked into place. Ask someone to hold it for you, if possible. Clearly mark and make sure that you can see: Any grab bars or handrails. First and last steps. Where the edge of each step is. Use tools that help you move around (mobility aids) if they are needed. These include: Canes. Walkers. Scooters. Crutches. Turn on the lights when you go into a dark area. Replace any light bulbs as soon as they burn out. Set up your furniture so you have a clear path. Avoid moving your furniture around. If any of your floors are uneven, fix them. If there are any pets around you, be aware of where they are. Review your medicines with your doctor. Some medicines can make you feel dizzy. This can increase your chance of falling. Ask your doctor what other things that you can do to help prevent falls. This information is not intended to replace advice given to you by your health care provider. Make sure you discuss any questions you have with your health care provider. Document Released: 08/21/2009 Document Revised: 04/01/2016 Document Reviewed: 11/29/2014 Elsevier Interactive Patient Education  2017 [employer].

## 2022-12-10 NOTE — Patient Instructions (Signed)
Description   Continue taking warfarin 1 tablet daily. Recheck INR in 6 weeks. Call with any questions or concerns [phone] or [phone]

## 2022-12-10 NOTE — Progress Notes (Signed)
Subjective:   [name] [name] is a 89 y.o. male who presents for Medicare Annual/Subsequent preventive examination.  Review of Systems    Virtual Visit via Telephone Note  I connected with  [name] [name] on 12/10/22 at 10:45 AM EST by telephone and verified that I am speaking with the correct person using two identifiers.  Location: Patient: Home Provider: Office Persons participating in the virtual visit: patient/Nurse Health Advisor   I discussed the limitations, risks, security and privacy concerns of performing an evaluation and management service by telephone and the availability of in person appointments. The patient expressed understanding and agreed to proceed.  Interactive audio and video telecommunications were attempted between this nurse and patient, however failed, due to patient having technical difficulties OR patient did not have access to video capability.  We continued and completed visit with audio only.  Some vital signs may be absent or patient reported.   [name], LPN  Cardiac Risk Factors include: advanced age (>55mn, >>37women);male gender;hypertension     Objective:    Today's Vitals   12/10/22 1049  Weight: 190 lb (86.2 kg)  Height: '5\' 11"'$  (1.803 m)   Body mass index is 26.5 kg/m.     12/10/2022   10:57 AM 03/07/2022    8:37 AM 11/26/2021    2:45 PM 06/09/2021   10:46 PM 06/09/2021    9:53 PM 11/25/2020    3:42 PM 06/03/2017    1:21 PM  Advanced Directives  Does Patient Have a Medical Advance Directive? Yes Yes Yes Yes Yes Yes No  Type of [place]amed[place]of [place]alm [place] will  [place] will [place] will  [place] CityLiving will   Does patient want to make changes to medical advance directive?   No - Patient declined No - Patient declined  No - Patient declined   Copy of HCentral[place]ain Chart? No - copy requested  No - copy requested    No - copy requested     Current Medications (verified) Outpatient Encounter Medications as of 12/10/2022  Medication Sig   amoxicillin-clavulanate (AUGMENTIN) 875-125 MG tablet Take 1 tablet by mouth 2 (two) times daily.   atorvastatin (LIPITOR) 80 MG tablet Take 1 tablet (80 mg total) by mouth daily.   benzonatate (TESSALON) 100 MG capsule Take 1 capsule (100 mg total) by mouth 3 (three) times daily as needed.   CLARITIN 10 MG CAPS Take 1 capsule by mouth at bedtime.   [employer] ACETAMINOPHEN 325 MG CAPS Take 1 capsule by mouth 2 (two) times daily.    diltiazem (CARDIZEM) 30 MG tablet Take 1 tablet every 4 hours AS NEEDED for afib heart rate over 100   finasteride (PROSCAR) 5 MG tablet Take 5 mg daily by mouth.   furosemide (LASIX) 40 MG tablet TAKE 1 TABLET BY MOUTH DAILY AS NEEDED FOR FLUID (INCREASED SHORTNESS OF BREATH, LEG EDEMA, WEIGHT GAIN > 5 POUNDS IN 1 WEEK)   latanoprost (XALATAN) 0.005 % ophthalmic solution Place 1 drop into both eyes at bedtime.    lisinopril (PRINIVIL,ZESTRIL) 10 MG tablet Take 10 mg by mouth at bedtime.    melatonin 5 MG TABS Take 5 mg by mouth as needed.   metoprolol tartrate (LOPRESSOR) 12.5 mg TABS tablet Take 12.5 mg by mouth 2 (two) times daily.   mometasone (NASONEX) 50 MCG/ACT nasal spray Place 2 sprays into both nostrils daily as needed (allergies).   Multiple  Vitamins-Minerals (CENTRUM SILVER PO) Take 1 tablet by mouth every morning.   pantoprazole (PROTONIX) 40 MG tablet Take 1 tablet (40 mg total) by mouth daily.   potassium chloride (KLOR-CON) 10 MEQ CR tablet Take 10 mEq by mouth every morning.   promethazine-dextromethorphan (PROMETHAZINE-DM) 6.25-15 MG/5ML syrup Take 5 mLs by mouth 4 (four) times daily as needed.   tamsulosin (FLOMAX) 0.4 MG CAPS Take 0.4 mg by mouth 2 (two) times daily.    TOLAK 4 % CREA Apply 1 application topically daily as needed (rough place on scalp).   trimethoprim-polymyxin b (POLYTRIM) ophthalmic solution Place 1 drop into the  left eye every 4 (four) hours.   warfarin (COUMADIN) 5 MG tablet TAKE 1-2 TABLETS BY MOUTH DAILY AS DIRECTED BY THE COUMDADIN CLINIC   No facility-administered encounter medications on file as of 12/10/2022.    Allergies (verified) Sulfa antibiotics and Sulfamethoxazole   History: Past Medical History:  Diagnosis Date   Coronary artery disease    a. LHC on 04/06/16 which revealed 40% occl pRCA, 85% occl D1, 75% occl Ramus, 65% occl mLCx, 45% occl pLAD, 90% occl pLAD and nl LV function. s/p PCI/DES to prox LAD. Medical therapy for diag disease.     GERD (gastroesophageal reflux disease)    Glaucoma    sees Dr. [name]   Hemorrhoids    internal   HTN (hypertension)    Hx of colonic polyps    tubular adenoma    Hyperlipidemia    Kidney stone    Melanoma of cheek ([place])    Nephrolithiasis    Pancolonic diverticulosis    Paroxysmal atrial fibrillation ([place])    a. s/p ablation 12/2014: on coumadin   Past Surgical History:  Procedure Laterality Date   ATRIAL FIBRILLATION ABLATION N/A 11/12/2014   Procedure: ATRIAL FIBRILLATION ABLATION;  Surgeon: [name], MD;  Location[facility] CATH LAB;  Service: Cardiovascular;  Laterality: N/A;   BLADDER SURGERY     ruptured blood vessel in the bladder 3 weeks S/P ureter stones removed   CARDIAC CATHETERIZATION N/A 04/06/2016   Procedure: Left Heart Cath and Coronary Angiography;  Surgeon: [name], MD;  Location: [place] CV LAB;  Service: Cardiovascular;  Laterality: N/A;   CARDIAC CATHETERIZATION N/A 04/06/2016   Procedure: Coronary Stent Intervention;  Surgeon: [name], MD;  Location: [place] CV LAB;  Service: Cardiovascular;  Laterality: N/A;   CATARACT EXTRACTION W/ INTRAOCULAR LENS  IMPLANT, BILATERAL Bilateral    COLONOSCOPY  06-29-12   per Dr. [name], adenomatous polyps, repeat in 3 yrs    [place]  04/06/2016   "1 stent"   CYSTOSCOPY/RETROGRADE/URETEROSCOPY/STONE EXTRACTION WITH BASKET Left     removed 2 stones from left ureter   INGUINAL HERNIA REPAIR Right 04/1990   Dr. [name]   LAPAROSCOPIC CHOLECYSTECTOMY  [phone]   Dr. [name]   MELANOMA EXCISION Right    "@ cheek near ear"   TEE WITHOUT CARDIOVERSION N/A 11/11/2014   Procedure: TRANSESOPHAGEAL ECHOCARDIOGRAM (TEE);  Surgeon: [name], MD;  [facility] ENDOSCOPY;  Service: Cardiovascular;  Laterality: N/A;  needs INR before case   VASECTOMY     Family History  Problem Relation Age of Onset   Heart attack Father 85   Heart disease Father    Ovarian cancer Mother    Social History   Socioeconomic History   Marital status: Widowed    Spouse name: Not on file   Number of children: 2  Years of education: Not on file   Highest education level: Bachelor's degree (e.g., BA, AB, BS)  Occupational History   Occupation: Retired  Tobacco Use   Smoking status: Never   Smokeless tobacco: Never  Vaping Use   Vaping Use: Never used  Substance and Sexual Activity   Alcohol use: No    Alcohol/week: 0.0 standard drinks of alcohol   Drug use: No   Sexual activity: Not Currently  Other Topics Concern   Not on file  Social History Narrative   Not on file   Social Determinants of Health   Financial Resource Strain: Low Risk  (12/10/2022)   Overall Financial Resource Strain (CARDIA)    Difficulty of Paying Living Expenses: Not hard at all  Food Insecurity: No Food Insecurity (12/10/2022)   Hunger Vital Sign    Worried About Running Out of Food in the Last Year: Never true    [place] in the Last Year: Never true  Transportation Needs: No Transportation Needs (12/10/2022)   PRAPARE - Hydrologist (Medical): No    Lack of Transportation (Non-Medical): No  Physical Activity: Inactive (12/10/2022)   Exercise Vital Sign    Days of Exercise per Week: 0 days    Minutes of Exercise per Session: 0 min  Stress: No Stress Concern Present (12/10/2022)   [place]    Feeling of Stress : Not at all  Social Connections: Moderately Integrated (12/10/2022)   Social Connection and Isolation Panel [NHANES]    Frequency of Communication with Friends and Family: More than three times a week    Frequency of Social Gatherings with Friends and Family: More than three times a week    Attends Religious Services: More than 4 times per year    Active Member of [employer] or Organizations: Yes    Attends Archivist Meetings: More than 4 times per year    Marital Status: Widowed    Tobacco Counseling Counseling given: Not Answered   Clinical Intake:  Pre-visit preparation completed: Yes  Pain : No/denies pain     BMI - recorded: 27.75 Nutritional Status: BMI 25 -29 Overweight Nutritional Risks: None Diabetes: No  How often do you need to have someone help you when you read instructions, pamphlets, or other written materials from your doctor or pharmacy?: 1 - Never  Diabetic?  No  Interpreter Needed?: No  Information entered by :: Beverlt Acxel Dingee LPN   Activities of Daily Living    12/10/2022   10:56 AM 12/09/2022    1:13 PM  In your present state of health, do you have any difficulty performing the following activities:  Hearing? 0 0  Vision? 0 0  Difficulty concentrating or making decisions? 0 0  Walking or climbing stairs? 0 0  Dressing or bathing? 0 0  Doing errands, shopping? 0 0  Preparing Food and eating ? N N  Using the Toilet? N N  In the past six months, have you accidently leaked urine? N N  Do you have problems with loss of bowel control? N N  Managing your Medications? N N  Managing your Finances? N N  Housekeeping or managing your Housekeeping? N N    Patient Care Team: [name], MD as PCP - Cheral Bay, MD as PCP - Cardiology (Cardiology) Viona Gilmore, [facility] (Inactive) as Pharmacist (Pharmacist)  Indicate any recent Medical Services you may have received  from other than [facility] providers in the past year (date may be approximate).     Assessment:   This is a routine wellness examination for [name] [name].  Hearing/Vision screen Hearing Screening - Comments:: .Denies hearing difficulties   Vision Screening - Comments:: Wears reading glasses - up to date with routine eye exams with  Dr [name]  Dietary issues and exercise activities discussed: Current Exercise Habits: The patient does not participate in regular exercise at present, Exercise limited by: orthopedic condition(s)   Goals Addressed               This Visit's Progress     No current Goals (pt-stated)         Depression Screen    12/10/2022   10:55 AM 09/21/2022    8:26 AM 02/22/2022   11:07 AM 11/26/2021    2:37 PM 09/16/2021   11:59 AM 11/25/2020    3:44 PM 09/15/2020    8:42 AM  PHQ 2/9 Scores  PHQ - 2 Score 0 0 0 0 1 0 0  PHQ- 9 Score  0 2  2 0     Fall Risk    12/10/2022   10:57 AM 12/09/2022    1:13 PM 09/21/2022    8:26 AM 02/22/2022   11:06 AM 02/21/2022   12:24 PM  Fall Risk   Falls in the past year? 0 0 0 1 1  Number falls in past yr: 0 0 0 1 0  Injury with Fall? 0 0 0 0 0  Risk for fall due to : No Fall Risks  No Fall Risks Impaired balance/gait   Follow up Falls prevention discussed  Falls evaluation completed Falls evaluation completed     [place]:  Any stairs in or around the home? Yes  If so, are there any without handrails? No  Home free of loose throw rugs in walkways, pet beds, electrical cords, etc? Yes  Adequate lighting in your home to reduce risk of falls? Yes   ASSISTIVE DEVICES UTILIZED TO PREVENT FALLS:  Life alert? No  Use of a cane, walker or w/c? No  Grab bars in the bathroom? Yes  Shower chair or bench in shower? No  Elevated toilet seat or a handicapped toilet? Yes   TIMED UP AND GO:  Was the test performed? No . Audio Visit    Cognitive Function:        12/10/2022   10:58 AM 11/26/2021     2:42 PM  6CIT Screen  What Year? 0 points 0 points  What month? 0 points 0 points  What time? 0 points 0 points  Count back from 20 0 points 0 points  Months in reverse 0 points 0 points  Repeat phrase 0 points 0 points  Total Score 0 points 0 points    Immunizations Immunization History  Administered Date(s) Administered   Fluad Quad(high Dose 65+) 07/27/2019   Influenza Split 08/19/2011, 08/21/2012   Influenza Whole 08/14/2007, 08/01/2008, 08/11/2009, 08/17/2010   Influenza, High Dose Seasonal PF 08/25/2015, 08/25/2016, 07/29/2017, 07/25/2018, 09/02/2020, 08/03/2022   Influenza,inj,Quad PF,6+ Mos 08/22/2013, 08/23/2014   Influenza-Unspecified 08/08/2000, 09/08/2001, 08/12/2002, 08/12/2003, 08/08/2004, 10/05/2005, 08/08/2006, 09/03/2021   Moderna Covid-19 Vaccine Bivalent Booster 95yr & up 09/15/2021, 09/13/2022   Moderna Sars-Covid-2 Vaccination 11/20/2019, 12/18/2019, 09/07/2020   Pneumococcal Conjugate-13 03/07/2014   Pneumococcal Polysaccharide-23 11/08/1998, 08/08/2000, 09/08/2005, 08/13/2006   Td 06/08/2001, 07/16/2008   Tdap 08/25/2016   Zoster, Live 08/24/2011    TDAP  status: Up to date  Flu Vaccine status: Up to date  Pneumococcal vaccine status: Up to date  Covid-19 vaccine status: Completed vaccines  Qualifies for Shingles Vaccine? Yes   Zostavax completed No   Shingrix Completed?: No.    Education has been provided regarding the importance of this vaccine. Patient has been advised to call insurance company to determine out of pocket expense if they have not yet received this vaccine. Advised may also receive vaccine at local pharmacy or Health Dept. Verbalized acceptance and understanding.  Screening Tests Health Maintenance  Topic Date Due   COVID-19 Vaccine (6 - 2023-24 season) 12/26/2022 (Originally 11/08/2022)   Zoster Vaccines- Shingrix (1 of 2) 10/08/2023 (Originally 06/28/1948)   Medicare Annual Wellness (AWV)  12/11/2023   DTaP/Tdap/Td (4 - Td or  Tdap) 08/25/2026   Pneumonia Vaccine 40+ Years old  Completed   INFLUENZA VACCINE  Completed   HPV VACCINES  Aged Out    Health Maintenance  There are no preventive care reminders to display for this patient.   Colorectal cancer screening: No longer required.   Lung Cancer Screening: (Low Dose CT Chest recommended if Age 62-80 years, 30 pack-year currently smoking OR have quit w/in 15years.) does not qualify.     Additional Screening:  Hepatitis C Screening: does not qualify; Completed   Vision Screening: Recommended annual ophthalmology exams for early detection of glaucoma and other disorders of the eye. Is the patient up to date with their annual eye exam?  Yes  Who is the provider or what is the name of the office in which the patient attends annual eye exams? Dr [name] If pt is not established with a provider, would they like to be referred to a provider to establish care? No .   Dental Screening: Recommended annual dental exams for proper oral hygiene  Community Resource Referral / Chronic Care Management:  CRR required this visit?  No   CCM required this visit?  No      Plan:     I have personally reviewed and noted the following in the patient's chart:   Medical and social history Use of alcohol, tobacco or illicit drugs  Current medications and supplements including opioid prescriptions. Patient is not currently taking opioid prescriptions. Functional ability and status Nutritional status Physical activity Advanced directives List of other physicians Hospitalizations, surgeries, and ER visits in previous 12 months Vitals Screenings to include cognitive, depression, and falls Referrals and appointments  In addition, I have reviewed and discussed with patient certain preventive protocols, quality metrics, and best practice recommendations. A written personalized care plan for preventive services as well as general preventive health recommendations were  provided to patient.     [name], LPN   11/13/1094   Nurse Notes: None

## 2022-12-26 ENCOUNTER — Encounter (HOSPITAL_COMMUNITY): Payer: Self-pay

## 2022-12-26 ENCOUNTER — Emergency Department (HOSPITAL_COMMUNITY)
Admission: EM | Admit: 2022-12-26 | Discharge: 2022-12-26 | Disposition: A | Discharge status: Home / Self Care | Payer: Medicare Other | Attending: Emergency Medicine | Admitting: Emergency Medicine

## 2022-12-26 ENCOUNTER — Other Ambulatory Visit: Payer: Self-pay

## 2022-12-26 ENCOUNTER — Emergency Department (HOSPITAL_COMMUNITY): Payer: Medicare Other

## 2022-12-26 DIAGNOSIS — R531 Weakness: Secondary | ICD-10-CM | POA: Diagnosis not present

## 2022-12-26 DIAGNOSIS — R55 Syncope and collapse: Secondary | ICD-10-CM | POA: Diagnosis not present

## 2022-12-26 DIAGNOSIS — G319 Degenerative disease of nervous system, unspecified: Secondary | ICD-10-CM | POA: Diagnosis not present

## 2022-12-26 DIAGNOSIS — I10 Essential (primary) hypertension: Secondary | ICD-10-CM | POA: Diagnosis not present

## 2022-12-26 DIAGNOSIS — I2510 Atherosclerotic heart disease of native coronary artery without angina pectoris: Secondary | ICD-10-CM | POA: Diagnosis not present

## 2022-12-26 DIAGNOSIS — Z7901 Long term (current) use of anticoagulants: Secondary | ICD-10-CM | POA: Insufficient documentation

## 2022-12-26 DIAGNOSIS — R42 Dizziness and giddiness: Secondary | ICD-10-CM | POA: Insufficient documentation

## 2022-12-26 LAB — COMPREHENSIVE METABOLIC PANEL
ALT: 28 U/L (ref 0–44)
AST: 29 U/L (ref 15–41)
Albumin: 3.5 g/dL (ref 3.5–5.0)
Alkaline Phosphatase: 63 U/L (ref 38–126)
Anion gap: 11 (ref 5–15)
BUN: 19 mg/dL (ref 8–23)
CO2: 26 mmol/L (ref 22–32)
Calcium: 9.1 mg/dL (ref 8.9–10.3)
Chloride: 103 mmol/L (ref 98–111)
Creatinine, Ser: 1.03 mg/dL (ref 0.61–1.24)
GFR, Estimated: >60 mL/min (ref >60)
Glucose, Bld: 109 mg/dL — ABNORMAL HIGH (ref 70–99)
Potassium: 4.3 mmol/L (ref 3.5–5.1)
Sodium: 140 mmol/L (ref 135–145)
Total Bilirubin: 0.7 mg/dL (ref 0.3–1.2)
Total Protein: 6 g/dL — ABNORMAL LOW (ref 6.5–8.1)

## 2022-12-26 LAB — CBC WITH DIFFERENTIAL/PLATELET
Abs Immature Granulocytes: 0.03 10*3/uL (ref 0.00–0.07)
Basophils Absolute: 0 10*3/uL (ref 0.0–0.1)
Basophils Relative: 0 %
Eosinophils Absolute: 0.2 10*3/uL (ref 0.0–0.5)
Eosinophils Relative: 2 %
HCT: 37 % — ABNORMAL LOW (ref 39.0–52.0)
Hemoglobin: 12.3 g/dL — ABNORMAL LOW (ref 13.0–17.0)
Immature Granulocytes: 0 %
Lymphocytes Relative: 18 %
Lymphs Abs: 1.8 10*3/uL (ref 0.7–4.0)
MCH: 32.4 pg (ref 26.0–34.0)
MCHC: 33.2 g/dL (ref 30.0–36.0)
MCV: 97.4 fL (ref 80.0–100.0)
Monocytes Absolute: 1.1 10*3/uL — ABNORMAL HIGH (ref 0.1–1.0)
Monocytes Relative: 10 %
Neutro Abs: 7.3 10*3/uL (ref 1.7–7.7)
Neutrophils Relative %: 70 %
Platelets: 157 10*3/uL (ref 150–400)
RBC: 3.8 MIL/uL — ABNORMAL LOW (ref 4.22–5.81)
RDW: 13.7 % (ref 11.5–15.5)
WBC: 10.4 10*3/uL (ref 4.0–10.5)
nRBC: 0 % (ref 0.0–0.2)

## 2022-12-26 LAB — PROTIME-INR
INR: 2.9 — ABNORMAL HIGH (ref 0.8–1.2)
Prothrombin Time: 30.2 seconds — ABNORMAL HIGH (ref 11.4–15.2)

## 2022-12-26 LAB — TROPONIN I (HIGH SENSITIVITY): Troponin I (High Sensitivity): 5 ng/L (ref <18)

## 2022-12-26 MED ORDER — LACTATED RINGERS IV BOLUS
500.0000 mL | Freq: Once | INTRAVENOUS | Status: AC
  Administered 2022-12-26: 500 mL via INTRAVENOUS

## 2022-12-26 NOTE — ED Notes (Addendum)
Pt was able to do orthostatic vitals signs w/out getting dizzy or light headed.  Pt was able to use urinal w/out assistance by standing beside bed  Output 100  Pt has gotten back in bed and call light is within reach

## 2022-12-26 NOTE — ED Triage Notes (Signed)
Patient BIB [facility] from home  where he lives alone for evaluation of dizziness that has been occurring intermittently over the last month related to issues with afib. NSR now with rate of 70, occasional PVC's. Patient alert, oriented ,and in no apparent distress at this time.  EMS Vitals BP 160/90 CBG 208 RR 18 SpO2 98% on room air

## 2022-12-26 NOTE — ED Notes (Signed)
Pt was informed MRI has a few people ahead of him. Pt stated he doesn't feel he needs an MRI d/t he recently had one that was normal. Pt states he feels fine. RN notified EDP to speak with pt about if the MRI is necessary.

## 2022-12-26 NOTE — ED Notes (Signed)
Patient transported to MRI 

## 2022-12-26 NOTE — ED Provider Notes (Addendum)
[facility] Provider Note   CSN: [id number] Arrival date & time: 12/26/22  0935     History  Chief Complaint  Patient presents with   Dizziness    [name] [name] is a 89 y.o. male.  HPI 89 year old male with a history of paroxysmal A-fib, hypertension, CAD and glaucoma presents with lightheadedness.  Feels overall weak.  Symptoms originally started on 2/14.  He felt himself go into A-fib for several hours.  He then seemingly blacked out or nearly blacked out.  He had recurrent A-fib the next day.  Ever since these episodes he has felt generally weak and foggy headed.  He is able to ambulate though with a cane.  He had difficulty ambulating ever since he had glaucoma medicines adjusted several months ago but this fogginess is different than that.  He states he does have an abnormal feeling in his chest that comes and goes but he does not describe it as pain.  He has not been in A-fib for several days.  He reports compliance with his meds and no recent other change in meds.  No fevers.  He had a cough for over a month with multiple negative COVID test.  Felt like his throat was sore and had some white spots in it the other day.  No headaches or focal weakness.  He has chronic leg swelling that is unchanged for him. The lightheadedness does not really seem positional. He's been ambulatory at this baseline and doesn't feel off balance compared to normal.  Home Medications Prior to Admission medications   Medication Sig Start Date End Date Taking? Authorizing Provider  amoxicillin-clavulanate (AUGMENTIN) 875-125 MG tablet Take 1 tablet by mouth 2 (two) times daily. 11/26/22   Mar Daring, PA-C  atorvastatin (LIPITOR) 80 MG tablet Take 1 tablet (80 mg total) by mouth daily. 04/19/16   Eileen Stanford, PA-C  benzonatate (TESSALON) 100 MG capsule Take 1 capsule (100 mg total) by mouth 3 (three) times daily as needed. 11/26/22   Mar Daring, PA-C  CLARITIN 10 MG CAPS Take 1 capsule by mouth at bedtime. 08/08/19   [provider]  [facility] ACETAMINOPHEN 325 MG CAPS Take 1 capsule by mouth 2 (two) times daily.  08/08/19   [provider]  diltiazem (CARDIZEM) 30 MG tablet Take 1 tablet every 4 hours AS NEEDED for afib heart rate over 100 08/08/17   Sherran Needs, NP  finasteride (PROSCAR) 5 MG tablet Take 5 mg daily by mouth.    [provider]  furosemide (LASIX) 40 MG tablet TAKE 1 TABLET BY MOUTH DAILY AS NEEDED FOR FLUID (INCREASED SHORTNESS OF BREATH, LEG EDEMA, WEIGHT GAIN > 5 POUNDS IN 1 WEEK) 06/18/22   Nahser, Wonda Cheng, MD  latanoprost (XALATAN) 0.005 % ophthalmic solution Place 1 drop into both eyes at bedtime.     [provider]  lisinopril (PRINIVIL,ZESTRIL) 10 MG tablet Take 10 mg by mouth at bedtime.     [provider]  melatonin 5 MG TABS Take 5 mg by mouth as needed.    [provider]  metoprolol tartrate (LOPRESSOR) 12.5 mg TABS tablet Take 12.5 mg by mouth 2 (two) times daily.    [provider]  mometasone (NASONEX) 50 MCG/ACT nasal spray Place 2 sprays into both nostrils daily as needed (allergies).    [provider]  Multiple Vitamins-Minerals (CENTRUM SILVER PO) Take 1 tablet by mouth every morning.  [provider]  pantoprazole (PROTONIX) 40 MG tablet Take 1 tablet (40 mg total) by mouth daily. 04/19/16   Eileen Stanford, PA-C  potassium chloride (KLOR-CON) 10 MEQ CR tablet Take 10 mEq by mouth every morning.    [provider]  promethazine-dextromethorphan (PROMETHAZINE-DM) 6.25-15 MG/5ML syrup Take 5 mLs by mouth 4 (four) times daily as needed. 11/26/22   Mar Daring, PA-C  tamsulosin (FLOMAX) 0.4 MG CAPS Take 0.4 mg by mouth 2 (two) times daily.     [provider]  TOLAK 4 % CREA Apply 1 application topically daily as needed (rough place on scalp). 08/08/19   [provider]   trimethoprim-polymyxin b (POLYTRIM) ophthalmic solution Place 1 drop into the left eye every 4 (four) hours. 11/26/22   Mar Daring, PA-C  warfarin (COUMADIN) 5 MG tablet TAKE 1-2 TABLETS BY MOUTH DAILY AS DIRECTED BY THE [facility] 06/17/22   Nahser, Wonda Cheng, MD      Allergies    Sulfa antibiotics and Sulfamethoxazole    Review of Systems   Review of Systems  Constitutional:  Negative for fever.  Respiratory:  Positive for cough. Negative for shortness of breath.   Cardiovascular:  Positive for chest pain.  Gastrointestinal:  Negative for abdominal pain, diarrhea and vomiting.  Genitourinary:  Negative for dysuria.  Neurological:  Positive for syncope and light-headedness.    Physical Exam Updated Vital Signs BP (!) 131/59   Pulse 63   Temp 97.9 F (36.6 C) (Oral)   Resp 14   Ht 5' 11"$  (1.803 m)   Wt 86.2 kg   SpO2 97%   BMI 26.50 kg/m  Physical Exam Vitals and nursing note reviewed.  Constitutional:      General: He is not in acute distress.    Appearance: He is well-developed. He is not ill-appearing or diaphoretic.  HENT:     Head: Normocephalic and atraumatic.     Mouth/Throat:     Pharynx: No oropharyngeal exudate.  Cardiovascular:     Rate and Rhythm: Normal rate and regular rhythm.     Heart sounds: Normal heart sounds.  Pulmonary:     Effort: Pulmonary effort is normal.     Breath sounds: Normal breath sounds.  Abdominal:     Palpations: Abdomen is soft.     Tenderness: There is no abdominal tenderness.  Musculoskeletal:     Comments: Chronic appearing bilateral lower extremity edema. No significant pitting  Skin:    General: Skin is warm and dry.  Neurological:     Mental Status: He is alert.     Comments: CN 3-12 grossly intact. 5/5 strength in all 4 extremities. Grossly normal sensation. Normal finger to nose.      ED Results / Procedures / Treatments   Labs (all labs ordered are listed, but only abnormal results are  displayed) Labs Reviewed  COMPREHENSIVE METABOLIC PANEL - Abnormal; Notable for the following components:      Result Value   Glucose, Bld 109 (*)    Total Protein 6.0 (*)    All other components within normal limits  CBC WITH DIFFERENTIAL/PLATELET - Abnormal; Notable for the following components:   RBC 3.80 (*)    Hemoglobin 12.3 (*)    HCT 37.0 (*)    Monocytes Absolute 1.1 (*)    All other components within normal limits  PROTIME-INR - Abnormal; Notable for the following components:   Prothrombin Time 30.2 (*)    INR 2.9 (*)  All other components within normal limits  TROPONIN I (HIGH SENSITIVITY)    EKG EKG Interpretation  Date/Time:  Sunday December 26 2022 09:42:43 EST Ventricular Rate:  63 PR Interval:  162 QRS Duration: 97 QT Interval:  402 QTC Calculation: 412 R Axis:   24 Text Interpretation: Sinus rhythm Low voltage, precordial leads no acute ST/T changes similar to April 2023 Confirmed by Sherwood Gambler [phone]) on 12/26/2022 10:04:42 AM  Radiology MR BRAIN WO CONTRAST  Result Date: 12/26/2022 CLINICAL DATA:  Provided history: Neuro deficit, acute, stroke suspected. EXAM: MRI HEAD WITHOUT CONTRAST TECHNIQUE: Multiplanar, multiecho pulse sequences of the brain and surrounding structures were obtained without intravenous contrast. COMPARISON:  Brain MRI 03/07/2022 FINDINGS: Brain: Moderate cerebral atrophy.  Mild cerebellar atrophy. 7 mm meningioma along the anteroinferior aspect of the falx, unchanged in size from the prior brain MRI of 03/07/2022 (series 7, image 14). Multifocal T2 FLAIR hyperintense signal abnormality within the cerebral white matter, nonspecific but compatible with minimal chronic small vessel ischemic disease. There is no acute infarct. No chronic intracranial blood products. No extra-axial fluid collection. No midline shift. Vascular: Maintained flow voids within the proximal large arterial vessels. Skull and upper cervical spine: No suspicious  marrow lesion. Sinuses/Orbits: No mass or acute finding within the imaged orbits. Prior bilateral ocular lens replacement. Minimal mucosal thickening scattered within the paranasal sinuses. IMPRESSION: 1. No evidence of acute or recent subacute infarction. 2. 7 mm meningioma along the anteroinferior aspect of the falx, unchanged in size from the prior brain MRI of 03/07/2022. 3. Minimal chronic small vessel ischemic changes within the cerebral white matter. 4. Moderate cerebral atrophy. 5. Mild cerebellar atrophy Electronically Signed   By: Kellie Simmering D.O.   On: 12/26/2022 15:33   DG Chest 2 View  Result Date: 12/26/2022 CLINICAL DATA:  Weakness. EXAM: CHEST - 2 VIEW COMPARISON:  06/09/2021 FINDINGS: The cardiac silhouette, mediastinal and hilar contours are normal. The lungs are clear of an acute process. No infiltrates or effusions. No pulmonary lesions or pneumothorax. The bony thorax is intact. IMPRESSION: No acute cardiopulmonary findings. Electronically Signed   By: Marijo Sanes M.D.   On: 12/26/2022 10:45    Procedures Procedures    Medications Ordered in ED Medications  lactated ringers bolus 500 mL (0 mLs Intravenous Stopped 12/26/22 1218)    ED Course/ Medical Decision Making/ A&P Clinical Course as of 12/26/22 1553  Sun Dec 26, 2022  1223 Workup is unremarkable compared to baseline. He still feels lightheaded. No headache. No focal deficits. He states he's been taking "baby steps" for walking. While he says it's "lightheadedness", will get MRI given his stroke risk factors. Given no headache and poor posterior fossa sensitivity I think we can skip head CT (has also had days of symptoms) [SG]    Clinical Course User Index [SG] Sherwood Gambler, MD                             Medical Decision Making Amount and/or Complexity of Data Reviewed Labs: ordered.    Details: Chronic anemia, normal WBC, no significant electrolyte disturbance.  Therapeutic INR.  Normal  troponin. Radiology: ordered and independent interpretation performed.    Details: MRI without stroke ECG/medicine tests: independent interpretation performed.    Details: Sinus rhythm without ischemia   Patient presents with a general feeling of lightheadedness and feeling off.  He had no trouble walking at home.  His workup here is pretty  unremarkable including an overall unremarkable EKG, stable but mild anemia, normal troponin after days of symptoms and no chest pain, and a benign MRI.  He feels comfortable going home and I doubt occult stroke, MI, etc.  I discussed I cannot rule out all cardiac disease and since he does have CAD he should follow-up with cards and I have referred him back to cardiology.  Otherwise he appears stable for discharge.  Will give return precautions.        Final Clinical Impression(s) / ED Diagnoses Final diagnoses:  Lightheadedness    Rx / DC Orders ED Discharge Orders          Ordered    Ambulatory referral to Cardiology       Comments: If you have not heard from the Cardiology office within the next 72 hours please call [phone].   12/26/22 1552              Sherwood Gambler, MD 12/26/22 [place], MD 12/26/22 [phone]

## 2022-12-26 NOTE — Discharge Instructions (Addendum)
If you develop chest pain, shortness of breath, headache, difficulty walking, or any other new/concerning symptoms then return to the ER or call 911.

## 2022-12-26 NOTE — ED Notes (Signed)
RN assisted pt getting out bed to use urinal at bedside

## 2022-12-27 ENCOUNTER — Encounter: Payer: Self-pay | Admitting: Cardiology

## 2022-12-27 ENCOUNTER — Encounter: Payer: Self-pay | Admitting: Family Medicine

## 2022-12-27 NOTE — Telephone Encounter (Signed)
Noted  

## 2023-01-03 ENCOUNTER — Telehealth: Payer: Self-pay

## 2023-01-03 NOTE — Telephone Encounter (Signed)
     Patient  visit on 12/26/2022  at [facility] was for dizziness.  Have you been able to follow up with your primary care physician?Patient has appointment with Cardiologist 01/07/23.  The patient was or was not able to obtain any needed medicine or equipment. No medication prescribed.  Are there diet recommendations that you are having difficulty following? No  Patient expresses understanding of discharge instructions and education provided has no other needs at this time. Yes   [place] Resource Care Guide   [email or web address]  ?? [phone]   Website: [email or web address]  [email or web address]

## 2023-01-06 DIAGNOSIS — R42 Dizziness and giddiness: Secondary | ICD-10-CM | POA: Insufficient documentation

## 2023-01-06 NOTE — Progress Notes (Signed)
Cardiology Office Note   Date:  01/07/2023   ID:  [name] [name], DOB 1929/02/10, MRN [id number]   PCP:  [name] Morale, MD  Cardiologist:   [name], MD    Chief Complaint  Patient presents with   Atrial Fibrillation   Dizziness      History of Present Illness: [name] [name] is a 89 y.o. male who who presents for follow up of PAF.  He was in the ED on late December 2018 with PAF. He saw Dr [name] [name] and considered ablation vs amiodarone.  He decided to pursue medical management.  He was in the hospital with PAF in August 2022.  This episode lasted longer than usual.  He did convert subsequently spontaneously.   He returns for follow up.   He was in the ED late last month with lightheadedness.  I reviewed these records for this visit.    He had no acute findings on MRI.  There were no arrhythmias.  He said he been in atrial fibrillation a couple of times because he thinks he had a lot of chart around Valentine's Day.  He was probably in it twice over couple days for 5 to 6 hours.  He self medicated with some as needed Cardizem and metoprolol.  By the time he was in the emergency room here 4 days later he was not having any palpitations and had normal evaluation as above.  He is otherwise felt well.   Past Medical History:  Diagnosis Date   Coronary artery disease    a. LHC on 04/06/16 which revealed 40% occl pRCA, 85% occl D1, 75% occl Ramus, 65% occl mLCx, 45% occl pLAD, 90% occl pLAD and nl LV function. s/p PCI/DES to prox LAD. Medical therapy for diag disease.     GERD (gastroesophageal reflux disease)    Glaucoma    sees Dr. Gershon [name]   Hemorrhoids    internal   HTN (hypertension)    Hx of colonic polyps    tubular adenoma    Hyperlipidemia    Kidney stone    Melanoma of cheek ([place])    Nephrolithiasis    Pancolonic diverticulosis    Paroxysmal atrial fibrillation ([place])    a. s/p ablation 12/2014: on coumadin    Past Surgical History:  Procedure Laterality Date    ATRIAL FIBRILLATION ABLATION N/A 11/12/2014   Procedure: ATRIAL FIBRILLATION ABLATION;  Surgeon: [name] Grayer, MD;  [facility] CATH LAB;  Service: Cardiovascular;  Laterality: N/A;   BLADDER SURGERY     ruptured blood vessel in the bladder 3 weeks S/P ureter stones removed   CARDIAC CATHETERIZATION N/A 04/06/2016   Procedure: Left Heart Cath and Coronary Angiography;  Surgeon: [name] Crome, MD;  Location: [facility] CV LAB;  Service: Cardiovascular;  Laterality: N/A;   CARDIAC CATHETERIZATION N/A 04/06/2016   Procedure: Coronary Stent Intervention;  Surgeon: [name] Crome, MD;  Location: [facility] CV LAB;  Service: Cardiovascular;  Laterality: N/A;   CATARACT EXTRACTION W/ INTRAOCULAR LENS  IMPLANT, BILATERAL Bilateral    COLONOSCOPY  06-29-12   per Dr. Carlean [name], adenomatous polyps, repeat in 3 yrs    CORONARY ANGIOPLASTY WITH STENT PLACEMENT  04/06/2016   "1 stent"   CYSTOSCOPY/RETROGRADE/URETEROSCOPY/STONE EXTRACTION WITH BASKET Left    removed 2 stones from left ureter   INGUINAL HERNIA REPAIR Right 04/1990   Dr. Lennie [name]   LAPAROSCOPIC CHOLECYSTECTOMY  [phone]   Dr. Lennie [name]   MELANOMA EXCISION Right    "@  cheek near ear"   TEE WITHOUT CARDIOVERSION N/A 11/11/2014   Procedure: TRANSESOPHAGEAL ECHOCARDIOGRAM (TEE);  Surgeon: [name] Margarita, MD;  Location: [facility] ENDOSCOPY;  Service: Cardiovascular;  Laterality: N/A;  needs INR before case   VASECTOMY       Current Outpatient Medications  Medication Sig Dispense Refill   atorvastatin (LIPITOR) 80 MG tablet Take 1 tablet (80 mg total) by mouth daily. 90 tablet 3   CLARITIN 10 MG CAPS Take 1 capsule by mouth at bedtime.     [facility]S ACETAMINOPHEN 325 MG CAPS Take 1 capsule by mouth 2 (two) times daily.      diltiazem (CARDIZEM) 30 MG tablet Take 1 tablet every 4 hours AS NEEDED for afib heart rate over 100 45 tablet 1   finasteride (PROSCAR) 5 MG tablet Take 5 mg daily by mouth.     furosemide (LASIX) 40 MG tablet TAKE 1 TABLET BY MOUTH DAILY  AS NEEDED FOR FLUID (INCREASED SHORTNESS OF BREATH, LEG EDEMA, WEIGHT GAIN > 5 POUNDS IN 1 WEEK) 30 tablet 6   latanoprost (XALATAN) 0.005 % ophthalmic solution Place 1 drop into both eyes at bedtime.      lisinopril (PRINIVIL,ZESTRIL) 10 MG tablet Take 10 mg by mouth at bedtime.      melatonin 5 MG TABS Take 5 mg by mouth as needed.     metoprolol tartrate (LOPRESSOR) 12.5 mg TABS tablet Take 12.5 mg by mouth 2 (two) times daily.     mometasone (NASONEX) 50 MCG/ACT nasal spray Place 2 sprays into both nostrils daily as needed (allergies).     Multiple Vitamins-Minerals (CENTRUM SILVER PO) Take 1 tablet by mouth every morning.     pantoprazole (PROTONIX) 40 MG tablet Take 1 tablet (40 mg total) by mouth daily. 90 tablet 3   potassium chloride (KLOR-CON) 10 MEQ CR tablet Take 10 mEq by mouth every morning.     promethazine-dextromethorphan (PROMETHAZINE-DM) 6.25-15 MG/5ML syrup Take 5 mLs by mouth 4 (four) times daily as needed. 118 mL 0   tamsulosin (FLOMAX) 0.4 MG CAPS Take 0.4 mg by mouth 2 (two) times daily.      TOLAK 4 % CREA Apply 1 application topically daily as needed (rough place on scalp).     trimethoprim-polymyxin b (POLYTRIM) ophthalmic solution Place 1 drop into the left eye every 4 (four) hours. 10 mL 0   warfarin (COUMADIN) 5 MG tablet TAKE 1-2 TABLETS BY MOUTH DAILY AS DIRECTED BY THE [facility] 135 tablet 1   No current facility-administered medications for this visit.    Allergies:   Sulfa antibiotics and Sulfamethoxazole    ROS:  Please see the history of present illness.   Otherwise, review of systems are positive for none.   All other systems are reviewed and negative.    PHYSICAL EXAM: VS:  BP 136/60   Pulse (!) 58   Ht '5\' 11"'$  (1.803 m)   Wt 200 lb 9.6 oz (91 kg)   SpO2 97%   BMI 27.98 kg/m  , BMI Body mass index is 27.98 kg/m. GENERAL:  Well appearing NECK:  No jugular venous distention, waveform within normal limits, carotid upstroke brisk and  symmetric, no bruits, no thyromegaly LUNGS:  Clear to auscultation bilaterally CHEST:  Unremarkable HEART:  PMI not displaced or sustained,S1 and S2 within normal limits, no S3, no S4, no clicks, no rubs, no murmurs ABD:  Flat, positive bowel sounds normal in frequency in pitch, no bruits, no rebound, no guarding, no midline pulsatile  mass, no hepatomegaly, no splenomegaly EXT:  2 plus pulses throughout, moderate bilateral edema, no cyanosis no clubbing   EKG:  EKG is not ordered today. 12/26/2022 sinus rhythm, rate 63, axis within normal limits, intervals within normal limits, no acute ST-T wave changes.   Recent Labs: 09/21/2022: TSH 1.72 12/26/2022: ALT 28; BUN 19; Creatinine, Ser 1.03; Hemoglobin 12.3; Platelets 157; Potassium 4.3; Sodium 140    Lipid Panel    Component Value Date/Time   CHOL 131 09/21/2022 0911   TRIG 115.0 09/21/2022 0911   HDL 46.10 09/21/2022 0911   CHOLHDL 3 09/21/2022 0911   VLDL 23.0 09/21/2022 0911   LDLCALC 62 09/21/2022 0911   LDLCALC 74 09/15/2020 0934      Wt Readings from Last 3 Encounters:  01/07/23 200 lb 9.6 oz (91 kg)  12/26/22 190 lb (86.2 kg)  12/10/22 190 lb (86.2 kg)      Other studies Reviewed: Additional studies/ records that were reviewed today include: ED records Review of the above records demonstrates:  Please see elsewhere in the note.     ASSESSMENT AND PLAN:   DIZZINESS: He is no longer having dizziness.  This may have been related to little dehydration and he was given some fluids in the ED.  No further workup.   PAF:     Mr. KYLLIAN ERRANTE has a CHA2DS2 - VASc score of 4.    His atrial fibs is relatively infrequent still and he has not had to change therapy.  He does not want to change to DOAC.   CAD:    He denies any chest discomfort.  No change in therapy.   HTN:  BP is controlled.  No change in therapy.   CHRONIC DISATOLIC HF:   He has chronic lower extremity swelling.  Otherwise no change in  therapy.   Current medicines are reviewed at length with the patient today.  The patient does not have concerns regarding medicines.  The following changes have been made: None  Labs/ tests ordered today include: None  No orders of the defined types were placed in this encounter.   Disposition:   FU with me in 6  months   Signed, [name], MD  01/07/2023 8:43 AM    [facility]

## 2023-01-07 ENCOUNTER — Ambulatory Visit: Payer: Medicare Other | Attending: Cardiology | Admitting: Cardiology

## 2023-01-07 ENCOUNTER — Encounter: Payer: Self-pay | Admitting: Cardiology

## 2023-01-07 VITALS — BP 136/60 | HR 58 | Ht 71.0 in | Wt 200.6 lb

## 2023-01-07 DIAGNOSIS — I5032 Chronic diastolic (congestive) heart failure: Secondary | ICD-10-CM | POA: Insufficient documentation

## 2023-01-07 DIAGNOSIS — R42 Dizziness and giddiness: Secondary | ICD-10-CM

## 2023-01-07 DIAGNOSIS — I2510 Atherosclerotic heart disease of native coronary artery without angina pectoris: Secondary | ICD-10-CM | POA: Diagnosis not present

## 2023-01-07 DIAGNOSIS — I480 Paroxysmal atrial fibrillation: Secondary | ICD-10-CM | POA: Insufficient documentation

## 2023-01-07 DIAGNOSIS — I10 Essential (primary) hypertension: Secondary | ICD-10-CM | POA: Diagnosis not present

## 2023-01-07 NOTE — Patient Instructions (Signed)
  Follow-Up: At [facility], you and your health needs are our priority.  As part of our continuing mission to provide you with exceptional heart care, we have created designated Provider Care Teams.  These Care Teams include your primary Cardiologist (physician) and Advanced Practice Providers (APPs -  Physician Assistants and Nurse Practitioners) who all work together to provide you with the care you need, when you need it.  We recommend signing up for the patient portal called "MyChart".  Sign up information is provided on this After Visit Summary.  MyChart is used to connect with patients for Virtual Visits (Telemedicine).  Patients are able to view lab/test results, encounter notes, upcoming appointments, etc.  Non-urgent messages can be sent to your provider as well.   To learn more about what you can do with MyChart, go to [email or web address].    Your next appointment:   6 month(s)  Provider:   James Hochrein, MD      

## 2023-01-10 ENCOUNTER — Telehealth: Payer: Self-pay

## 2023-01-10 NOTE — Progress Notes (Signed)
Patient ID: [name] [name], male   DOB: 1928-12-10, 89 y.o.   MRN: [id number]  Care Management & Coordination Services Pharmacy Team  Reason for Encounter: Move up visit with pharmacist per [place].     What concerns do you have about your medications? Patient reports none  Counseled patient on: Access to carecoordination team for any cost, medication or pharmacy concerns.   Chart Updates:  Recent office visits:  12/10/22 [name], LPN - Patient presented for Medicare Annual wellness exam. No medication changes.   09/21/22 [name], MD - Patient presented for Essential hypertension and other concerns. No medication changes.   Recent consult visits:  01/07/23 [name], MD (Cardiology) - Patient presented for Dizziness and other concerns. Stopped Amoxicillin. Stopped Benzonatate.   12/10/22 Patient presented for Anti Coag visit reading was 2.7.  11/26/22 Mar Daring, PA-C - Patient presented for Acute bacterial sinusitis and other concerns. Prescribed Amoxicillin. Prescribed Benzonatate. Prescribed Polytrim. Prescribed Promethazine.  10/25/22 Hyman Hopes, MD - Patient presented for glaucoma evaluation. No medication changes.   Hospital visits:  Medication Reconciliation was completed by comparing discharge summary, patient's EMR and Pharmacy list, and upon discussion with patient.  Patient presented to [facility] ED at [facility] on 12/26/22 due to dizziness. Patient was present for 6 hours.  New?Medications Started at [facility] Discharge:?? -started  none  Medication Changes at Hospital Discharge: -Changed  none  Medications Discontinued at Hospital Discharge: -Stopped  none  Medications that remain the same after Hospital Discharge:??  -All other medications will remain the same.    Medications: Outpatient Encounter Medications as of 01/10/2023  Medication Sig   atorvastatin (LIPITOR) 80 MG tablet Take 1 tablet (80 mg total) by mouth  daily.   CLARITIN 10 MG CAPS Take 1 capsule by mouth at bedtime.   [facility] ACETAMINOPHEN 325 MG CAPS Take 1 capsule by mouth 2 (two) times daily.    diltiazem (CARDIZEM) 30 MG tablet Take 1 tablet every 4 hours AS NEEDED for afib heart rate over 100   finasteride (PROSCAR) 5 MG tablet Take 5 mg daily by mouth.   furosemide (LASIX) 40 MG tablet TAKE 1 TABLET BY MOUTH DAILY AS NEEDED FOR FLUID (INCREASED SHORTNESS OF BREATH, LEG EDEMA, WEIGHT GAIN > 5 POUNDS IN 1 WEEK)   latanoprost (XALATAN) 0.005 % ophthalmic solution Place 1 drop into both eyes at bedtime.    lisinopril (PRINIVIL,ZESTRIL) 10 MG tablet Take 10 mg by mouth at bedtime.    melatonin 5 MG TABS Take 5 mg by mouth as needed.   metoprolol tartrate (LOPRESSOR) 12.5 mg TABS tablet Take 12.5 mg by mouth 2 (two) times daily.   mometasone (NASONEX) 50 MCG/ACT nasal spray Place 2 sprays into both nostrils daily as needed (allergies).   Multiple Vitamins-Minerals (CENTRUM SILVER PO) Take 1 tablet by mouth every morning.   pantoprazole (PROTONIX) 40 MG tablet Take 1 tablet (40 mg total) by mouth daily.   potassium chloride (KLOR-CON) 10 MEQ CR tablet Take 10 mEq by mouth every morning.   promethazine-dextromethorphan (PROMETHAZINE-DM) 6.25-15 MG/5ML syrup Take 5 mLs by mouth 4 (four) times daily as needed.   tamsulosin (FLOMAX) 0.4 MG CAPS Take 0.4 mg by mouth 2 (two) times daily.    TOLAK 4 % CREA Apply 1 application topically daily as needed (rough place on scalp).   trimethoprim-polymyxin b (POLYTRIM) ophthalmic solution Place 1 drop into the left eye every 4 (four) hours.   warfarin (COUMADIN) 5 MG  tablet TAKE 1-2 TABLETS BY MOUTH DAILY AS DIRECTED BY THE COUMDADIN CLINIC   No facility-administered encounter medications on file as of 01/10/2023.    Recent vitals BP Readings from Last 3 Encounters:  01/07/23 136/60  12/26/22 (!) 131/59  09/21/22 110/60   Pulse Readings from Last 3 Encounters:  01/07/23 (!) 58  12/26/22 63  09/21/22 63    Wt Readings from Last 3 Encounters:  01/07/23 200 lb 9.6 oz (91 kg)  12/26/22 190 lb (86.2 kg)  12/10/22 190 lb (86.2 kg)   BMI Readings from Last 3 Encounters:  01/07/23 27.98 kg/m  12/26/22 26.50 kg/m  12/10/22 26.50 kg/m    Recent lab results    Component Value Date/Time   NA 140 12/26/2022 1057   K 4.3 12/26/2022 1057   CL 103 12/26/2022 1057   CO2 26 12/26/2022 1057   GLUCOSE 109 (H) 12/26/2022 1057   BUN 19 12/26/2022 1057   CREATININE 1.03 12/26/2022 1057   CREATININE 1.07 09/15/2020 0934   CALCIUM 9.1 12/26/2022 1057    Lab Results  Component Value Date   CREATININE 1.03 12/26/2022   GFR 63.63 09/21/2022   GFRNONAA >60 12/26/2022   GFRAA 71 09/15/2020   Lab Results  Component Value Date/Time   HGBA1C 6.3 09/21/2022 09:11 AM   HGBA1C 6.2 09/16/2021 08:35 AM    Lab Results  Component Value Date   CHOL 131 09/21/2022   HDL 46.10 09/21/2022   LDLCALC 62 09/21/2022   TRIG 115.0 09/21/2022   CHOLHDL 3 09/21/2022    Care Gaps: AWV - 12/10/22  COVID Booster - Overdue   Star Rating Drugs:  Atorvastatin 80 mg - last filled at [facility] Lisinopril 10 mg  - last filled at [place] Pharmacist Assistant [phone]

## 2023-01-17 ENCOUNTER — Ambulatory Visit: Payer: Medicare Other | Admitting: Cardiology

## 2023-01-17 ENCOUNTER — Ambulatory Visit: Payer: Medicare Other | Attending: Internal Medicine | Admitting: *Deleted

## 2023-01-17 DIAGNOSIS — I480 Paroxysmal atrial fibrillation: Secondary | ICD-10-CM | POA: Diagnosis not present

## 2023-01-17 DIAGNOSIS — Z5181 Encounter for therapeutic drug level monitoring: Secondary | ICD-10-CM | POA: Insufficient documentation

## 2023-01-17 LAB — POCT INR: POC INR: 3.5

## 2023-01-17 NOTE — Patient Instructions (Signed)
Description   Hold warfarin today and then continue taking warfarin 1 tablet daily. Recheck INR in 3 weeks. Call with any questions or concerns [phone] or [phone]

## 2023-01-18 DIAGNOSIS — Z85820 Personal history of malignant melanoma of skin: Secondary | ICD-10-CM | POA: Diagnosis not present

## 2023-01-18 DIAGNOSIS — Z08 Encounter for follow-up examination after completed treatment for malignant neoplasm: Secondary | ICD-10-CM | POA: Diagnosis not present

## 2023-01-18 DIAGNOSIS — Z85828 Personal history of other malignant neoplasm of skin: Secondary | ICD-10-CM | POA: Diagnosis not present

## 2023-01-18 DIAGNOSIS — L0101 Non-bullous impetigo: Secondary | ICD-10-CM | POA: Diagnosis not present

## 2023-01-18 DIAGNOSIS — D492 Neoplasm of unspecified behavior of bone, soft tissue, and skin: Secondary | ICD-10-CM | POA: Diagnosis not present

## 2023-01-25 ENCOUNTER — Ambulatory Visit: Payer: Medicare Other | Admitting: Family Medicine

## 2023-01-25 ENCOUNTER — Ambulatory Visit (INDEPENDENT_AMBULATORY_CARE_PROVIDER_SITE_OTHER): Payer: Medicare Other | Admitting: Family Medicine

## 2023-01-25 ENCOUNTER — Encounter: Payer: Self-pay | Admitting: Family Medicine

## 2023-01-25 VITALS — BP 122/64 | HR 104 | Temp 98.2°F | Wt 200.0 lb

## 2023-01-25 DIAGNOSIS — I480 Paroxysmal atrial fibrillation: Secondary | ICD-10-CM

## 2023-01-25 DIAGNOSIS — M7989 Other specified soft tissue disorders: Secondary | ICD-10-CM

## 2023-01-25 DIAGNOSIS — I10 Essential (primary) hypertension: Secondary | ICD-10-CM | POA: Diagnosis not present

## 2023-01-25 DIAGNOSIS — H401132 Primary open-angle glaucoma, bilateral, moderate stage: Secondary | ICD-10-CM | POA: Diagnosis not present

## 2023-01-25 MED ORDER — DILTIAZEM HCL 30 MG PO TABS: ORAL_TABLET | ORAL | 5 refills | Status: AC

## 2023-01-25 NOTE — Progress Notes (Signed)
   Subjective:    Patient ID: [name] [name], male    DOB: 1929-01-20, 89 y.o.   MRN: [id number]  HPI Here for one week of increased swelling in the left lower leg. He has had swelling in both legs for years, but this week the swelling is worse than ever. There is no pain, but he feels tightness in the calf and behind the knee. No SOB. No recent trauma. He is on Warfarin for PAF.    Review of Systems  Constitutional: Negative.   Respiratory: Negative.    Cardiovascular:  Positive for leg swelling. Negative for chest pain and palpitations.  Neurological: Negative.        Objective:   Physical Exam Constitutional:      Appearance: Normal appearance.     Comments: Walks with a cane  Cardiovascular:     Rate and Rhythm: Regular rhythm. Tachycardia present.     Pulses: Normal pulses.     Heart sounds: Normal heart sounds.  Pulmonary:     Effort: Pulmonary effort is normal.     Breath sounds: Wheezing and rales present.  Musculoskeletal:     Comments: The right lower leg has 2+ edema as usual. The left lower leg has 4+ edema below the knee. He is mildly tender in the calf and behind the knee. No masses are felt. His ROM in the knee is limited by a feeling of tightness   Neurological:     General: No focal deficit present.     Mental Status: He is alert and oriented to person, place, and time.           Assessment & Plan:  Sudden increase of swelling in the left lower leg. Possible etiologies include a DVT (unlikely given he is anticoagulated) or a Baker's cyst. We will set up a venous doppler of the leg ASAP. He will let us know if he experiences any SOB.  [name], MD

## 2023-01-26 ENCOUNTER — Ambulatory Visit: Payer: Medicare Other | Admitting: Family Medicine

## 2023-01-26 ENCOUNTER — Encounter: Payer: Self-pay | Admitting: Family Medicine

## 2023-01-26 ENCOUNTER — Telehealth: Payer: Medicare Other | Admitting: Family Medicine

## 2023-01-26 NOTE — Telephone Encounter (Signed)
Pt would like nancy to return his call

## 2023-01-26 NOTE — Telephone Encounter (Signed)
Pt is calling and would like to have ultrasound of his leg at [facility] at [address]

## 2023-01-27 ENCOUNTER — Telehealth: Payer: Self-pay | Admitting: Family Medicine

## 2023-01-27 NOTE — Telephone Encounter (Signed)
Spoke with [place]  regarding pt [name] [name], scheduled pt for appointment at 4.30 pm, pt notified and pt requested referral and documents faxed for the appointment.

## 2023-01-27 NOTE — Telephone Encounter (Signed)
Left detailed message on verified VM for patient requesting a call back and making him aware that we received the STAT order for the Venous US but given how late in the afternoon it is this will have to be scheduled in the morning 3/22 with [facility].  [facility] has the order in their WQ.  ATC to reach Dr Sarajane Jews via secure chat to make aware, He must not be in office this afternoon.  Sending msg to Dr Sarajane Jews as well as back to CMA to make aware that we have attempted to reach him this afternoon

## 2023-01-27 NOTE — Telephone Encounter (Signed)
I understand. I just put in another order and sent to [facility]

## 2023-01-27 NOTE — Telephone Encounter (Signed)
Patient calling in, states he missed a call from this office.

## 2023-01-27 NOTE — Addendum Note (Signed)
Addended by: [name] A on: 01/27/2023 01:05 PM   Modules accepted: Orders

## 2023-01-27 NOTE — Telephone Encounter (Signed)
Dr. Gennaro Africa called requesting to speak to Dr. Sarajane Jews about this pt. He said Dr. Sarajane Jews had referred the pt for DVT. Call back number is: [phone].

## 2023-01-28 NOTE — Telephone Encounter (Signed)
We got a verbal report that there were no DVT's. We are trying to get a full report

## 2023-02-02 ENCOUNTER — Encounter: Payer: Self-pay | Admitting: Family Medicine

## 2023-02-03 ENCOUNTER — Ambulatory Visit: Payer: Medicare Other | Attending: Cardiology | Admitting: *Deleted

## 2023-02-03 DIAGNOSIS — Z5181 Encounter for therapeutic drug level monitoring: Secondary | ICD-10-CM | POA: Diagnosis not present

## 2023-02-03 DIAGNOSIS — I480 Paroxysmal atrial fibrillation: Secondary | ICD-10-CM | POA: Insufficient documentation

## 2023-02-03 LAB — POCT INR: POC INR: 2.2

## 2023-02-03 NOTE — Patient Instructions (Signed)
Description   Continue taking warfarin 1 tablet daily. Recheck INR in 6 weeks. Call with any questions or concerns [phone] or [phone]     

## 2023-02-07 ENCOUNTER — Ambulatory Visit (HOSPITAL_COMMUNITY): Payer: Medicare Other

## 2023-02-17 ENCOUNTER — Telehealth: Payer: Self-pay

## 2023-02-17 NOTE — Progress Notes (Signed)
Patient ID: [name] [name], male   DOB: 06-24-1929, 89 y.o.   MRN: [id number]  Care Management & Coordination Services Pharmacy Team  Reason for Encounter: Appointment Reminder  Contacted patient to confirm telephone appointment with Milas Kocher, PharmD on 02/18/23 at 2. Spoke with patient on 02/17/2023     Hospital visits:  Medication Reconciliation was completed by comparing discharge summary, patient's EMR and Pharmacy list, and upon discussion with patient.  Patient presented to [facility] ED on 12/26/22 due to Lightheadedness. Patient was present for 6 hours  New?Medications Started at [facility] Discharge:?? -started  None  Medication Changes at Hospital Discharge: -Changed  none  Medications Discontinued at Hospital Discharge: -Stopped  none  Medications that remain the same after Hospital Discharge:??  -All other medications will remain the same.    Star Rating Drugs:  Atorvastatin 80 mg - last filled at [facility] Lisinopril 10 mg  - last filled at [facility]   Care Gaps: None   Pamala Duffel [facility] Clinical Pharmacist Assistant [phone]

## 2023-02-17 NOTE — Progress Notes (Signed)
Care Management & Coordination Services Pharmacy Note  02/18/2023 Name:  [name] [name] MRN:  [id number] DOB:  [birth year]  Summary: LDL at goal <70 Denies any recent A-fib episodes and reports stable BP, HR readings at home, INR at goal 2-3  Recommendations/Changes made from today's visit: -Counseled on importance of adherence with statin medication and a cholesterol-mindful diet -Counseled on signs of bleed and to get medical attention in the event of a fall  Follow up plan: BP review in 3 months Pharmacist visit in 6 months   Subjective: [name] [name] is an 89 y.o. year old male who is a primary patient of [name] Salisbury, MD.  The care coordination team was consulted for assistance with disease management and care coordination needs.    Engaged with patient by telephone for follow up visit.  Recent office visits: 12/10/22 [name] Rung, LPN - Patient presented for Medicare Annual wellness exam. No medication changes.    09/21/22 [name] Salisbury, MD - Patient presented for Essential hypertension and other concerns. No medication changes  Recent consult visits: 01/07/23 [name] Rotunda, MD (Cardiology) - Patient presented for Dizziness and other concerns. Stopped Amoxicillin. Stopped Benzonatate.    12/10/22 Patient presented for Anti Coag visit [name] was 2.7.   11/26/22 [name] Loveless, PA-C - Patient presented for Acute bacterial sinusitis and other concerns. Prescribed Amoxicillin. Prescribed Benzonatate. Prescribed Polytrim. Prescribed Promethazine.   10/25/22 [name] Reading, MD - Patient presented for glaucoma evaluation. No medication changes.   Hospital visits: 12/26/22 [name] [name] due to dizziness, LOS 6 hours, no med changes   Objective:  Lab Results  Component Value Date   CREATININE 1.03 12/26/2022   BUN 19 12/26/2022   GFR 63.63 09/21/2022   GFRNONAA >60 12/26/2022   GFRAA 71 09/15/2020   NA 140 12/26/2022   K 4.3 12/26/2022   CALCIUM 9.1  12/26/2022   CO2 26 12/26/2022   GLUCOSE 109 (H) 12/26/2022    Lab Results  Component Value Date/Time   HGBA1C 6.3 09/21/2022 09:11 AM   HGBA1C 6.2 09/16/2021 08:35 AM   GFR 63.63 09/21/2022 09:11 AM   GFR 65.61 09/16/2021 08:35 AM    Last diabetic Eye exam: No results found for: "HMDIABEYEEXA"  Last diabetic Foot exam: No results found for: "HMDIABFOOTEX"   Lab Results  Component Value Date   CHOL 131 09/21/2022   HDL 46.10 09/21/2022   LDLCALC 62 09/21/2022   TRIG 115.0 09/21/2022   CHOLHDL 3 09/21/2022       Latest Ref Rng & Units 12/26/2022   10:57 AM 09/21/2022    9:11 AM 03/07/2022    9:00 AM  Hepatic Function  Total Protein 6.5 - 8.1 g/dL 6.0  6.7  5.7   Albumin 3.5 - 5.0 g/dL 3.5  4.5  3.7   AST 15 - 41 U/L 29  25  25    ALT 0 - 44 U/L 28  28  28    Alk Phosphatase 38 - 126 U/L 63  60  61   Total Bilirubin 0.3 - 1.2 mg/dL 0.7  0.7  0.7   Bilirubin, Direct 0.0 - 0.3 mg/dL  0.2      Lab Results  Component Value Date/Time   TSH 1.72 09/21/2022 09:11 AM   TSH 2.06 09/16/2021 08:35 AM       Latest Ref Rng & Units 12/26/2022   10:57 AM 09/21/2022    9:11 AM 03/07/2022    9:13 AM  CBC  WBC 4.0 - 10.5 K/uL 10.4  10.2    Hemoglobin 13.0 - 17.0 g/dL 16.112.3  09.613.9  04.512.6   Hematocrit 39.0 - 52.0 % 37.0  41.8  37.0   Platelets 150 - 400 K/uL 157  181.0      Lab Results  Component Value Date/Time   VD25OH 37 03/13/2014 10:26 AM   VITAMINB12 707 12/29/2020 03:28 PM   VITAMINB12 668 03/13/2014 10:26 AM    Clinical ASCVD: No  The ASCVD Risk score (Arnett DK, et al., 2019) failed to calculate for the following reasons:   The 2019 ASCVD risk score is only valid for ages 5040 to 5979       12/10/2022   10:55 AM 09/21/2022    8:26 AM 02/22/2022   11:07 AM  Depression screen PHQ 2/9  Decreased Interest 0 0 0  Down, Depressed, Hopeless 0 0 0  PHQ - 2 Score 0 0 0  Altered sleeping  0 1  Tired, decreased energy  0 1  Change in appetite  0 0  Feeling bad or failure about  yourself   0 0  Trouble concentrating  0 0  Moving slowly or fidgety/restless  0 0  Suicidal thoughts  0 0  PHQ-9 Score  0 2  Difficult doing work/chores  Not difficult at all Not difficult at all     Social History   Tobacco Use  Smoking Status Never  Smokeless Tobacco Never   BP Readings from Last 3 Encounters:  01/25/23 122/64  01/07/23 136/60  12/26/22 (!) 131/59   Pulse Readings from Last 3 Encounters:  01/25/23 (!) 104  01/07/23 (!) 58  12/26/22 63   Wt Readings from Last 3 Encounters:  01/25/23 200 lb (90.7 kg)  01/07/23 200 lb 9.6 oz (91 kg)  12/26/22 190 lb (86.2 kg)   BMI Readings from Last 3 Encounters:  01/25/23 27.89 kg/m  01/07/23 27.98 kg/m  12/26/22 26.50 kg/m    Allergies  Allergen Reactions   Sulfa Antibiotics Other (See Comments)    Severe peeling of the skin on the groin area   Sulfamethoxazole Other (See Comments)    REACTION: rash    Medications Reviewed Today     Reviewed by Sherrill RaringHerring, Caren Garske H, RPH (Pharmacist) on 02/18/23 at 1343  Med List Status: <None>   Medication Order Taking? Sig Documenting Provider Last Dose Status Informant  atorvastatin (LIPITOR) 80 MG tablet [id number] No Take 1 tablet (80 mg total) by mouth daily. Janetta Horahompson, Kathryn R, PA-C Taking Active Self  CLARITIN 10 MG CAPS [id number] No Take 1 capsule by mouth at bedtime. [provider] Taking Active Self  [facility] ACETAMINOPHEN 325 MG CAPS [id number] No Take 1 capsule by mouth 2 (two) times daily.  [provider] Taking Active Self  diltiazem (CARDIZEM) 30 MG tablet [id number]  Take 1 tablet every 4 hours AS NEEDED for afib heart rate over 100 [name] SalisburyFry, Stephen A, MD  Active   finasteride (PROSCAR) 5 MG tablet [id number] No Take 5 mg daily by mouth. [provider] Taking Active Self  furosemide (LASIX) 40 MG tablet [id number] No TAKE 1 TABLET BY MOUTH DAILY AS NEEDED FOR FLUID (INCREASED SHORTNESS OF BREATH, LEG EDEMA, WEIGHT GAIN > 5 POUNDS IN 1 WEEK)  Nahser, Deloris PingPhilip J, MD Taking Active   latanoprost (XALATAN) 0.005 % ophthalmic solution [id number] No Place 1 drop into both eyes at bedtime.  [provider] Taking Active Self  lisinopril (PRINIVIL,ZESTRIL) 10 MG tablet [id number] No Take  10 mg by mouth at bedtime.  [provider] Taking Active Self  melatonin 5 MG TABS [id number] No Take 5 mg by mouth as needed. [provider] Taking Active   metoprolol tartrate (LOPRESSOR) 12.5 mg TABS tablet [id number] No Take 12.5 mg by mouth 2 (two) times daily. [provider] Taking Active Self  mometasone (NASONEX) 50 MCG/ACT nasal spray [id number] No Place 2 sprays into both nostrils daily as needed (allergies). [provider] Taking Active Self  Multiple Vitamins-Minerals (CENTRUM SILVER PO) [id number] No Take 1 tablet by mouth every morning. [provider] Taking Active Self  pantoprazole (PROTONIX) 40 MG tablet [id number] No Take 1 tablet (40 mg total) by mouth daily. Janetta Hora, PA-C Taking Active Self  potassium chloride (KLOR-CON) 10 MEQ CR tablet [id number] No Take 10 mEq by mouth every morning. [provider] Taking Active Self  promethazine-dextromethorphan (PROMETHAZINE-DM) 6.25-15 MG/5ML syrup [id number] No Take 5 mLs by mouth 4 (four) times daily as needed. [name] Loveless, PA-C Taking Active   tamsulosin (FLOMAX) 0.4 MG CAPS [id number] No Take 0.4 mg by mouth 2 (two) times daily.  [provider] Taking Active Self  TOLAK 4 % CREA [id number] No Apply 1 application topically daily as needed (rough place on scalp). [provider] Taking Active Self  trimethoprim-polymyxin b (POLYTRIM) ophthalmic solution [id number] No Place 1 drop into the left eye every 4 (four) hours. [name] Loveless, PA-C Taking Active   warfarin (COUMADIN) 5 MG tablet [id number] No TAKE 1-2 TABLETS BY MOUTH DAILY AS DIRECTED BY THE [facility] CL[facility] Nahser, Deloris Ping, MD Taking Active              SDOH:  (Social Determinants of Health) assessments and interventions performed: Yes SDOH Interventions    Flowsheet Row Care Coordination from 02/18/2023 in [facility] Clinical Support from 12/10/2022 in [facility] [place] Chronic Care Management from 08/18/2022 in [facility] at [place] Clinical Support from 11/25/2020 in [facility] [facility] [place] Chronic Care Management from 10/14/2020 in [facility] at [place]  SDOH Interventions       Food Insecurity Interventions Intervention Not Indicated Intervention Not Indicated -- Intervention Not Indicated --  Housing Interventions Intervention Not Indicated Intervention Not Indicated -- -- --  Transportation Interventions -- Intervention Not Indicated Intervention Not Indicated Intervention Not Indicated Intervention Not Indicated  Utilities Interventions -- Intervention Not Indicated -- -- --  Alcohol Usage Interventions -- Intervention Not Indicated (Score <7) -- -- --  Depression Interventions/Treatment  -- -- -- PHQ2-9 Score <4 Follow-up Not Indicated --  Financial Strain Interventions -- Intervention Not Indicated -- Intervention Not Indicated Intervention Not Indicated  Physical Activity Interventions -- Intervention Not Indicated -- Intervention Not Indicated --  Stress Interventions -- Intervention Not Indicated -- Intervention Not Indicated --  Social Connections Interventions -- Intervention Not Indicated -- Intervention Not Indicated --       Medication Assistance: None required.  Patient affirms current coverage meets needs.  Medication Access: Within the past 30 days, how often has patient missed a dose of medication? None Is a pillbox or other method used to improve adherence? Yes  Factors that may affect medication adherence? no barriers identified Are meds synced by current pharmacy? No  Are meds delivered by current  pharmacy? No  Does patient experience delays in picking up medications due to transportation concerns? No   [facility] Services Reviewed: Is patient disadvantaged to use [facility]  Pharmacy?: Yes Current Rx insurance plan: BC[employer] Name and location of Current pharmacy:  [facility] - [place], [place] - [address] [place] [address] Phone: [phone] Fax: [phone]  [facility] - [place], [place] - [address] [place] [address] Phone: [phone] Fax: [phone]  [facility] Pharmacy services reviewed with patient today?: No  Patient requests to transfer care to [facility] Pharmacy?: No  Reason patient declined to change pharmacies: Receives medications through [place]  Compliance/Adherence/Medication fill history: Care Gaps: AWV - 12/10/22  COVID Booster - Overdue  Star-Rating Drugs: Atorvastatin 80 mg - last filled at [place] Lisinopril 10 mg  - last filled at [place]   Assessment/Plan   Hyperlipidemia: (LDL goal < 70) -Controlled -Current treatment: Atorvastatin 80mg  1 qd Appropriate, Effective, Safe, Accessible -Medications previously tried: None  -Current dietary patterns: not discussed -Current exercise habits: Limited walking, suffers with balance issues -Educated on Cholesterol goals;  Benefits of statin for ASCVD risk reduction; Importance of limiting foods high in cholesterol; Exercise goal of 150 minutes per week; Strategies to manage statin-induced myalgias; -Recommended to continue current medication  Atrial Fibrillation (Goal: prevent stroke and major bleeding) -Controlled -CHADSVASC: 4 -Current treatment: Diltiazem 30mg  q4h prn HR >100 Appropriate, Effective, Safe, Accessible Metoprolol Tartrate 12.5mg  BID Appropriate, Effective, Safe, Accessible Warfarin 5mg  as directed by coumadin clinic  (1 TAB DAILY CURRENTLY) -Medications previously tried: None -Home BP and  HR readings: checking daily 125/58 HR 63 this morning; 117/56 HR 68 yesterday; 131/64 HR 58 4/10  -Counseled on increased risk of stroke due to Afib and benefits of anticoagulation for stroke prevention; importance of adherence to anticoagulant exactly as prescribed; bleeding risk associated with Warfarin and importance of self-monitoring for signs/symptoms of bleeding; avoidance of NSAIDs due to increased bleeding risk with anticoagulants; importance of regular laboratory monitoring; seeking medical attention after a head injury or if there is blood in the urine/stool; -Recommended to continue current medication  Sherrill Raring Clinical Pharmacist [phone]

## 2023-02-18 ENCOUNTER — Ambulatory Visit: Payer: Medicare Other

## 2023-02-21 DIAGNOSIS — L218 Other seborrheic dermatitis: Secondary | ICD-10-CM | POA: Diagnosis not present

## 2023-02-21 DIAGNOSIS — L570 Actinic keratosis: Secondary | ICD-10-CM | POA: Diagnosis not present

## 2023-03-09 ENCOUNTER — Other Ambulatory Visit: Payer: Self-pay | Admitting: Cardiovascular Disease

## 2023-03-09 DIAGNOSIS — I480 Paroxysmal atrial fibrillation: Secondary | ICD-10-CM

## 2023-03-17 ENCOUNTER — Ambulatory Visit: Payer: Medicare Other | Attending: Cardiology | Admitting: *Deleted

## 2023-03-17 DIAGNOSIS — I480 Paroxysmal atrial fibrillation: Secondary | ICD-10-CM

## 2023-03-17 DIAGNOSIS — Z5181 Encounter for therapeutic drug level monitoring: Secondary | ICD-10-CM

## 2023-03-17 LAB — POCT INR: INR: 2.5 (ref 2.0–3.0)

## 2023-03-17 NOTE — Patient Instructions (Signed)
Description   Continue taking warfarin 1 tablet daily. Recheck INR in 6 weeks. Call with any questions or concerns [phone] or [phone]     

## 2023-03-31 DIAGNOSIS — H401133 Primary open-angle glaucoma, bilateral, severe stage: Secondary | ICD-10-CM | POA: Diagnosis not present

## 2023-03-31 DIAGNOSIS — Z961 Presence of intraocular lens: Secondary | ICD-10-CM | POA: Diagnosis not present

## 2023-04-28 ENCOUNTER — Ambulatory Visit: Payer: Medicare Other | Attending: Cardiology | Admitting: *Deleted

## 2023-04-28 DIAGNOSIS — Z5181 Encounter for therapeutic drug level monitoring: Secondary | ICD-10-CM | POA: Diagnosis not present

## 2023-04-28 DIAGNOSIS — I480 Paroxysmal atrial fibrillation: Secondary | ICD-10-CM

## 2023-04-28 LAB — POCT INR: INR: 2.8 (ref 2.0–3.0)

## 2023-04-28 NOTE — Patient Instructions (Signed)
Description   Continue taking warfarin 1 tablet daily. Recheck INR in 6 weeks. Call with any questions or concerns [phone] or [phone]     

## 2023-05-19 DIAGNOSIS — H401133 Primary open-angle glaucoma, bilateral, severe stage: Secondary | ICD-10-CM | POA: Diagnosis not present

## 2023-05-27 DIAGNOSIS — N400 Enlarged prostate without lower urinary tract symptoms: Secondary | ICD-10-CM | POA: Diagnosis not present

## 2023-05-27 DIAGNOSIS — N200 Calculus of kidney: Secondary | ICD-10-CM | POA: Diagnosis not present

## 2023-06-09 ENCOUNTER — Ambulatory Visit: Payer: Medicare Other | Attending: Cardiology

## 2023-06-09 DIAGNOSIS — Z5181 Encounter for therapeutic drug level monitoring: Secondary | ICD-10-CM

## 2023-06-09 DIAGNOSIS — I480 Paroxysmal atrial fibrillation: Secondary | ICD-10-CM

## 2023-06-09 LAB — POCT INR: INR: 2.2 (ref 2.0–3.0)

## 2023-06-09 NOTE — Patient Instructions (Signed)
Description   Continue taking warfarin 1 tablet daily. Recheck INR in 6 weeks. Call with any questions or concerns [phone] or [phone]     

## 2023-06-27 DIAGNOSIS — H401133 Primary open-angle glaucoma, bilateral, severe stage: Secondary | ICD-10-CM | POA: Diagnosis not present

## 2023-07-17 NOTE — Progress Notes (Unsigned)
  Cardiology Office Note:   Date:  07/17/2023  ID:  [name] [name], DOB 05/11/1929, MRN [id number] PCP: Nelwyn Salisbury, MD  [facility] Providers Cardiologist:  [name], MD {  History of Present Illness:   [name] [name] is a 89 y.o. male who who presents for follow up of PAF.  He was in the ED on late December 2018 with PAF. He saw Dr Johney Frame and considered ablation vs amiodarone.  He decided to pursue medical management.  He was in the hospital with PAF in August 2022.  This episode lasted longer than usual.  He did convert subsequently spontaneously.    He returns for follow up.   He was in the ED late last month with lightheadedness.  I reviewed these records for this visit.    He had no acute findings on MRI.  There were no arrhythmias.  He said he been in atrial fibrillation a couple of times because he thinks he had a lot of chart around Valentine's Day.  He was probably in it twice over couple days for 5 to 6 hours.  He self medicated with some as needed Cardizem and metoprolol.  By the time he was in the emergency room here 4 days later he was not having any palpitations and had normal evaluation as above.  He is otherwise felt well.    ROS: ***  Studies Reviewed:    EKG:       ***  Risk Assessment/Calculations:   {Does this patient have ATRIAL FIBRILLATION?[phone]} No BP recorded.  {Refresh Note OR Click here to enter BP  :1}***        Physical Exam:   VS:  There were no vitals taken for this visit.   Wt Readings from Last 3 Encounters:  01/25/23 200 lb (90.7 kg)  01/07/23 200 lb 9.6 oz (91 kg)  12/26/22 190 lb (86.2 kg)     GEN: Well nourished, well developed in no acute distress NECK: No JVD; No carotid bruits CARDIAC: ***RRR, no murmurs, rubs, gallops RESPIRATORY:  Clear to auscultation without rales, wheezing or rhonchi  ABDOMEN: Soft, non-tender, non-distended EXTREMITIES:  No edema; No deformity   ASSESSMENT AND PLAN:   DIZZINESS: ***  He is  no longer having dizziness.  This may have been related to little dehydration and he was given some fluids in the ED.  No further workup.    PAF:     [name] [name] has a CHA2DS2 - VASc score of 4.    ***  His atrial fibs is relatively infrequent still and he has not had to change therapy.  He does not want to change to DOAC.    CAD:    ***  He denies any chest discomfort.  No change in therapy.    HTN:  BP is *** controlled.  No change in therapy.    CHRONIC DISATOLIC HF:   ***  He has chronic lower extremity swelling.  Otherwise no change in therapy.    {Are you ordering a CV Procedure (e.g. stress test, cath, DCCV, TEE, etc)?   Press F2        :[id number]}  Follow up ***  Signed, [name], MD

## 2023-07-19 ENCOUNTER — Ambulatory Visit: Payer: Medicare Other | Attending: Cardiology | Admitting: Cardiology

## 2023-07-19 ENCOUNTER — Ambulatory Visit (INDEPENDENT_AMBULATORY_CARE_PROVIDER_SITE_OTHER): Payer: Medicare Other

## 2023-07-19 ENCOUNTER — Encounter: Payer: Self-pay | Admitting: Cardiology

## 2023-07-19 VITALS — BP 134/60 | HR 76 | Ht 71.0 in | Wt 205.8 lb

## 2023-07-19 DIAGNOSIS — I480 Paroxysmal atrial fibrillation: Secondary | ICD-10-CM

## 2023-07-19 DIAGNOSIS — I5032 Chronic diastolic (congestive) heart failure: Secondary | ICD-10-CM | POA: Insufficient documentation

## 2023-07-19 DIAGNOSIS — I10 Essential (primary) hypertension: Secondary | ICD-10-CM | POA: Insufficient documentation

## 2023-07-19 DIAGNOSIS — Z5181 Encounter for therapeutic drug level monitoring: Secondary | ICD-10-CM | POA: Insufficient documentation

## 2023-07-19 DIAGNOSIS — I2510 Atherosclerotic heart disease of native coronary artery without angina pectoris: Secondary | ICD-10-CM | POA: Insufficient documentation

## 2023-07-19 LAB — POCT INR: INR: 2 (ref 2.0–3.0)

## 2023-07-19 NOTE — Patient Instructions (Signed)
Medication Instructions:  Your physician has recommended you make the following change in your medication - Take LASIX 40mg , once daily for the next 3 days. -Increase potassium chloride (KLOR-CON) to 10 MEQ CR tablet, once daily for 3 days. (Take whole pill)   *If you need a refill on your cardiac medications before your next appointment, please call your pharmacy*    Follow-Up: At [facility], you and your health needs are our priority.  As part of our continuing mission to provide you with exceptional heart care, we have created designated Provider Care Teams.  These Care Teams include your primary Cardiologist (physician) and Advanced Practice Providers (APPs -  Physician Assistants and Nurse Practitioners) who all work together to provide you with the care you need, when you need it.  We recommend signing up for the patient portal called "MyChart".  Sign up information is provided on this After Visit Summary.  MyChart is used to connect with patients for Virtual Visits (Telemedicine).  Patients are able to view lab/test results, encounter notes, upcoming appointments, etc.  Non-urgent messages can be sent to your provider as well.   To learn more about what you can do with MyChart, go to [email or web address].    Your next appointment:   6 month(s)  The format for your next appointment:   In Person  Provider:   [name], MD

## 2023-07-19 NOTE — Patient Instructions (Signed)
Continue taking warfarin 1 tablet daily.  Recheck INR in 6 weeks.  Call with any questions or concerns [phone] or [phone]

## 2023-07-20 ENCOUNTER — Other Ambulatory Visit: Payer: Self-pay | Admitting: Cardiovascular Disease

## 2023-07-20 NOTE — Telephone Encounter (Signed)
Refill Request.  

## 2023-07-26 DIAGNOSIS — L570 Actinic keratosis: Secondary | ICD-10-CM | POA: Diagnosis not present

## 2023-07-26 DIAGNOSIS — I872 Venous insufficiency (chronic) (peripheral): Secondary | ICD-10-CM | POA: Diagnosis not present

## 2023-07-26 DIAGNOSIS — D492 Neoplasm of unspecified behavior of bone, soft tissue, and skin: Secondary | ICD-10-CM | POA: Diagnosis not present

## 2023-07-26 DIAGNOSIS — Z85820 Personal history of malignant melanoma of skin: Secondary | ICD-10-CM | POA: Diagnosis not present

## 2023-07-26 DIAGNOSIS — L821 Other seborrheic keratosis: Secondary | ICD-10-CM | POA: Diagnosis not present

## 2023-07-26 DIAGNOSIS — C44311 Basal cell carcinoma of skin of nose: Secondary | ICD-10-CM | POA: Diagnosis not present

## 2023-07-26 DIAGNOSIS — D225 Melanocytic nevi of trunk: Secondary | ICD-10-CM | POA: Diagnosis not present

## 2023-07-26 DIAGNOSIS — Z85828 Personal history of other malignant neoplasm of skin: Secondary | ICD-10-CM | POA: Diagnosis not present

## 2023-07-26 DIAGNOSIS — L814 Other melanin hyperpigmentation: Secondary | ICD-10-CM | POA: Diagnosis not present

## 2023-07-26 DIAGNOSIS — Z08 Encounter for follow-up examination after completed treatment for malignant neoplasm: Secondary | ICD-10-CM | POA: Diagnosis not present

## 2023-08-16 DIAGNOSIS — Z23 Encounter for immunization: Secondary | ICD-10-CM | POA: Diagnosis not present

## 2023-08-18 ENCOUNTER — Other Ambulatory Visit: Payer: Medicare Other

## 2023-08-19 ENCOUNTER — Other Ambulatory Visit: Payer: Medicare Other

## 2023-08-19 NOTE — Progress Notes (Signed)
08/19/2023 Name: [name] [name] MRN: [id number] DOB: 04/14/1929  Chief Complaint  Patient presents with   Medication Management    [name] [name] is a 89 y.o. year old male who presented for a telephone visit.   They were referred to the pharmacist by their PCP for assistance in managing complex medication management.    Subjective:  Care Team: Primary Care Provider: Nelwyn Salisbury, MD ; Next Scheduled Visit: 09/23/23  Medication Access/Adherence  Current Pharmacy:  [facility] - [place], [place] - [address] [place] [address] Phone: [phone] Fax: [phone]  [facility] - [place], [place] - [address] [place] [address] Phone: [phone] Fax: [phone]   Patient reports affordability concerns with their medications: No  Patient reports access/transportation concerns to their pharmacy: No  Patient reports adherence concerns with their medications:  No     Heart Failure:  Current medications:  ACEi/ARB/ARNI: Lisinopril 10mg  SGLT2i: No Beta blocker: Metoprolol Tartrate 12.5mg  BID Mineralocorticoid Receptor Antagonist: No Diuretic regimen: Furosemide 40mg  1 qd  Current home blood pressure readings: 127/59 HR 55 today, holding steady Current home weights: checks a few times a week, holding steady at 190lbs on his scale  Patient denies volume overload signs or symptoms including shortness of breath, lower extremity edema, increased use of pillows at night  Current medication access support: None   Objective:  Lab Results  Component Value Date   HGBA1C 6.3 09/21/2022    Lab Results  Component Value Date   CREATININE 1.03 12/26/2022   BUN 19 12/26/2022   NA 140 12/26/2022   K 4.3 12/26/2022   CL 103 12/26/2022   CO2 26 12/26/2022    Lab Results  Component Value Date   CHOL 131 09/21/2022   HDL 46.10 09/21/2022   LDLCALC 62  09/21/2022   TRIG 115.0 09/21/2022   CHOLHDL 3 09/21/2022    Medications Reviewed Today   Medications were not reviewed in this encounter       Assessment/Plan:   Heart Failure: - Currently appropriately managed given stability of condition/comorbid conditions. Opportunity for optimizations if patient becomes to become unstable in their condition - Reviewed appropriate blood pressure monitoring technique and reviewed goal blood pressure - Reviewed to weigh daily and when to contact cardiology with weight gain - Reviewed dietary modifications including being mindful of sodium intake - Recommend to continue medication therapy.    Follow Up Plan: 1 year  Sherrill Raring, PharmD Clinical Pharmacist [phone]

## 2023-08-24 DIAGNOSIS — L821 Other seborrheic keratosis: Secondary | ICD-10-CM | POA: Diagnosis not present

## 2023-08-24 DIAGNOSIS — C44311 Basal cell carcinoma of skin of nose: Secondary | ICD-10-CM | POA: Diagnosis not present

## 2023-08-30 ENCOUNTER — Ambulatory Visit: Payer: Medicare Other | Attending: Cardiology | Admitting: *Deleted

## 2023-08-30 DIAGNOSIS — I480 Paroxysmal atrial fibrillation: Secondary | ICD-10-CM

## 2023-08-30 DIAGNOSIS — Z5181 Encounter for therapeutic drug level monitoring: Secondary | ICD-10-CM | POA: Diagnosis not present

## 2023-08-30 LAB — POCT INR: INR: 2.9 (ref 2.0–3.0)

## 2023-08-30 NOTE — Patient Instructions (Signed)
Description   Continue taking warfarin 1 tablet daily. Recheck INR in 6 weeks. Call with any questions or concerns [phone] or [phone]     

## 2023-09-21 ENCOUNTER — Other Ambulatory Visit: Payer: Self-pay | Admitting: Cardiovascular Disease

## 2023-09-21 DIAGNOSIS — I480 Paroxysmal atrial fibrillation: Secondary | ICD-10-CM

## 2023-09-21 NOTE — Telephone Encounter (Signed)
Refill request for warfarin:  Last INR was 2.9 on 08/30/23 Next INR due 10/11/23 LOV was 07/19/23  Refill approved.

## 2023-09-23 ENCOUNTER — Encounter: Payer: Self-pay | Admitting: Family Medicine

## 2023-09-23 ENCOUNTER — Encounter: Payer: Medicare Other | Admitting: Family Medicine

## 2023-09-23 ENCOUNTER — Ambulatory Visit (INDEPENDENT_AMBULATORY_CARE_PROVIDER_SITE_OTHER): Payer: Medicare Other | Admitting: Family Medicine

## 2023-09-23 VITALS — BP 100/58 | HR 54 | Temp 98.4°F | Ht 71.0 in | Wt 195.0 lb

## 2023-09-23 DIAGNOSIS — N138 Other obstructive and reflux uropathy: Secondary | ICD-10-CM

## 2023-09-23 DIAGNOSIS — N401 Enlarged prostate with lower urinary tract symptoms: Secondary | ICD-10-CM

## 2023-09-23 DIAGNOSIS — K219 Gastro-esophageal reflux disease without esophagitis: Secondary | ICD-10-CM

## 2023-09-23 DIAGNOSIS — K5900 Constipation, unspecified: Secondary | ICD-10-CM

## 2023-09-23 DIAGNOSIS — E782 Mixed hyperlipidemia: Secondary | ICD-10-CM | POA: Diagnosis not present

## 2023-09-23 DIAGNOSIS — I10 Essential (primary) hypertension: Secondary | ICD-10-CM

## 2023-09-23 DIAGNOSIS — H401130 Primary open-angle glaucoma, bilateral, stage unspecified: Secondary | ICD-10-CM | POA: Diagnosis not present

## 2023-09-23 DIAGNOSIS — Z9861 Coronary angioplasty status: Secondary | ICD-10-CM

## 2023-09-23 DIAGNOSIS — R739 Hyperglycemia, unspecified: Secondary | ICD-10-CM | POA: Diagnosis not present

## 2023-09-23 DIAGNOSIS — I480 Paroxysmal atrial fibrillation: Secondary | ICD-10-CM | POA: Diagnosis not present

## 2023-09-23 DIAGNOSIS — I2510 Atherosclerotic heart disease of native coronary artery without angina pectoris: Secondary | ICD-10-CM

## 2023-09-23 LAB — CBC WITH DIFFERENTIAL/PLATELET
Basophils Absolute: 0 10*3/uL (ref 0.0–0.1)
Basophils Relative: 0.4 % (ref 0.0–3.0)
Eosinophils Absolute: 0.2 10*3/uL (ref 0.0–0.7)
Eosinophils Relative: 2.7 % (ref 0.0–5.0)
HCT: 41.1 % (ref 39.0–52.0)
Hemoglobin: 13.7 g/dL (ref 13.0–17.0)
Lymphocytes Relative: 23.3 % (ref 12.0–46.0)
Lymphs Abs: 2 10*3/uL (ref 0.7–4.0)
MCHC: 33.3 g/dL (ref 30.0–36.0)
MCV: 100.7 fL — ABNORMAL HIGH (ref 78.0–100.0)
Monocytes Absolute: 0.8 10*3/uL (ref 0.1–1.0)
Monocytes Relative: 9.2 % (ref 3.0–12.0)
Neutro Abs: 5.5 10*3/uL (ref 1.4–7.7)
Neutrophils Relative %: 64.4 % (ref 43.0–77.0)
Platelets: 186 10*3/uL (ref 150.0–400.0)
RBC: 4.08 Mil/uL — ABNORMAL LOW (ref 4.22–5.81)
RDW: 13.6 % (ref 11.5–15.5)
WBC: 8.5 10*3/uL (ref 4.0–10.5)

## 2023-09-23 LAB — BASIC METABOLIC PANEL
BUN: 30 mg/dL — ABNORMAL HIGH (ref 6–23)
CO2: 29 meq/L (ref 19–32)
Calcium: 9.6 mg/dL (ref 8.4–10.5)
Chloride: 102 meq/L (ref 96–112)
Creatinine, Ser: 1.19 mg/dL (ref 0.40–1.50)
GFR: 53.13 mL/min — ABNORMAL LOW (ref >60.00)
Glucose, Bld: 107 mg/dL — ABNORMAL HIGH (ref 70–99)
Potassium: 4.1 meq/L (ref 3.5–5.1)
Sodium: 140 meq/L (ref 135–145)

## 2023-09-23 LAB — HEMOGLOBIN A1C: Hgb A1c MFr Bld: 6.3 % (ref 4.6–6.5)

## 2023-09-23 LAB — HEPATIC FUNCTION PANEL
ALT: 25 U/L (ref 0–53)
AST: 24 U/L (ref 0–37)
Albumin: 4.7 g/dL (ref 3.5–5.2)
Alkaline Phosphatase: 66 U/L (ref 39–117)
Bilirubin, Direct: 0.2 mg/dL (ref 0.0–0.3)
Total Bilirubin: 0.8 mg/dL (ref 0.2–1.2)
Total Protein: 6.9 g/dL (ref 6.0–8.3)

## 2023-09-23 LAB — PSA: PSA: 0.34 ng/mL (ref 0.10–4.00)

## 2023-09-23 LAB — LIPID PANEL
Cholesterol: 126 mg/dL (ref 0–200)
HDL: 43.2 mg/dL (ref >39.00)
LDL Cholesterol: 60 mg/dL (ref 0–99)
NonHDL: 82.67
Total CHOL/HDL Ratio: 3
Triglycerides: 113 mg/dL (ref 0.0–149.0)
VLDL: 22.6 mg/dL (ref 0.0–40.0)

## 2023-09-23 LAB — TSH: TSH: 1.33 u[IU]/mL (ref 0.35–5.50)

## 2023-09-23 NOTE — Progress Notes (Signed)
Subjective:    Patient ID: [name] [name], male    DOB: 10-04-1929, 89 y.o.   MRN: [id number]  HPI Here to follow up on issues. He is doing fairly well on mist issues, but his vision has deteriorated. He has open angle glaucoma, and he sees Dr. Dione Booze for this. He is using 2 eye drops to treat this, but he has lost a lot of his vision. He no longer drives, and he depends on his son or his daughter-in-law to drive him to appointments, etc. He can still read and use his computer. His son has arranged for a local restaurant to deliver him meals about twice a week. He saw Dr. Antoine Poche on 07-19-23 for HTN, CAD, and PAF. He has been doing well with these issues. He still has an episode of atrial fibrillation about every 2 month. The last one was 3 weeks ago, and it lasted about 20 hours. His heart rate got up to 135. He felt weak during this time, but he had no chest pain or SOB. His leg edema is stable, and he takes Lasix as prescribed. His bowels are moving well. He sees Dr. Mena Goes for BPH, and this is stable. His last US revealed a 12 mm non-obstructing stone in the right kidney.    Review of Systems  Constitutional:  Positive for fatigue.  HENT: Negative.    Eyes:  Positive for visual disturbance.  Respiratory: Negative.    Cardiovascular: Negative.   Gastrointestinal: Negative.   Genitourinary: Negative.   Musculoskeletal: Negative.   Skin: Negative.   Neurological: Negative.   Psychiatric/Behavioral: Negative.         Objective:   Physical Exam Constitutional:      General: He is not in acute distress.    Appearance: He is well-developed. He is not diaphoretic.     Comments: Walks with a cane   HENT:     Head: Normocephalic and atraumatic.     Right Ear: External ear normal.     Left Ear: External ear normal.     Nose: Nose normal.     Mouth/Throat:     Pharynx: No oropharyngeal exudate.  Eyes:     General: No scleral icterus.       Right eye: No discharge.        Left eye:  No discharge.     Conjunctiva/sclera: Conjunctivae normal.     Pupils: Pupils are equal, round, and reactive to light.  Neck:     Thyroid: No thyromegaly.     Vascular: No JVD.     Trachea: No tracheal deviation.  Cardiovascular:     Rate and Rhythm: Normal rate and regular rhythm.     Pulses: Normal pulses.     Heart sounds: Normal heart sounds. No murmur heard.    No friction rub. No gallop.  Pulmonary:     Effort: Pulmonary effort is normal. No respiratory distress.     Breath sounds: Normal breath sounds. No wheezing or rales.  Chest:     Chest wall: No tenderness.  Abdominal:     General: Bowel sounds are normal. There is no distension.     Palpations: Abdomen is soft. There is no mass.     Tenderness: There is no abdominal tenderness. There is no guarding or rebound.  Genitourinary:    Penis: No tenderness.   Musculoskeletal:        General: No tenderness. Normal range of motion.     Cervical back: Neck  supple.  Lymphadenopathy:     Cervical: No cervical adenopathy.  Skin:    General: Skin is warm and dry.     Coloration: Skin is not pale.     Findings: No erythema or rash.  Neurological:     General: No focal deficit present.     Mental Status: He is alert and oriented to person, place, and time.     Cranial Nerves: No cranial nerve deficit.     Motor: No abnormal muscle tone.     Coordination: Coordination normal.     Deep Tendon Reflexes: Reflexes are normal and symmetric. Reflexes normal.  Psychiatric:        Mood and Affect: Mood normal.        Behavior: Behavior normal.        Thought Content: Thought content normal.        Judgment: Judgment normal.           Assessment & Plan:  His HTN, CAD, and PAF are stable. His leg edema is stable. His constipation is stable. His glaucoma has worsened, and he will follow up with Dr. Dione Booze. Get fasting labs for lipids, PSA, etc. We spent a total of (35   ) minutes reviewing records and discussing these issues.   [name], MD

## 2023-10-11 ENCOUNTER — Ambulatory Visit: Payer: Medicare Other | Attending: Cardiology

## 2023-10-11 DIAGNOSIS — I480 Paroxysmal atrial fibrillation: Secondary | ICD-10-CM | POA: Diagnosis not present

## 2023-10-11 DIAGNOSIS — Z5181 Encounter for therapeutic drug level monitoring: Secondary | ICD-10-CM | POA: Diagnosis not present

## 2023-10-11 LAB — POCT INR: INR: 2.3 (ref 2.0–3.0)

## 2023-10-11 NOTE — Patient Instructions (Signed)
Continue taking warfarin 1 tablet daily.  Recheck INR in 6 weeks.  Call with any questions or concerns [phone] or [phone]

## 2023-10-26 DIAGNOSIS — Z961 Presence of intraocular lens: Secondary | ICD-10-CM | POA: Diagnosis not present

## 2023-10-26 DIAGNOSIS — H401133 Primary open-angle glaucoma, bilateral, severe stage: Secondary | ICD-10-CM | POA: Diagnosis not present

## 2023-11-22 ENCOUNTER — Ambulatory Visit: Payer: Medicare Other | Attending: Cardiology | Admitting: *Deleted

## 2023-11-22 DIAGNOSIS — I480 Paroxysmal atrial fibrillation: Secondary | ICD-10-CM | POA: Insufficient documentation

## 2023-11-22 DIAGNOSIS — Z5181 Encounter for therapeutic drug level monitoring: Secondary | ICD-10-CM | POA: Diagnosis not present

## 2023-11-22 LAB — POCT INR: INR: 1.8 — AB (ref 2.0–3.0)

## 2023-11-22 NOTE — Patient Instructions (Signed)
 Description   Today take 1.5 tablets of warfarin then continue taking warfarin 1 tablet daily.  Recheck INR in 5 weeks.  Call with any questions or concerns [phone] or [phone]

## 2023-12-12 DIAGNOSIS — H401133 Primary open-angle glaucoma, bilateral, severe stage: Secondary | ICD-10-CM | POA: Diagnosis not present

## 2023-12-21 ENCOUNTER — Ambulatory Visit: Payer: Medicare Other

## 2023-12-21 VITALS — Ht 71.0 in | Wt 195.0 lb

## 2023-12-21 DIAGNOSIS — Z0000 Encounter for general adult medical examination without abnormal findings: Secondary | ICD-10-CM

## 2023-12-21 NOTE — Patient Instructions (Addendum)
[name] [name] , Thank you for taking time to come for your Medicare Wellness Visit. I appreciate your ongoing commitment to your health goals. Please review the following plan we discussed and let me know if I can assist you in the future.   Referrals/Orders/Follow-Ups/Clinician Recommendations:   This is a list of the screening recommended for you and due dates:  Health Maintenance  Topic Date Due   Zoster (Shingles) Vaccine (1 of 2) 06/28/1981   COVID-19 Vaccine (6 - 2024-25 season) 07/10/2023   Medicare Annual Wellness Visit  12/20/2024   DTaP/Tdap/Td vaccine (4 - Td or Tdap) 08/25/2026   Pneumonia Vaccine  Completed   Flu Shot  Completed   HPV Vaccine  Aged Out    Advanced directives: (Copy Requested) Please bring a copy of your health care power of attorney and living will to the office to be added to your chart at your convenience.  Next Medicare Annual Wellness Visit scheduled for next year: Yes

## 2023-12-21 NOTE — Progress Notes (Signed)
Subjective:   [name] [name] is a 89 y.o. male who presents for Medicare Annual/Subsequent preventive examination.  Visit Complete: Virtual I connected with  Lise Auer on 12/21/23 by a audio enabled telemedicine application and verified that I am speaking with the correct person using two identifiers.  Patient Location: Home  Provider Location: Home Office  I discussed the limitations of evaluation and management by telemedicine. The patient expressed understanding and agreed to proceed.  Vital Signs: Because this visit was a virtual/telehealth visit, some criteria may be missing or patient reported. Any vitals not documented were not able to be obtained and vitals that have been documented are patient reported.  Patient Medicare AWV questionnaire was completed by the patient on 12/17/23; I have confirmed that all information answered by patient is correct and no changes since this date.  Cardiac Risk Factors include: male gender;hypertension;advanced age (>99men, >65 women)     Objective:    Today's Vitals   12/21/23 1043  Weight: 195 lb (88.5 kg)  Height: 5\' 11"  (1.803 m)   Body mass index is 27.2 kg/m.     12/21/2023   10:52 AM 12/26/2022    9:49 AM 12/10/2022   10:57 AM 03/07/2022    8:37 AM 11/26/2021    2:45 PM 06/09/2021   10:46 PM 06/09/2021    9:53 PM  Advanced Directives  Does Patient Have a Medical Advance Directive? Yes Yes Yes Yes Yes Yes Yes  Type of Estate agent of [place];Living will Healthcare Power of [place];Living will Healthcare Power of [place];Living will  Healthcare Power of [place];Living will Healthcare Power of [place];Living will   Does patient want to make changes to medical advance directive?  No - Patient declined   No - Patient declined No - Patient declined   Copy of Healthcare Power of Attorney in Chart? No - copy requested  No - copy requested  No - copy requested      Current Medications (verified) Outpatient  Encounter Medications as of 12/21/2023  Medication Sig   atorvastatin (LIPITOR) 80 MG tablet Take 1 tablet (80 mg total) by mouth daily.   Brinzolamide-Brimonidine (SIMBRINZA) 1-0.2 % SUSP Apply 1 drop to eye 2 (two) times daily.   CLARITIN 10 MG CAPS Take 1 capsule by mouth at bedtime.   [employer] ACETAMINOPHEN 325 MG CAPS Take 1 capsule by mouth 2 (two) times daily.    diltiazem (CARDIZEM) 30 MG tablet Take 1 tablet every 4 hours AS NEEDED for afib heart rate over 100   finasteride (PROSCAR) 5 MG tablet Take 5 mg daily by mouth.   furosemide (LASIX) 40 MG tablet Take 1 tablet (40 mg total) by mouth daily.   Latanoprostene Bunod (VYZULTA OP) Apply 1 drop to eye daily.   lisinopril (PRINIVIL,ZESTRIL) 10 MG tablet Take 10 mg by mouth at bedtime.    melatonin 5 MG TABS Take 5 mg by mouth as needed.   metoprolol tartrate (LOPRESSOR) 12.5 mg TABS tablet Take 12.5 mg by mouth 2 (two) times daily.   mometasone (NASONEX) 50 MCG/ACT nasal spray Place 2 sprays into both nostrils daily as needed (allergies).   Multiple Vitamins-Minerals (CENTRUM SILVER PO) Take 1 tablet by mouth every morning.   pantoprazole (PROTONIX) 40 MG tablet Take 1 tablet (40 mg total) by mouth daily.   potassium chloride (KLOR-CON) 10 MEQ CR tablet Take 10 mEq by mouth every morning.   promethazine-dextromethorphan (PROMETHAZINE-DM) 6.25-15 MG/5ML syrup Take 5 mLs by mouth 4 (four) times  daily as needed.   tamsulosin (FLOMAX) 0.4 MG CAPS Take 0.4 mg by mouth 2 (two) times daily.    TOLAK 4 % CREA Apply 1 application topically daily as needed (rough place on scalp).   trimethoprim-polymyxin b (POLYTRIM) ophthalmic solution Place 1 drop into the left eye every 4 (four) hours.   warfarin (COUMADIN) 5 MG tablet TAKE 1-2 TABLETS BY MOUTH DAILY AS DIRECTED BY THE COUMDADIN CLINIC   No facility-administered encounter medications on file as of 12/21/2023.    Allergies (verified) Sulfa antibiotics and Sulfamethoxazole   History: Past  Medical History:  Diagnosis Date   Coronary artery disease    a. LHC on 04/06/16 which revealed 40% occl pRCA, 85% occl D1, 75% occl Ramus, 65% occl mLCx, 45% occl pLAD, 90% occl pLAD and nl LV function. s/p PCI/DES to prox LAD. Medical therapy for diag disease.     GERD (gastroesophageal reflux disease)    Glaucoma    sees Dr. Nile Riggs   Hemorrhoids    internal   HTN (hypertension)    Hx of colonic polyps    tubular adenoma    Hyperlipidemia    Kidney stone    Melanoma of cheek (HCC)    Nephrolithiasis    Pancolonic diverticulosis    Paroxysmal atrial fibrillation (HCC)    a. s/p ablation 12/2014: on coumadin   Past Surgical History:  Procedure Laterality Date   ATRIAL FIBRILLATION ABLATION N/A 11/12/2014   Procedure: ATRIAL FIBRILLATION ABLATION;  Surgeon: Hillis Range, MD;  Location: [facility] CATH LAB;  Service: Cardiovascular;  Laterality: N/A;   BLADDER SURGERY     ruptured blood vessel in the bladder 3 weeks S/P ureter stones removed   CARDIAC CATHETERIZATION N/A 04/06/2016   Procedure: Left Heart Cath and Coronary Angiography;  Surgeon: Lyn Records, MD;  Location: [facility] INVASIVE CV LAB;  Service: Cardiovascular;  Laterality: N/A;   CARDIAC CATHETERIZATION N/A 04/06/2016   Procedure: Coronary Stent Intervention;  Surgeon: Lyn Records, MD;  Location: [facility] INVASIVE CV LAB;  Service: Cardiovascular;  Laterality: N/A;   CATARACT EXTRACTION W/ INTRAOCULAR LENS  IMPLANT, BILATERAL Bilateral    COLONOSCOPY  06-29-12   per Dr. Leone Payor, adenomatous polyps, repeat in 3 yrs    CORONARY ANGIOPLASTY WITH STENT PLACEMENT  04/06/2016   "1 stent"   CYSTOSCOPY/RETROGRADE/URETEROSCOPY/STONE EXTRACTION WITH BASKET Left    removed 2 stones from left ureter   INGUINAL HERNIA REPAIR Right 04/1990   Dr. Kendrick Ranch   LAPAROSCOPIC CHOLECYSTECTOMY  [phone]   Dr. Kendrick Ranch   MELANOMA EXCISION Right    "@ cheek near ear"   TEE WITHOUT CARDIOVERSION N/A 11/11/2014   Procedure: TRANSESOPHAGEAL ECHOCARDIOGRAM (TEE);   Surgeon: Quintella Reichert, MD;  Location: [facility] ENDOSCOPY;  Service: Cardiovascular;  Laterality: N/A;  needs INR before case   VASECTOMY     Family History  Problem Relation Age of Onset   Heart attack Father 39   Heart disease Father    Ovarian cancer Mother    Social History   Socioeconomic History   Marital status: Widowed    Spouse name: Not on file   Number of children: 2   Years of education: Not on file   Highest education level: Bachelor's degree (e.g., BA, AB, BS)  Occupational History   Occupation: Retired  Tobacco Use   Smoking status: Never   Smokeless tobacco: Never  Vaping Use   Vaping status: Never Used  Substance and Sexual Activity   Alcohol use: No  Alcohol/week: 0.0 standard drinks of alcohol   Drug use: No   Sexual activity: Not Currently  Other Topics Concern   Not on file  Social History Narrative   Not on file   Social Drivers of Health   Financial Resource Strain: Low Risk  (12/21/2023)   Overall Financial Resource Strain (CARDIA)    Difficulty of Paying Living Expenses: Not hard at all  Food Insecurity: No Food Insecurity (12/21/2023)   Hunger Vital Sign    Worried About Running Out of Food in the Last Year: Never true    Ran Out of Food in the Last Year: Never true  Transportation Needs: No Transportation Needs (12/21/2023)   PRAPARE - Administrator, Civil Service (Medical): No    Lack of Transportation (Non-Medical): No  Physical Activity: Inactive (12/21/2023)   Exercise Vital Sign    Days of Exercise per Week: 0 days    Minutes of Exercise per Session: 0 min  Stress: No Stress Concern Present (12/21/2023)   [employer] of Occupational Health - Occupational Stress Questionnaire    Feeling of Stress : Not at all  Social Connections: Moderately Integrated (12/21/2023)   Social Connection and Isolation Panel [NHANES]    Frequency of Communication with Friends and Family: More than three times a week    Frequency of Social  Gatherings with Friends and Family: More than three times a week    Attends Religious Services: More than 4 times per year    Active Member of [employer] or Organizations: Yes    Attends Banker Meetings: More than 4 times per year    Marital Status: Widowed    Tobacco Counseling Counseling given: Not Answered   Clinical Intake:  Pre-visit preparation completed: Yes  Pain : No/denies pain     BMI - recorded: 27.2 Nutritional Status: BMI 25 -29 Overweight Nutritional Risks: None Diabetes: No  How often do you need to have someone help you when you read instructions, pamphlets, or other written materials from your doctor or pharmacy?: 1 - Never  Interpreter Needed?: No  Information entered by :: Theresa Mulligan LPN   Activities of Daily Living    12/21/2023   10:51 AM 12/17/2023    1:56 PM  In your present state of health, do you have any difficulty performing the following activities:  Hearing? 0 0  Vision? 1 1  Comment Followed by medical attention   Difficulty concentrating or making decisions? 0 0  Walking or climbing stairs? 0 0  Dressing or bathing? 0 0  Doing errands, shopping? 1 1  Comment Son Advice worker and eating ? N N  Using the Toilet? N N  In the past six months, have you accidently leaked urine? N N  Do you have problems with loss of bowel control? N N  Managing your Medications? N N  Managing your Finances? N N  Housekeeping or managing your Housekeeping? N N    Patient Care Team: Nelwyn Salisbury, MD as PCP - General [name], MD as PCP - Cardiology (Cardiology) Leitha Bleak Milas Kocher, [facility] (Pharmacist)  Indicate any recent Medical Services you may have received from other than [facility] providers in the past year (date may be approximate).     Assessment:   This is a routine wellness examination for Yordi.  Hearing/Vision screen Hearing Screening - Comments:: Denies hearing difficulties   Vision Screening - Comments:: Wears  rx glasses - up to date with routine  eye exams with  Dr Dione Booze   Goals Addressed               This Visit's Progress     Stay Active (pt-stated)         Depression Screen    12/21/2023   10:50 AM 12/10/2022   10:55 AM 09/21/2022    8:26 AM 02/22/2022   11:07 AM 11/26/2021    2:37 PM 09/16/2021   11:59 AM 11/25/2020    3:44 PM  PHQ 2/9 Scores  PHQ - 2 Score 0 0 0 0 0 1 0  PHQ- 9 Score   0 2  2 0    Fall Risk    12/21/2023   10:50 AM 12/17/2023    1:56 PM 12/10/2022   10:57 AM 12/09/2022    1:13 PM 09/21/2022    8:26 AM  Fall Risk   Falls in the past year? 0 0 0 0 0  Number falls in past yr: 0 0 0 0 0  Injury with Fall? 0 0 0 0 0  Risk for fall due to : No Fall Risks  No Fall Risks  No Fall Risks  Follow up Falls prevention discussed;Falls evaluation completed  Falls prevention discussed  Falls evaluation completed    MEDICARE RISK AT HOME: Medicare Risk at Home Any stairs in or around the home?: Yes If so, are there any without handrails?: No Home free of loose throw rugs in walkways, pet beds, electrical cords, etc?: Yes Adequate lighting in your home to reduce risk of falls?: Yes Life alert?: No Use of a cane, walker or w/c?: Yes Grab bars in the bathroom?: Yes Shower chair or bench in shower?: No Elevated toilet seat or a handicapped toilet?: No  TIMED UP AND GO:  Was the test performed?  No    Cognitive Function:    01/20/2017   11:49 AM  MMSE - Mini Mental State Exam  Not completed: --        12/21/2023   10:53 AM 12/10/2022   10:58 AM 11/26/2021    2:42 PM  6CIT Screen  What Year? 0 points 0 points 0 points  What month? 0 points 0 points 0 points  What time? 0 points 0 points 0 points  Count back from 20 0 points 0 points 0 points  Months in reverse 0 points 0 points 0 points  Repeat phrase 0 points 0 points 0 points  Total Score 0 points 0 points 0 points    Immunizations Immunization History  Administered Date(s) Administered   Fluad Quad(high  Dose 65+) 07/27/2019   Influenza Split 08/19/2011, 08/21/2012   Influenza Whole 08/14/2007, 08/01/2008, 08/11/2009, 08/17/2010   Influenza, High Dose Seasonal PF 08/25/2015, 08/25/2016, 07/29/2017, 07/25/2018, 09/02/2020, 08/03/2022   Influenza,inj,Quad PF,6+ Mos 08/22/2013, 08/23/2014   Influenza-Unspecified 08/08/2000, 09/08/2001, 08/12/2002, 08/12/2003, 08/08/2004, 10/05/2005, 08/08/2006, 09/03/2021   Moderna Covid-19 Fall Seasonal Vaccine 6yrs & older 08/23/2022   Moderna Covid-19 Vaccine Bivalent Booster 45yrs & up 09/15/2021, 09/13/2022   Moderna Sars-Covid-2 Vaccination 11/20/2019, 12/18/2019, 09/07/2020   Pneumococcal Conjugate-13 03/07/2014   Pneumococcal Polysaccharide-23 11/08/1998, 08/08/2000, 09/08/2005, 08/13/2006   Td 06/08/2001, 07/16/2008   Tdap 08/25/2016   Zoster, Live 08/24/2011    TDAP status: Up to date  Flu Vaccine status: Up to date  Pneumococcal vaccine status: Up to date  Covid-19 vaccine status: Declined, Education has been provided regarding the importance of this vaccine but patient still declined. Advised may receive this vaccine at local pharmacy or  Health Dept.or vaccine clinic. Aware to provide a copy of the vaccination record if obtained from local pharmacy or Health Dept. Verbalized acceptance and understanding.  Qualifies for Shingles Vaccine? Yes   Zostavax completed No   Shingrix Completed?: No.    Education has been provided regarding the importance of this vaccine. Patient has been advised to call insurance company to determine out of pocket expense if they have not yet received this vaccine. Advised may also receive vaccine at local pharmacy or Health Dept. Verbalized acceptance and understanding.  Screening Tests Health Maintenance  Topic Date Due   Zoster Vaccines- Shingrix (1 of 2) 06/28/1981   COVID-19 Vaccine (6 - 2024-25 season) 07/10/2023   Medicare Annual Wellness (AWV)  12/20/2024   DTaP/Tdap/Td (4 - Td or Tdap) 08/25/2026    Pneumonia Vaccine 6+ Years old  Completed   INFLUENZA VACCINE  Completed   HPV VACCINES  Aged Out    Health Maintenance  Health Maintenance Due  Topic Date Due   Zoster Vaccines- Shingrix (1 of 2) 06/28/1981   COVID-19 Vaccine (6 - 2024-25 season) 07/10/2023         Additional Screening:    Vision Screening: Recommended annual ophthalmology exams for early detection of glaucoma and other disorders of the eye. Is the patient up to date with their annual eye exam?  Yes  Who is the provider or what is the name of the office in which the patient attends annual eye exams? Dr Dione Booze If pt is not established with a provider, would they like to be referred to a provider to establish care? No .   Dental Screening: Recommended annual dental exams for proper oral hygiene    Community Resource Referral / Chronic Care Management:  CRR required this visit?  No   CCM required this visit?  No     Plan:     I have personally reviewed and noted the following in the patient's chart:   Medical and social history Use of alcohol, tobacco or illicit drugs  Current medications and supplements including opioid prescriptions. Patient is not currently taking opioid prescriptions. Functional ability and status Nutritional status Physical activity Advanced directives List of other physicians Hospitalizations, surgeries, and ER visits in previous 12 months Vitals Screenings to include cognitive, depression, and falls Referrals and appointments  In addition, I have reviewed and discussed with patient certain preventive protocols, quality metrics, and best practice recommendations. A written personalized care plan for preventive services as well as general preventive health recommendations were provided to patient.     Tillie Rung, LPN   1/61/0960   After Visit Summary: (MyChart) Due to this being a telephonic visit, the after visit summary with patients personalized plan was  offered to patient via MyChart   Nurse Notes: None

## 2023-12-27 ENCOUNTER — Ambulatory Visit: Payer: Medicare Other | Attending: Cardiovascular Disease | Admitting: *Deleted

## 2023-12-27 DIAGNOSIS — I480 Paroxysmal atrial fibrillation: Secondary | ICD-10-CM | POA: Insufficient documentation

## 2023-12-27 DIAGNOSIS — Z5181 Encounter for therapeutic drug level monitoring: Secondary | ICD-10-CM | POA: Insufficient documentation

## 2023-12-27 LAB — POCT INR: INR: 2.5 (ref 2.0–3.0)

## 2023-12-27 NOTE — Patient Instructions (Signed)
Description   Continue taking warfarin 1 tablet daily. Recheck INR in 6 weeks. Call with any questions or concerns [phone] or [phone]     

## 2024-01-24 DIAGNOSIS — Z08 Encounter for follow-up examination after completed treatment for malignant neoplasm: Secondary | ICD-10-CM | POA: Diagnosis not present

## 2024-01-24 DIAGNOSIS — L570 Actinic keratosis: Secondary | ICD-10-CM | POA: Diagnosis not present

## 2024-01-24 DIAGNOSIS — Z85828 Personal history of other malignant neoplasm of skin: Secondary | ICD-10-CM | POA: Diagnosis not present

## 2024-01-24 DIAGNOSIS — Z09 Encounter for follow-up examination after completed treatment for conditions other than malignant neoplasm: Secondary | ICD-10-CM | POA: Diagnosis not present

## 2024-01-24 DIAGNOSIS — C44219 Basal cell carcinoma of skin of left ear and external auricular canal: Secondary | ICD-10-CM | POA: Diagnosis not present

## 2024-01-24 DIAGNOSIS — L821 Other seborrheic keratosis: Secondary | ICD-10-CM | POA: Diagnosis not present

## 2024-01-24 DIAGNOSIS — D225 Melanocytic nevi of trunk: Secondary | ICD-10-CM | POA: Diagnosis not present

## 2024-01-24 DIAGNOSIS — Z85820 Personal history of malignant melanoma of skin: Secondary | ICD-10-CM | POA: Diagnosis not present

## 2024-01-24 DIAGNOSIS — D492 Neoplasm of unspecified behavior of bone, soft tissue, and skin: Secondary | ICD-10-CM | POA: Diagnosis not present

## 2024-01-24 DIAGNOSIS — Z872 Personal history of diseases of the skin and subcutaneous tissue: Secondary | ICD-10-CM | POA: Diagnosis not present

## 2024-01-24 DIAGNOSIS — L814 Other melanin hyperpigmentation: Secondary | ICD-10-CM | POA: Diagnosis not present

## 2024-01-26 DIAGNOSIS — H401133 Primary open-angle glaucoma, bilateral, severe stage: Secondary | ICD-10-CM | POA: Diagnosis not present

## 2024-02-07 ENCOUNTER — Ambulatory Visit: Payer: Medicare Other | Attending: Cardiology

## 2024-02-07 DIAGNOSIS — Z5181 Encounter for therapeutic drug level monitoring: Secondary | ICD-10-CM | POA: Insufficient documentation

## 2024-02-07 DIAGNOSIS — I480 Paroxysmal atrial fibrillation: Secondary | ICD-10-CM | POA: Insufficient documentation

## 2024-02-07 LAB — POCT INR: INR: 1.9 — AB (ref 2.0–3.0)

## 2024-02-07 NOTE — Patient Instructions (Signed)
 Take 1.5 tablets tonight only then Continue taking warfarin 1 tablet daily.  Recheck INR in 6 weeks.  Call with any questions or concerns [phone] or [phone]

## 2024-02-20 ENCOUNTER — Emergency Department (HOSPITAL_BASED_OUTPATIENT_CLINIC_OR_DEPARTMENT_OTHER)
Admission: EM | Admit: 2024-02-20 | Discharge: 2024-02-20 | Disposition: A | Discharge status: Home / Self Care | Attending: Emergency Medicine | Admitting: Emergency Medicine

## 2024-02-20 ENCOUNTER — Ambulatory Visit: Admission: EM | Admit: 2024-02-20 | Discharge: 2024-02-20 | Disposition: A | Discharge status: Home / Self Care

## 2024-02-20 ENCOUNTER — Emergency Department (HOSPITAL_BASED_OUTPATIENT_CLINIC_OR_DEPARTMENT_OTHER)

## 2024-02-20 ENCOUNTER — Other Ambulatory Visit: Payer: Self-pay

## 2024-02-20 ENCOUNTER — Encounter (HOSPITAL_BASED_OUTPATIENT_CLINIC_OR_DEPARTMENT_OTHER): Payer: Self-pay | Admitting: Emergency Medicine

## 2024-02-20 DIAGNOSIS — Z7901 Long term (current) use of anticoagulants: Secondary | ICD-10-CM

## 2024-02-20 DIAGNOSIS — I10 Essential (primary) hypertension: Secondary | ICD-10-CM | POA: Insufficient documentation

## 2024-02-20 DIAGNOSIS — Y92009 Unspecified place in unspecified non-institutional (private) residence as the place of occurrence of the external cause: Secondary | ICD-10-CM

## 2024-02-20 DIAGNOSIS — I480 Paroxysmal atrial fibrillation: Secondary | ICD-10-CM | POA: Insufficient documentation

## 2024-02-20 DIAGNOSIS — I2510 Atherosclerotic heart disease of native coronary artery without angina pectoris: Secondary | ICD-10-CM | POA: Insufficient documentation

## 2024-02-20 DIAGNOSIS — S0990XA Unspecified injury of head, initial encounter: Secondary | ICD-10-CM

## 2024-02-20 DIAGNOSIS — S0101XA Laceration without foreign body of scalp, initial encounter: Secondary | ICD-10-CM | POA: Insufficient documentation

## 2024-02-20 DIAGNOSIS — S61412A Laceration without foreign body of left hand, initial encounter: Secondary | ICD-10-CM | POA: Diagnosis not present

## 2024-02-20 DIAGNOSIS — M4802 Spinal stenosis, cervical region: Secondary | ICD-10-CM | POA: Diagnosis not present

## 2024-02-20 DIAGNOSIS — S6992XA Unspecified injury of left wrist, hand and finger(s), initial encounter: Secondary | ICD-10-CM

## 2024-02-20 DIAGNOSIS — R791 Abnormal coagulation profile: Secondary | ICD-10-CM | POA: Diagnosis not present

## 2024-02-20 DIAGNOSIS — Y92002 Bathroom of unspecified non-institutional (private) residence single-family (private) house as the place of occurrence of the external cause: Secondary | ICD-10-CM | POA: Insufficient documentation

## 2024-02-20 DIAGNOSIS — Z79899 Other long term (current) drug therapy: Secondary | ICD-10-CM | POA: Diagnosis not present

## 2024-02-20 DIAGNOSIS — S199XXA Unspecified injury of neck, initial encounter: Secondary | ICD-10-CM | POA: Diagnosis not present

## 2024-02-20 DIAGNOSIS — W182XXA Fall in (into) shower or empty bathtub, initial encounter: Secondary | ICD-10-CM | POA: Insufficient documentation

## 2024-02-20 DIAGNOSIS — M5030 Other cervical disc degeneration, unspecified cervical region: Secondary | ICD-10-CM | POA: Diagnosis not present

## 2024-02-20 LAB — PROTIME-INR
INR: 2.4 — ABNORMAL HIGH (ref 0.8–1.2)
Prothrombin Time: 26.5 s — ABNORMAL HIGH (ref 11.4–15.2)

## 2024-02-20 LAB — CBC WITH DIFFERENTIAL/PLATELET
Abs Immature Granulocytes: 0.06 10*3/uL (ref 0.00–0.07)
Basophils Absolute: 0 10*3/uL (ref 0.0–0.1)
Basophils Relative: 0 %
Eosinophils Absolute: 0.2 10*3/uL (ref 0.0–0.5)
Eosinophils Relative: 1 %
HCT: 38.3 % — ABNORMAL LOW (ref 39.0–52.0)
Hemoglobin: 12.9 g/dL — ABNORMAL LOW (ref 13.0–17.0)
Immature Granulocytes: 0 %
Lymphocytes Relative: 11 %
Lymphs Abs: 1.5 10*3/uL (ref 0.7–4.0)
MCH: 33.2 pg (ref 26.0–34.0)
MCHC: 33.7 g/dL (ref 30.0–36.0)
MCV: 98.5 fL (ref 80.0–100.0)
Monocytes Absolute: 1.2 10*3/uL — ABNORMAL HIGH (ref 0.1–1.0)
Monocytes Relative: 9 %
Neutro Abs: 11.1 10*3/uL — ABNORMAL HIGH (ref 1.7–7.7)
Neutrophils Relative %: 79 %
Platelets: 174 10*3/uL (ref 150–400)
RBC: 3.89 MIL/uL — ABNORMAL LOW (ref 4.22–5.81)
RDW: 13.2 % (ref 11.5–15.5)
WBC: 14.1 10*3/uL — ABNORMAL HIGH (ref 4.0–10.5)
nRBC: 0 % (ref 0.0–0.2)

## 2024-02-20 LAB — COMPREHENSIVE METABOLIC PANEL WITH GFR
ALT: 27 U/L (ref 0–44)
AST: 38 U/L (ref 15–41)
Albumin: 3.9 g/dL (ref 3.5–5.0)
Alkaline Phosphatase: 55 U/L (ref 38–126)
Anion gap: 10 (ref 5–15)
BUN: 29 mg/dL — ABNORMAL HIGH (ref 8–23)
CO2: 23 mmol/L (ref 22–32)
Calcium: 8.9 mg/dL (ref 8.9–10.3)
Chloride: 106 mmol/L (ref 98–111)
Creatinine, Ser: 1.18 mg/dL (ref 0.61–1.24)
GFR, Estimated: 58 mL/min — ABNORMAL LOW (ref >60)
Glucose, Bld: 111 mg/dL — ABNORMAL HIGH (ref 70–99)
Potassium: 4.7 mmol/L (ref 3.5–5.1)
Sodium: 139 mmol/L (ref 135–145)
Total Bilirubin: 1.1 mg/dL (ref 0.0–1.2)
Total Protein: 6.6 g/dL (ref 6.5–8.1)

## 2024-02-20 LAB — MAGNESIUM: Magnesium: 2.1 mg/dL (ref 1.7–2.4)

## 2024-02-20 NOTE — ED Notes (Signed)
 Patient is being discharged from the Urgent Care and sent to the Emergency Department via pov . Per Susa Engman, NP, patient is in need of higher level of care due to head injury on coumadin. Patient is aware and verbalizes understanding of plan of care.  Vitals:   02/20/24 1457  BP: (!) 154/77  Pulse: 77  Resp: 18  Temp: 98.3 F (36.8 C)  SpO2: 98%

## 2024-02-20 NOTE — ED Provider Notes (Signed)
 Patient in today with a head injury accompanied by family member.  Patient sustained a fall in the shower which resulted in a cut on his head and hand.  Patient is anticoagulated with Coumadin and head injury requires a head CT. Family notified by this provider that patient warrants ED evaluation due to head injury.  Family will transport by personal automobile. [facility]   Buena Carmine, NP 02/20/24 1511

## 2024-02-20 NOTE — Discharge Instructions (Signed)
 Thank you for coming to [facility] Emergency Department. You were seen for fall at home. We did an exam, labs, and imaging, and these showed a small laceration to your scalp.  This was closed with 1 single staple.  Please have this removed by a medical provider in 7 to 10 days.  Please watch for signs of infection including increased redness, increased pain or swelling, pus drainage, or fever/chills.  You can wash your hair and shower like normal, please do not scrub on the staple. Please keep the skin tear on your left hand clean and dry, you can change the dressing every 1-2 days.   Please follow up with your primary care provider within 1 week.   Do not hesitate to return to the ED or call 911 if you experience: -Worsening symptoms -Lightheadedness, passing out -Fevers/chills -Anything else that concerns you

## 2024-02-20 NOTE — ED Triage Notes (Addendum)
 Fell in shower about 2 pm went to urgent care , pt hurt his  head, ~ 1 inc lac to left back of head ,no bleeding at this time  unkn last tetanus , pt is on coumadin  last check of PT INR was 2 weeks ago  ,has   skin tears to left hand .  Is worried about a basal cell  spot  that was to come off at dermatologist  tomorrow ,, states fall tore off scab  on that spot behind  his left ear, pt denies LOC after fall states got right up ,  AAOx4 able to walk  but balance is off states family

## 2024-02-20 NOTE — Discharge Instructions (Signed)
 Immediately to the emergency department for a head scan given head injury and  anticoagulation

## 2024-02-20 NOTE — ED Provider Notes (Signed)
 [place] [facility] AT [facility] Provider Note   CSN: [id number] Arrival date & time: 02/20/24  1533     History {Add pertinent medical, surgical, social history, OB history to HPI:1} Chief Complaint  Patient presents with  . Fall    [name] [name] is a 89 y.o. male with PMH as listed below who presents fall in shower about 2 pm went to urgent care and was sent to ED, pt hurt his head, behind his left ear, pt is on coumadin last check of PT INR was 2 weeks ago has skin tears to left hand. Denies LOC or neck pain, denies chest/abdominal pain. Otherwise was in his NSOH.   Past Medical History:  Diagnosis Date  . Coronary artery disease    a. LHC on 04/06/16 which revealed 40% occl pRCA, 85% occl D1, 75% occl Ramus, 65% occl mLCx, 45% occl pLAD, 90% occl pLAD and nl LV function. s/p PCI/DES to prox LAD. Medical therapy for diag disease.    Marland Kitchen GERD (gastroesophageal reflux disease)   . Glaucoma    sees Dr. Nile Riggs  . Hemorrhoids    internal  . HTN (hypertension)   . Hx of colonic polyps    tubular adenoma   . Hyperlipidemia   . Kidney stone   . Melanoma of cheek (HCC)   . Nephrolithiasis   . Pancolonic diverticulosis   . Paroxysmal atrial fibrillation (HCC)    a. s/p ablation 12/2014: on coumadin       Home Medications Prior to Admission medications   Medication Sig Start Date End Date Taking? Authorizing Provider  atorvastatin (LIPITOR) 80 MG tablet Take 1 tablet (80 mg total) by mouth daily. 04/19/16   Janetta Hora, PA-C  Brinzolamide-Brimonidine [facility]) 1-0.2 % SUSP Apply 1 drop to eye 2 (two) times daily.    [provider]  CLARITIN 10 MG CAPS Take 1 capsule by mouth at bedtime. 08/08/19   [provider]  [facility] ACETAMINOPHEN 325 MG CAPS Take 1 capsule by mouth 2 (two) times daily.  08/08/19   [provider]  diltiazem (CARDIZEM) 30 MG tablet Take 1 tablet every 4 hours AS NEEDED for afib heart rate over 100  01/25/23   Nelwyn Salisbury, MD  finasteride (PROSCAR) 5 MG tablet Take 5 mg daily by mouth.    [provider]  furosemide (LASIX) 40 MG tablet Take 1 tablet (40 mg total) by mouth daily. 07/20/23   [name], MD  Latanoprostene Bunod (VYZULTA OP) Apply 1 drop to eye daily.    [provider]  lisinopril (PRINIVIL,ZESTRIL) 10 MG tablet Take 10 mg by mouth at bedtime.     [provider]  melatonin 5 MG TABS Take 5 mg by mouth as needed.    [provider]  metoprolol tartrate (LOPRESSOR) 12.5 mg TABS tablet Take 12.5 mg by mouth 2 (two) times daily.    [provider]  mometasone (NASONEX) 50 MCG/ACT nasal spray Place 2 sprays into both nostrils daily as needed (allergies).    [provider]  Multiple Vitamins-Minerals (CENTRUM SILVER PO) Take 1 tablet by mouth every morning.    [provider]  pantoprazole (PROTONIX) 40 MG tablet Take 1 tablet (40 mg total) by mouth daily. 04/19/16   Janetta Hora, PA-C  potassium chloride (KLOR-CON) 10 MEQ CR tablet Take 10 mEq by mouth every morning.    [provider]  promethazine-dextromethorphan (PROMETHAZINE-DM) 6.25-15 MG/5ML syrup Take 5 mLs by  mouth 4 (four) times daily as needed. 11/26/22   Angelia Kelp, PA-C  tamsulosin (FLOMAX) 0.4 MG CAPS Take 0.4 mg by mouth 2 (two) times daily.     [provider]  TOLAK 4 % CREA Apply 1 application topically daily as needed (rough place on scalp). 08/08/19   [provider]  trimethoprim-polymyxin b (POLYTRIM) ophthalmic solution Place 1 drop into the left eye every 4 (four) hours. 11/26/22   Angelia Kelp, PA-C  warfarin (COUMADIN) 5 MG tablet TAKE 1-2 TABLETS BY MOUTH DAILY AS DIRECTED BY THE [facility] 09/21/23   Eilleen Grates, MD      Allergies    Sulfa antibiotics and Sulfamethoxazole    Review of Systems   Review of Systems A 10 point review of systems was performed and is negative  unless otherwise reported in HPI.  Physical Exam Updated Vital Signs BP (!) 162/73 (BP Location: Right Arm)   Pulse 80   Temp 98.1 F (36.7 C)   Resp 16   Ht 5\' 11"  (1.803 m)   Wt 88.5 kg   SpO2 98%   BMI 27.20 kg/m  Physical Exam General: Normal appearing {Desc; male/male:11659}, lying in bed.  HEENT: PERRLA, Sclera anicteric, MMM, trachea midline.  Cardiology: RRR, no murmurs/rubs/gallops. BL radial and DP pulses equal bilaterally.  Resp: Normal respiratory rate and effort. CTAB, no wheezes, rhonchi, crackles.  Abd: Soft, non-tender, non-distended. No rebound tenderness or guarding.  GU: Deferred. MSK: No peripheral edema or signs of trauma. Extremities without deformity or TTP. No cyanosis or clubbing. Skin: warm, dry. No rashes or lesions. Back: No CVA tenderness Neuro: A&Ox4, CNs II-XII grossly intact. MAEs. Sensation grossly intact.  Psych: Normal mood and affect.   ED Results / Procedures / Treatments   Labs (all labs ordered are listed, but only abnormal results are displayed) Labs Reviewed - No data to display  EKG None  Radiology No results found.  Procedures Procedures  {Document cardiac monitor, telemetry assessment procedure when appropriate:1}  Medications Ordered in ED Medications - No data to display  ED Course/ Medical Decision Making/ A&P                          Medical Decision Making Amount and/or Complexity of Data Reviewed Labs: ordered. Radiology: ordered.    This patient presents to the ED for concern of ***, this involves an extensive number of treatment options, and is a complaint that carries with it a high risk of complications and morbidity.  I considered the following differential and admission for this acute, potentially life threatening condition.   MDM:    ***     Labs: I Ordered, and personally interpreted labs.  The pertinent results include:  ***  Imaging Studies ordered: I ordered imaging studies including  *** I independently visualized and interpreted imaging. I agree with the radiologist interpretation  Additional history obtained from ***.  External records from outside source obtained and reviewed including ***  Cardiac Monitoring: .The patient was maintained on a cardiac monitor.  I personally viewed and interpreted the cardiac monitored which showed an underlying rhythm of: ***  Reevaluation: After the interventions noted above, I reevaluated the patient and found that they have :{resolved/improved/worsened:23923::"improved"}  Social Determinants of Health: .***  Disposition:  ***  Co morbidities that complicate the patient evaluation . Past Medical History:  Diagnosis Date  . Coronary artery disease    a. LHC on 04/06/16 which revealed  40% occl pRCA, 85% occl D1, 75% occl Ramus, 65% occl mLCx, 45% occl pLAD, 90% occl pLAD and nl LV function. s/p PCI/DES to prox LAD. Medical therapy for diag disease.    Aaron Aas GERD (gastroesophageal reflux disease)   . Glaucoma    sees Dr. Gennie Kicks  . Hemorrhoids    internal  . HTN (hypertension)   . Hx of colonic polyps    tubular adenoma   . Hyperlipidemia   . Kidney stone   . Melanoma of cheek (HCC)   . Nephrolithiasis   . Pancolonic diverticulosis   . Paroxysmal atrial fibrillation (HCC)    a. s/p ablation 12/2014: on coumadin     Medicines No orders of the defined types were placed in this encounter.   I have reviewed the patients home medicines and have made adjustments as needed  Problem List / ED Course: Problem List Items Addressed This Visit   None        {Document critical care time when appropriate:1} {Document review of labs and clinical decision tools ie heart score, Chads2Vasc2 etc:1}  {Document your independent review of radiology images, and any outside records:1} {Document your discussion with family members, caretakers, and with consultants:1} {Document social determinants of health affecting pt's  care:1} {Document your decision making why or why not admission, treatments were needed:1}  This note was created using dictation software, which may contain spelling or grammatical errors.

## 2024-02-20 NOTE — ED Triage Notes (Signed)
 Pt reports he fell in the shower- his left foot slipped. Dried blood noted to left ear and bandage present to to left hand. Pt takes coumadin. Denies LOC

## 2024-02-21 ENCOUNTER — Telehealth: Payer: Self-pay

## 2024-02-21 NOTE — Transitions of Care (Post Inpatient/ED Visit) (Signed)
 02/21/2024  Name: [name] [name] MRN: [id number] DOB: Dec 25, 1928  Today's TOC FU Call Status: Today's TOC FU Call Status:: Successful TOC FU Call Completed TOC FU Call Complete Date: 02/21/24 Patient's Name and Date of Birth confirmed.  Transition Care Management Follow-up Telephone Call Date of Discharge: 02/20/24 Discharge Facility: [facility] Type of Discharge: Emergency Department Reason for ED Visit: Other: (fall) How have you been since you were released from the hospital?: Better Any questions or concerns?: No  Items Reviewed: Did you receive and understand the discharge instructions provided?: Yes Medications obtained,verified, and reconciled?: Yes (Medications Reviewed) Any new allergies since your discharge?: No Dietary orders reviewed?: Yes Do you have support at home?: No  Medications Reviewed Today: Medications Reviewed Today     Reviewed by Karena Addison, LPN (Licensed Practical Nurse) on 02/21/24 at 1134  Med List Status: <None>   Medication Order Taking? Sig Documenting Provider Last Dose Status Informant  atorvastatin (LIPITOR) 80 MG tablet [id number] No Take 1 tablet (80 mg total) by mouth daily. Janetta Hora, PA-C Taking Active Self  Brinzolamide-Brimonidine [facility]) 1-0.2 % SUSP [id number] No Apply 1 drop to eye 2 (two) times daily. [provider] Taking Active   CLARITIN 10 MG CAPS [id number] No Take 1 capsule by mouth at bedtime. [provider] Taking Active Self  [employer] ACETAMINOPHEN 325 MG CAPS [id number] No Take 1 capsule by mouth 2 (two) times daily.  [provider] Taking Active Self  diltiazem (CARDIZEM) 30 MG tablet [id number] No Take 1 tablet every 4 hours AS NEEDED for afib heart rate over 100 Nelwyn Salisbury, MD Taking Active   finasteride (PROSCAR) 5 MG tablet [id number] No Take 5 mg daily by mouth. [provider] Taking Active Self  furosemide (LASIX) 40 MG tablet [id number] No Take 1 tablet  (40 mg total) by mouth daily. [name], MD Taking Active   Latanoprostene Bunod [facility]) [id number] No Apply 1 drop to eye daily. [provider] Taking Active   lisinopril (PRINIVIL,ZESTRIL) 10 MG tablet [id number] No Take 10 mg by mouth at bedtime.  [provider] Taking Active Self  melatonin 5 MG TABS [id number] No Take 5 mg by mouth as needed. [provider] Taking Active   metoprolol tartrate (LOPRESSOR) 12.5 mg TABS tablet [id number] No Take 12.5 mg by mouth 2 (two) times daily. [provider] Taking Active Self  mometasone (NASONEX) 50 MCG/ACT nasal spray [id number] No Place 2 sprays into both nostrils daily as needed (allergies). [provider] Taking Active Self  Multiple Vitamins-Minerals (CENTRUM SILVER PO) [id number] No Take 1 tablet by mouth every morning. [provider] Taking Active Self  pantoprazole (PROTONIX) 40 MG tablet [id number] No Take 1 tablet (40 mg total) by mouth daily. Janetta Hora, PA-C Taking Active Self  potassium chloride (KLOR-CON) 10 MEQ CR tablet [id number] No Take 10 mEq by mouth every morning. [provider] Taking Active Self  promethazine-dextromethorphan (PROMETHAZINE-DM) 6.25-15 MG/5ML syrup [id number] No Take 5 mLs by mouth 4 (four) times daily as needed. Margaretann Loveless, PA-C Taking Active   tamsulosin (FLOMAX) 0.4 MG CAPS [id number] No Take 0.4 mg by mouth 2 (two) times daily.  [provider] Taking Active Self  TOLAK 4 % CREA [id number] No Apply 1 application topically daily as needed (rough place on scalp). [provider] Taking Active Self  trimethoprim-polymyxin b (POLYTRIM) ophthalmic solution [id number] No Place 1 drop into the left eye every 4 (four) hours. Joycelyn Man  M, PA-C Taking Active   warfarin (COUMADIN) 5 MG tablet [id number] No TAKE 1-2 TABLETS BY MOUTH DAILY AS DIRECTED BY THE [facility]MDADIN CLINIC Eilleen Grates, MD Taking Active              Home Care and Equipment/Supplies: Were Home Health Services Ordered?: NA Any new equipment or medical supplies ordered?: NA  Functional Questionnaire: Do you need assistance with bathing/showering or dressing?: No Do you need assistance with meal preparation?: No Do you need assistance with eating?: No Do you have difficulty maintaining continence: No Do you need assistance with getting out of bed/getting out of a chair/moving?: No Do you have difficulty managing or taking your medications?: No  Follow up appointments reviewed: PCP Follow-up appointment confirmed?: No (declined) Specialist Hospital Follow-up appointment confirmed?: NA Do you need transportation to your follow-up appointment?: No Do you understand care options if your condition(s) worsen?: Yes-patient verbalized understanding    SIGNATURE Darrall Ellison, LPN [facility] Nurse Health Advisor Direct Dial [phone]

## 2024-02-28 ENCOUNTER — Ambulatory Visit
Admission: EM | Admit: 2024-02-28 | Discharge: 2024-02-28 | Disposition: A | Discharge status: Home / Self Care | Attending: Family Medicine | Admitting: Family Medicine

## 2024-02-28 ENCOUNTER — Ambulatory Visit (INDEPENDENT_AMBULATORY_CARE_PROVIDER_SITE_OTHER): Admission: EM | Admit: 2024-02-28 | Source: Home / Self Care

## 2024-02-28 ENCOUNTER — Encounter: Payer: Self-pay | Admitting: Emergency Medicine

## 2024-02-28 DIAGNOSIS — Z4802 Encounter for removal of sutures: Secondary | ICD-10-CM

## 2024-02-28 DIAGNOSIS — Z5321 Procedure and treatment not carried out due to patient leaving prior to being seen by health care provider: Secondary | ICD-10-CM

## 2024-02-28 DIAGNOSIS — H00025 Hordeolum internum left lower eyelid: Secondary | ICD-10-CM

## 2024-02-28 NOTE — ED Notes (Signed)
Pt did not answer x 2 

## 2024-02-28 NOTE — ED Triage Notes (Signed)
 Here with Daughter in law Mylinda Asa). "I need my staples removed from my head and my wound checked on the top of my left hand". No fever.

## 2024-02-28 NOTE — ED Triage Notes (Signed)
 Pt presents to have suture removed from head and to have wound on left hand checked. Wound and sutures happen from fall on last Monday.

## 2024-02-28 NOTE — ED Provider Notes (Signed)
 Erroneous encounter   Buena Carmine, NP 02/28/24 1644

## 2024-03-09 DIAGNOSIS — C44219 Basal cell carcinoma of skin of left ear and external auricular canal: Secondary | ICD-10-CM | POA: Diagnosis not present

## 2024-03-20 ENCOUNTER — Ambulatory Visit: Attending: Cardiology | Admitting: *Deleted

## 2024-03-20 DIAGNOSIS — I480 Paroxysmal atrial fibrillation: Secondary | ICD-10-CM | POA: Insufficient documentation

## 2024-03-20 DIAGNOSIS — Z5181 Encounter for therapeutic drug level monitoring: Secondary | ICD-10-CM | POA: Diagnosis not present

## 2024-03-20 LAB — POCT INR: INR: 2.2 (ref 2.0–3.0)

## 2024-03-20 NOTE — Patient Instructions (Addendum)
 Description   Continue taking warfarin 1 tablet daily.  Recheck INR in 6 weeks.  Call with any questions or concerns  [phone]

## 2024-03-26 DIAGNOSIS — Z961 Presence of intraocular lens: Secondary | ICD-10-CM | POA: Diagnosis not present

## 2024-03-26 DIAGNOSIS — H401133 Primary open-angle glaucoma, bilateral, severe stage: Secondary | ICD-10-CM | POA: Diagnosis not present

## 2024-03-26 DIAGNOSIS — Z9889 Other specified postprocedural states: Secondary | ICD-10-CM | POA: Diagnosis not present

## 2024-04-09 ENCOUNTER — Encounter: Admitting: Internal Medicine

## 2024-04-09 NOTE — Progress Notes (Signed)
 This encounter was created in error - please disregard.

## 2024-04-11 ENCOUNTER — Ambulatory Visit: Admitting: Family Medicine

## 2024-04-23 ENCOUNTER — Encounter: Payer: Self-pay | Admitting: Family Medicine

## 2024-04-24 NOTE — Telephone Encounter (Signed)
 Appointment scheduled.

## 2024-04-26 ENCOUNTER — Encounter: Payer: Self-pay | Admitting: Family Medicine

## 2024-04-26 ENCOUNTER — Ambulatory Visit (INDEPENDENT_AMBULATORY_CARE_PROVIDER_SITE_OTHER): Admitting: Family Medicine

## 2024-04-26 VITALS — BP 118/62 | HR 52 | Temp 98.4°F | Wt 193.0 lb

## 2024-04-26 DIAGNOSIS — I480 Paroxysmal atrial fibrillation: Secondary | ICD-10-CM | POA: Diagnosis not present

## 2024-04-26 DIAGNOSIS — M7989 Other specified soft tissue disorders: Secondary | ICD-10-CM | POA: Diagnosis not present

## 2024-04-26 MED ORDER — WARFARIN SODIUM 5 MG PO TABS: ORAL_TABLET | ORAL | 5 refills | Status: AC

## 2024-04-26 MED ORDER — FUROSEMIDE 40 MG PO TABS: 40.0000 mg | ORAL_TABLET | Freq: Every day | ORAL | 3 refills | Status: AC

## 2024-04-26 NOTE — Progress Notes (Signed)
   Subjective:    Patient ID: [name] [name], male    DOB: [birth year], 89 y.o.   MRN: [id number]  HPI Here with his daughter for increased swelling in the left leg recently. At times it becomes mildly painful, and yesterday it was draining clear fluid out of an area above the lateral ankle. Since then the drainage has stopped and the leg swelling has improved a bit. No chest pain or SOB. He is on chronic Warfarin therapy, and his INR on 02-20-24 was 2.4. we saw him for this swelling in March of 2024, and we sent him for a venous doppler of the leg. This was performed on 01-28-23, and [name] [name] was told verbally by the physician that everything looked okay. Unfortunately the written report was scanned into Gonsalo's chart before I got a look at it. Today as I read the report it described an acute non-occlusive DVT in the left femoral vein.    Review of Systems  Constitutional: Negative.   Respiratory: Negative.    Cardiovascular:  Positive for leg swelling. Negative for chest pain and palpitations.       Objective:   Physical Exam Constitutional:      General: He is not in acute distress.    Comments: Walks with a cane.    Cardiovascular:     Rate and Rhythm: Normal rate and regular rhythm.     Pulses: Normal pulses.     Heart sounds: Normal heart sounds.  Pulmonary:     Effort: Pulmonary effort is normal.     Breath sounds: Normal breath sounds.   Musculoskeletal:     Comments: Left leg has 3+ edema up to the mid thigh. This is not warm or red or tender. The left lower lateral leg has a small scab on it with no visible drainage. The right lower leg has 2+ edema    Neurological:     Mental Status: He is alert.           Assessment & Plan:  Chronic left leg edema which is likely compounded by a DVT. He will keep taking Warfarin as before. We will get another venous US , and we will refer him to [facility]. He may be a candidate for an endovascular procedure. Corita Diego, MD

## 2024-04-27 ENCOUNTER — Ambulatory Visit (HOSPITAL_COMMUNITY)
Admission: RE | Admit: 2024-04-27 | Discharge: 2024-04-27 | Disposition: A | Discharge status: Home / Self Care | Source: Ambulatory Visit | Attending: Family Medicine | Admitting: Family Medicine

## 2024-04-27 ENCOUNTER — Encounter: Payer: Self-pay | Admitting: Family Medicine

## 2024-04-27 ENCOUNTER — Ambulatory Visit: Payer: Self-pay | Admitting: Family Medicine

## 2024-04-27 ENCOUNTER — Other Ambulatory Visit: Payer: Self-pay | Admitting: Family Medicine

## 2024-04-27 DIAGNOSIS — M7989 Other specified soft tissue disorders: Secondary | ICD-10-CM

## 2024-04-27 DIAGNOSIS — I480 Paroxysmal atrial fibrillation: Secondary | ICD-10-CM

## 2024-04-30 NOTE — Progress Notes (Unsigned)
 Patient name: [name] [name] MRN: [id number] DOB: 15-May-1929 Sex: male  REASON FOR CONSULT: Lower extremity edema with weeping  HPI: [name] [name] is a 89 y.o. male, with history coronary artery disease, hypertension, atrial fibrillation who presents for evaluation of lower extremity edema with weeping.  Past Medical History:  Diagnosis Date   Coronary artery disease    a. LHC on 04/06/16 which revealed 40% occl pRCA, 85% occl D1, 75% occl Ramus, 65% occl mLCx, 45% occl pLAD, 90% occl pLAD and nl LV function. s/p PCI/DES to prox LAD. Medical therapy for diag disease.     GERD (gastroesophageal reflux disease)    Glaucoma    sees Dr. Roz   Hemorrhoids    internal   HTN (hypertension)    Hx of colonic polyps    tubular adenoma    Hyperlipidemia    Kidney stone    Melanoma of cheek (HCC)    Nephrolithiasis    Pancolonic diverticulosis    Paroxysmal atrial fibrillation (HCC)    a. s/p ablation 12/2014: on coumadin     Past Surgical History:  Procedure Laterality Date   ATRIAL FIBRILLATION ABLATION N/A 11/12/2014   Procedure: ATRIAL FIBRILLATION ABLATION;  Surgeon: Lynwood Rakers, MD;  Location: [facility] CATH LAB;  Service: Cardiovascular;  Laterality: N/A;   BLADDER SURGERY     ruptured blood vessel in the bladder 3 weeks S/P ureter stones removed   CARDIAC CATHETERIZATION N/A 04/06/2016   Procedure: Left Heart Cath and Coronary Angiography;  Surgeon: Victory LELON Sharps, MD;  Location: [facility] INVASIVE CV LAB;  Service: Cardiovascular;  Laterality: N/A;   CARDIAC CATHETERIZATION N/A 04/06/2016   Procedure: Coronary Stent Intervention;  Surgeon: Victory LELON Sharps, MD;  Location: [facility] INVASIVE CV LAB;  Service: Cardiovascular;  Laterality: N/A;   CATARACT EXTRACTION W/ INTRAOCULAR LENS  IMPLANT, BILATERAL Bilateral    COLONOSCOPY  06-29-12   per Dr. Avram, adenomatous polyps, repeat in 3 yrs    CORONARY ANGIOPLASTY WITH STENT PLACEMENT  04/06/2016   1 stent   CYSTOSCOPY/RETROGRADE/URETEROSCOPY/STONE  EXTRACTION WITH BASKET Left    removed 2 stones from left ureter   INGUINAL HERNIA REPAIR Right 04/1990   Dr. Velinda Moats   LAPAROSCOPIC CHOLECYSTECTOMY  [phone]   Dr. Velinda Moats   MELANOMA EXCISION Right    @ cheek near ear   TEE WITHOUT CARDIOVERSION N/A 11/11/2014   Procedure: TRANSESOPHAGEAL ECHOCARDIOGRAM (TEE);  Surgeon: Wilbert JONELLE Bihari, MD;  Location: [facility] ENDOSCOPY;  Service: Cardiovascular;  Laterality: N/A;  needs INR before case   VASECTOMY      Family History  Problem Relation Age of Onset   Heart attack Father 5   Heart disease Father    Ovarian cancer Mother     SOCIAL HISTORY: Social History   Socioeconomic History   Marital status: Widowed    Spouse name: Not on file   Number of children: 2   Years of education: Not on file   Highest education level: Bachelor's degree (e.g., BA, AB, BS)  Occupational History   Occupation: Retired  Tobacco Use   Smoking status: Never   Smokeless tobacco: Never  Vaping Use   Vaping status: Never Used  Substance and Sexual Activity   Alcohol use: No    Alcohol/week: 0.0 standard drinks of alcohol   Drug use: No   Sexual activity: Not Currently  Other Topics Concern   Not on file  Social History Narrative   Not on file   Social Drivers of  Health   Financial Resource Strain: Low Risk  (04/25/2024)   Overall Financial Resource Strain (CARDIA)    Difficulty of Paying Living Expenses: Not hard at all  Food Insecurity: No Food Insecurity (04/25/2024)   Hunger Vital Sign    Worried About Running Out of Food in the Last Year: Never true    Ran Out of Food in the Last Year: Never true  Transportation Needs: Unknown (04/25/2024)   PRAPARE - Administrator, Civil Service (Medical): Not on file    Lack of Transportation (Non-Medical): No  Recent Concern: Transportation Needs - Unmet Transportation Needs (04/09/2024)   PRAPARE - Administrator, Civil Service (Medical): Yes    Lack of Transportation  (Non-Medical): No  Physical Activity: Inactive (04/25/2024)   Exercise Vital Sign    Days of Exercise per Week: 0 days    Minutes of Exercise per Session: Not on file  Stress: No Stress Concern Present (04/25/2024)   [employer] of Occupational Health - Occupational Stress Questionnaire    Feeling of Stress: Not at all  Social Connections: Moderately Isolated (04/25/2024)   Social Connection and Isolation Panel    Frequency of Communication with Friends and Family: More than three times a week    Frequency of Social Gatherings with Friends and Family: Twice a week    Attends Religious Services: Never    Database administrator or Organizations: Yes    Attends Banker Meetings: Never    Marital Status: Widowed  Intimate Partner Violence: Not At Risk (12/21/2023)   Humiliation, Afraid, Rape, and Kick questionnaire    Fear of Current or Ex-Partner: No    Emotionally Abused: No    Physically Abused: No    Sexually Abused: No    Allergies  Allergen Reactions   Sulfa Antibiotics Other (See Comments)    Severe peeling of the skin on the groin area   Sulfamethoxazole Other (See Comments)    REACTION: rash    Current Outpatient Medications  Medication Sig Dispense Refill   ammonium lactate (LAC-HYDRIN) 12 % lotion Apply 1 Application topically as needed.     atorvastatin  (LIPITOR ) 80 MG tablet Take 1 tablet (80 mg total) by mouth daily. 90 tablet 3   Brinzolamide-Brimonidine (SIMBRINZA) 1-0.2 % SUSP Apply 1 drop to eye 2 (two) times daily.     CLARITIN  10 MG CAPS Take 1 capsule by mouth at bedtime.     [facility] ACETAMINOPHEN  325 MG CAPS Take 1 capsule by mouth 2 (two) times daily.      diltiazem  (CARDIZEM ) 30 MG tablet Take 1 tablet every 4 hours AS NEEDED for afib heart rate over 100 45 tablet 5   finasteride  (PROSCAR ) 5 MG tablet Take 5 mg daily by mouth.     furosemide  (LASIX ) 40 MG tablet Take 1 tablet (40 mg total) by mouth daily. 90 tablet 3   Latanoprostene Bunod  (VYZULTA OP) Apply 1 drop to eye daily.     lisinopril  (PRINIVIL ,ZESTRIL ) 10 MG tablet Take 10 mg by mouth at bedtime.      melatonin 5 MG TABS Take 5 mg by mouth as needed.     metoprolol  tartrate (LOPRESSOR ) 12.5 mg TABS tablet Take 12.5 mg by mouth 2 (two) times daily.     mometasone (NASONEX) 50 MCG/ACT nasal spray Place 2 sprays into both nostrils daily as needed (allergies).     Multiple Vitamins-Minerals (CENTRUM SILVER PO) Take 1 tablet by mouth every morning.  pantoprazole  (PROTONIX ) 40 MG tablet Take 1 tablet (40 mg total) by mouth daily. 90 tablet 3   potassium chloride  (KLOR-CON ) 10 MEQ CR tablet Take 10 mEq by mouth every morning.     potassium chloride  SA (KLOR-CON  M) 20 MEQ tablet Take 20 mEq by mouth daily.     promethazine -dextromethorphan (PROMETHAZINE -DM) 6.25-15 MG/5ML syrup Take 5 mLs by mouth 4 (four) times daily as needed. 118 mL 0   tamsulosin  (FLOMAX ) 0.4 MG CAPS Take 0.4 mg by mouth 2 (two) times daily.      tobramycin-dexamethasone (TOBRADEX) ophthalmic solution Place 1 drop into both eyes every 6 (six) hours.     TOLAK  4 % CREA Apply 1 application topically daily as needed (rough place on scalp).     trimethoprim -polymyxin b  (POLYTRIM ) ophthalmic solution Place 1 drop into the left eye every 4 (four) hours. 10 mL 0   warfarin (COUMADIN ) 5 MG tablet TAKE 1-2 TABLETS BY MOUTH DAILY AS DIRECTED BY THE COUMDADIN CLINIC 100 tablet 5   No current facility-administered medications for this visit.    REVIEW OF SYSTEMS:  [X]  denotes positive finding, [ ]  denotes negative finding Cardiac  Comments:  Chest pain or chest pressure: ***   Shortness of breath upon exertion:    Short of breath when lying flat:    Irregular heart rhythm:        Vascular    Pain in calf, thigh, or hip brought on by ambulation:    Pain in feet at night that wakes you up from your sleep:     Blood clot in your veins:    Leg swelling:         Pulmonary    Oxygen at home:    Productive  cough:     Wheezing:         Neurologic    Sudden weakness in arms or legs:     Sudden numbness in arms or legs:     Sudden onset of difficulty speaking or slurred speech:    Temporary loss of vision in one eye:     Problems with dizziness:         Gastrointestinal    Blood in stool:     Vomited blood:         Genitourinary    Burning when urinating:     Blood in urine:        Psychiatric    Major depression:         Hematologic    Bleeding problems:    Problems with blood clotting too easily:        Skin    Rashes or ulcers:        Constitutional    Fever or chills:      PHYSICAL EXAM: There were no vitals filed for this visit.  GENERAL: The patient is a well-nourished male, in no acute distress. The vital signs are documented above. CARDIAC: There is a regular rate and rhythm.  VASCULAR: *** PULMONARY: There is good air exchange bilaterally without wheezing or rales. ABDOMEN: Soft and non-tender with normal pitched bowel sounds.  MUSCULOSKELETAL: There are no major deformities or cyanosis. NEUROLOGIC: No focal weakness or paresthesias are detected. SKIN: There are no ulcers or rashes noted. PSYCHIATRIC: The patient has a normal affect.  DATA:    Lower Venous Reflux Study   Patient Name:  [name] [name]  Date of Exam:   04/27/2024  Medical Rec #: [id number]  Accession #:    [id number]  Date of Birth: 06/08/1929       Patient Gender: M  Patient Age:   4 years  Exam Location:  [address]  Procedure:      VAS US  LOWER EXTREMITY VENOUS REFLUX  Referring Phys: GARNETTE FRY    ---------------------------------------------------------------------------  -----    Indications: Swelling, and f/u DVT, venous and reflux exam combined.    Performing Technologist: Duwaine Hives RVS     Examination Guidelines: A complete evaluation includes B-mode imaging,  spectral  Doppler, color Doppler, and power Doppler as needed of all accessible  portions  of  each vessel. Bilateral testing is considered an integral part of a  complete  examination. Limited examinations for reoccurring indications may be  performed  as noted. The reflux portion of the exam is performed with the patient in  reverse Trendelenburg.  Significant venous reflux is defined as >500 ms in the superficial venous  system, and >1 second in the deep venous system.     +--------------+---------+------+-----------+------------+--------+  LEFT         Reflux NoRefluxReflux TimeDiameter cmsComments                          Yes                                   +--------------+---------+------+-----------+------------+--------+  CFV          no                                              +--------------+---------+------+-----------+------------+--------+  FV mid        no                                              +--------------+---------+------+-----------+------------+--------+  Popliteal    no                                              +--------------+---------+------+-----------+------------+--------+  GSV at SFJ    no                            .73               +--------------+---------+------+-----------+------------+--------+  GSV prox thighno                            .57               +--------------+---------+------+-----------+------------+--------+  GSV mid thigh no                            .34               +--------------+---------+------+-----------+------------+--------+  GSV dist thighno                            .38               +--------------+---------+------+-----------+------------+--------+  GSV at knee             yes    >500 ms      .17               +--------------+---------+------+-----------+------------+--------+  GSV prox calf no                            .35               +--------------+---------+------+-----------+------------+--------+  SSV Pop Fossa  no                            .25               +--------------+---------+------+-----------+------------+--------+  SSV prox calf no                            .24               +--------------+---------+------+-----------+------------+--------+     Summary:  Left:  - No evidence of deep vein thrombosis seen in the left lower extremity.  Venous reflux is noted in the left greater saphenous vein at the knee.    *See table(s) above for measurements and observations.   Electronically signed by Norman Serve on 04/27/2024 at 1:43:40 PM.   Assessment/Plan:  89 y.o. male, with history coronary artery disease, hypertension, atrial fibrillation who presents for evaluation of lower extremity edema with weeping.   Lonni DOROTHA Gaskins, MD [facility] Office: [phone]

## 2024-05-01 ENCOUNTER — Encounter: Payer: Self-pay | Admitting: Vascular Surgery

## 2024-05-01 ENCOUNTER — Ambulatory Visit (HOSPITAL_COMMUNITY)

## 2024-05-01 ENCOUNTER — Ambulatory Visit (INDEPENDENT_AMBULATORY_CARE_PROVIDER_SITE_OTHER)

## 2024-05-01 ENCOUNTER — Ambulatory Visit

## 2024-05-01 ENCOUNTER — Encounter

## 2024-05-01 ENCOUNTER — Ambulatory Visit: Attending: Vascular Surgery | Admitting: Vascular Surgery

## 2024-05-01 VITALS — BP 139/63 | HR 60 | Temp 98.6°F | Resp 18 | Ht 71.0 in | Wt 191.0 lb

## 2024-05-01 DIAGNOSIS — Z5181 Encounter for therapeutic drug level monitoring: Secondary | ICD-10-CM | POA: Insufficient documentation

## 2024-05-01 DIAGNOSIS — I480 Paroxysmal atrial fibrillation: Secondary | ICD-10-CM

## 2024-05-01 DIAGNOSIS — I890 Lymphedema, not elsewhere classified: Secondary | ICD-10-CM | POA: Insufficient documentation

## 2024-05-01 LAB — POCT INR: INR: 2.9 (ref 2.0–3.0)

## 2024-05-01 NOTE — Patient Instructions (Signed)
 Continue taking warfarin 1 tablet daily.  Recheck INR in 6 weeks.  Call with any questions or concerns  [phone]

## 2024-05-01 NOTE — Progress Notes (Signed)
Please see anticoagulation encounter.

## 2024-06-11 DIAGNOSIS — Z961 Presence of intraocular lens: Secondary | ICD-10-CM | POA: Diagnosis not present

## 2024-06-11 DIAGNOSIS — H401133 Primary open-angle glaucoma, bilateral, severe stage: Secondary | ICD-10-CM | POA: Diagnosis not present

## 2024-06-12 ENCOUNTER — Ambulatory Visit: Attending: Cardiology | Admitting: *Deleted

## 2024-06-12 DIAGNOSIS — I480 Paroxysmal atrial fibrillation: Secondary | ICD-10-CM | POA: Insufficient documentation

## 2024-06-12 DIAGNOSIS — Z5181 Encounter for therapeutic drug level monitoring: Secondary | ICD-10-CM | POA: Insufficient documentation

## 2024-06-12 LAB — POCT INR: INR: 3 (ref 2.0–3.0)

## 2024-06-12 NOTE — Patient Instructions (Signed)
 Description   Continue taking warfarin 1 tablet daily.  Recheck INR in 6 weeks.  Call with any questions or concerns  [phone]

## 2024-06-12 NOTE — Progress Notes (Signed)
 INR 3.0; Please see anticoagulation encounter

## 2024-07-12 ENCOUNTER — Encounter: Payer: Self-pay | Admitting: Family Medicine

## 2024-07-13 NOTE — Telephone Encounter (Signed)
 The form is ready

## 2024-07-17 NOTE — Telephone Encounter (Signed)
 Pt has appointment with Dr Johnny this morning, will give pt the form at the visit

## 2024-07-19 NOTE — Telephone Encounter (Signed)
 Pt given the placard form at the visit

## 2024-07-24 ENCOUNTER — Ambulatory Visit: Attending: Cardiology | Admitting: *Deleted

## 2024-07-24 ENCOUNTER — Encounter

## 2024-07-24 DIAGNOSIS — I480 Paroxysmal atrial fibrillation: Secondary | ICD-10-CM | POA: Diagnosis not present

## 2024-07-24 DIAGNOSIS — Z5181 Encounter for therapeutic drug level monitoring: Secondary | ICD-10-CM | POA: Insufficient documentation

## 2024-07-24 LAB — POCT INR: INR: 3.5 — AB (ref 2.0–3.0)

## 2024-07-24 NOTE — Progress Notes (Signed)
  Description   INR-3.5; Do not take any warfarin today then continue taking warfarin 1 tablet daily.  Recheck INR in 2 weeks with MD appt (normally 6 weeks).  Call with any questions or concerns  [phone]

## 2024-07-24 NOTE — Patient Instructions (Addendum)
 Description   INR-3.5; Do not take any warfarin today then continue taking warfarin 1 tablet daily.  Recheck INR in 2 weeks with MD appt (normally 6 weeks).  Call with any questions or concerns  [phone]

## 2024-07-25 ENCOUNTER — Telehealth: Payer: Self-pay | Admitting: Family Medicine

## 2024-07-25 NOTE — Telephone Encounter (Signed)
 Copied from CRM [id number]. Topic: General - Other >> Jul 25, 2024  2:28 PM [place] E wrote: Reason for CRM: Patient stated he does not remember getting a form to fill out for the handicap placard, and questioning the steps he will have to take in order to receive this as soon as possible. Callback number for patient is [phone].

## 2024-07-26 NOTE — Telephone Encounter (Signed)
 FYI Spoke with pt advised that Dr Johnny will complete Placard form and will be mailed to his address on file, voiced understanding

## 2024-07-27 NOTE — Telephone Encounter (Signed)
 The form is ready

## 2024-07-27 NOTE — Telephone Encounter (Signed)
 Spoke with pt requested to mail out the form to his address. Form mailed out on 07/27/24

## 2024-07-30 DIAGNOSIS — N401 Enlarged prostate with lower urinary tract symptoms: Secondary | ICD-10-CM | POA: Diagnosis not present

## 2024-07-30 DIAGNOSIS — R3912 Poor urinary stream: Secondary | ICD-10-CM | POA: Diagnosis not present

## 2024-07-30 DIAGNOSIS — N200 Calculus of kidney: Secondary | ICD-10-CM | POA: Diagnosis not present

## 2024-08-07 DIAGNOSIS — Z23 Encounter for immunization: Secondary | ICD-10-CM | POA: Diagnosis not present

## 2024-08-08 NOTE — Progress Notes (Unsigned)
 Cardiology Office Note:   Date:  08/09/2024  ID:  [name] [name], DOB 1929/08/15, MRN [id number] PCP: Johnny Garnette LABOR, MD  [facility] Providers Cardiologist:  Lynwood Schilling, MD {  History of Present Illness:   [name] [name] is a 89 y.o. male who presents for follow up of PAF.  He was in the ED on late December 2018 with PAF. He saw Dr Kelsie and considered ablation vs amiodarone .  He decided to pursue medical management.  He was in the hospital with PAF in August 2022.  This episode lasted longer than usual.  He did convert subsequently spontaneously.    He returns for follow up.  He has done well.  He still lives by himself.  He is having problems with his vision.  He has chronic lower extremity swelling.  He is not taking his Lasix  40 mg every day.  The patient denies any new symptoms such as chest discomfort, neck or arm discomfort. There has been no new shortness of breath, PND or orthopnea. There have been no reported palpitations, presyncope or syncope.  He has probably had 3 episodes of fibrillation this year with the longest lasting about 9 hours.  He said the rate is controlled he is not particularly symptomatic with this.  It goes away spontaneously.  He tolerates his anticoagulation and prefers to stay on Coumadin .  ROS: As stated in the HPI and negative for all other systems.  Studies Reviewed:    EKG:   EKG Interpretation Date/Time:  Thursday August 09 2024 11:13:00 EDT Ventricular Rate:  54 PR Interval:  158 QRS Duration:  84 QT Interval:  434 QTC Calculation: 411 R Axis:   -12  Text Interpretation: Sinus bradycardia Low voltage QRS When compared with ECG of 26-Dec-2022 09:42, No significant change since last tracing Confirmed by Schilling Lynwood ([id number]) on 08/09/2024 11:42:08 AM     Risk Assessment/Calculations:    CHA2DS2-VASc Score = 4  . This indicates a 4.8% annual risk of stroke. The patient's score is based upon: CHF History: 0 HTN History:  1 Diabetes History: 0 Stroke History: 0 Vascular Disease History: 1 Age Score: 2 Gender Score: 0  Physical Exam:   VS:  BP (!) 168/75 (Patient Position: Sitting, Cuff Size: Normal)   Pulse (!) 54   Ht 5' 11 (1.803 m)   Wt 195 lb (88.5 kg)   SpO2 97%   BMI 27.20 kg/m    Wt Readings from Last 3 Encounters:  08/09/24 195 lb (88.5 kg)  05/01/24 191 lb (86.6 kg)  04/26/24 193 lb (87.5 kg)     GEN: Well nourished, well developed in no acute distress NECK: No JVD; No carotid bruits CARDIAC: Irregular RR, distant heart sounds no murmurs, rubs, gallops RESPIRATORY:  Clear to auscultation without rales, wheezing or rhonchi  ABDOMEN: Soft, non-tender, non-distended EXTREMITIES:  No edema; No deformity   ASSESSMENT AND PLAN:   PAF:     He tolerates anticoagulation.  He has rare  minimally symptomatic paroxysms.  No change in therapy.   He has no new symptoms.  No change in therapy.  CAD:     HTN:  BP is excellent on his well-kept blood pressure diary.  No change in therapy.   CHRONIC DISATOLIC HF:   He does have chronic lower extremity swelling.  If this worsens he can take an extra 20 mg of Lasix  and we could switch him to torsemide but he will let me know.  Follow up with me in 12 months.   Signed, Lynwood Schilling, MD

## 2024-08-09 ENCOUNTER — Ambulatory Visit

## 2024-08-09 ENCOUNTER — Encounter: Payer: Self-pay | Admitting: Cardiology

## 2024-08-09 ENCOUNTER — Ambulatory Visit: Attending: Cardiology | Admitting: Cardiology

## 2024-08-09 VITALS — BP 168/75 | HR 54 | Ht 71.0 in | Wt 195.0 lb

## 2024-08-09 DIAGNOSIS — I5032 Chronic diastolic (congestive) heart failure: Secondary | ICD-10-CM | POA: Diagnosis not present

## 2024-08-09 DIAGNOSIS — Z5181 Encounter for therapeutic drug level monitoring: Secondary | ICD-10-CM | POA: Insufficient documentation

## 2024-08-09 DIAGNOSIS — I10 Essential (primary) hypertension: Secondary | ICD-10-CM | POA: Diagnosis not present

## 2024-08-09 DIAGNOSIS — I480 Paroxysmal atrial fibrillation: Secondary | ICD-10-CM | POA: Diagnosis not present

## 2024-08-09 LAB — POCT INR: INR: 3.4 — AB (ref 2.0–3.0)

## 2024-08-09 NOTE — Patient Instructions (Signed)

## 2024-08-09 NOTE — Patient Instructions (Signed)
 Do not take any warfarin today then Decrease to 1 tablet daily, except 0.5 tablet every Wednesday Recheck INR in 3 weeks with MD appt (normally 6 weeks).  Call with any questions or concerns  [phone]

## 2024-08-09 NOTE — Progress Notes (Signed)
 INR 3.4 Please see anticoagulation encounter  Do not take any warfarin today then Decrease to 1 tablet daily, except 0.5 tablet every Wednesday Recheck INR in 3 weeks with MD appt (normally 6 weeks).  Call with any questions or concerns  [phone]

## 2024-08-30 ENCOUNTER — Encounter

## 2024-08-30 DIAGNOSIS — L218 Other seborrheic dermatitis: Secondary | ICD-10-CM | POA: Diagnosis not present

## 2024-08-30 DIAGNOSIS — L814 Other melanin hyperpigmentation: Secondary | ICD-10-CM | POA: Diagnosis not present

## 2024-08-30 DIAGNOSIS — L821 Other seborrheic keratosis: Secondary | ICD-10-CM | POA: Diagnosis not present

## 2024-08-30 DIAGNOSIS — L570 Actinic keratosis: Secondary | ICD-10-CM | POA: Diagnosis not present

## 2024-08-30 DIAGNOSIS — D225 Melanocytic nevi of trunk: Secondary | ICD-10-CM | POA: Diagnosis not present

## 2024-08-31 ENCOUNTER — Ambulatory Visit: Attending: Cardiology | Admitting: Pharmacist

## 2024-08-31 DIAGNOSIS — Z5181 Encounter for therapeutic drug level monitoring: Secondary | ICD-10-CM | POA: Diagnosis not present

## 2024-08-31 DIAGNOSIS — I480 Paroxysmal atrial fibrillation: Secondary | ICD-10-CM | POA: Diagnosis not present

## 2024-08-31 LAB — POCT INR: INR: 2.8 (ref 2.0–3.0)

## 2024-08-31 NOTE — Patient Instructions (Signed)
 Description   INR 2.8: Continue 1 tablet daily, except 0.5 tablet every Wednesday Recheck INR in 6 weeks  Call with any questions or concerns  [phone]

## 2024-08-31 NOTE — Progress Notes (Signed)
 Description   INR 2.8: Continue 1 tablet daily, except 0.5 tablet every Wednesday Recheck INR in 6 weeks  Call with any questions or concerns  [phone]

## 2024-09-24 ENCOUNTER — Encounter: Payer: Self-pay | Admitting: Family Medicine

## 2024-09-24 ENCOUNTER — Ambulatory Visit (INDEPENDENT_AMBULATORY_CARE_PROVIDER_SITE_OTHER): Admitting: Family Medicine

## 2024-09-24 VITALS — BP 124/68 | HR 52 | Temp 97.8°F | Ht 71.0 in | Wt 195.0 lb

## 2024-09-24 DIAGNOSIS — R739 Hyperglycemia, unspecified: Secondary | ICD-10-CM

## 2024-09-24 DIAGNOSIS — I2510 Atherosclerotic heart disease of native coronary artery without angina pectoris: Secondary | ICD-10-CM

## 2024-09-24 DIAGNOSIS — I480 Paroxysmal atrial fibrillation: Secondary | ICD-10-CM | POA: Diagnosis not present

## 2024-09-24 DIAGNOSIS — H401130 Primary open-angle glaucoma, bilateral, stage unspecified: Secondary | ICD-10-CM

## 2024-09-24 DIAGNOSIS — N401 Enlarged prostate with lower urinary tract symptoms: Secondary | ICD-10-CM

## 2024-09-24 DIAGNOSIS — I10 Essential (primary) hypertension: Secondary | ICD-10-CM | POA: Diagnosis not present

## 2024-09-24 DIAGNOSIS — N138 Other obstructive and reflux uropathy: Secondary | ICD-10-CM

## 2024-09-24 DIAGNOSIS — E782 Mixed hyperlipidemia: Secondary | ICD-10-CM

## 2024-09-24 LAB — CBC WITH DIFFERENTIAL/PLATELET
Basophils Absolute: 0 K/uL (ref 0.0–0.1)
Basophils Relative: 0.6 % (ref 0.0–3.0)
Eosinophils Absolute: 0.3 K/uL (ref 0.0–0.7)
Eosinophils Relative: 4 % (ref 0.0–5.0)
HCT: 39.4 % (ref 39.0–52.0)
Hemoglobin: 13.2 g/dL (ref 13.0–17.0)
Lymphocytes Relative: 25.2 % (ref 12.0–46.0)
Lymphs Abs: 1.8 K/uL (ref 0.7–4.0)
MCHC: 33.6 g/dL (ref 30.0–36.0)
MCV: 98.5 fl (ref 78.0–100.0)
Monocytes Absolute: 0.9 K/uL (ref 0.1–1.0)
Monocytes Relative: 12.8 % — ABNORMAL HIGH (ref 3.0–12.0)
Neutro Abs: 4 K/uL (ref 1.4–7.7)
Neutrophils Relative %: 57.4 % (ref 43.0–77.0)
Platelets: 191 K/uL (ref 150.0–400.0)
RBC: 4 Mil/uL — ABNORMAL LOW (ref 4.22–5.81)
RDW: 13.8 % (ref 11.5–15.5)
WBC: 7 K/uL (ref 4.0–10.5)

## 2024-09-24 LAB — LIPID PANEL
Cholesterol: 139 mg/dL (ref 0–200)
HDL: 44 mg/dL (ref >39.00)
LDL Cholesterol: 70 mg/dL (ref 0–99)
NonHDL: 94.79
Total CHOL/HDL Ratio: 3
Triglycerides: 124 mg/dL (ref 0.0–149.0)
VLDL: 24.8 mg/dL (ref 0.0–40.0)

## 2024-09-24 LAB — HEPATIC FUNCTION PANEL
ALT: 26 U/L (ref 0–53)
AST: 25 U/L (ref 0–37)
Albumin: 4.5 g/dL (ref 3.5–5.2)
Alkaline Phosphatase: 56 U/L (ref 39–117)
Bilirubin, Direct: 0.1 mg/dL (ref 0.0–0.3)
Total Bilirubin: 0.9 mg/dL (ref 0.2–1.2)
Total Protein: 6.5 g/dL (ref 6.0–8.3)

## 2024-09-24 LAB — BASIC METABOLIC PANEL WITH GFR
BUN: 26 mg/dL — ABNORMAL HIGH (ref 6–23)
CO2: 29 meq/L (ref 19–32)
Calcium: 9.1 mg/dL (ref 8.4–10.5)
Chloride: 99 meq/L (ref 96–112)
Creatinine, Ser: 1.03 mg/dL (ref 0.40–1.50)
GFR: 62.74 mL/min (ref >60.00)
Glucose, Bld: 105 mg/dL — ABNORMAL HIGH (ref 70–99)
Potassium: 4.2 meq/L (ref 3.5–5.1)
Sodium: 137 meq/L (ref 135–145)

## 2024-09-24 LAB — TSH: TSH: 2.21 u[IU]/mL (ref 0.35–5.50)

## 2024-09-24 LAB — PSA: PSA: 0.15 ng/mL (ref 0.10–4.00)

## 2024-09-24 NOTE — Progress Notes (Signed)
 Subjective:    Patient ID: [name] [name], male    DOB: [birth year], 89 y.o.   MRN: [id number]  HPI Here with his son Gordy to follow up on issues. His vision has become quite poor due to his glaucoma. He no longer drives. He uses a cane to get around his home and he uses a rollator walker when he leaves the house. He has trouble reading or watching TV. His atrial fibrillation and CAD and HTN are stable. He met with Dr. Lavona on 08-09-24, and he was pleased with how [place] is doing. His appetite is good.    Review of Systems  Constitutional: Negative.   HENT: Negative.    Eyes:  Positive for visual disturbance.  Respiratory: Negative.    Cardiovascular: Negative.   Gastrointestinal: Negative.   Genitourinary: Negative.   Musculoskeletal:  Positive for gait problem.  Skin: Negative.   Psychiatric/Behavioral: Negative.         Objective:   Physical Exam Constitutional:      General: He is not in acute distress.    Appearance: Normal appearance. He is well-developed. He is not diaphoretic.  HENT:     Head: Normocephalic and atraumatic.     Right Ear: External ear normal.     Left Ear: External ear normal.     Nose: Nose normal.     Mouth/Throat:     Pharynx: No oropharyngeal exudate.  Eyes:     General: No scleral icterus.       Right eye: No discharge.        Left eye: No discharge.     Conjunctiva/sclera: Conjunctivae normal.     Pupils: Pupils are equal, round, and reactive to light.  Neck:     Thyroid : No thyromegaly.     Vascular: No JVD.     Trachea: No tracheal deviation.  Cardiovascular:     Rate and Rhythm: Normal rate and regular rhythm.     Heart sounds: Normal heart sounds. No murmur heard.    No friction rub. No gallop.  Pulmonary:     Effort: Pulmonary effort is normal. No respiratory distress.     Breath sounds: Normal breath sounds. No wheezing or rales.  Chest:     Chest wall: No tenderness.  Abdominal:     General: Bowel sounds are normal. There is  no distension.     Palpations: Abdomen is soft. There is no mass.     Tenderness: There is no abdominal tenderness. There is no guarding or rebound.  Genitourinary:    Penis: Normal. No tenderness.      Testes: Normal.  Musculoskeletal:        General: No tenderness. Normal range of motion.     Cervical back: Neck supple.  Lymphadenopathy:     Cervical: No cervical adenopathy.  Skin:    General: Skin is warm and dry.     Coloration: Skin is not pale.     Findings: No erythema or rash.  Neurological:     General: No focal deficit present.     Mental Status: He is alert and oriented to person, place, and time.     Cranial Nerves: No cranial nerve deficit.     Motor: No abnormal muscle tone.     Coordination: Coordination normal.     Deep Tendon Reflexes: Reflexes are normal and symmetric. Reflexes normal.     Comments: He is unsteady on his feet   Psychiatric:  Mood and Affect: Mood normal.        Behavior: Behavior normal.        Thought Content: Thought content normal.        Judgment: Judgment normal.           Assessment & Plan:  His HTN and CAD and artrial fibrillation are stable. He will follow up with his ophthalmologist for the glaucoma. We will get labs today to check lipids, an A1c, etc. I personally spent a total of 35 minutes in the care of the patient today including getting/reviewing separately obtained history, performing a medically appropriate exam/evaluation, placing orders, and coordinating care.  Garnette Olmsted, MD

## 2024-09-25 ENCOUNTER — Ambulatory Visit: Payer: Self-pay | Admitting: Family Medicine

## 2024-09-25 LAB — HEMOGLOBIN A1C: Hgb A1c MFr Bld: 6.2 % (ref 4.6–6.5)

## 2024-10-11 ENCOUNTER — Ambulatory Visit: Attending: Cardiology

## 2024-10-11 DIAGNOSIS — I480 Paroxysmal atrial fibrillation: Secondary | ICD-10-CM | POA: Diagnosis not present

## 2024-10-11 DIAGNOSIS — Z5181 Encounter for therapeutic drug level monitoring: Secondary | ICD-10-CM | POA: Diagnosis not present

## 2024-10-11 LAB — POCT INR: INR: 3.3 — AB (ref 2.0–3.0)

## 2024-10-11 NOTE — Patient Instructions (Signed)
 Take 0.5 tablet tonight only then Continue 1 tablet daily, except 0.5 tablet every Wednesday Recheck INR in 6 weeks  Call with any questions or concerns  [phone]

## 2024-10-11 NOTE — Progress Notes (Signed)
 INR 3.3 Please see anticoagulation encounter Take 0.5 tablet tonight only then Continue 1 tablet daily, except 0.5 tablet every Wednesday Recheck INR in 6 weeks  Call with any questions or concerns  [phone]

## 2024-10-12 ENCOUNTER — Ambulatory Visit

## 2024-10-16 ENCOUNTER — Ambulatory Visit: Admitting: Family Medicine

## 2024-10-16 ENCOUNTER — Ambulatory Visit: Payer: Self-pay

## 2024-10-16 DIAGNOSIS — R051 Acute cough: Secondary | ICD-10-CM | POA: Diagnosis not present

## 2024-10-16 DIAGNOSIS — J00 Acute nasopharyngitis [common cold]: Secondary | ICD-10-CM | POA: Diagnosis not present

## 2024-10-16 DIAGNOSIS — Z6826 Body mass index (BMI) 26.0-26.9, adult: Secondary | ICD-10-CM | POA: Diagnosis not present

## 2024-10-16 NOTE — Telephone Encounter (Signed)
 Message received from [facility] that pt went to [facility] and was covid negative. Would like to cancel request for Paxlovid .

## 2024-10-16 NOTE — Telephone Encounter (Signed)
 Please contact pt at: [phone] as he answers that line.   This RN contacted [facility] to ask if they need to see him in person or virtual prior to contacting patient. Per PCP request, [facility] scheduled him tentatively at 11:15 am. Pt refused appointment multiple times, states he has no transportation. States he is unable to do a telehealth visit due to not having a smart phone.   Patient states he tolerated Paxlovid  well 3-4 years ago, requesting same.  Cancelled scheduled appt and notified [facility], message sent HP.   FYI Only or Action Required?: Action required by provider: clinical question for provider.  Patient was last seen in primary care on 09/24/2024 by Johnny Garnette LABOR, MD.  Called Nurse Triage reporting Covid Positive.  Symptoms began yesterday.  Interventions attempted: Nothing.  Symptoms are: gradually worsening.  Triage Disposition: Discuss With PCP and Callback by Nurse Today (overriding Call PCP Within 24 Hours)  Patient/caregiver understands and will follow disposition?: Yes  Reason for Disposition  [1] HIGH RISK patient (e.g., weak immune system, 65 years and older, obesity with BMI 30 or higher, pregnant, chronic lung disease) AND [2] COVID symptoms (e.g., cough, fever)  (Exceptions: Already seen by PCP and no new or worsening symptoms.)  Answer Assessment - Initial Assessment Questions Pt tested covid positive twice. Symptoms began yesterday.  1. SYMPTOMS: What is your main symptom or concern? (e.g., cough, fever, shortness of breath, muscle aches)     Runny nose and sneezing 2. ONSET: When did the symptoms start?      10/15/24 3. COUGH: Do you have a cough? If Yes, ask: How bad is the cough?       Occasionally 4. FEVER: Do you have a fever? If Yes, ask: What is your temperature, how was it measured, and when did it start?     Denies 5. BREATHING DIFFICULTY: Are you having any difficulty breathing? (e.g., normal; shortness of breath, wheezing, unable to  speak)      Denies  Protocols used: COVID-19 - Diagnosed or Suspected-A-AH Message from [place] [facility] sent at 10/16/2024  9:20 AM EST  Summary: running nose, sneezing &  (+) test for covid   Reason for Triage: running nose, sneezing & (+) test for Covid; requesting medication be sent to the pharmacy

## 2024-10-23 DIAGNOSIS — H401133 Primary open-angle glaucoma, bilateral, severe stage: Secondary | ICD-10-CM | POA: Diagnosis not present

## 2024-10-23 DIAGNOSIS — Z961 Presence of intraocular lens: Secondary | ICD-10-CM | POA: Diagnosis not present

## 2024-11-22 ENCOUNTER — Ambulatory Visit: Attending: Cardiology | Admitting: *Deleted

## 2024-11-22 DIAGNOSIS — I480 Paroxysmal atrial fibrillation: Secondary | ICD-10-CM | POA: Insufficient documentation

## 2024-11-22 DIAGNOSIS — Z5181 Encounter for therapeutic drug level monitoring: Secondary | ICD-10-CM | POA: Diagnosis present

## 2024-11-22 LAB — POCT INR: POC INR: 2.2

## 2024-11-22 NOTE — Patient Instructions (Signed)
 Description   INR 2.2; Continue 1 tablet daily, except 0.5 tablet every Wednesday Recheck INR in 6 weeks  Call with any questions or concerns  [phone]

## 2024-11-22 NOTE — Progress Notes (Signed)
 Lab Results  Component Value Date   INR 2.2 11/22/2024   INR 3.3 (A) 10/11/2024   INR 2.8 08/31/2024    Description   INR 2.2; Continue 1 tablet daily, except 0.5 tablet every Wednesday Recheck INR in 6 weeks  Call with any questions or concerns  [phone]

## 2024-12-26 ENCOUNTER — Ambulatory Visit: Payer: Medicare Other

## 2025-01-03 ENCOUNTER — Ambulatory Visit
